# Patient Record
Sex: Male | Born: 1935 | Race: White | Hispanic: No | Smoking: Former smoker
Health system: Southern US, Community
[De-identification: ages and names within clinical notes are randomized; demographics above are authoritative.]

## PROBLEM LIST (undated history)

## (undated) DIAGNOSIS — E785 Hyperlipidemia, unspecified: Secondary | ICD-10-CM

## (undated) DIAGNOSIS — F419 Anxiety disorder, unspecified: Secondary | ICD-10-CM

## (undated) DIAGNOSIS — Z87442 Personal history of urinary calculi: Secondary | ICD-10-CM

## (undated) DIAGNOSIS — F32A Depression, unspecified: Secondary | ICD-10-CM

## (undated) DIAGNOSIS — I10 Essential (primary) hypertension: Secondary | ICD-10-CM

## (undated) DIAGNOSIS — F329 Major depressive disorder, single episode, unspecified: Secondary | ICD-10-CM

## (undated) DIAGNOSIS — I6529 Occlusion and stenosis of unspecified carotid artery: Secondary | ICD-10-CM

## (undated) HISTORY — PX: ROTATOR CUFF REPAIR: SHX139

## (undated) HISTORY — DX: Major depressive disorder, single episode, unspecified: F32.9

## (undated) HISTORY — DX: Hyperlipidemia, unspecified: E78.5

## (undated) HISTORY — DX: Anxiety disorder, unspecified: F41.9

## (undated) HISTORY — DX: Depression, unspecified: F32.A

## (undated) HISTORY — PX: ELBOW ARTHROSCOPY: SUR87

## (undated) HISTORY — DX: Essential (primary) hypertension: I10

---

## 1998-12-15 ENCOUNTER — Encounter: Payer: Self-pay | Admitting: Emergency Medicine

## 1998-12-15 ENCOUNTER — Emergency Department (HOSPITAL_COMMUNITY): Admission: EM | Admit: 1998-12-15 | Discharge: 1998-12-15 | Payer: Self-pay | Admitting: *Deleted

## 1998-12-17 ENCOUNTER — Encounter: Payer: Self-pay | Admitting: Plastic Surgery

## 1998-12-19 ENCOUNTER — Ambulatory Visit (HOSPITAL_COMMUNITY): Admission: RE | Admit: 1998-12-19 | Discharge: 1998-12-20 | Payer: Self-pay | Admitting: *Deleted

## 1999-05-17 ENCOUNTER — Encounter: Payer: Self-pay | Admitting: Orthopedic Surgery

## 1999-05-17 ENCOUNTER — Encounter: Admission: RE | Admit: 1999-05-17 | Discharge: 1999-05-17 | Payer: Self-pay | Admitting: Orthopedic Surgery

## 1999-07-10 ENCOUNTER — Encounter: Admission: RE | Admit: 1999-07-10 | Discharge: 1999-07-10 | Payer: Self-pay | Admitting: Emergency Medicine

## 1999-07-10 ENCOUNTER — Encounter: Payer: Self-pay | Admitting: Emergency Medicine

## 1999-07-12 ENCOUNTER — Ambulatory Visit (HOSPITAL_COMMUNITY): Admission: RE | Admit: 1999-07-12 | Discharge: 1999-07-12 | Payer: Self-pay | Admitting: Emergency Medicine

## 1999-07-17 ENCOUNTER — Encounter: Payer: Self-pay | Admitting: Emergency Medicine

## 1999-07-17 ENCOUNTER — Encounter: Admission: RE | Admit: 1999-07-17 | Discharge: 1999-07-17 | Payer: Self-pay | Admitting: Emergency Medicine

## 2000-03-07 ENCOUNTER — Emergency Department (HOSPITAL_COMMUNITY): Admission: EM | Admit: 2000-03-07 | Discharge: 2000-03-08 | Payer: Self-pay | Admitting: Emergency Medicine

## 2002-01-10 ENCOUNTER — Ambulatory Visit (HOSPITAL_COMMUNITY): Admission: RE | Admit: 2002-01-10 | Discharge: 2002-01-10 | Payer: Self-pay | Admitting: Emergency Medicine

## 2004-01-04 ENCOUNTER — Ambulatory Visit (HOSPITAL_BASED_OUTPATIENT_CLINIC_OR_DEPARTMENT_OTHER): Admission: RE | Admit: 2004-01-04 | Discharge: 2004-01-04 | Payer: Self-pay | Admitting: Orthopaedic Surgery

## 2004-01-04 ENCOUNTER — Ambulatory Visit (HOSPITAL_COMMUNITY): Admission: RE | Admit: 2004-01-04 | Discharge: 2004-01-04 | Payer: Self-pay | Admitting: Orthopaedic Surgery

## 2004-07-09 ENCOUNTER — Encounter: Admission: RE | Admit: 2004-07-09 | Discharge: 2004-07-09 | Payer: Self-pay | Admitting: Emergency Medicine

## 2004-07-22 ENCOUNTER — Encounter: Admission: RE | Admit: 2004-07-22 | Discharge: 2004-07-22 | Payer: Self-pay | Admitting: Emergency Medicine

## 2004-08-05 ENCOUNTER — Encounter: Admission: RE | Admit: 2004-08-05 | Discharge: 2004-08-05 | Payer: Self-pay | Admitting: Emergency Medicine

## 2004-12-30 ENCOUNTER — Encounter: Admission: RE | Admit: 2004-12-30 | Discharge: 2004-12-30 | Payer: Self-pay | Admitting: Emergency Medicine

## 2005-02-14 HISTORY — PX: INGUINAL HERNIA REPAIR: SUR1180

## 2005-03-06 ENCOUNTER — Encounter (INDEPENDENT_AMBULATORY_CARE_PROVIDER_SITE_OTHER): Payer: Self-pay | Admitting: Specialist

## 2005-03-06 ENCOUNTER — Ambulatory Visit (HOSPITAL_COMMUNITY): Admission: RE | Admit: 2005-03-06 | Discharge: 2005-03-06 | Payer: Self-pay | Admitting: Surgery

## 2005-05-01 ENCOUNTER — Encounter: Admission: RE | Admit: 2005-05-01 | Discharge: 2005-05-01 | Payer: Self-pay | Admitting: Emergency Medicine

## 2007-04-17 ENCOUNTER — Encounter: Admission: RE | Admit: 2007-04-17 | Discharge: 2007-04-17 | Payer: Self-pay | Admitting: Emergency Medicine

## 2007-04-21 ENCOUNTER — Encounter: Admission: RE | Admit: 2007-04-21 | Discharge: 2007-04-21 | Payer: Self-pay | Admitting: Emergency Medicine

## 2007-05-12 ENCOUNTER — Encounter: Admission: RE | Admit: 2007-05-12 | Discharge: 2007-05-12 | Payer: Self-pay | Admitting: Emergency Medicine

## 2007-11-03 ENCOUNTER — Encounter: Admission: RE | Admit: 2007-11-03 | Discharge: 2007-11-03 | Payer: Self-pay | Admitting: Emergency Medicine

## 2007-11-17 ENCOUNTER — Encounter: Admission: RE | Admit: 2007-11-17 | Discharge: 2007-11-17 | Payer: Self-pay | Admitting: Emergency Medicine

## 2007-12-01 ENCOUNTER — Encounter: Admission: RE | Admit: 2007-12-01 | Discharge: 2007-12-01 | Payer: Self-pay | Admitting: Emergency Medicine

## 2007-12-16 ENCOUNTER — Emergency Department (HOSPITAL_COMMUNITY): Admission: EM | Admit: 2007-12-16 | Discharge: 2007-12-16 | Payer: Self-pay | Admitting: Emergency Medicine

## 2008-07-14 ENCOUNTER — Encounter: Admission: RE | Admit: 2008-07-14 | Discharge: 2008-07-14 | Payer: Self-pay | Admitting: Surgery

## 2010-02-07 ENCOUNTER — Ambulatory Visit (HOSPITAL_BASED_OUTPATIENT_CLINIC_OR_DEPARTMENT_OTHER): Admission: RE | Admit: 2010-02-07 | Discharge: 2010-02-07 | Payer: Self-pay | Admitting: Urology

## 2010-02-14 HISTORY — PX: LUNG REMOVAL, PARTIAL: SHX233

## 2010-02-27 ENCOUNTER — Ambulatory Visit: Payer: Self-pay | Admitting: Thoracic Surgery

## 2010-03-04 ENCOUNTER — Ambulatory Visit (HOSPITAL_COMMUNITY): Admission: RE | Admit: 2010-03-04 | Discharge: 2010-03-04 | Payer: Self-pay | Admitting: Thoracic Surgery

## 2010-03-05 ENCOUNTER — Ambulatory Visit (HOSPITAL_COMMUNITY): Admission: RE | Admit: 2010-03-05 | Discharge: 2010-03-05 | Payer: Self-pay | Admitting: Thoracic Surgery

## 2010-03-05 ENCOUNTER — Ambulatory Visit: Payer: Self-pay | Admitting: Thoracic Surgery

## 2010-03-05 ENCOUNTER — Encounter: Admission: RE | Admit: 2010-03-05 | Discharge: 2010-03-05 | Payer: Self-pay | Admitting: Thoracic Surgery

## 2010-03-12 ENCOUNTER — Ambulatory Visit: Payer: Self-pay | Admitting: Thoracic Surgery

## 2010-03-12 ENCOUNTER — Inpatient Hospital Stay (HOSPITAL_COMMUNITY): Admission: RE | Admit: 2010-03-12 | Discharge: 2010-03-18 | Payer: Self-pay | Admitting: Thoracic Surgery

## 2010-03-12 ENCOUNTER — Encounter: Payer: Self-pay | Admitting: Thoracic Surgery

## 2010-03-13 ENCOUNTER — Ambulatory Visit: Payer: Self-pay | Admitting: Infectious Disease

## 2010-03-26 ENCOUNTER — Telehealth: Payer: Self-pay | Admitting: Infectious Disease

## 2010-03-27 ENCOUNTER — Ambulatory Visit: Payer: Self-pay | Admitting: Infectious Disease

## 2010-03-27 DIAGNOSIS — N433 Hydrocele, unspecified: Secondary | ICD-10-CM

## 2010-03-27 DIAGNOSIS — B459 Cryptococcosis, unspecified: Secondary | ICD-10-CM

## 2010-03-27 DIAGNOSIS — I10 Essential (primary) hypertension: Secondary | ICD-10-CM | POA: Insufficient documentation

## 2010-03-27 LAB — CONVERTED CEMR LAB
Alkaline Phosphatase: 75 units/L (ref 39–117)
BUN: 25 mg/dL — ABNORMAL HIGH (ref 6–23)
Basophils Relative: 1 % (ref 0–1)
Eosinophils Absolute: 0.6 10*3/uL (ref 0.0–0.7)
Glucose, Bld: 143 mg/dL — ABNORMAL HIGH (ref 70–99)
Hemoglobin: 11.2 g/dL — ABNORMAL LOW (ref 13.0–17.0)
Lymphs Abs: 1.6 10*3/uL (ref 0.7–4.0)
MCHC: 31.8 g/dL (ref 30.0–36.0)
MCV: 86.5 fL (ref 78.0–100.0)
Monocytes Absolute: 0.7 10*3/uL (ref 0.1–1.0)
Monocytes Relative: 7 % (ref 3–12)
Neutro Abs: 6.7 10*3/uL (ref 1.7–7.7)
RBC: 4.07 M/uL — ABNORMAL LOW (ref 4.22–5.81)
Total Bilirubin: 0.4 mg/dL (ref 0.3–1.2)
WBC: 9.7 10*3/uL (ref 4.0–10.5)

## 2010-03-28 ENCOUNTER — Encounter: Payer: Self-pay | Admitting: Infectious Disease

## 2010-04-02 ENCOUNTER — Ambulatory Visit: Payer: Self-pay | Admitting: Thoracic Surgery

## 2010-04-02 ENCOUNTER — Encounter: Admission: RE | Admit: 2010-04-02 | Discharge: 2010-04-02 | Payer: Self-pay | Admitting: Thoracic Surgery

## 2010-05-13 ENCOUNTER — Encounter: Admission: RE | Admit: 2010-05-13 | Discharge: 2010-05-13 | Payer: Self-pay | Admitting: Thoracic Surgery

## 2010-05-14 ENCOUNTER — Ambulatory Visit: Payer: Self-pay | Admitting: Thoracic Surgery

## 2010-05-20 ENCOUNTER — Encounter: Payer: Self-pay | Admitting: Infectious Disease

## 2010-05-27 ENCOUNTER — Encounter: Payer: Self-pay | Admitting: Infectious Disease

## 2010-05-30 ENCOUNTER — Ambulatory Visit: Payer: Self-pay | Admitting: Infectious Disease

## 2010-05-30 ENCOUNTER — Encounter: Payer: Self-pay | Admitting: Infectious Disease

## 2010-05-30 DIAGNOSIS — N401 Enlarged prostate with lower urinary tract symptoms: Secondary | ICD-10-CM

## 2010-05-30 LAB — CONVERTED CEMR LAB
Albumin: 4.4 g/dL (ref 3.5–5.2)
Alkaline Phosphatase: 67 units/L (ref 39–117)
BUN: 23 mg/dL (ref 6–23)
Basophils Absolute: 0 10*3/uL (ref 0.0–0.1)
Basophils Relative: 0 % (ref 0–1)
CO2: 29 meq/L (ref 19–32)
Glucose, Bld: 152 mg/dL — ABNORMAL HIGH (ref 70–99)
Hemoglobin: 11 g/dL — ABNORMAL LOW (ref 13.0–17.0)
Lymphocytes Relative: 28 % (ref 12–46)
MCHC: 31 g/dL (ref 30.0–36.0)
Monocytes Absolute: 0.7 10*3/uL (ref 0.1–1.0)
Neutro Abs: 4.1 10*3/uL (ref 1.7–7.7)
Neutrophils Relative %: 58 % (ref 43–77)
PSA, Free Pct: 28 (ref >25)
PSA, Free: 0.7 ng/mL
PSA: 2.5 ng/mL (ref <=4.00)
Platelets: 276 10*3/uL (ref 150–400)
Potassium: 4.2 meq/L (ref 3.5–5.3)
RDW: 15 % (ref 11.5–15.5)
Total Protein: 7.4 g/dL (ref 6.0–8.3)

## 2010-06-14 ENCOUNTER — Encounter: Payer: Self-pay | Admitting: Infectious Disease

## 2010-07-06 ENCOUNTER — Encounter: Payer: Self-pay | Admitting: Thoracic Surgery

## 2010-07-09 ENCOUNTER — Encounter
Admission: RE | Admit: 2010-07-09 | Discharge: 2010-07-09 | Payer: Self-pay | Source: Home / Self Care | Attending: Thoracic Surgery | Admitting: Thoracic Surgery

## 2010-07-10 ENCOUNTER — Ambulatory Visit
Admission: RE | Admit: 2010-07-10 | Discharge: 2010-07-10 | Payer: Self-pay | Source: Home / Self Care | Attending: Thoracic Surgery | Admitting: Thoracic Surgery

## 2010-07-10 NOTE — Assessment & Plan Note (Addendum)
OFFICE VISIT  SHILOH, SWOPES DOB:  26-Jan-1934                                        July 10, 2010 CHART #:  [id number]  The patient came today and his chest x-ray is stable.  He is now 5 months since we resected the right upper lobe for histoplasmosis nodule. He is still on medication through Dr. Daiva Eves.  He is doing well overall.  We will release him back to his medical doctor and Dr. Daiva Eves and we will see him again if he has any future problems.  His blood pressure was 154/84, pulse 80, respirations 18, saturations were 98%. He is on Diflucan 200 mg a day.  Ines Bloomer, M.D. Electronically Signed  DPB/MEDQ  D:  07/10/2010  T:  07/10/2010  Job:  [id number]

## 2010-07-18 NOTE — Consult Note (Signed)
Summary: [facility]  [facility]   Imported By: Florinda Marker 06/26/2010 16:10:32  _____________________________________________________________________  External Attachment:    Type:   Image     Comment:   External Document

## 2010-07-18 NOTE — Progress Notes (Signed)
Summary: Problems with meds given at hosp d/c.  Phone Note Call from Patient   Summary of Call: Having problems getting medications given at discharge.  They are expensive.   Please call.  .sign Initial call taken by: Tomasita Morrow RN,  March 26, 2010 10:18 AM  Follow-up for Phone Call        Well, he really needs meds. I called and left a message for him and will try to call him back. He needs antifungal therapy for a year. I could try high dose fluconazole but this would be inferior to itraconazole if this is histoplasma and it would not be much cheaper. He may be getting brand instead of generic. Follow-up by: Acey Lav MD,  March 26, 2010 10:34 AM  Additional Follow-up for Phone Call Additional follow up Details #1::        this poor gentleman just forked out greater than 700 dollars to pay for his itraconazole. Medicare does not appear to have covered ANY of the cost. I have no idea what his pharmacist did, but doesnt appear to have  done his job since no one made appeal to medicare etc. Can we get a case manager to help this gentleman out tomorrow. ONly other way I could see this happening is if he fell into the 'doughtnut hole.". Tammy any case manger in particular you would recommend? Additional Follow-up by: Acey Lav MD,  March 26, 2010 10:46 AM  New Problems: HISTOPLASMOSIS (ICD-115.90)   New Problems: HISTOPLASMOSIS (ICD-115.90) New/Updated Medications: ITRACONAZOLE 100 MG CAPS (ITRACONAZOLE) take two tablets twice daily until instructed by Dr. Daiva Eves Prescriptions: ITRACONAZOLE 100 MG CAPS (ITRACONAZOLE) take two tablets twice daily until instructed by Dr. Daiva Eves  #120 x 11   Entered and Authorized by:   Acey Lav MD   Signed by:   Paulette Blanch Dam MD on 03/26/2010   Method used:   Print then Give to Patient   RxID:   [id number]

## 2010-07-18 NOTE — Miscellaneous (Signed)
Summary: HIPAA Restrictions  HIPAA Restrictions   Imported By: Florinda Marker 03/28/2010 09:03:49  _____________________________________________________________________  External Attachment:    Type:   Image     Comment:   External Document

## 2010-07-18 NOTE — Assessment & Plan Note (Signed)
Summary: hsfu/need chart/histoplasmosis   Visit Type:  Follow-up Referring Samie Reasons:  Karle Plumber  CC:  hsfu .  History of Present Illness: 77 year old man with PMHx sigfnican for r hypertension and hydrocele.  He underwent surgery by Dr. Annabell Howells and on preoperatiev screening was found to have a  ight apical lung nodule that was   new, compared to examination in 2009.  This was followed by a PET scan which showed a hypermetabolic lesion in the right upper lobe with progressive cavitization concerning forcarcinoma.  There were  additional other smaller pulmonary nodules that had been seen on a CT   scan as well.  Pt underwent VATS by Dr. Edwyna Shell who encountered what appeared to be lung abscess in the RUL, he resected this area and 2nd nodule. On pathology no malignancy found but instead necrotizing granulomatous inflammtion with yeast seen on pathology thought to be highly consistent with histoplasmosis (they were going to preserve slides for future reference). Pt was started on itraconazole 200mg  three times a day x3 days and then intraconzole 200mg  two times a day. Unfortunately he never enrolled in [employer] plan and paid out of pocket $800 dollars for one month supply of itraconazole. 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 He has had alll chest tubes removed and feels much better. He still has stiches ans asks for these to be removed. I spent greater than 45 minutes on this pt including greater than 50% of time face to face counselling the pt and coordinating care.   Problems Prior to Update: 1)  Histoplasmosis  (ICD-115.90)  Medications Prior to Update: 1)  Itraconazole 100 Mg Caps (Itraconazole) .... Take Two Tablets Twice Daily Until Instructed By Dr. Daiva Eves  Current Medications (verified): 1)  Itraconazole 100 Mg Caps (Itraconazole) .... Take Two Tablets Twice Daily Until Instructed By Dr. Daiva Eves 2)  Prinivil 20 Mg Tabs  (Lisinopril) .... Take Two Tablets Daily 3)  Hydrochlorothiazide 25 Mg Tabs (Hydrochlorothiazide) .... One Half Tablet Once Daily 4)  Fluconazole 200 Mg Tabs (Fluconazole) .... Take Two Tablets Once Daily  Allergies (verified): No Known Drug Allergies    Current Allergies (reviewed today): No known allergies  Past History:  Past Medical History:  Hypertension.   Hydrocele Crytpococcal lung abscess     Past Surgical History: :  Resection of the hydrocele on VATS recently.   Social History:   Married 2nd wife. Five children.  Retired IT sales professional.  No   alcohol.  Quit smoking in 1965.   Review of Systems  The patient denies anorexia, fever, weight loss, weight gain, vision loss, decreased hearing, hoarseness, chest pain, syncope, dyspnea on exertion, peripheral edema, prolonged cough, headaches, hemoptysis, abdominal pain, melena, hematochezia, severe indigestion/heartburn, hematuria, incontinence, genital sores, muscle weakness, suspicious skin lesions, transient blindness, difficulty walking, depression, unusual weight change, abnormal bleeding, and enlarged lymph nodes.    Vital Signs:  Patient profile:   77 year old male Height:      70 inches (177.80 cm) Weight:      178.50 pounds (81.14 kg) BMI:     25.70 Pulse rate:   89 / minute BP sitting:   145 / 78  (left arm)  Vitals Entered By: Starleen Arms CMA (March 27, 2010 3:43 PM) CC: hsfu  Is Patient Diabetic? No Pain Assessment Patient in pain? no      Nutritional Status BMI of 25 - 29 = overweight Nutritional Status Detail nl  Does patient need assistance? Functional Status Self care Ambulation Normal   Physical Exam  General:  alert, well-developed, and well-hydrated.   Head:  normocephalic and atraumatic.   Eyes:  vision grossly intact, pupils equal, pupils round, and pupils reactive to light.   Ears:  no external deformities.   Nose:  no external deformity and no external erythema.   Mouth:   pharynx pink and moist, no erythema, and no exudates.   Neck:  supple and full ROM.   Lungs:  normal respiratory effort.  diminished breath sounds at the right base Heart:  normal rate, regular rhythm, no murmur, and no gallop.   Abdomen:  soft, non-tender, and no distention.   Msk:  normal ROM and no joint deformities.   Extremities:  trace left pedal edema and trace right pedal edema.   Neurologic:  alert & oriented X3 and gait normal.   Skin:  pts srugical sites cdi, sutures removed from two of these Psych:  Oriented X3, memory intact for recent and remote, and normally interactive.     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 Rtc in December, check lfts, cbc and crypto ag serum. Orders: T-CMP with Estimated GFR ([id number]) T-CBC w/Diff ([id number]) T- * Misc. Laboratory test [phone]) Est. Patient Level V (62952)  Problem # 2:  ESSENTIAL HYPERTENSION (ICD-401.9)  resaonble control His updated medication list for this problem includes:    Prinivil 20 Mg Tabs (Lisinopril) .Marland Kitchen... Take two tablets daily    Hydrochlorothiazide 25 Mg Tabs (Hydrochlorothiazide) ..... One half tablet once daily  Orders: Est. Patient Level V (84132)  Problem # 3:  HYDROCELE (ICD-603.9)  sp surgery by Dr. Annabell Howells  Orders: Est. Patient Level V [phone])  Medications Added to Medication List This Visit: 1)  Prinivil 20 Mg Tabs (Lisinopril) .... Take two tablets daily 2)  Hydrochlorothiazide 25 Mg Tabs (Hydrochlorothiazide) .... One half tablet once daily 3)  Fluconazole 200 Mg Tabs (Fluconazole) .... Take two tablets once daily  Patient Instructions: 1)  Please schedule a follow-up appointment in December. Prescriptions: FLUCONAZOLE 200 MG TABS (FLUCONAZOLE) take two tablets once daily  #60 x 11   Entered and  Authorized by:   Acey Lav MD   Signed by:   Paulette Blanch Dam MD on 03/27/2010   Method used:   Print then Give to Patient   RxID:   [id number]   Appended Document: Orders Update    Clinical Lists Changes  Medications: Removed medication of ITRACONAZOLE 100 MG CAPS (ITRACONAZOLE) take two tablets twice daily until instructed by Dr. Daiva Eves

## 2010-07-18 NOTE — Consult Note (Signed)
Summary: [facility]  [facility]   Imported By: Florinda Marker 06/04/2010 09:24:46  _____________________________________________________________________  External Attachment:    Type:   Image     Comment:   External Document

## 2010-07-18 NOTE — Assessment & Plan Note (Signed)
Summary: 68month f/u/vs   Visit Type:  Follow-up Referring Provider:  Karle Plumber  CC:  follow-up visit.  History of Present Illness: 77 year old man with PMHx sigfnican for r hypertension and hydrocele.  He underwent surgery by Dr. Annabell Howells and on preoperatiev screening was found to have a  ight apical lung nodule that was   new, compared to examination in 2009.  This was followed by a PET scan which showed a hypermetabolic lesion in the right upper lobe with progressive cavitization concerning forcarcinoma.  There were  additional other smaller pulmonary nodules that had been seen on a CT   scan as well.  Pt underwent VATS by Dr. Edwyna Shell who encountered what appeared to be lung abscess in the RUL, he resected this area and 2nd nodule. On pathology no malignancy found but instead necrotizing granulomatous inflammtion with yeast seen on pathology thought to be highly consistent with histoplasmosis--in actual fact he grew cryptococcus. I changed him to fluconazole at the last appt. He has done well. He did have more coughing than usual in the past few weeks. He adn his  wife attribute this to a "Himalayan salt candle" She bought for him to improve his breathing.This has abateed recently. He is without fevers chills or systemic symptoms. He ahs noticed diminished appetite sicne starting the fluconazole Dr> Annabell Howells  Problems Prior to Update: 1)  Hydrocele  (ICD-603.9) 2)  Essential Hypertension  (ICD-401.9) 3)  Cryptococcosis  (ICD-117.5)  Medications Prior to Update: 1)  Prinivil 20 Mg Tabs (Lisinopril) .... Take Two Tablets Daily 2)  Hydrochlorothiazide 25 Mg Tabs (Hydrochlorothiazide) .... One Half Tablet Once Daily 3)  Fluconazole 200 Mg Tabs (Fluconazole) .... Take Two Tablets Once Daily  Current Medications (verified): 1)  Prinivil 20 Mg Tabs (Lisinopril) .... Take Two Tablets Daily 2)  Hydrochlorothiazide 25 Mg Tabs (Hydrochlorothiazide) .... One Half Tablet Once Daily 3)  Fluconazole 200  Mg Tabs (Fluconazole) .... Take Two Tablets Once Daily  Allergies (verified): No Known Drug Allergies   Preventive Screening-Counseling & Management  Alcohol-Tobacco     Alcohol drinks/day: 0     Smoking Status: never  Caffeine-Diet-Exercise     Caffeine use/day: coffee, soda 2 per day     Does Patient Exercise: no     Type of exercise: at work  [employer] Use: yes   Current Allergies (reviewed today): No known allergies  Past History:  Past Medical History: Last updated: 03/27/2010  Hypertension.   Hydrocele Crytpococcal lung abscess     Past Surgical History: Last updated: 03/27/2010 :  Resection of the hydrocele on VATS recently.   Social History: Last updated: 03/27/2010   Married 2nd wife. Five children.  Retired IT sales professional.  No   alcohol.  Quit smoking in 1965.   Risk Factors: Alcohol Use: 0 (05/30/2010) Caffeine Use: coffee, soda 2 per day (05/30/2010) Exercise: no (05/30/2010)  Risk Factors: Smoking Status: never (05/30/2010)  Family History: Reviewed history and no changes required.  Social History: Reviewed history from 03/27/2010 and no changes required.   Married 2nd wife. Five children.  Retired IT sales professional.  No   alcohol.  Quit smoking in 1965.   Review of Systems  The patient denies anorexia, fever, weight loss, weight gain, vision loss, decreased hearing, hoarseness, chest pain, syncope, dyspnea on exertion, peripheral edema, prolonged cough, headaches, hemoptysis, abdominal pain, melena, hematochezia, severe indigestion/heartburn, hematuria, incontinence, genital sores, muscle weakness, suspicious skin lesions, transient blindness, difficulty walking, depression, unusual weight change,  and abnormal bleeding.    Vital Signs:  Patient profile:   77 year old male Height:      70 inches (177.80 cm) Weight:      174.8 pounds (79.45 kg) BMI:     25.17 Temp:     98.3 degrees F (36.83 degrees C) oral Pulse rate:    85 / minute BP sitting:   160 / 78  (right arm)  Vitals Entered By: Wendall Mola CMA Duncan Dull) (May 30, 2010 3:15 PM) CC: follow-up visit Is Patient Diabetic? No Pain Assessment Patient in pain? no      Nutritional Status BMI of 25 - 29 = overweight Nutritional Status Detail appetite "not good"  Have you ever been in a relationship where you felt threatened, hurt or afraid?Unable to ask   Does patient need assistance? Functional Status Self care Ambulation Normal Comments no missed doses of meds per pt.   Physical Exam  General:  alert, well-developed, and well-hydrated.   Head:  normocephalic and atraumatic.   Eyes:  vision grossly intact, pupils equal, pupils round, and pupils reactive to light.   Ears:  no external deformities.   Nose:  no external deformity and no external erythema.   Mouth:  pharynx pink and moist, no erythema, and no exudates.   Neck:  supple and full ROM.   Lungs:  normal respiratory effort.  diminished breath sounds at the right base Heart:  normal rate, regular rhythm, no murmur, and no gallop.   Abdomen:  soft, non-tender, and no distention.   Msk:  normal ROM and no joint deformities.   Extremities:  trace left pedal edema and trace right pedal edema.   Neurologic:  alert & oriented X3 and gait normal.   Skin:  pts srugical sites cdi,and stes are clean Psych:  Oriented X3, memory intact for recent and remote, and normally interactive.     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 Patient Level IV (29562)  Problem # 2:  ESSENTIAL HYPERTENSION (ICD-401.9)  BP up likely dy from bit of white coat htn His updated medication list for this problem includes:    Prinivil 20 Mg Tabs (Lisinopril) .Marland Kitchen... Take two tablets daily    Hydrochlorothiazide 25 Mg Tabs  (Hydrochlorothiazide) ..... One half tablet once daily  His updated medication list for this problem includes:    Prinivil 20 Mg Tabs (Lisinopril) .Marland Kitchen... Take two tablets daily    Hydrochlorothiazide 25 Mg Tabs (Hydrochlorothiazide) ..... One half tablet once daily  Orders: Est. Patient Level IV (13086)  Problem # 3:  BENIGN PROSTATIC HYPERTROPHY, WITH OBSTRUCTION (ICD-600.01) I will check psa and free psa for his Urologist and fax to them Orders: T-PSA Total ([id number]) T-PSA Free ([id number]) Est. Patient Level IV (01027)  Patient Instructions: 1)  rtc in 6 months time

## 2010-07-18 NOTE — Consult Note (Signed)
Summary: [facility]  [facility]   Imported By: Florinda Marker 07/02/2010 12:19:30  _____________________________________________________________________  External Attachment:    Type:   Image     Comment:   External Document

## 2010-08-23 ENCOUNTER — Encounter: Payer: Self-pay | Admitting: Infectious Disease

## 2010-08-29 LAB — AFB CULTURE WITH SMEAR (NOT AT ARMC): Acid Fast Smear: NONE SEEN

## 2010-08-29 LAB — TYPE AND SCREEN

## 2010-08-29 LAB — SURGICAL PCR SCREEN
MRSA, PCR: NEGATIVE
Staphylococcus aureus: POSITIVE — AB

## 2010-08-29 LAB — POCT I-STAT 7, (LYTES, BLD GAS, ICA,H+H)
Bicarbonate: 24.5 mEq/L — ABNORMAL HIGH (ref 20.0–24.0)
HCT: 29 % — ABNORMAL LOW (ref 39.0–52.0)
Hemoglobin: 9.9 g/dL — ABNORMAL LOW (ref 13.0–17.0)
Patient temperature: 35
Sodium: 134 mEq/L — ABNORMAL LOW (ref 135–145)
TCO2: 26 mmol/L (ref 0–100)
pCO2 arterial: 43.7 mmHg (ref 35.0–45.0)
pH, Arterial: 7.346 — ABNORMAL LOW (ref 7.350–7.450)
pO2, Arterial: 416 mmHg — ABNORMAL HIGH (ref 80.0–100.0)

## 2010-08-29 LAB — BASIC METABOLIC PANEL
CO2: 22 mEq/L (ref 19–32)
CO2: 23 mEq/L (ref 19–32)
CO2: 26 mEq/L (ref 19–32)
Calcium: 7.7 mg/dL — ABNORMAL LOW (ref 8.4–10.5)
Calcium: 7.9 mg/dL — ABNORMAL LOW (ref 8.4–10.5)
Chloride: 101 mEq/L (ref 96–112)
Creatinine, Ser: 0.94 mg/dL (ref 0.4–1.5)
Creatinine, Ser: 0.94 mg/dL (ref 0.4–1.5)
Creatinine, Ser: 1.02 mg/dL (ref 0.4–1.5)
GFR calc Af Amer: >60 mL/min (ref >60)
GFR calc Af Amer: >60 mL/min (ref >60)
GFR calc Af Amer: >60 mL/min (ref >60)
GFR calc non Af Amer: >60 mL/min (ref >60)
GFR calc non Af Amer: >60 mL/min (ref >60)
Glucose, Bld: 186 mg/dL — ABNORMAL HIGH (ref 70–99)
Sodium: 135 mEq/L (ref 135–145)
Sodium: 135 mEq/L (ref 135–145)
Sodium: 136 mEq/L (ref 135–145)

## 2010-08-29 LAB — POCT I-STAT 3, ART BLOOD GAS (G3+)
Patient temperature: 98.1
pCO2 arterial: 37.9 mmHg (ref 35.0–45.0)
pH, Arterial: 7.383 (ref 7.350–7.450)

## 2010-08-29 LAB — COMPREHENSIVE METABOLIC PANEL
ALT: 24 U/L (ref 0–53)
ALT: 26 U/L (ref 0–53)
AST: 31 U/L (ref 0–37)
Alkaline Phosphatase: 36 U/L — ABNORMAL LOW (ref 39–117)
CO2: 26 mEq/L (ref 19–32)
Calcium: 7.9 mg/dL — ABNORMAL LOW (ref 8.4–10.5)
Calcium: 9.5 mg/dL (ref 8.4–10.5)
Chloride: 101 mEq/L (ref 96–112)
Creatinine, Ser: 1.07 mg/dL (ref 0.4–1.5)
GFR calc Af Amer: >60 mL/min (ref >60)
GFR calc non Af Amer: >60 mL/min (ref >60)
Glucose, Bld: 173 mg/dL — ABNORMAL HIGH (ref 70–99)
Potassium: 3.9 mEq/L (ref 3.5–5.1)
Sodium: 131 mEq/L — ABNORMAL LOW (ref 135–145)
Sodium: 138 mEq/L (ref 135–145)
Total Bilirubin: 1.1 mg/dL (ref 0.3–1.2)
Total Protein: 6.6 g/dL (ref 6.0–8.3)

## 2010-08-29 LAB — BLOOD GAS, ARTERIAL
FIO2: 0.21 %
Patient temperature: 98.6
TCO2: 24.9 mmol/L (ref 0–100)
pH, Arterial: 7.432 (ref 7.350–7.450)

## 2010-08-29 LAB — GLUCOSE, CAPILLARY
Glucose-Capillary: 116 mg/dL — ABNORMAL HIGH (ref 70–99)
Glucose-Capillary: 132 mg/dL — ABNORMAL HIGH (ref 70–99)
Glucose-Capillary: 137 mg/dL — ABNORMAL HIGH (ref 70–99)
Glucose-Capillary: 139 mg/dL — ABNORMAL HIGH (ref 70–99)
Glucose-Capillary: 141 mg/dL — ABNORMAL HIGH (ref 70–99)
Glucose-Capillary: 146 mg/dL — ABNORMAL HIGH (ref 70–99)
Glucose-Capillary: 146 mg/dL — ABNORMAL HIGH (ref 70–99)
Glucose-Capillary: 148 mg/dL — ABNORMAL HIGH (ref 70–99)
Glucose-Capillary: 154 mg/dL — ABNORMAL HIGH (ref 70–99)
Glucose-Capillary: 163 mg/dL — ABNORMAL HIGH (ref 70–99)
Glucose-Capillary: 168 mg/dL — ABNORMAL HIGH (ref 70–99)
Glucose-Capillary: 170 mg/dL — ABNORMAL HIGH (ref 70–99)
Glucose-Capillary: 195 mg/dL — ABNORMAL HIGH (ref 70–99)
Glucose-Capillary: 224 mg/dL — ABNORMAL HIGH (ref 70–99)

## 2010-08-29 LAB — PROTIME-INR
INR: 0.98 (ref 0.00–1.49)
Prothrombin Time: 13.2 seconds (ref 11.6–15.2)

## 2010-08-29 LAB — CBC
HCT: 32.8 % — ABNORMAL LOW (ref 39.0–52.0)
HCT: 37.1 % — ABNORMAL LOW (ref 39.0–52.0)
Hemoglobin: 10.1 g/dL — ABNORMAL LOW (ref 13.0–17.0)
Hemoglobin: 10.2 g/dL — ABNORMAL LOW (ref 13.0–17.0)
Hemoglobin: 11.9 g/dL — ABNORMAL LOW (ref 13.0–17.0)
MCH: 27.1 pg (ref 26.0–34.0)
MCH: 28.5 pg (ref 26.0–34.0)
MCH: 28.8 pg (ref 26.0–34.0)
MCH: 29.2 pg (ref 26.0–34.0)
MCHC: 32.1 g/dL (ref 30.0–36.0)
MCHC: 34.2 g/dL (ref 30.0–36.0)
MCV: 83 fL (ref 78.0–100.0)
MCV: 85.4 fL (ref 78.0–100.0)
Platelets: 157 10*3/uL (ref 150–400)
Platelets: 159 10*3/uL (ref 150–400)
Platelets: 216 10*3/uL (ref 150–400)
Platelets: 243 10*3/uL (ref 150–400)
RBC: 3.49 MIL/uL — ABNORMAL LOW (ref 4.22–5.81)
RBC: 3.53 MIL/uL — ABNORMAL LOW (ref 4.22–5.81)
RBC: 3.85 MIL/uL — ABNORMAL LOW (ref 4.22–5.81)
RBC: 3.95 MIL/uL — ABNORMAL LOW (ref 4.22–5.81)
RBC: 4.17 MIL/uL — ABNORMAL LOW (ref 4.22–5.81)
RDW: 14.9 % (ref 11.5–15.5)
WBC: 12.6 10*3/uL — ABNORMAL HIGH (ref 4.0–10.5)
WBC: 18.5 10*3/uL — ABNORMAL HIGH (ref 4.0–10.5)
WBC: 24.4 10*3/uL — ABNORMAL HIGH (ref 4.0–10.5)
WBC: 9.2 10*3/uL (ref 4.0–10.5)

## 2010-08-29 LAB — TISSUE CULTURE
Culture: NO GROWTH
Gram Stain: NONE SEEN

## 2010-08-29 LAB — FUNGUS CULTURE W SMEAR

## 2010-08-29 LAB — URINALYSIS, ROUTINE W REFLEX MICROSCOPIC
Bilirubin Urine: NEGATIVE
Ketones, ur: NEGATIVE mg/dL
Nitrite: NEGATIVE
Protein, ur: NEGATIVE mg/dL
Specific Gravity, Urine: 1.021 (ref 1.005–1.030)
Urobilinogen, UA: 0.2 mg/dL (ref 0.0–1.0)

## 2010-08-29 LAB — CULTURE, ROUTINE-ABSCESS: Culture: NO GROWTH

## 2010-08-29 LAB — APTT
aPTT: 26 seconds (ref 24–37)
aPTT: 29 seconds (ref 24–37)

## 2010-08-29 LAB — POCT I-STAT 4, (NA,K, GLUC, HGB,HCT)
Glucose, Bld: 126 mg/dL — ABNORMAL HIGH (ref 70–99)
HCT: 41 % (ref 39.0–52.0)

## 2010-08-29 LAB — HISTOPLASMA ANTIGEN, URINE

## 2010-10-03 ENCOUNTER — Other Ambulatory Visit (INDEPENDENT_AMBULATORY_CARE_PROVIDER_SITE_OTHER): Payer: Medicare Other

## 2010-10-03 ENCOUNTER — Encounter (INDEPENDENT_AMBULATORY_CARE_PROVIDER_SITE_OTHER): Payer: Medicare Other | Admitting: Vascular Surgery

## 2010-10-03 DIAGNOSIS — I6529 Occlusion and stenosis of unspecified carotid artery: Secondary | ICD-10-CM

## 2010-10-04 NOTE — Assessment & Plan Note (Signed)
OFFICE VISIT  Colin West, Colin West DOB:  12-Oct-1933                                       10/03/2010 CHART#:[id number]  CHIEF COMPLAINT:  Carotid stenosis.  HISTORY OF PRESENT ILLNESS:  The patient is a 77 year old male referred by Dr. Johnney Killian for evaluation of carotid stenosis.  The patient denies any symptoms of TIA, amaurosis or stroke.  His carotid stenosis was found on a screening ultrasound.  Chronic medical problems and risk factors include diabetes, hypertension and elevated cholesterol.  These are all currently stable followed by Dr. Johnney Killian.  PAST MEDICAL HISTORY:  Is as listed above.  PAST SURGICAL HISTORY:  Had a lung resection for Cryptococcus in the past.  He has had multiple inguinal hernia repairs, rotator cuff repair and an elbow operation.  FAMILY HISTORY:  He has no relatives with vascular disease at age less than 15.  SOCIAL HISTORY:  He is single, has 5 children.  He is a retired IT sales professional.  He is a nonsmoker, nonconsumer of alcohol.  REVIEW OF SYSTEMS:  Full 12 point review of systems was performed on the patient today.  Height is 5 feet 10 inches, weight is 174 pounds. PSYCHIATRIC:  He has mild anxiety and depression. All other systems were negative.  MEDICATIONS:  Lisinopril, lovastatin, aspirin, multivitamin, Fish Oil, vitamin D.  ALLERGIES:  He has no known drug allergies.  PHYSICAL EXAM:  Vital signs:  Blood pressure is 145/74 in the left arm, 150/74 in the right arm, heart rate is 74 and regular.  HEENT: Unremarkable.  Neck:  Has 2+ carotid pulses without bruit.  Chest: Clear to auscultation.  Cardiac:  Regular rate and rhythm without murmur.  Abdomen:  Soft, nontender, nondistended.  No masses. Extremities:  He has 2+ radial and femoral pulses bilaterally. Musculoskeletal:  Shows no major obvious joint deformities.  Neurologic: Shows symmetric upper extremity and lower extremity motor strength which is  5/5.  Skin:  Has no open ulcers or rashes.  He had a carotid duplex exam performed today which showed bilateral 40%- 60% stenosis.  In summary, the patient has bilateral moderate carotid stenosis which is currently asymptomatic.  I discussed with the patient today the pathophysiology of carotid disease and discussed with him that he would potentially need carotid endarterectomy if the stenosis ever progresses to greater than 80% or becomes symptomatic and I discussed the symptoms with him.  Otherwise he will have continued followup with medical management with risk factor modification, aspirin therapy and we will repeat his carotid duplex in 6 months' time.    Janetta Hora. Fields, MD Electronically Signed  CEF/MEDQ  D:  10/03/2010  T:  10/04/2010  Job:  [id number]  cc:   Dr Johnney Killian

## 2010-10-10 NOTE — Procedures (Unsigned)
CAROTID DUPLEX EXAM  INDICATION:  Carotid artery disease.  HISTORY: Diabetes:  No. Cardiac:  No. Hypertension:  Yes. Smoking:  No. Previous Surgery:  No. CV History:  Patient is currently asymptomatic. Amaurosis Fugax No, Paresthesias No, Hemiparesis No.                                      RIGHT             LEFT Brachial systolic pressure:         120               128 Brachial Doppler waveforms:         WNL               WNL Vertebral direction of flow:        Antegrade         Atypical antegrade DUPLEX VELOCITIES (cm/sec) CCA peak systolic                   110               109 ECA peak systolic                   107               107 ICA peak systolic                   160               157 ICA end diastolic                   33                38 PLAQUE MORPHOLOGY:                  Heterogenous      Heterogenous PLAQUE AMOUNT:                      Moderate          Moderate PLAQUE LOCATION:                    CCA, ICA, ECA     CCA, ICA, ECA  IMPRESSION: 1. Bilateral 40% to 59% stenosis within the internal carotid arteries. 2. Bilateral intimal thickening within the common carotid arteries. 3. Atypical left vertebral with decreased endoscopic velocities     suggestive of stenosis.  The right vertebral is prominent.  ___________________________________________ Janetta Hora. Fields, MD  OD/MEDQ  D:  10/03/2010  T:  10/03/2010  Job:  [id number]

## 2010-10-29 NOTE — Letter (Signed)
March 05, 2010   Colin West. Colin Howells, MD  [address]  [place], [place] [address]   Re:  Colin West, Colin West               DOB:  1933-06-22   Dear Colin West,   I saw the patient back today and he got a PET scan today but they do not  have any official reading on it but __________ that it is obviously  positive and since this was not there several years ago, this is  probably a new lesion either a cancer or an inflammatory lesion.  I  would favor this being a cancer.  I have recommended that he proceed  with resection of this.  His pulmonary function tests are satisfactory  with an FVC of 3.68, FEV-1 of 2.93, and diffusion capacity of 74%.  I  discussed this with he and his daughter and they agree to surgery.  We  will plan to do a VATS right upper lobectomy on Tuesday, the 27th at  [facility].   I appreciate the opportunity of seeing the patient.    Sincerely,   Ines Bloomer, M.D.  Electronically Signed   DPB/MEDQ  D:  03/05/2010  T:  03/06/2010  Job:  [id number]

## 2010-10-29 NOTE — Letter (Signed)
February 27, 2010   Excell Seltzer. Annabell Howells, MD  [address]  [place], [place] [address]   Re:  Colin West, Colin West               DOB:  14-Jul-1933   Dear Dr. Annabell Howells:   I appreciate the opportunity of seeing the patient.  This patient has  had a history of nephrolithiasis and a recent prostatitis.  He had a  hydrocele and underwent a left hydrocelectomy.  At that time, a chest x-  ray showed a new right upper lobe nodule.  A CT scan was done and the CT  scan revealed a 3-4 cm nodule in the right upper lobe.  Chest x-ray was  done in 2006 for an inguinal hernia repair that showed no lesion.  He  also has some small nodules in the lower lobe on CT scan.  He is  referred here for evaluation.  He quit smoking.  He has had no  hemoptysis, fever, chills, excessive sputum.  He quit smoking in 1965.   MEDICATIONS:  Aspirin 81 mg daily, hydrochlorothiazide, Levitra,  Prinivil.   PAST MEDICAL HISTORY:  He has hypertension.   PAST SURGICAL HISTORY:  Hernia repair with hydrocelectomy.  He has  hypertension.   FAMILY HISTORY:  Noncontributory.   SOCIAL HISTORY:  He is single and has 5 children.  He is retired as a  IT sales professional.  He does not drink alcohol on a regular basis.   REVIEW OF SYSTEMS:  GENERAL:  He is 182 pounds.  He is 5 feet 10 inches.  His weight has been stable.  CARDIAC:  No angina or atrial fibrillation.  PULMONARY:  No hemoptysis.  GI:  No nausea, vomiting, constipation, diarrhea.  GU:  See past medical history.  VASCULAR:  No claudication, DVT, TIAs.  NEUROLOGIC:  No dizziness, headaches, blackouts, or seizures.  MUSCULOSKELETAL:  Arthritis.  PSYCHIATRIC:  No depression or nervousness.  EYE/ENT:  No changes in eyesight or hearing.  HEMATOLOGIC:  No problems with bleeding, clotting disorders, or anemia.   PHYSICAL EXAMINATION:  Vital Signs:  His blood pressure is 154/84, pulse  74, respirations 18, sats are 97%.  Head, Eyes, Ears, Nose, and Throat:  Unremarkable.  Neck:   Supple without thyromegaly.  There is no  supraclavicular or axillary adenopathy.  Chest:  Clear to auscultation  and percussion.  Heart:  Regular sinus rhythm.  No murmurs.  Abdomen:  Soft.  There is no splenomegaly.  Extremities:  Pulses are 2+.  There is  no clubbing or edema.  Neurologic:  He is oriented x3.  Sensory and  motor intact.  Cranial nerves intact.   ASSESSMENT AND PLAN:  Since this lesion was not there 5 years ago, I  think we need to proceed with a full workup, plan to get a CAT scan and  pulmonary function test with PFTs.  We will hold off on a brain scan for  right now.  I will see him back after the PET scan and discuss further  options.   Sincerely,   Ines Bloomer, M.D.  Electronically Signed   DPB/MEDQ  D:  02/27/2010  T:  02/28/2010  Job:  [id number]

## 2010-10-29 NOTE — Assessment & Plan Note (Signed)
OFFICE VISIT   Colin West, Colin West  DOB:  1933-12-09                                        May 14, 2010  CHART #:  [id number]   HISTORY:  The patient is status post VATS resection of right upper lobe  nodule and wedge resection of right lower lobe nodule done on March 12, 2010, by Dr. Edwyna Shell.  He then underwent right thoracotomy for  drainage of hemothorax for postoperative hemorrhage on March 12, 2010.  The patient's cultures grew Cryptococcus and he is being treated  by Dr. Daiva Eves with fluconazole.  The patient was last seen in the  office on April 02, 2010.  He presents today for further followup  visit.  The patient is without complaints.  He denies any pain,  shortness of breath, coughing, hemoptysis, wheezing, fevers, night  sweats, or chills.  He has an appointment to see Dr. Daiva Eves in the  middle of December.  He states that he plans to stay on the fluconazole  per Dr. Daiva Eves for a total of 1 year.  The patient is back at work.  He  is without complaints.   PHYSICAL EXAMINATION:  Vital Signs:  Blood pressure 154/82, pulse of 84,  respirations of 18, and O2 sats 98% on room air.  Respiratory:  Clear to  auscultation bilaterally.  Cardiac:  Regular rate and rhythm.  Incisions  are clean, dry, and intact and healing well.   STUDIES:  The patient had a PA and lateral chest x-ray done today, which  shows persistent elevation of the right hemidiaphragm.  Chest x-ray is  stable.  No pneumothorax noted.  Postsurgical changes noted.   IMPRESSION AND PLAN:  The patient is progressing well following surgery.  He is to continue management per Dr. Daiva Eves.  We will bring the patient  back in 2 months with a  repeat PA and lateral chest x-ray for further management.  The patient  is on the interim, if he has any surgical issues, he is to contact us.  The patient is in agreement.   Sol Blazing, PA   KMD/MEDQ  D:  05/14/2010  T:   05/15/2010  Job:  [id number]   cc:   Excell Seltzer. Annabell Howells, M.D.  Acey Lav, MD

## 2010-10-29 NOTE — Letter (Signed)
April 02, 2010   Excell Seltzer. Annabell Howells, MD  [address]  [place], [place] [address]   Re:  RAHMAN, FERRALL               DOB:  May 23, 1934   Dear Jonny Ruiz,   I saw the patient in the office today after he has had resection of his  right upper lobe lesion that turned out to be cryptococcus.  His  cultures also grew out Cryptococcus and he is being treated by  Infectious Disease.  His blood pressure is 130/81, pulse of 80,  respirations 18, sats were 96%.  I have released him to full activity.  His chest x-ray showed normal postoperative changes.   Ines Bloomer, M.D.  Electronically Signed   DPB/MEDQ  D:  04/02/2010  T:  04/03/2010  Job:  [id number]

## 2010-11-01 NOTE — Op Note (Signed)
NAME:  ARVIL, UTZ                         ACCOUNT NO.:  [id number]   MEDICAL RECORD NO.:  [id number]                   PATIENT TYPE:  AMB   LOCATION:  DSC                                  FACILITY:  [facility]   PHYSICIAN:  Claude Manges. Cleophas Dunker, M.D.            DATE OF BIRTH:  February 01, 1934   DATE OF PROCEDURE:  01/04/2004  DATE OF DISCHARGE:                                 OPERATIVE REPORT   PREOPERATIVE DIAGNOSIS:  Right olecranon bursa with olecranon spurring.   POSTOPERATIVE DIAGNOSIS:  Right olecranon bursa with olecranon spurring.  Rupture of triceps tendon.   OPERATION PERFORMED:  1. Excision of olecranon bursa.  2. Repair of triceps rupture.   SURGEON:  Claude Manges. Cleophas Dunker, M.D.   ANESTHESIA:  IV Xylocaine.   COMPLICATIONS:  None.   INDICATIONS FOR PROCEDURE:  The patient is a 77 year old gentleman who had a  recent injury to his right elbow with establishment of an obvious olecranon  bursa.  Films revealed some spurring of the olecranon that apparently had  fractured.  He has been uncomfortable and wishes to have this excised.   DESCRIPTION OF PROCEDURE:  With the patient comfortable on the operating  table under intravenous sedation, IV Xylocaine was administered by  anesthesia with a proximal single tourniquet to the right upper extremity.  The right arm was then prepped with DuraPrep from the tips of the fingers to  the tourniquet and sterile draping was performed.   A slightly oblique incision was made between the lateral epicondyle and the  tip of the olecranon approximately two inches in length by sharp dissection.  Incision carried down to subcutaneous tissue.  I did enter the bursa.  There  was very minimal fluid but the bursal sac was excised.  The spurs were  identified and had fractured from the olecranon with proximal migration but  consistent with a significant rupture of the triceps.  There were still some  fibers in place but the majority had ruptured and  retracted approximately an  inch.  The edges were rounded.  Two Mitek anchors were then placed in the  tip of the olecranon and the tendon was reapproximated  to what appeared to  be its anatomic position.  The large bony fragment attached to the triceps  tendon were debrided so that they were not prominent.  I then sutured the  tendon along its medial and lateral edge with interrupted 0 Ethibond.  With  the elbow at 90 degrees of flexion, there was no motion and excellent  position.  The wound was irrigated with saline solution, the subcutaneous  was closed with running 2-0 Vicryl.  Skin closed with running 3-0  subcuticular Prolene with Steri-Strips over benzoin.  Sterile bulky dressing  was applied followed by posterior splint.   PLAN:  Percocet for pain.  Office one week.  Sling.  Claude Manges. Cleophas Dunker, M.D.   PWW/MEDQ  D:  01/04/2004  T:  01/05/2004  Job:  [id number]

## 2010-11-01 NOTE — Op Note (Signed)
NAMEIVANN, TRIMARCO               ACCOUNT NO.:  [id number]   MEDICAL RECORD NO.:  [id number]          PATIENT TYPE:  AMB   LOCATION:  DAY                          FACILITY:  [facility]   PHYSICIAN:  Wilmon Arms. Corliss Skains, M.D. DATE OF BIRTH:  28-Aug-1933   DATE OF PROCEDURE:  03/06/2005  DATE OF DISCHARGE:                                 OPERATIVE REPORT   PREOPERATIVE DIAGNOSES:  Left inguinal hernia.   POSTOPERATIVE DIAGNOSES:  Indirect left inguinal hernia.   SURGEON:  Wilmon Arms. Tsuei, M.D.   ANESTHESIA:  General endotracheal.   INDICATIONS FOR PROCEDURE:  The patient is a 77 year old male who has had a  previous umbilical hernia and a right inguinal hernia who presented with  some left groin pain over the last several months. He developed a large  bulge in this region but this bulge has been reducible. He was referred by  Dr. Lorenz Coaster for further evaluation. Surgical repair was recommended to the  patient.   DESCRIPTION OF PROCEDURE:  The patient was brought to the operating room and  placed in supine position on the operating room table. He had been given  preoperative antibiotics. After an adequate level of general endotracheal  anesthesia was obtained, the patient's left groin was shaved, prepped with  Betadine, draped in a sterile fashion. A time out was then taken to ensure  proper procedure and proper patient. The left groin was then infiltrated  with 0.25% Marcaine. A 4 cm incision was then made just above the inguinal  crease. Dissection was carried down to the subcutaneous tissues using Bovie  cautery. The external oblique fascia was identified and opened along the  direction of its fibers down to the external ring. There was some laxity to  the floor of the inguinal canal but no obvious hernia defect. There was a  large indirect hernia sac protruding down which was densely adherent to the  spermatic cord. The spermatic cord was circumferentially dissected and  isolated with  a Penrose drain. The cremasteric fibers were opened and two  cord lipomas were dissected free from the cord. One was amputated and sent  for pathologic examination. The other was reduced up into the internal ring.  The large indirect sac was dissected away from the spermatic cord. This was  densely adherent and appeared to extend down into the scrotum. At the  narrowest portion of this hernia sac, it was opened and no bowel was noted  in the contents of the hernia sac. This hernia sac was then ligated with 2-0  Vicryl. The entire hernia sac was then reduced up through the internal ring.  A RayTec sponge was then packed into the internal ring temporarily. A large  PHS mesh was brought onto the field. The RayTec sponge was removed and the  PHS mesh was inserted through the internal ring. Digital manipulation  assured that the underlay mesh was widely splayed out down over Cooper's  ligament laterally over the vessels and medially behind the rectus muscle.  The onlay mesh was grasped and retracted medially and laterally to firmly  seat the onlay  mesh. The lateral tails of the onlay mesh were tucked  underneath the edge of the external oblique fascia. A small slit was created  in the side of the onlay mesh for the spermatic cord. The tails were tied  behind the spermatic cord. Several interrupted sutures were used to secure  the mesh in place beginning at the pubic tubercle. The external oblique  fascia was then closed with 2-0 Vicryl in running fashion. 3-0 Vicryl was  used to close the Scarpa's fascia  and subcuticular tissues. 4-0 Monocryl was used to close the skin. All  sponge, instrument and needle counts were correct. Steri-Strips and a clean  dressing were applied. The patient was extubated and brought to the recovery  room in stable condition.      Wilmon Arms. Tsuei, M.D.  Electronically Signed     MKT/MEDQ  D:  03/06/2005  T:  03/07/2005  Job:  [id number]

## 2011-02-20 ENCOUNTER — Encounter: Payer: Self-pay | Admitting: Vascular Surgery

## 2011-03-05 ENCOUNTER — Ambulatory Visit (INDEPENDENT_AMBULATORY_CARE_PROVIDER_SITE_OTHER): Payer: Medicare Other | Admitting: Infectious Disease

## 2011-03-05 ENCOUNTER — Encounter: Payer: Self-pay | Admitting: Infectious Disease

## 2011-03-05 DIAGNOSIS — N433 Hydrocele, unspecified: Secondary | ICD-10-CM

## 2011-03-05 DIAGNOSIS — B459 Cryptococcosis, unspecified: Secondary | ICD-10-CM

## 2011-03-05 NOTE — Assessment & Plan Note (Signed)
S/p repair

## 2011-03-05 NOTE — Progress Notes (Signed)
Subjective:    Patient ID: Colin West, male    DOB: 05/13/1934, 77 y.o.   MRN: [id number]  HPI  77 year old man with PMHx sigfnican for r hypertension and hydrocele. He underwent surgery by Dr. Annabell Howells and on preoperatiev screening was found to have a ight apical lung nodule that was  new, compared to examination in 2009. This was followed by a PET scan which showed a hypermetabolic lesion in the right upper lobe with progressive cavitization concerning forcarcinoma. There were additional other smaller pulmonary nodules that had been seen on a CT scan as well. Pt underwent VATS by Dr. Edwyna Shell who encountered what appeared to be lung abscess in the RUL, he resected this area and 2nd nodule. On pathology no malignancy found but instead necrotizing granulomatous inflammtion with yeast seen on pathology thought to be highly consistent with histoplasmosis (they were going to preserve slides for future reference). Pt was started on itraconazole 200mg  three times a day x3 days and then intraconzole 200mg  two times a day. He was later found to actually be growing cryptococcus from his cultures and he was changed to fluconazole and has been on antifungal now for 11 months--he quit taking his meds in AUgust> he feels well other than an upper URI with nasal congestion, sinus congestion and cough that resolved after an antibacterial antibiotic. He is otherwise feelign well. Review of Systems  Constitutional: Negative for fever, chills, diaphoresis, activity change, appetite change, fatigue and unexpected weight change.  HENT: Negative for congestion, sore throat, rhinorrhea, sneezing, trouble swallowing and sinus pressure.   Eyes: Negative for photophobia and visual disturbance.  Respiratory: Negative for cough, chest tightness, shortness of breath, wheezing and stridor.   Cardiovascular: Negative for chest pain, palpitations and leg swelling.  Gastrointestinal: Negative for nausea, vomiting, abdominal pain,  diarrhea, constipation, blood in stool, abdominal distention and anal bleeding.  Genitourinary: Negative for dysuria, hematuria, flank pain and difficulty urinating.  Musculoskeletal: Negative for myalgias, back pain, joint swelling, arthralgias and gait problem.  Skin: Negative for color change, pallor, rash and wound.  Neurological: Negative for dizziness, tremors, weakness and light-headedness.  Hematological: Negative for adenopathy. Does not bruise/bleed easily.  Psychiatric/Behavioral: Negative for behavioral problems, confusion, sleep disturbance, dysphoric mood, decreased concentration and agitation.       Objective:   Physical Exam  Constitutional: He is oriented to person, place, and time. He appears well-developed and well-nourished. No distress.  HENT:  Head: Normocephalic and atraumatic.  Mouth/Throat: No oropharyngeal exudate.  Eyes: Conjunctivae and EOM are normal. Pupils are equal, round, and reactive to light. No scleral icterus.  Neck: Normal range of motion. Neck supple. No JVD present.  Cardiovascular: Normal rate, regular rhythm and normal heart sounds.  Exam reveals no gallop and no friction rub.   No murmur heard. Pulmonary/Chest: Effort normal and breath sounds normal. No respiratory distress. He has no wheezes. He has no rales. He exhibits no tenderness.  Abdominal: He exhibits no distension and no mass. There is no tenderness. There is no rebound and no guarding.  Musculoskeletal: He exhibits no edema and no tenderness.  Lymphadenopathy:    He has no cervical adenopathy.  Neurological: He is alert and oriented to person, place, and time. He has normal reflexes. He exhibits normal muscle tone. Coordination normal.  Skin: Skin is warm and dry. He is not diaphoretic. No erythema. No pallor.  Psychiatric: He has a normal mood and affect. His behavior is normal. Judgment and thought content normal.  Assessment & Plan:  CRYPTOCOCCOSIS He has received 11  months. I had planned on a year but clinically he is doing very well. Will have him rtc PRN  HYDROCELE Sp repair

## 2011-03-05 NOTE — Assessment & Plan Note (Signed)
He has received 11 months. I had planned on a year but clinically he is doing very well. Will have him rtc PRN

## 2011-03-13 LAB — CBC
HCT: 34.5 — ABNORMAL LOW
Hemoglobin: 11.3 — ABNORMAL LOW
Platelets: 221
RBC: 4.4
WBC: 6.8

## 2011-03-13 LAB — DIFFERENTIAL
Basophils Absolute: 0
Basophils Relative: 0
Monocytes Absolute: 0.6
Neutro Abs: 4.4

## 2011-03-13 LAB — COMPREHENSIVE METABOLIC PANEL
Albumin: 3.6
Alkaline Phosphatase: 48
BUN: 23
CO2: 22
Chloride: 111
Glucose, Bld: 113 — ABNORMAL HIGH
Potassium: 3.8
Total Bilirubin: 1

## 2011-03-13 LAB — POCT CARDIAC MARKERS: Myoglobin, poc: 150

## 2011-04-02 ENCOUNTER — Encounter: Payer: Self-pay | Admitting: Vascular Surgery

## 2011-04-03 ENCOUNTER — Ambulatory Visit (INDEPENDENT_AMBULATORY_CARE_PROVIDER_SITE_OTHER): Payer: Medicare Other | Admitting: Vascular Surgery

## 2011-04-03 ENCOUNTER — Other Ambulatory Visit (INDEPENDENT_AMBULATORY_CARE_PROVIDER_SITE_OTHER): Payer: Medicare Other | Admitting: Vascular Surgery

## 2011-04-03 ENCOUNTER — Encounter: Payer: Self-pay | Admitting: Vascular Surgery

## 2011-04-03 VITALS — BP 147/77 | HR 76 | Ht 69.0 in | Wt 180.0 lb

## 2011-04-03 DIAGNOSIS — I6529 Occlusion and stenosis of unspecified carotid artery: Secondary | ICD-10-CM

## 2011-04-03 NOTE — Progress Notes (Signed)
[facility] HISTORY AND PHYSICAL   History of Present Illness:  Patient is a 77 y.o. year old male who presents for follow-up evaluation for carotid stenosis.  He is on Aspirin for antiplatelet therapy.  His atherosclerotic risk factors remain diabetes, elevated cholesterol, hypertension.  These are all currently stable and followed by his primary care physician.  He denies any new neurologic events including amaurosis, numbness, or weakness.  Past Medical History  Diagnosis Date  . Diabetes mellitus   . Hypertension   . Hyperlipidemia   . Depression   . Anxiety   . Carotid artery occlusion     Past Surgical History  Procedure Date  . Hernia repair   . Rotator cuff repair   . Lung removal, partial     Review of Systems:  Neurologic: as above Cardiac:denies shortness of breath or chest pain Pulmonary: denies cough or wheeze GU: complains of inability to get and erection  Social History History  Substance Use Topics  . Smoking status: Never Smoker   . Smokeless tobacco: Not on file  . Alcohol Use: No    Allergies  No Known Allergies   Current Outpatient Prescriptions  Medication Sig Dispense Refill  . aspirin 81 MG tablet Take 81 mg by mouth daily.        . Cholecalciferol (VITAMIN D PO) Take by mouth.        . hydrochlorothiazide 25 MG tablet Take 25 mg by mouth. Take one half tab once a day       . lisinopril (PRINIVIL,ZESTRIL) 20 MG tablet Take 20 mg by mouth. Take 2 tabs daily        . LOVASTATIN PO Take by mouth daily.        . Multiple Vitamin (MULTIVITAMIN PO) Take by mouth.          Physical Examination  Filed Vitals:   04/03/11 1527 04/03/11 1530  BP: 135/72 147/77  Pulse: 75 76  Height: 5\' 9"  (1.753 m)   Weight: 180 lb (81.647 kg)   SpO2: 99%     Body mass index is 26.58 kg/(m^2).  General:  Alert and oriented, no acute distress HEENT: Normal Neck: No bruit or JVD Pulmonary: Clear to auscultation  bilaterally Cardiac: Regular Rate and Rhythm without murmur Neurologic: Upper and lower extremity motor 5/5 and symmetric  DATA: Carotid duplex today was reviewed and interpreted which shows bilateral 40-60% carotid stenosis with antegrade vertebral flow essentially unchanged from April of 2012   ASSESSMENT: Stable moderate carotid stenosis bilateral, sexual dysfunction   PLAN: #1 he will have a carotid duplex scan repeated in one years time   #2 he will discuss with his urologist Dr. Annabell Howells treatment possibilities for his sexual dysfunction, he has followup scheduled in the near future  Fabienne Bruns, MD [facility] Office: [phone] Pager: [phone]

## 2011-04-10 NOTE — Procedures (Unsigned)
CAROTID DUPLEX EXAM  INDICATION:  Follow up carotid stenosis.  HISTORY: Diabetes:  Yes. Cardiac:  No. Hypertension:  Yes. Smoking:  No. Previous Surgery:  No carotid intervention. CV History:  Asymptomatic. Amaurosis Fugax No, Paresthesias No, Hemiparesis No.                                      RIGHT             LEFT Brachial systolic pressure:         142               132 Brachial Doppler waveforms:         WNL               WNL Vertebral direction of flow:        Antegrade         Antegrade DUPLEX VELOCITIES (cm/sec) CCA peak systolic                   113               113 ECA peak systolic                   124               154 ICA peak systolic                   174               170 ICA end diastolic                   42                47 PLAQUE MORPHOLOGY:                  Calcified         Calcified PLAQUE AMOUNT:                      Moderate          Moderate PLAQUE LOCATION:                    CCA/ICA           CCA/ICA/ECA  IMPRESSION: 1. Bilateral common carotid artery disease present. 2. Bilateral internal carotid artery stenosis in the 40% to 59% range. 3. Left external carotid artery stenosis present. 4. Bilateral vertebral arteries are patent and antegrade. 5. Essentially unchanged since the previous study on 10/03/2010.  ___________________________________________ Janetta Hora. Fields, MD  SH/MEDQ  D:  04/03/2011  T:  04/03/2011  Job:  [id number]

## 2011-04-14 ENCOUNTER — Other Ambulatory Visit: Payer: Self-pay

## 2011-04-14 DIAGNOSIS — I6529 Occlusion and stenosis of unspecified carotid artery: Secondary | ICD-10-CM

## 2012-03-26 ENCOUNTER — Other Ambulatory Visit: Payer: Self-pay | Admitting: *Deleted

## 2012-03-26 DIAGNOSIS — I6529 Occlusion and stenosis of unspecified carotid artery: Secondary | ICD-10-CM

## 2012-03-31 ENCOUNTER — Encounter: Payer: Self-pay | Admitting: Neurosurgery

## 2012-04-01 ENCOUNTER — Encounter: Payer: Self-pay | Admitting: Neurosurgery

## 2012-04-01 ENCOUNTER — Other Ambulatory Visit (INDEPENDENT_AMBULATORY_CARE_PROVIDER_SITE_OTHER): Payer: Medicare Other | Admitting: *Deleted

## 2012-04-01 ENCOUNTER — Ambulatory Visit (INDEPENDENT_AMBULATORY_CARE_PROVIDER_SITE_OTHER): Payer: Medicare Other | Admitting: Neurosurgery

## 2012-04-01 VITALS — BP 132/74 | HR 71 | Resp 16 | Ht 70.0 in | Wt 185.0 lb

## 2012-04-01 DIAGNOSIS — I6529 Occlusion and stenosis of unspecified carotid artery: Secondary | ICD-10-CM

## 2012-04-01 NOTE — Progress Notes (Signed)
[facility] [place] Carotid Office Note  CC: Carotid surveillance Referring Physician: Fields  History of Present Illness: 78 year old male patient of Dr. Darrick Penna with no history of carotid intervention. The patient denies any signs or symptoms of CVA, TIA, amaurosis fugax or neural deficit. The patient denies any new medical diagnoses or recent surgery.  Past Medical History  Diagnosis Date  . Diabetes mellitus   . Hypertension   . Hyperlipidemia   . Depression   . Anxiety   . Carotid artery occlusion     ROS: [x]  Positive   [ ]  Denies    General: [ ]  Weight loss, [ ]  Fever, [ ]  chills Neurologic: [ ]  Dizziness, [ ]  Blackouts, [ ]  Seizure [ ]  Stroke, [ ]  "Mini stroke", [ ]  Slurred speech, [ ]  Temporary blindness; [ ]  weakness in arms or legs, [ ]  Hoarseness Cardiac: [ ]  Chest pain/pressure, [ ]  Shortness of breath at rest [ ]  Shortness of breath with exertion, [ ]  Atrial fibrillation or irregular heartbeat Vascular: [ ]  Pain in legs with walking, [ ]  Pain in legs at rest, [ ]  Pain in legs at night,  [ ]  Non-healing ulcer, [ ]  Blood clot in vein/DVT,   Pulmonary: [ ]  Home oxygen, [ ]  Productive cough, [ ]  Coughing up blood, [ ]  Asthma,  [ ]  Wheezing Musculoskeletal:  [ ]  Arthritis, [ ]  Low back pain, [ ]  Joint pain Hematologic: [ ]  Easy Bruising, [ ]  Anemia; [ ]  Hepatitis Gastrointestinal: [ ]  Blood in stool, [ ]  Gastroesophageal Reflux/heartburn, [ ]  Trouble swallowing Urinary: [ ]  chronic Kidney disease, [ ]  on HD - [ ]  MWF or [ ]  TTHS, [ ]  Burning with urination, [ ]  Difficulty urinating Skin: [ ]  Rashes, [ ]  Wounds Psychological: [ ]  Anxiety, [ ]  Depression   Social History History  Substance Use Topics  . Smoking status: Never Smoker   . Smokeless tobacco: Not on file  . Alcohol Use: No    Family History No family history on file.  No Known Allergies  Current Outpatient Prescriptions  Medication Sig Dispense Refill  . aspirin 81 MG tablet  Take 81 mg by mouth daily.        . Cholecalciferol (VITAMIN D PO) Take by mouth.        . hydrochlorothiazide 25 MG tablet Take 25 mg by mouth. Take one half tab once a day       . lisinopril (PRINIVIL,ZESTRIL) 20 MG tablet Take 20 mg by mouth. Take 2 tabs daily        . LOVASTATIN PO Take by mouth daily.        . Multiple Vitamin (MULTIVITAMIN PO) Take by mouth.          Physical Examination  There were no vitals filed for this visit.  There is no height or weight on file to calculate BMI.  General:  WDWN in NAD Gait: Normal HEENT: WNL Eyes: Pupils equal Pulmonary: normal non-labored breathing , without Rales, rhonchi,  wheezing Cardiac: RRR, without  Murmurs, rubs or gallops; Abdomen: soft, NT, no masses Skin: no rashes, ulcers noted  Vascular Exam Pulses: 3+ radial pulses bilaterally Carotid bruits: Carotid pulses to auscultation no bruits are heard Extremities without ischemic changes, no Gangrene , no cellulitis; no open wounds;  Musculoskeletal: no muscle wasting or atrophy   Neurologic: A&O X 3; Appropriate Affect ; SENSATION: normal; MOTOR FUNCTION:  moving all extremities equally. Speech is fluent/normal  Non-Invasive  Vascular Imaging CAROTID DUPLEX 04/01/2012  Right ICA 40 - 59 % stenosis Left ICA 40 - 59 % stenosis   ASSESSMENT/PLAN: asymptomatic patient with mild/moderate carotid stenosis bilaterally. The patient will followup in one year with repeat carotid duplex. The patient's questions were encouraged and answered, he is in agreement with this plan.  Lauree Chandler ANP   Clinic MD: Darrick Penna

## 2012-04-02 NOTE — Addendum Note (Signed)
Addended by: Sharee Pimple on: 04/02/2012 07:48 AM   Modules accepted: Orders

## 2013-03-31 ENCOUNTER — Encounter: Payer: Self-pay | Admitting: Family

## 2013-04-01 ENCOUNTER — Encounter: Payer: Self-pay | Admitting: Family

## 2013-04-01 ENCOUNTER — Ambulatory Visit (INDEPENDENT_AMBULATORY_CARE_PROVIDER_SITE_OTHER): Payer: Medicare Other | Admitting: Family

## 2013-04-01 ENCOUNTER — Ambulatory Visit (HOSPITAL_COMMUNITY)
Admission: RE | Admit: 2013-04-01 | Discharge: 2013-04-01 | Disposition: A | Discharge status: Home / Self Care | Payer: Medicare Other | Source: Ambulatory Visit | Attending: Family | Admitting: Family

## 2013-04-01 DIAGNOSIS — Z48812 Encounter for surgical aftercare following surgery on the circulatory system: Secondary | ICD-10-CM | POA: Insufficient documentation

## 2013-04-01 DIAGNOSIS — I6529 Occlusion and stenosis of unspecified carotid artery: Secondary | ICD-10-CM | POA: Insufficient documentation

## 2013-04-01 NOTE — Progress Notes (Signed)
Established Carotid Patient  History of Present Illness  Colin West is a 79 y.o. male patient of Dr. Darrick Penna with no history of carotid intervention, presents today for scheduled carotid surveillance.  Patient denies ever having a stroke, denies any cardiac problems.  The patient denies amaurosis fugax or monocular blindness.  The patient  denies facial drooping.  Pt. denies hemiplegia.  The patient denies receptive or expressive aphasia.  Pt. denies extremity weakness.  Patient  denies New Medical or Surgical History.  Pt Diabetic: No Pt smoker: non-smoker  Pt meds include: Statin : Yes ASA: Yes Other anticoagulants/antiplatelets: no   Past Medical History  Diagnosis Date  . Diabetes mellitus   . Hypertension   . Hyperlipidemia   . Depression   . Anxiety   . Carotid artery occlusion     Social History History  Substance Use Topics  . Smoking status: Never Smoker   . Smokeless tobacco: Not on file  . Alcohol Use: No    Family History Family History  Problem Relation Age of Onset  . Heart disease Brother     Heart Disease before age 64  . Heart attack Brother     Surgical History Past Surgical History  Procedure Laterality Date  . Hernia repair    . Rotator cuff repair      Bilateral repair  . Lung removal, partial      No Known Allergies  Current Outpatient Prescriptions  Medication Sig Dispense Refill  . aspirin 81 MG tablet Take 81 mg by mouth daily.        . Cholecalciferol (VITAMIN D PO) Take by mouth.        . hydrochlorothiazide 25 MG tablet Take 25 mg by mouth. Take one half tab once a day       . lisinopril (PRINIVIL,ZESTRIL) 20 MG tablet Take 20 mg by mouth. Take 2 tabs daily        . LOVASTATIN PO Take by mouth daily.        . Multiple Vitamin (MULTIVITAMIN PO) Take by mouth.         No current facility-administered medications for this visit.    Review of Systems : [x]  Positive   [ ]  Denies  General:[ ]  Weight loss,  [ ]  Weight  gain, [ ]  Loss of appetite, [ ]  Fever, [ ]  chills  Neurologic: [ ]  Dizziness, [ ]  Blackouts, [ ]  Headaches, [ ]  Seizure [ ]  Stroke, [ ]  "Mini stroke", [ ]  Slurred speech, [ ]  Temporary blindness;  [ ] weakness,  Ear/Nose/Throat: [ ]  Change in hearing, [ ]  Nose bleeds, [ ]  Hoarseness  Vascular:[ ]  Pain in legs with walking, [ ]  Pain in feet while lying flat , [ ]   Non-healing ulcer, [ ]  Blood clot in vein,    Pulmonary: [ ]  Home oxygen, [ ]   Productive cough, [ ]  Bronchitis, [ ]  Coughing up blood,  [ ]  Asthma, [ ]  Wheezing  Musculoskeletal:  [ ]  Arthritis, [ ]  Joint pain, [ ]  low back pain  Cardiac: [ ]  Chest pain, [ ]  Shortness of breath when lying flat, [ ]  Shortness of breath with exertion, [ ]  Palpitations, [ ]  Heart murmur, [ ]   Atrial fibrillation  Hematologic:[ ]  Easy Bruising, [ ]  Anemia; [ ]  Hepatitis  Psychiatric: [ ]   Depression, [ ]  Anxiety   Gastrointestinal: [ ]  Black stool, [ ]  Blood in stool, [ ]  Peptic ulcer disease,  [ ]  Gastroesophageal Reflux, [ ]   Trouble swallowing, [ ]  Diarrhea, [ ]  Constipation  Urinary: [ ]  chronic Kidney disease, [ ]  on HD, [ ]  Burning with urination, [ ]  Frequent urination, [ ]  Difficulty urinating;   Skin: [ ]  Rashes, [ ]  Wounds    Physical Examination  Filed Vitals:   04/01/13 1441  BP: 132/77  Pulse:   Resp:    Filed Weights   04/01/13 1436  Weight: 174 lb (78.926 kg)   Body mass index is 25.68 kg/(m^2).  General: WDWN male in NAD GAIT: normal Eyes: PERRLA Pulmonary:  CTAB, Negative  Rales, Negative rhonchi, & Negative wheezing.  Cardiac: regular Rhythm ,  Negative Murmurs.  VASCULAR EXAM Carotid Bruits Left Right   Negative Negative    Aorta is not palpable. Radial pulses are 2+ palpable and equal.                                                                                                                            LE Pulses LEFT RIGHT       FEMORAL   palpable   palpable        POPLITEAL  not  palpable   not   palpable       POSTERIOR TIBIAL  not  palpable   not  palpable        DORSALIS PEDIS      ANTERIOR TIBIAL  palpable   palpable     Gastrointestinal: soft, nontender, BS WNL, no r/g,  negative masses.  Musculoskeletal: Negative muscle atrophy/wasting. M/S 5/5 throughout, Extremities without ischemic changes.  Neurologic: A&O X 3; Appropriate Affect ; SENSATION ;normal;  Speech is normal CN 2-12 intact, Pain and light touch intact in extremities, Motor exam as listed above.   Non-Invasive Vascular Imaging CAROTID DUPLEX 04/01/2013   Right ICA: <40% stenosis. Left ICA: <40% stenosis.  Previous carotid studies demonstrated: RICA 40 - 59 % stenosis, LICA 40 - 59 % stenosis.  These findings are Improved from previous exam.  Assessment: Colin West is a 79 y.o. male who presents with asymptomatic bilateral ICA stenosis less than 40%. The  ICA stenosis is  Improved from previous exam a year ago. He has lost weight and improved his diet. Brachial pressures are equal.  Plan: Follow-up in 2 years with Carotid Duplex scan.   I discussed in depth with the patient the nature of atherosclerosis, and emphasized the importance of maximal medical management including strict control of blood pressure, blood glucose, and lipid levels, obtaining regular exercise, and continued cessation of smoking.  The patient is aware that without maximal medical management the underlying atherosclerotic disease process will progress, limiting the benefit of any interventions. The patient was given information about stroke prevention and what symptoms should prompt the patient to seek immediate medical care. Thank you for allowing [place] to participate in this patient's care.  Charisse March, RN, MSN, FNP-C [facility] Office: [phone]  Clinic Physician: Imogene Burn  04/01/2013 2:23 PM

## 2013-04-01 NOTE — Patient Instructions (Signed)
Stroke Prevention Some medical conditions and behaviors are associated with an increased chance of having a stroke. You may prevent a stroke by making healthy choices and managing medical conditions. Reduce your risk of having a stroke by:  Staying physically active. Get at least 30 minutes of activity on most or all days.  Not smoking. It may also be helpful to avoid exposure to secondhand smoke.  Limiting alcohol use. Moderate alcohol use is considered to be:  No more than 2 drinks per day for men.  No more than 1 drink per day for nonpregnant women.  Eating healthy foods.  Include 5 or more servings of fruits and vegetables a day.  Certain diets may be prescribed to address high blood pressure, high cholesterol, diabetes, or obesity.  Managing your cholesterol levels.  A low-saturated fat, low-trans fat, low-cholesterol, and high-fiber diet may control cholesterol levels.  Take any prescribed medicines to control cholesterol as directed by your caregiver.  Managing your diabetes.  A controlled-carbohydrate, controlled-sugar diet is recommended to manage diabetes.  Take any prescribed medicines to control diabetes as directed by your caregiver.  Controlling your high blood pressure (hypertension).  A low-salt (sodium), low-saturated fat, low-trans fat, and low-cholesterol diet is recommended to manage high blood pressure.  Take any prescribed medicines to control hypertension as directed by your caregiver.  Maintaining a healthy weight.  A reduced-calorie, low-sodium, low-saturated fat, low-trans fat, low-cholesterol diet is recommended to manage weight.  Stopping drug abuse.  Avoiding birth control pills.  Talk to your caregiver about the risks of taking birth control pills if you are over 35 years old, smoke, get migraines, or have ever had a blood clot.  Getting evaluated for sleep disorders (sleep apnea).  Talk to your caregiver about getting a sleep evaluation  if you snore a lot or have excessive sleepiness.  Taking medicines as directed by your caregiver.  For some people, aspirin or blood thinners (anticoagulants) are helpful in reducing the risk of forming abnormal blood clots that can lead to stroke. If you have the irregular heart rhythm of atrial fibrillation, you should be on a blood thinner unless there is a good reason you cannot take them.  Understand all your medicine instructions. SEEK IMMEDIATE MEDICAL CARE IF:   You have sudden weakness or numbness of the face, arm, or leg, especially on one side of the body.  You have sudden confusion.  You have trouble speaking (aphasia) or understanding.  You have sudden trouble seeing in one or both eyes.  You have sudden trouble walking.  You have dizziness.  You have a loss of balance or coordination.  You have a sudden, severe headache with no known cause.  You have new chest pain or an irregular heartbeat. Any of these symptoms may represent a serious problem that is an emergency. Do not wait to see if the symptoms will go away. Get medical help right away. Call your local emergency services (911 in [place] Do not drive yourself to the hospital. Document Released: 07/10/2004 Document Revised: 08/25/2011 Document Reviewed: 01/20/2011 [employer] Patient Information 2014 [employer].  

## 2013-05-30 ENCOUNTER — Ambulatory Visit (INDEPENDENT_AMBULATORY_CARE_PROVIDER_SITE_OTHER): Payer: Medicare Other | Admitting: Surgery

## 2013-05-30 ENCOUNTER — Encounter (INDEPENDENT_AMBULATORY_CARE_PROVIDER_SITE_OTHER): Payer: Self-pay | Admitting: Surgery

## 2013-05-30 ENCOUNTER — Encounter (HOSPITAL_COMMUNITY): Payer: Self-pay | Admitting: Pharmacy Technician

## 2013-05-30 VITALS — BP 142/78 | HR 74 | Temp 97.6°F | Resp 16 | Ht 69.0 in | Wt 169.8 lb

## 2013-05-30 DIAGNOSIS — K4020 Bilateral inguinal hernia, without obstruction or gangrene, not specified as recurrent: Secondary | ICD-10-CM | POA: Insufficient documentation

## 2013-05-30 DIAGNOSIS — K4090 Unilateral inguinal hernia, without obstruction or gangrene, not specified as recurrent: Secondary | ICD-10-CM | POA: Insufficient documentation

## 2013-05-30 DIAGNOSIS — K4091 Unilateral inguinal hernia, without obstruction or gangrene, recurrent: Secondary | ICD-10-CM

## 2013-05-30 NOTE — Progress Notes (Signed)
Subjective:     Patient ID: Colin West, male   DOB: 05/07/1934, 79 y.o.   MRN: [id number]  HPI  Colin West  06/05/1934 [id number]  Patient Care Team: Alexei Radiontchenko as PCP - General (Family Medicine) John Wrenn, MD as Consulting Physician (Urology)  This patient is a 79 y.o.male who presents today for surgical evaluation at the request of Dr. Wrenn.   Reason for visit: Left and probable right inguinal hernias  Pleasant elderly but active male.  History of left inguinal hernia repaired in an open fashion with mesh 2006.  Has noted this past year some left groin bulging.  Has gotten larger.  Especially on the left side.  He wonders if he has something on the right side as well.  He saw his urologist in the summer.  Concern for at least a left inguinal hernia.  Surgical consultation recommended.   No exertional chest/neck/shoulder/arm pain.  Patient can walk 180 minutes for about 5 miles without difficulty.  He has had history of prostatitis with prior therapies.  He voids quite well.  Followed by Dr. Wrenn with urology.  No history of urinary retention.  Has a bowel movement every day.  No history of skin infections.   Patient Active Problem List   Diagnosis Date Noted  . Inguinal hernia, right 05/30/2013  . Hernia, inguinal, left - recurrent 05/30/2013  . Occlusion and stenosis of carotid artery without mention of cerebral infarction 04/01/2012  . Hypertrophy of prostate with urinary obstruction and other lower urinary tract symptoms (LUTS) 05/30/2010  . CRYPTOCOCCOSIS 03/27/2010  . ESSENTIAL HYPERTENSION 03/27/2010  . HYDROCELE 03/27/2010    Past Medical History  Diagnosis Date  . Diabetes mellitus   . Hypertension   . Hyperlipidemia   . Depression   . Anxiety   . Carotid artery occlusion     Past Surgical History  Procedure Laterality Date  . Inguinal hernia repair Left Sept 2006    Dr Tsuei  . Rotator cuff repair      Bilateral repair  . Lung removal,  partial  Sept. 2011     Dr. Burney  . Elbow arthroscopy      History   Social History  . Marital Status: Divorced    Spouse Name: N/A    Number of Children: N/A  . Years of Education: N/A   Occupational History  . Not on file.   Social History Main Topics  . Smoking status: Former Smoker  . Smokeless tobacco: Never Used  . Alcohol Use: No  . Drug Use: No  . Sexual Activity: Not on file   Other Topics Concern  . Not on file   Social History Narrative  . No narrative on file    Family History  Problem Relation Age of Onset  . Heart disease Brother     Heart Disease before age 60  . Heart attack Brother     Current Outpatient Prescriptions  Medication Sig Dispense Refill  . aspirin 81 MG tablet Take 81 mg by mouth daily.        . Cholecalciferol (VITAMIN D PO) Take by mouth.        . lisinopril (PRINIVIL,ZESTRIL) 20 MG tablet Take 20 mg by mouth.       . LOVASTATIN PO Take by mouth daily.        . METFORMIN HCL PO Take by mouth.      . Multiple Vitamin (MULTIVITAMIN PO) Take by mouth. Centrum Silver         No current facility-administered medications for this visit.     No Known Allergies  BP 142/78  Pulse 74  Temp(Src) 97.6 F (36.4 C) (Temporal)  Resp 16  Ht 5' 9" (1.753 m)  Wt 169 lb 12.8 oz (77.021 kg)  BMI 25.06 kg/m2  No results found.   Review of Systems  Constitutional: Negative for fever, chills and diaphoresis.  HENT: Negative for ear discharge, facial swelling, mouth sores, nosebleeds, sore throat and trouble swallowing.   Eyes: Negative for photophobia, discharge and visual disturbance.  Respiratory: Negative for choking, chest tightness, shortness of breath and stridor.   Cardiovascular: Negative for chest pain and palpitations.  Gastrointestinal: Negative for nausea, vomiting, abdominal pain, diarrhea, constipation, blood in stool, abdominal distention, anal bleeding and rectal pain.  Endocrine: Negative for cold intolerance and heat  intolerance.  Genitourinary: Positive for flank pain. Negative for dysuria, urgency, frequency, hematuria, discharge, penile swelling, scrotal swelling, enuresis, difficulty urinating, penile pain and testicular pain.  Musculoskeletal: Negative for arthralgias, gait problem, joint swelling, myalgias and neck pain.  Skin: Negative for color change, pallor, rash and wound.  Allergic/Immunologic: Negative for environmental allergies and food allergies.  Neurological: Negative for dizziness, speech difficulty, weakness, numbness and headaches.  Hematological: Negative for adenopathy. Does not bruise/bleed easily.  Psychiatric/Behavioral: Negative for hallucinations, confusion and agitation.       Objective:   Physical Exam  Constitutional: He is oriented to person, place, and time. He appears well-developed and well-nourished. No distress.  HENT:  Head: Normocephalic.  Mouth/Throat: Oropharynx is clear and moist. No oropharyngeal exudate.  Eyes: Conjunctivae and EOM are normal. Pupils are equal, round, and reactive to light. No scleral icterus.  Neck: Normal range of motion. Neck supple. No tracheal deviation present.  Cardiovascular: Normal rate, regular rhythm and intact distal pulses.   Pulmonary/Chest: Effort normal and breath sounds normal. No respiratory distress.  Abdominal: Soft. He exhibits no distension. There is no tenderness. A hernia is present. Hernia confirmed positive in the right inguinal area and confirmed positive in the left inguinal area. Hernia confirmed negative in the ventral area.    Musculoskeletal: Normal range of motion. He exhibits no tenderness.  Lymphadenopathy:    He has no cervical adenopathy.       Right: No inguinal adenopathy present.       Left: No inguinal adenopathy present.  Neurological: He is alert and oriented to person, place, and time. No cranial nerve deficit. He exhibits normal muscle tone. Coordination normal.  Skin: Skin is warm and dry. No  rash noted. He is not diaphoretic. No erythema. No pallor.  Psychiatric: He has a normal mood and affect. His behavior is normal. Judgment and thought content normal.       Assessment:     Recurrent left induced right inguinal hernias.     Plan:     I think he would benefit from surgery.  Given the recurrence and bilateral nature, recommended a laparoscopic approach.  He is very interested in this:  The anatomy & physiology of the abdominal wall and pelvic floor was discussed.  The pathophysiology of hernias in the inguinal and pelvic region was discussed.  Natural history risks such as progressive enlargement, pain, incarceration & strangulation was discussed.   Contributors to complications such as smoking, obesity, diabetes, prior surgery, etc were discussed.    I feel the risks of no intervention will lead to serious problems that outweigh the operative risks; therefore, I recommended surgery to reduce and repair the   hernia.  I explained laparoscopic techniques with possible need for an open approach.  I noted usual use of mesh to patch and/or buttress hernia repair  Risks such as bleeding, infection, abscess, need for further treatment, heart attack, death, and other risks were discussed.  I noted a good likelihood this will help address the problem.   Goals of post-operative recovery were discussed as well.  Possibility that this will not correct all symptoms was explained.  I stressed the importance of low-impact activity, aggressive pain control, avoiding constipation, & not pushing through pain to minimize risk of post-operative chronic pain or injury. Possibility of reherniation was discussed.  We will work to minimize complications.     An educational handout further explaining the pathology & treatment options was given as well.  Questions were answered.  The patient expresses understanding & wishes to proceed with surgery.         

## 2013-05-30 NOTE — Patient Instructions (Signed)
See the Handout(s) we gave you.  Consider surgery.  Please call our office at [phone] if you wish to schedule surgery or if you have further questions / concerns.   Hernia A hernia occurs when an internal organ pushes out through a weak spot in the abdominal wall. Hernias most commonly occur in the groin and around the navel. Hernias often can be pushed back into place (reduced). Most hernias tend to get worse over time. Some abdominal hernias can get stuck in the opening (irreducible or incarcerated hernia) and cannot be reduced. An irreducible abdominal hernia which is tightly squeezed into the opening is at risk for impaired blood supply (strangulated hernia). A strangulated hernia is a medical emergency. Because of the risk for an irreducible or strangulated hernia, surgery may be recommended to repair a hernia. CAUSES   Heavy lifting.  Prolonged coughing.  Straining to have a bowel movement.  A cut (incision) made during an abdominal surgery. HOME CARE INSTRUCTIONS   Bed rest is not required. You may continue your normal activities.  Avoid lifting more than 10 pounds (4.5 kg) or straining.  Cough gently. If you are a smoker it is best to stop. Even the best hernia repair can break down with the continual strain of coughing. Even if you do not have your hernia repaired, a cough will continue to aggravate the problem.  Do not wear anything tight over your hernia. Do not try to keep it in with an outside bandage or truss. These can damage abdominal contents if they are trapped within the hernia sac.  Eat a normal diet.  Avoid constipation. Straining over long periods of time will increase hernia size and encourage breakdown of repairs. If you cannot do this with diet alone, stool softeners may be used. SEEK IMMEDIATE MEDICAL CARE IF:   You have a fever.  You develop increasing abdominal pain.  You feel nauseous or vomit.  Your hernia is stuck outside the abdomen, looks  discolored, feels hard, or is tender.  You have any changes in your bowel habits or in the hernia that are unusual for you.  You have increased pain or swelling around the hernia.  You cannot push the hernia back in place by applying gentle pressure while lying down. MAKE SURE YOU:   Understand these instructions.  Will watch your condition.  Will get help right away if you are not doing well or get worse. Document Released: 06/02/2005 Document Revised: 08/25/2011 Document Reviewed: 01/20/2008 [facility] Patient Information 2014 [place].  HERNIA REPAIR: POST OP INSTRUCTIONS  1. DIET: Follow a light bland diet the first 24 hours after arrival home, such as soup, liquids, crackers, etc.  Be sure to include lots of fluids daily.  Avoid fast food or heavy meals as your are more likely to get nauseated.  Eat a low fat the next few days after surgery. 2. Take your usually prescribed home medications unless otherwise directed. 3. PAIN CONTROL: a. Pain is best controlled by a usual combination of three different methods TOGETHER: i. Ice/Heat ii. Over the counter pain medication iii. Prescription pain medication b. Most patients will experience some swelling and bruising around the hernia(s) such as the bellybutton, groins, or old incisions.  Ice packs or heating pads (30-60 minutes up to 6 times a day) will help. Use ice for the first few days to help decrease swelling and bruising, then switch to heat to help relax tight/sore spots and speed recovery.  Some people prefer to  use ice alone, heat alone, alternating between ice & heat.  Experiment to what works for you.  Swelling and bruising can take several weeks to resolve.   c. It is helpful to take an over-the-counter pain medication regularly for the first few weeks.  Choose one of the following that works best for you: i. Naproxen (Aleve, etc)  Two 220mg tabs twice a day ii. Ibuprofen (Advil, etc) Three 200mg tabs four times a day  (every meal & bedtime) iii. Acetaminophen (Tylenol, etc) 325-650mg four times a day (every meal & bedtime) d. A  prescription for pain medication should be given to you upon discharge.  Take your pain medication as prescribed.  i. If you are having problems/concerns with the prescription medicine (does not control pain, nausea, vomiting, rash, itching, etc), please call us [phone] to see if we need to switch you to a different pain medicine that will work better for you and/or control your side effect better. ii. If you need a refill on your pain medication, please contact your pharmacy.  They will contact our office to request authorization. Prescriptions will not be filled after 5 pm or on week-ends. 4. Avoid getting constipated.  Between the surgery and the pain medications, it is common to experience some constipation.  Increasing fluid intake and taking a fiber supplement (such as Metamucil, Citrucel, FiberCon, MiraLax, etc) 1-2 times a day regularly will usually help prevent this problem from occurring.  A mild laxative (prune juice, Milk of Magnesia, MiraLax, etc) should be taken according to package directions if there are no bowel movements after 48 hours.   5. Wash / shower every day.  You may shower over the dressings as they are waterproof.   6. Remove your waterproof bandages 5 days after surgery.  You may leave the incision open to air.  You may replace a dressing/Band-Aid to cover the incision for comfort if you wish.  Continue to shower over incision(s) after the dressing is off.    7. ACTIVITIES as tolerated:   a. You may resume regular (light) daily activities beginning the next day-such as daily self-care, walking, climbing stairs-gradually increasing activities as tolerated.  If you can walk 30 minutes without difficulty, it is safe to try more intense activity such as jogging, treadmill, bicycling, low-impact aerobics, swimming, etc. b. Save the most intensive and strenuous  activity for last such as sit-ups, heavy lifting, contact sports, etc  Refrain from any heavy lifting or straining until you are off narcotics for pain control.   c. DO NOT PUSH THROUGH PAIN.  Let pain be your guide: If it hurts to do something, don't do it.  Pain is your body warning you to avoid that activity for another week until the pain goes down. d. You may drive when you are no longer taking prescription pain medication, you can comfortably wear a seatbelt, and you can safely maneuver your car and apply brakes. e. You may have sexual intercourse when it is comfortable.  8. FOLLOW UP in our office a. Please call [facility] at [phone] to set up an appointment to see your surgeon in the office for a follow-up appointment approximately 2-3 weeks after your surgery. b. Make sure that you call for this appointment the day you arrive home to insure a convenient appointment time. 9.  IF YOU HAVE DISABILITY OR FAMILY LEAVE FORMS, BRING THEM TO THE OFFICE FOR PROCESSING.  DO NOT GIVE THEM TO YOUR DOCTOR.  WHEN TO CALL US (  [phone]: 1. Poor pain control 2. Reactions / problems with new medications (rash/itching, nausea, etc)  3. Fever over 101.5 F (38.5 C) 4. Inability to urinate 5. Nausea and/or vomiting 6. Worsening swelling or bruising 7. Continued bleeding from incision. 8. Increased pain, redness, or drainage from the incision   The clinic staff is available to answer your questions during regular business hours (8:30am-5pm).  Please don't hesitate to call and ask to speak to one of our nurses for clinical concerns.   If you have a medical emergency, go to the nearest emergency room or call 911.  A surgeon from [facility] is always on call at the hospitals in [facility], [place] [address], [place], [place]  [address] ?  [address] [address], [place], [place]   [address] MAIN: [phone] ? TOLL FREE: [phone] ? FAX: [phone]  [phone] [email or web address]  Acute Urinary Retention, Male Acute urinary retention is the temporary inability to urinate. This is a common problem in older men. As men age their prostates become larger and block the flow of urine from the bladder. This is usually a problem that has come on gradually.  HOME CARE INSTRUCTIONS If you are sent home with a Foley catheter and a drainage system, you will need to discuss the best course of action with your health care provider. While the catheter is in, maintain a good intake of fluids. Keep the drainage bag emptied and lower than your catheter. This is so that contaminated urine will not flow back into your bladder, which could lead to a urinary tract infection. There are two main types of drainage bags. One is a large bag that usually is used at night. It has a good capacity that will allow you to sleep through the night without having to empty it. The second type is called a leg bag. It has a smaller capacity, so it needs to be emptied more frequently. However, the main advantage is that it can be attached by a leg strap and can go underneath your clothing, allowing you the freedom to move about or leave your home. Only take over-the-counter or prescription medicines for pain, discomfort, or fever as directed by your health care provider.  SEEK MEDICAL CARE IF:  You develop a low-grade fever.  You experience spasms or leakage of urine with the spasms. SEEK IMMEDIATE MEDICAL CARE IF:   You develop chills or fever.  Your catheter stops draining urine.  Your catheter falls out.  You start to develop increased bleeding that does not respond to rest and increased fluid intake. MAKE SURE YOU:  Understand these instructions.  Will watch your condition.  Will get help right away if you are not doing well or get worse. Document Released: 09/08/2000 Document Revised: 02/02/2013 Document Reviewed: 11/11/2012 [facility] Patient Information  2014 [place], [place].

## 2013-05-31 ENCOUNTER — Ambulatory Visit (HOSPITAL_COMMUNITY)
Admission: RE | Admit: 2013-05-31 | Discharge: 2013-05-31 | Disposition: A | Discharge status: Home / Self Care | Payer: Medicare Other | Source: Ambulatory Visit | Attending: Anesthesiology | Admitting: Anesthesiology

## 2013-05-31 ENCOUNTER — Encounter (HOSPITAL_COMMUNITY)
Admission: RE | Admit: 2013-05-31 | Discharge: 2013-05-31 | Disposition: A | Discharge status: Home / Self Care | Payer: Medicare Other | Source: Ambulatory Visit | Attending: Surgery | Admitting: Surgery

## 2013-05-31 ENCOUNTER — Encounter (HOSPITAL_COMMUNITY): Payer: Self-pay

## 2013-05-31 HISTORY — DX: Personal history of urinary calculi: Z87.442

## 2013-05-31 LAB — BASIC METABOLIC PANEL
BUN: 34 mg/dL — ABNORMAL HIGH (ref 6–23)
CO2: 28 mEq/L (ref 19–32)
Chloride: 100 mEq/L (ref 96–112)
Glucose, Bld: 113 mg/dL — ABNORMAL HIGH (ref 70–99)
Potassium: 3.9 mEq/L (ref 3.5–5.1)
Sodium: 138 mEq/L (ref 135–145)

## 2013-05-31 LAB — CBC
HCT: 45.5 % (ref 39.0–52.0)
Hemoglobin: 15.4 g/dL (ref 13.0–17.0)
MCH: 31.5 pg (ref 26.0–34.0)
MCHC: 33.8 g/dL (ref 30.0–36.0)
MCV: 93 fL (ref 78.0–100.0)
RBC: 4.89 MIL/uL (ref 4.22–5.81)

## 2013-05-31 MED ORDER — CEFAZOLIN SODIUM-DEXTROSE 2-3 GM-% IV SOLR
2.0000 g | INTRAVENOUS | Status: AC
  Administered 2013-06-01: 2 g via INTRAVENOUS
  Filled 2013-05-31: qty 50

## 2013-05-31 MED ORDER — CHLORHEXIDINE GLUCONATE 4 % EX LIQD: 1.0000 "application " | Freq: Once | CUTANEOUS | Status: DC

## 2013-05-31 NOTE — Pre-Procedure Instructions (Addendum)
Colin West  05/31/2013   Your procedure is scheduled on:  06/01/13  Report to [facility] short stay admitting at 1015 AM.  Call this number if you have problems the morning of surgery: [phone]   Remember:   Do not eat food or drink liquids after midnight.   Take these medicines the morning of surgery with A SIP OF WATER: none            STOP all herbel meds, nsaids (aleve,naproxen,advil,ibuprofen) NO DIABETIC MED AM OF SURGERY   Do not wear jewelry, make-up or nail polish.  Do not wear lotions, powders, or perfumes. You may wear deodorant.  Do not shave 48 hours prior to surgery. Men may shave face and neck.  Do not bring valuables to the hospital.  [facility] is not responsible                  for any belongings or valuables.               Contacts, dentures or bridgework may not be worn into surgery.  Leave suitcase in the car. After surgery it may be brought to your room.  For patients admitted to the hospital, discharge time is determined by your                treatment team.               Patients discharged the day of surgery will not be allowed to drive  home.  Name and phone number of your driver:   Special Instructions: Shower using CHG 2 nights before surgery and the night before surgery.  If you shower the day of surgery use CHG.  Use special wash - you have one bottle of CHG for all showers.  You should use approximately 1/3 of the bottle for each shower.   Please read over the following fact sheets that you were given: Pain Booklet, Coughing and Deep Breathing and Surgical Site Infection Prevention

## 2013-06-01 ENCOUNTER — Encounter (HOSPITAL_COMMUNITY): Payer: Self-pay | Admitting: Surgery

## 2013-06-01 ENCOUNTER — Ambulatory Visit (HOSPITAL_COMMUNITY)
Admission: RE | Admit: 2013-06-01 | Discharge: 2013-06-01 | Disposition: A | Discharge status: Home / Self Care | Payer: Medicare Other | Source: Ambulatory Visit | Attending: Surgery | Admitting: Surgery

## 2013-06-01 ENCOUNTER — Ambulatory Visit (HOSPITAL_COMMUNITY): Payer: Medicare Other | Admitting: Anesthesiology

## 2013-06-01 ENCOUNTER — Encounter (HOSPITAL_COMMUNITY): Payer: Medicare Other | Admitting: Anesthesiology

## 2013-06-01 ENCOUNTER — Encounter (HOSPITAL_COMMUNITY)
Admission: RE | Disposition: A | Discharge status: Home / Self Care | Payer: Self-pay | Source: Ambulatory Visit | Attending: Surgery

## 2013-06-01 DIAGNOSIS — F329 Major depressive disorder, single episode, unspecified: Secondary | ICD-10-CM | POA: Insufficient documentation

## 2013-06-01 DIAGNOSIS — Z87891 Personal history of nicotine dependence: Secondary | ICD-10-CM | POA: Insufficient documentation

## 2013-06-01 DIAGNOSIS — I739 Peripheral vascular disease, unspecified: Secondary | ICD-10-CM | POA: Insufficient documentation

## 2013-06-01 DIAGNOSIS — Z01812 Encounter for preprocedural laboratory examination: Secondary | ICD-10-CM | POA: Insufficient documentation

## 2013-06-01 DIAGNOSIS — K4020 Bilateral inguinal hernia, without obstruction or gangrene, not specified as recurrent: Secondary | ICD-10-CM

## 2013-06-01 DIAGNOSIS — E119 Type 2 diabetes mellitus without complications: Secondary | ICD-10-CM | POA: Insufficient documentation

## 2013-06-01 DIAGNOSIS — F411 Generalized anxiety disorder: Secondary | ICD-10-CM | POA: Insufficient documentation

## 2013-06-01 DIAGNOSIS — Z7982 Long term (current) use of aspirin: Secondary | ICD-10-CM | POA: Insufficient documentation

## 2013-06-01 DIAGNOSIS — N138 Other obstructive and reflux uropathy: Secondary | ICD-10-CM | POA: Insufficient documentation

## 2013-06-01 DIAGNOSIS — I6529 Occlusion and stenosis of unspecified carotid artery: Secondary | ICD-10-CM | POA: Insufficient documentation

## 2013-06-01 DIAGNOSIS — K4091 Unilateral inguinal hernia, without obstruction or gangrene, recurrent: Secondary | ICD-10-CM

## 2013-06-01 DIAGNOSIS — F3289 Other specified depressive episodes: Secondary | ICD-10-CM | POA: Insufficient documentation

## 2013-06-01 DIAGNOSIS — N401 Enlarged prostate with lower urinary tract symptoms: Secondary | ICD-10-CM | POA: Insufficient documentation

## 2013-06-01 DIAGNOSIS — Z01818 Encounter for other preprocedural examination: Secondary | ICD-10-CM | POA: Insufficient documentation

## 2013-06-01 DIAGNOSIS — K4090 Unilateral inguinal hernia, without obstruction or gangrene, not specified as recurrent: Secondary | ICD-10-CM

## 2013-06-01 DIAGNOSIS — I10 Essential (primary) hypertension: Secondary | ICD-10-CM | POA: Insufficient documentation

## 2013-06-01 DIAGNOSIS — Z01810 Encounter for preprocedural cardiovascular examination: Secondary | ICD-10-CM | POA: Insufficient documentation

## 2013-06-01 DIAGNOSIS — Z79899 Other long term (current) drug therapy: Secondary | ICD-10-CM | POA: Insufficient documentation

## 2013-06-01 DIAGNOSIS — N139 Obstructive and reflux uropathy, unspecified: Secondary | ICD-10-CM | POA: Insufficient documentation

## 2013-06-01 DIAGNOSIS — K4021 Bilateral inguinal hernia, without obstruction or gangrene, recurrent: Secondary | ICD-10-CM | POA: Insufficient documentation

## 2013-06-01 DIAGNOSIS — K4190 Unilateral femoral hernia, without obstruction or gangrene, not specified as recurrent: Secondary | ICD-10-CM | POA: Diagnosis present

## 2013-06-01 DIAGNOSIS — E785 Hyperlipidemia, unspecified: Secondary | ICD-10-CM | POA: Insufficient documentation

## 2013-06-01 HISTORY — PX: INSERTION OF MESH: SHX5868

## 2013-06-01 HISTORY — PX: LAPAROSCOPIC LYSIS OF ADHESIONS: SHX5905

## 2013-06-01 HISTORY — PX: INGUINAL HERNIA REPAIR: SHX194

## 2013-06-01 LAB — GLUCOSE, CAPILLARY
Glucose-Capillary: 117 mg/dL — ABNORMAL HIGH (ref 70–99)
Glucose-Capillary: 105 mg/dL — ABNORMAL HIGH (ref 70–99)
Glucose-Capillary: 131 mg/dL — ABNORMAL HIGH (ref 70–99)

## 2013-06-01 SURGERY — REPAIR, HERNIA, INGUINAL, BILATERAL, LAPAROSCOPIC
Anesthesia: General | Site: Abdomen | Laterality: Bilateral

## 2013-06-01 MED ORDER — LIDOCAINE HCL (CARDIAC) 20 MG/ML IV SOLN
INTRAVENOUS | Status: DC | PRN
  Administered 2013-06-01: 40 mg via INTRAVENOUS
  Administered 2013-06-01: 100 mg via INTRAVENOUS

## 2013-06-01 MED ORDER — NEOSTIGMINE METHYLSULFATE 1 MG/ML IJ SOLN
INTRAMUSCULAR | Status: DC | PRN
  Administered 2013-06-01: 4 mg via INTRAVENOUS

## 2013-06-01 MED ORDER — BUPIVACAINE-EPINEPHRINE 0.25% -1:200000 IJ SOLN
INTRAMUSCULAR | Status: DC | PRN
  Administered 2013-06-01: 60 mL

## 2013-06-01 MED ORDER — KETOROLAC TROMETHAMINE 30 MG/ML IJ SOLN
INTRAMUSCULAR | Status: DC | PRN
  Administered 2013-06-01: 30 mg via INTRAVENOUS

## 2013-06-01 MED ORDER — GLYCOPYRROLATE 0.2 MG/ML IJ SOLN
INTRAMUSCULAR | Status: DC | PRN
  Administered 2013-06-01: 0.1 mg via INTRAVENOUS
  Administered 2013-06-01: .6 mg via INTRAVENOUS
  Administered 2013-06-01: 0.1 mg via INTRAVENOUS

## 2013-06-01 MED ORDER — LACTATED RINGERS IV SOLN
INTRAVENOUS | Status: DC
  Administered 2013-06-01: 11:00:00 via INTRAVENOUS

## 2013-06-01 MED ORDER — ONDANSETRON HCL 4 MG/2ML IJ SOLN: 4.0000 mg | Freq: Once | INTRAMUSCULAR | Status: DC | PRN

## 2013-06-01 MED ORDER — BUPIVACAINE-EPINEPHRINE (PF) 0.25% -1:200000 IJ SOLN
INTRAMUSCULAR | Status: AC
  Filled 2013-06-01: qty 30

## 2013-06-01 MED ORDER — LACTATED RINGERS IV SOLN
INTRAVENOUS | Status: DC | PRN
  Administered 2013-06-01 (×2): via INTRAVENOUS

## 2013-06-01 MED ORDER — FENTANYL CITRATE 0.05 MG/ML IJ SOLN
INTRAMUSCULAR | Status: DC | PRN
  Administered 2013-06-01 (×6): 50 ug via INTRAVENOUS

## 2013-06-01 MED ORDER — 0.9 % SODIUM CHLORIDE (POUR BTL) OPTIME
TOPICAL | Status: DC | PRN
  Administered 2013-06-01: 2000 mL

## 2013-06-01 MED ORDER — OXYCODONE HCL 5 MG PO TABS: 5.0000 mg | ORAL_TABLET | Freq: Four times a day (QID) | ORAL | Status: DC | PRN

## 2013-06-01 MED ORDER — PROPOFOL 10 MG/ML IV BOLUS
INTRAVENOUS | Status: DC | PRN
  Administered 2013-06-01: 150 mg via INTRAVENOUS

## 2013-06-01 MED ORDER — SODIUM CHLORIDE 0.9 % IV SOLN
10.0000 mg | INTRAVENOUS | Status: DC | PRN
  Administered 2013-06-01: 10 ug/min via INTRAVENOUS

## 2013-06-01 MED ORDER — ROCURONIUM BROMIDE 100 MG/10ML IV SOLN
INTRAVENOUS | Status: DC | PRN
  Administered 2013-06-01: 5 mg via INTRAVENOUS
  Administered 2013-06-01: 40 mg via INTRAVENOUS

## 2013-06-01 MED ORDER — SODIUM CHLORIDE 0.9 % IR SOLN
Status: DC | PRN
  Administered 2013-06-01: 1000 mL

## 2013-06-01 MED ORDER — HYDROMORPHONE HCL PF 1 MG/ML IJ SOLN: 0.2500 mg | INTRAMUSCULAR | Status: DC | PRN

## 2013-06-01 MED ORDER — ONDANSETRON HCL 4 MG/2ML IJ SOLN
INTRAMUSCULAR | Status: DC | PRN
  Administered 2013-06-01: 4 mg via INTRAVENOUS

## 2013-06-01 SURGICAL SUPPLY — 44 items
APPLIER CLIP 5 13 M/L LIGAMAX5 (MISCELLANEOUS)
APR CLP MED LRG 5 ANG JAW (MISCELLANEOUS)
CANISTER SUCTION 2500CC (MISCELLANEOUS) IMPLANT
CHLORAPREP W/TINT 26ML (MISCELLANEOUS) ×2 IMPLANT
CLIP APPLIE 5 13 M/L LIGAMAX5 (MISCELLANEOUS) IMPLANT
COVER SURGICAL LIGHT HANDLE (MISCELLANEOUS) ×2 IMPLANT
DEVICE SECURE STRAP 25 ABSORB (INSTRUMENTS) ×1 IMPLANT
DRAPE UTILITY 15X26 W/TAPE STR (DRAPE) ×2 IMPLANT
DRAPE WARM FLUID 44X44 (DRAPE) ×2 IMPLANT
DRSG TEGADERM 2-3/8X2-3/4 SM (GAUZE/BANDAGES/DRESSINGS) ×2 IMPLANT
DRSG TEGADERM 4X4.75 (GAUZE/BANDAGES/DRESSINGS) ×2 IMPLANT
ELECT REM PT RETURN 9FT ADLT (ELECTROSURGICAL) ×2
ELECTRODE REM PT RTRN 9FT ADLT (ELECTROSURGICAL) ×1 IMPLANT
GAUZE SPONGE 2X2 8PLY NS (GAUZE/BANDAGES/DRESSINGS) ×1 IMPLANT
GAUZE SPONGE 2X2 8PLY STRL LF (GAUZE/BANDAGES/DRESSINGS) ×1 IMPLANT
GLOVE BIO SURGEON STRL SZ7.5 (GLOVE) ×1 IMPLANT
GLOVE BIOGEL PI IND STRL 6.5 (GLOVE) IMPLANT
GLOVE BIOGEL PI IND STRL 7.5 (GLOVE) IMPLANT
GLOVE BIOGEL PI IND STRL 8 (GLOVE) ×1 IMPLANT
GLOVE BIOGEL PI INDICATOR 6.5 (GLOVE) ×2
GLOVE BIOGEL PI INDICATOR 7.5 (GLOVE) ×1
GLOVE BIOGEL PI INDICATOR 8 (GLOVE) ×1
GLOVE ECLIPSE 8.0 STRL XLNG CF (GLOVE) ×2 IMPLANT
GLOVE SURG SS PI 6.5 STRL IVOR (GLOVE) ×1 IMPLANT
GOWN STRL NON-REIN LRG LVL3 (GOWN DISPOSABLE) ×4 IMPLANT
GOWN STRL REIN XL XLG (GOWN DISPOSABLE) ×2 IMPLANT
KIT BASIN OR (CUSTOM PROCEDURE TRAY) ×2 IMPLANT
KIT ROOM TURNOVER OR (KITS) ×2 IMPLANT
MESH ULTRAPRO 6X6 15CM15CM (Mesh General) ×3 IMPLANT
NEEDLE 22X1 1/2 (OR ONLY) (NEEDLE) ×2 IMPLANT
NS IRRIG 1000ML POUR BTL (IV SOLUTION) ×3 IMPLANT
PAD ARMBOARD 7.5X6 YLW CONV (MISCELLANEOUS) ×4 IMPLANT
SCISSORS LAP 5X35 DISP (ENDOMECHANICALS) IMPLANT
SET IRRIG TUBING LAPAROSCOPIC (IRRIGATION / IRRIGATOR) ×1 IMPLANT
SPONGE GAUZE 2X2 STER 10/PKG (GAUZE/BANDAGES/DRESSINGS) ×1
SUT MNCRL AB 4-0 PS2 18 (SUTURE) ×2 IMPLANT
SUT VIC AB 3-0 SH 27 (SUTURE) ×2
SUT VIC AB 3-0 SH 27XBRD (SUTURE) IMPLANT
TOWEL OR 17X24 6PK STRL BLUE (TOWEL DISPOSABLE) ×2 IMPLANT
TOWEL OR 17X26 10 PK STRL BLUE (TOWEL DISPOSABLE) ×2 IMPLANT
TRAY FOLEY CATH 16FRSI W/METER (SET/KITS/TRAYS/PACK) IMPLANT
TRAY LAPAROSCOPIC (CUSTOM PROCEDURE TRAY) ×2 IMPLANT
TROCAR XCEL BLUNT TIP 100MML (ENDOMECHANICALS) ×2 IMPLANT
TROCAR XCEL NON-BLD 5MMX100MML (ENDOMECHANICALS) ×4 IMPLANT

## 2013-06-01 NOTE — Preoperative (Signed)
Beta Blockers   Reason not to administer Beta Blockers:Not Applicable 

## 2013-06-01 NOTE — Anesthesia Postprocedure Evaluation (Signed)
  Anesthesia Post-op Note  Patient: Colin West  Procedure(s) Performed: Procedure(s) with comments: LAPAROSCOPIC EXPLORATION BILATERAL INGUINAL HERNIA REPAIR (Bilateral) - Left Femoral, Left Recurrent inguinal, Right Inguinal LAPAROSCOPIC LYSIS OF ADHESIONS (Bilateral) INSERTION OF MESH (Bilateral) - Left Femoral, Left Recurrent inguinal, Right Inguinal  Patient Location: PACU  Anesthesia Type:General  Level of Consciousness: awake, alert , oriented and patient cooperative  Airway and Oxygen Therapy: Patient Spontanous Breathing and Patient connected to nasal cannula oxygen  Post-op Pain: mild  Post-op Assessment: Post-op Vital signs reviewed, Patient's Cardiovascular Status Stable, Respiratory Function Stable, Patent Airway, No signs of Nausea or vomiting and Pain level controlled  Post-op Vital Signs: Reviewed and stable  Complications: No apparent anesthesia complications

## 2013-06-01 NOTE — Anesthesia Preprocedure Evaluation (Addendum)
Anesthesia Evaluation  Patient identified by MRN, date of birth, ID band Patient awake    Reviewed: Allergy & Precautions, H&P , NPO status , Patient's Chart, lab work & pertinent test results  Airway Mallampati: III TM Distance: >3 FB Neck ROM: Full    Dental  (+) Dental Advisory Given   Pulmonary former smoker,  S/p lung surgery for histoplasmosis         Cardiovascular hypertension, + Peripheral Vascular Disease     Neuro/Psych    GI/Hepatic   Endo/Other  diabetes (glu 113), Type 2, Oral Hypoglycemic Agents  Renal/GU      Musculoskeletal   Abdominal   Peds  Hematology   Anesthesia Other Findings   Reproductive/Obstetrics                        Anesthesia Physical Anesthesia Plan  ASA: II  Anesthesia Plan: General   Post-op Pain Management:    Induction: Intravenous  Airway Management Planned: Oral ETT  Additional Equipment:   Intra-op Plan:   Post-operative Plan: Extubation in OR  Informed Consent: I have reviewed the patients History and Physical, chart, labs and discussed the procedure including the risks, benefits and alternatives for the proposed anesthesia with the patient or authorized representative who has indicated his/her understanding and acceptance.     Plan Discussed with:   Anesthesia Plan Comments:         Anesthesia Quick Evaluation

## 2013-06-01 NOTE — Anesthesia Procedure Notes (Signed)
Procedure Name: Intubation Date/Time: 06/01/2013 2:25 PM Performed by: Armandina Gemma Pre-anesthesia Checklist: Patient identified, Timeout performed, Emergency Drugs available, Suction available and Patient being monitored Patient Re-evaluated:Patient Re-evaluated prior to inductionOxygen Delivery Method: Circle system utilized Preoxygenation: Pre-oxygenation with 100% oxygen Intubation Type: IV induction Ventilation: Mask ventilation without difficulty Laryngoscope Size: Miller and 2 Grade View: Grade I Tube type: Oral Tube size: 7.5 mm Number of attempts: 1 Airway Equipment and Method: Stylet Placement Confirmation: ETT inserted through vocal cords under direct vision,  breath sounds checked- equal and bilateral and positive ETCO2 Secured at: 22 cm Tube secured with: Tape Dental Injury: Teeth and Oropharynx as per pre-operative assessment  Comments: IV induction Colin West- intubation AM CRNA atraumatic teeth as preop

## 2013-06-01 NOTE — Interval H&P Note (Signed)
History and Physical Interval Note:  06/01/2013 2:11 PM  Colin West  has presented today for surgery, with the diagnosis of hernia  The various methods of treatment have been discussed with the patient and family. After consideration of risks, benefits and other options for treatment, the patient has consented to  Procedure(s): LAPAROSCOPIC EXPLORATION BILATERAL INGUINAL HERNIA REPAIR (Bilateral) as a surgical intervention .  The patient's history has been reviewed, patient examined, no change in status, stable for surgery.  I have reviewed the patient's chart and labs.  Questions were answered to the patient's satisfaction.     Maranda Marte C.

## 2013-06-01 NOTE — Transfer of Care (Signed)
Immediate Anesthesia Transfer of Care Note  Patient: Colin West  Procedure(s) Performed: Procedure(s) with comments: LAPAROSCOPIC EXPLORATION BILATERAL INGUINAL HERNIA REPAIR (Bilateral) - Left Femoral, Left Recurrent inguinal, Right Inguinal LAPAROSCOPIC LYSIS OF ADHESIONS (Bilateral) INSERTION OF MESH (Bilateral) - Left Femoral, Left Recurrent inguinal, Right Inguinal  Patient Location: PACU  Anesthesia Type:General  Level of Consciousness: awake and alert   Airway & Oxygen Therapy: Patient Spontanous Breathing and Patient connected to nasal cannula oxygen  Post-op Assessment: Report given to PACU RN and Post -op Vital signs reviewed and stable  Post vital signs: Reviewed and stable  Complications: No apparent anesthesia complications

## 2013-06-01 NOTE — Op Note (Signed)
06/01/2013  4:09 PM  PATIENT:  Colin West  79 y.o. male  Patient Care Team: [name] as PCP - General (Family Medicine) Bjorn Pippin, MD as Consulting Physician (Urology)  PRE-OPERATIVE DIAGNOSIS:  Left recurrent inguinal & new right inguinal hernia  POST-OPERATIVE DIAGNOSIS:    Bilateral direct inguinal hernias.  Left femoral hernia.  PROCEDURE:  Procedure(s): LAPAROSCOPIC EXPLORATION BILATERAL INGUINAL HERNIA REPAIR LAPAROSCOPIC LYSIS OF ADHESIONS x 30 min (1/2 case) INSERTION OF MESH  SURGEON:  Surgeon(s): Ardeth Sportsman, MD  ASSISTANT: Magnus Ivan, R.N.FA   ANESTHESIA:   local and general  EBL:     Delay start of Pharmacological VTE agent (>24hrs) due to surgical blood loss or risk of bleeding:  no  DRAINS: none   SPECIMEN:  No Specimen  DISPOSITION OF SPECIMEN:  N/A  COUNTS:  YES  PLAN OF CARE: Discharge to home after PACU  PATIENT DISPOSITION:  PACU - hemodynamically stable.  INDICATION: Pleasant active male with history of left inguinal hernia repaired in open fashion 2006.  Develop new bulging.  Also concern for hernia on the right side.  I recommended laparoscopic exploration and repair  The anatomy & physiology of the abdominal wall and pelvic floor was discussed.  The pathophysiology of hernias in the inguinal and pelvic region was discussed.  Natural history risks such as progressive enlargement, pain, incarceration & strangulation was discussed.   Contributors to complications such as smoking, obesity, diabetes, prior surgery, etc were discussed.    I feel the risks of no intervention will lead to serious problems that outweigh the operative risks; therefore, I recommended surgery to reduce and repair the hernia.  I explained laparoscopic techniques with possible need for an open approach.  I noted usual use of mesh to patch and/or buttress hernia repair  Risks such as bleeding, infection, abscess, need for further treatment, heart  attack, death, and other risks were discussed.  I noted a good likelihood this will help address the problem.   Goals of post-operative recovery were discussed as well.  Possibility that this will not correct all symptoms was explained.  I stressed the importance of low-impact activity, aggressive pain control, avoiding constipation, & not pushing through pain to minimize risk of post-operative chronic pain or injury. Possibility of reherniation was discussed.  We will work to minimize complications.     An educational handout further explaining the pathology & treatment options was given as well.  Questions were answered.  The patient expresses understanding & wishes to proceed with surgery.  OR FINDINGS: Intact mesh at the internal ring.  New direct space hernia on the left side.  Left femoral hernia as well.  Obvious right direct inguinal hernia.  A small indirect hernia on the right as well.  DESCRIPTION:   The patient was identified & brought into the operating room. The patient was positioned supine with arms tucked. SCDs were active during the entire case. The patient underwent general anesthesia without any difficulty.  The abdomen was prepped and draped in a sterile fashion. The patient's bladder was emptied.  A Surgical Timeout confirmed our plan.  I made a transverse incision through the inferior umbilical fold.  I made a small transverse nick through the anterior rectus fascia contralateral to the inguinal hernia side and placed a 0-vicryl stitch through the fascia.  I placed a Hasson trocar into the preperitoneal plane.  Entry was clean.  We induced carbon dioxide insufflation. Camera inspection revealed no injury.  I used a  10mm angled scope to bluntly free the peritoneum off the infraumbilical anterior abdominal wall.  I created enough of a preperitoneal pocket to place 5mm ports into the right & left mid-abdomen into this preperitoneal cavity.  I focused attention on the left side since  that was the dominant hernia side.   I used blunt & focused sharp dissection to free the peritoneum off the flank and down to the pubic rim.  I freed the anteriolateral bladder wall off the anteriolateral pelvic wall, sparing midline attachments.   I located a swath of peritoneum going into a hernia fascial defect at the direct space consistent with a direct hernia.  He had mesh in the preperitoneal space at the internal ring consistent with a prior indirect inguinal hernia repair. It was somewhat contracted into a mass but there is no strong evidence of recurrence at the prior repair.  I gradually freed the peritoneal hernia sac off safely and reduced it into the preperitoneal space.  I freed the peritoneum off the  spermatic vessels & vas deferens.  I freed peritoneum off the retroperitoneum along the psoas muscle.    I checked & assured hemostasis.  He had a small but definite femoral hernia as well.  I turned attention on the opposite side.  I did dissection in a similar, mirror-image fashion. The patient had a Pantaloon-type inguinal hernia.  Mainly direct over indirect hernia.   There were no breaches in peritoneum that required closure.  I chose 15x15 cm sheets of ultra-lightweight polypropylene mesh (Ultrapro), one for each side.  I cut a single sigmoid-shaped slit ~6cm from a corner of each mesh.  I placed the meshes into the preperitoneal space & laid them as overlapping diamonds such that at the inferior points, a 6x6 cm corner flap rested in the true anterolateral pelvis, covering the obturator & femoral foramina.   I allowed the bladder to fall back and help tuck the corners of the mesh in.  The medial corners overlapped each other across midline cephalad to the pubic rim.   This provided >2 inch coverage around the hernia.  Because the left direct hernia was rather large and had bilateral direct hernias, I placed a third mesh centrally in a diamond Down to the dome of the bladder to provide extra  overlap.  I did use a Secure Strap absorbable tacker along the superolateral midline and suprapubic regions to help secure the meshes together in place.  I held the hernia sacs cephalad & evacuated carbon dioxide.  I closed the fascia  With absorbable suture.  I closed the skin using 4-0 monocryl stitch.  Sterile dressings were applied. The patient was extubated & arrived in the PACU in stable condition..  I had discussed postoperative care with the patient in the holding area.   I did discuss operative findings and postoperative goals / instructions to family as well.  Instructions are written in the chart.  He is wanting to go home.  Anesthesia was worrying about him doing that.  We will see how he does.

## 2013-06-01 NOTE — H&P (View-Only) (Signed)
Subjective:     Patient ID: Colin West, male   DOB: 13-Oct-1933, 79 y.o.   MRN: [id number]  HPI  Colin West  07-Feb-1934 [id number]  Patient Care Team: [name] as PCP - General (Family Medicine) Bjorn Pippin, MD as Consulting Physician (Urology)  This patient is a 79 y.o.male who presents today for surgical evaluation at the request of Dr. Annabell Howells.   Reason for visit: Left and probable right inguinal hernias  Pleasant elderly but active male.  History of left inguinal hernia repaired in an open fashion with mesh 2006.  Has noted this past year some left groin bulging.  Has gotten larger.  Especially on the left side.  He wonders if he has something on the right side as well.  He saw his urologist in the summer.  Concern for at least a left inguinal hernia.  Surgical consultation recommended.   No exertional chest/neck/shoulder/arm pain.  Patient can walk 180 minutes for about 5 miles without difficulty.  He has had history of prostatitis with prior therapies.  He voids quite well.  Followed by Dr. Annabell Howells with urology.  No history of urinary retention.  Has a bowel movement every day.  No history of skin infections.   Patient Active Problem List   Diagnosis Date Noted  . Inguinal hernia, right 05/30/2013  . Hernia, inguinal, left - recurrent 05/30/2013  . Occlusion and stenosis of carotid artery without mention of cerebral infarction 04/01/2012  . Hypertrophy of prostate with urinary obstruction and other lower urinary tract symptoms (LUTS) 05/30/2010  . CRYPTOCOCCOSIS 03/27/2010  . ESSENTIAL HYPERTENSION 03/27/2010  . HYDROCELE 03/27/2010    Past Medical History  Diagnosis Date  . Diabetes mellitus   . Hypertension   . Hyperlipidemia   . Depression   . Anxiety   . Carotid artery occlusion     Past Surgical History  Procedure Laterality Date  . Inguinal hernia repair Left Sept 2006    Dr Corliss Skains  . Rotator cuff repair      Bilateral repair  . Lung removal,  partial  Sept. 2011     Dr. Edwyna Shell  . Elbow arthroscopy      History   Social History  . Marital Status: Divorced    Spouse Name: N/A    Number of Children: N/A  . Years of Education: N/A   Occupational History  . Not on file.   Social History Main Topics  . Smoking status: Former Games developer  . Smokeless tobacco: Never Used  . Alcohol Use: No  . Drug Use: No  . Sexual Activity: Not on file   Other Topics Concern  . Not on file   Social History Narrative  . No narrative on file    Family History  Problem Relation Age of Onset  . Heart disease Brother     Heart Disease before age 1  . Heart attack Brother     Current Outpatient Prescriptions  Medication Sig Dispense Refill  . aspirin 81 MG tablet Take 81 mg by mouth daily.        . Cholecalciferol (VITAMIN D PO) Take by mouth.        Marland Kitchen lisinopril (PRINIVIL,ZESTRIL) 20 MG tablet Take 20 mg by mouth.       Marland Kitchen LOVASTATIN PO Take by mouth daily.        Marland Kitchen METFORMIN HCL PO Take by mouth.      . Multiple Vitamin (MULTIVITAMIN PO) Take by mouth. Centrum Silver  No current facility-administered medications for this visit.     No Known Allergies  BP 142/78  Pulse 74  Temp(Src) 97.6 F (36.4 C) (Temporal)  Resp 16  Ht 5\' 9"  (1.753 m)  Wt 169 lb 12.8 oz (77.021 kg)  BMI 25.06 kg/m2  No results found.   Review of Systems  Constitutional: Negative for fever, chills and diaphoresis.  HENT: Negative for ear discharge, facial swelling, mouth sores, nosebleeds, sore throat and trouble swallowing.   Eyes: Negative for photophobia, discharge and visual disturbance.  Respiratory: Negative for choking, chest tightness, shortness of breath and stridor.   Cardiovascular: Negative for chest pain and palpitations.  Gastrointestinal: Negative for nausea, vomiting, abdominal pain, diarrhea, constipation, blood in stool, abdominal distention, anal bleeding and rectal pain.  Endocrine: Negative for cold intolerance and heat  intolerance.  Genitourinary: Positive for flank pain. Negative for dysuria, urgency, frequency, hematuria, discharge, penile swelling, scrotal swelling, enuresis, difficulty urinating, penile pain and testicular pain.  Musculoskeletal: Negative for arthralgias, gait problem, joint swelling, myalgias and neck pain.  Skin: Negative for color change, pallor, rash and wound.  Allergic/Immunologic: Negative for environmental allergies and food allergies.  Neurological: Negative for dizziness, speech difficulty, weakness, numbness and headaches.  Hematological: Negative for adenopathy. Does not bruise/bleed easily.  Psychiatric/Behavioral: Negative for hallucinations, confusion and agitation.       Objective:   Physical Exam  Constitutional: He is oriented to person, place, and time. He appears well-developed and well-nourished. No distress.  HENT:  Head: Normocephalic.  Mouth/Throat: Oropharynx is clear and moist. No oropharyngeal exudate.  Eyes: Conjunctivae and EOM are normal. Pupils are equal, round, and reactive to light. No scleral icterus.  Neck: Normal range of motion. Neck supple. No tracheal deviation present.  Cardiovascular: Normal rate, regular rhythm and intact distal pulses.   Pulmonary/Chest: Effort normal and breath sounds normal. No respiratory distress.  Abdominal: Soft. He exhibits no distension. There is no tenderness. A hernia is present. Hernia confirmed positive in the right inguinal area and confirmed positive in the left inguinal area. Hernia confirmed negative in the ventral area.    Musculoskeletal: Normal range of motion. He exhibits no tenderness.  Lymphadenopathy:    He has no cervical adenopathy.       Right: No inguinal adenopathy present.       Left: No inguinal adenopathy present.  Neurological: He is alert and oriented to person, place, and time. No cranial nerve deficit. He exhibits normal muscle tone. Coordination normal.  Skin: Skin is warm and dry. No  rash noted. He is not diaphoretic. No erythema. No pallor.  Psychiatric: He has a normal mood and affect. His behavior is normal. Judgment and thought content normal.       Assessment:     Recurrent left induced right inguinal hernias.     Plan:     I think he would benefit from surgery.  Given the recurrence and bilateral nature, recommended a laparoscopic approach.  He is very interested in this:  The anatomy & physiology of the abdominal wall and pelvic floor was discussed.  The pathophysiology of hernias in the inguinal and pelvic region was discussed.  Natural history risks such as progressive enlargement, pain, incarceration & strangulation was discussed.   Contributors to complications such as smoking, obesity, diabetes, prior surgery, etc were discussed.    I feel the risks of no intervention will lead to serious problems that outweigh the operative risks; therefore, I recommended surgery to reduce and repair the  hernia.  I explained laparoscopic techniques with possible need for an open approach.  I noted usual use of mesh to patch and/or buttress hernia repair  Risks such as bleeding, infection, abscess, need for further treatment, heart attack, death, and other risks were discussed.  I noted a good likelihood this will help address the problem.   Goals of post-operative recovery were discussed as well.  Possibility that this will not correct all symptoms was explained.  I stressed the importance of low-impact activity, aggressive pain control, avoiding constipation, & not pushing through pain to minimize risk of post-operative chronic pain or injury. Possibility of reherniation was discussed.  We will work to minimize complications.     An educational handout further explaining the pathology & treatment options was given as well.  Questions were answered.  The patient expresses understanding & wishes to proceed with surgery.

## 2013-06-02 ENCOUNTER — Encounter (HOSPITAL_COMMUNITY): Payer: Self-pay | Admitting: Surgery

## 2013-06-21 ENCOUNTER — Ambulatory Visit (INDEPENDENT_AMBULATORY_CARE_PROVIDER_SITE_OTHER): Payer: Medicare Other | Admitting: Surgery

## 2013-06-21 ENCOUNTER — Encounter (INDEPENDENT_AMBULATORY_CARE_PROVIDER_SITE_OTHER): Payer: Self-pay | Admitting: Surgery

## 2013-06-21 VITALS — BP 110/62 | HR 84 | Temp 99.0°F | Resp 14 | Ht 69.0 in | Wt 166.2 lb

## 2013-06-21 DIAGNOSIS — K4190 Unilateral femoral hernia, without obstruction or gangrene, not specified as recurrent: Secondary | ICD-10-CM

## 2013-06-21 DIAGNOSIS — K4020 Bilateral inguinal hernia, without obstruction or gangrene, not specified as recurrent: Secondary | ICD-10-CM

## 2013-06-21 NOTE — Patient Instructions (Addendum)
HERNIA REPAIR: POST OP INSTRUCTIONS  1. DIET: Follow a light bland diet the first 24 hours after arrival home, such as soup, liquids, crackers, etc.  Be sure to include lots of fluids daily.  Avoid fast food or heavy meals as your are more likely to get nauseated.  Eat a low fat the next few days after surgery. 2. Take your usually prescribed home medications unless otherwise directed. 3. PAIN CONTROL: a. Pain is best controlled by a usual combination of three different methods TOGETHER: i. Ice/Heat ii. Over the counter pain medication iii. Prescription pain medication b. Most patients will experience some swelling and bruising around the hernia(s) such as the bellybutton, groins, or old incisions.  Ice packs or heating pads (30-60 minutes up to 6 times a day) will help. Use ice for the first few days to help decrease swelling and bruising, then switch to heat to help relax tight/sore spots and speed recovery.  Some people prefer to use ice alone, heat alone, alternating between ice & heat.  Experiment to what works for you.  Swelling and bruising can take several weeks to resolve.   c. It is helpful to take an over-the-counter pain medication regularly for the first few weeks.  Choose one of the following that works best for you: i. Naproxen (Aleve, etc)  Two 253m tabs twice a day ii. Ibuprofen (Advil, etc) Three 2034mtabs four times a day (every meal & bedtime) iii. Acetaminophen (Tylenol, etc) 325-65032mour times a day (every meal & bedtime) d. A  prescription for pain medication should be given to you upon discharge.  Take your pain medication as prescribed.  i. If you are having problems/concerns with the prescription medicine (does not control pain, nausea, vomiting, rash, itching, etc), please call us [phone] see if we need to switch you to a different pain medicine that will work better for you and/or control your side effect better. ii. If you need a refill on your pain  medication, please contact your pharmacy.  They will contact our office to request authorization. Prescriptions will not be filled after 5 pm or on week-ends. 4. Avoid getting constipated.  Between the surgery and the pain medications, it is common to experience some constipation.  Increasing fluid intake and taking a fiber supplement (such as Metamucil, Citrucel, FiberCon, MiraLax, etc) 1-2 times a day regularly will usually help prevent this problem from occurring.  A mild laxative (prune juice, Milk of Magnesia, MiraLax, etc) should be taken according to package directions if there are no bowel movements after 48 hours.   5. Wash / shower every day.  You may shower over the dressings as they are waterproof.   6. Remove your waterproof bandages 5 days after surgery.  You may leave the incision open to air.  You may replace a dressing/Band-Aid to cover the incision for comfort if you wish.  Continue to shower over incision(s) after the dressing is off.    7. ACTIVITIES as tolerated:   a. You may resume regular (light) daily activities beginning the next day-such as daily self-care, walking, climbing stairs-gradually increasing activities as tolerated.  If you can walk 30 minutes without difficulty, it is safe to try more intense activity such as jogging, treadmill, bicycling, low-impact aerobics, swimming, etc. b. Save the most intensive and strenuous activity for last such as sit-ups, heavy lifting, contact sports, etc  Refrain from any heavy lifting or straining until you are off narcotics for pain control.  c. DO NOT PUSH THROUGH PAIN.  Let pain be your guide: If it hurts to do something, don't do it.  Pain is your body warning you to avoid that activity for another week until the pain goes down. d. You may drive when you are no longer taking prescription pain medication, you can comfortably wear a seatbelt, and you can safely maneuver your car and apply brakes. e. Dennis Bast may have sexual intercourse  when it is comfortable.  8. FOLLOW UP in our office a. Please call [facility] at [phone] to set up an appointment to see your surgeon in the office for a follow-up appointment approximately 2-3 weeks after your surgery. b. Make sure that you call for this appointment the day you arrive home to insure a convenient appointment time. 9.  IF YOU HAVE DISABILITY OR FAMILY LEAVE FORMS, BRING THEM TO THE OFFICE FOR PROCESSING.  DO NOT GIVE THEM TO YOUR DOCTOR.  WHEN TO CALL us [phone]: 1. Poor pain control 2. Reactions / problems with new medications (rash/itching, nausea, etc)  3. Fever over 101.5 F (38.5 C) 4. Inability to urinate 5. Nausea and/or vomiting 6. Worsening swelling or bruising 7. Continued bleeding from incision. 8. Increased pain, redness, or drainage from the incision   The clinic staff is available to answer your questions during regular business hours (8:30am-5pm).  Please don't hesitate to call and ask to speak to one of our nurses for clinical concerns.   If you have a medical emergency, go to the nearest emergency room or call 911.  A surgeon from [facility] is always on call at the hospitals in [facility], [place], [place], [place], [place]  [address] ?  [address] [address], [place], [place]   [address] MAIN: [phone] ? TOLL FREE: [phone] ? FAX: [phone] [email or web address]  GETTING TO GOOD BOWEL HEALTH. Irregular bowel habits such as constipation and diarrhea can lead to many problems over time.  Having one soft bowel movement a day is the most important way to prevent further problems.  The anorectal canal is designed to handle stretching and feces to safely manage our ability to get rid of solid waste (feces, poop, stool) out of our body.  BUT, hard constipated stools can act like ripping concrete bricks and diarrhea can be a burning fire to this very sensitive area of our body, causing  inflamed hemorrhoids, anal fissures, increasing risk is perirectal abscesses, abdominal pain/bloating, an making irritable bowel worse.     The goal: ONE SOFT BOWEL MOVEMENT A DAY!  To have soft, regular bowel movements:    Drink at least 8 tall glasses of water a day.     Take plenty of fiber.  Fiber is the undigested part of plant food that passes into the colon, acting s "natures broom" to encourage bowel motility and movement.  Fiber can absorb and hold large amounts of water. This results in a larger, bulkier stool, which is soft and easier to pass. Work gradually over several weeks up to 6 servings a day of fiber (25g a day even more if needed) in the form of: o Vegetables -- Root (potatoes, carrots, turnips), leafy green (lettuce, salad greens, celery, spinach), or cooked high residue (cabbage, broccoli, etc) o Fruit -- Fresh (unpeeled skin & pulp), Dried (prunes, apricots, cherries, etc ),  or stewed ( applesauce)  o Whole grain breads, pasta, etc (whole wheat)  o Bran cereals  Bulking Agents -- This type of water-retaining fiber generally is easily obtained each day by one of the following:  o Psyllium bran -- The psyllium plant is remarkable because its ground seeds can retain so much water. This product is available as Metamucil, Konsyl, Effersyllium, Per Diem Fiber, or the less expensive generic preparation in drug and health food stores. Although labeled a laxative, it really is not a laxative.  o Methylcellulose -- This is another fiber derived from wood which also retains water. It is available as Citrucel. o Polyethylene Glycol - and "artificial" fiber commonly called Miralax or Glycolax.  It is helpful for people with gassy or bloated feelings with regular fiber o Flax Seed - a less gassy fiber than psyllium   No reading or other relaxing activity while on the toilet. If bowel movements take longer than 5 minutes, you are too constipated   AVOID CONSTIPATION.  High fiber and water  intake usually takes care of this.  Sometimes a laxative is needed to stimulate more frequent bowel movements, but    Laxatives are not a good long-term solution as it can wear the colon out. o Osmotics (Milk of Magnesia, Fleets phosphosoda, Magnesium citrate, MiraLax, GoLytely) are safer than  o Stimulants (Senokot, Castor Oil, Dulcolax, Ex Lax)    o Do not take laxatives for more than 7days in a row.    IF SEVERELY CONSTIPATED, try a Bowel Retraining Program: o Do not use laxatives.  o Eat a diet high in roughage, such as bran cereals and leafy vegetables.  o Drink six (6) ounces of prune or apricot juice each morning.  o Eat two (2) large servings of stewed fruit each day.  o Take one (1) heaping tablespoon of a psyllium-based bulking agent twice a day. Use sugar-free sweetener when possible to avoid excessive calories.  o Eat a normal breakfast.  o Set aside 15 minutes after breakfast to sit on the toilet, but do not strain to have a bowel movement.  o If you do not have a bowel movement by the third day, use an enema and repeat the above steps.    Controlling diarrhea o Switch to liquids and simpler foods for a few days to avoid stressing your intestines further. o Avoid dairy products (especially milk & ice cream) for a short time.  The intestines often can lose the ability to digest lactose when stressed. o Avoid foods that cause gassiness or bloating.  Typical foods include beans and other legumes, cabbage, broccoli, and dairy foods.  Every person has some sensitivity to other foods, so listen to our body and avoid those foods that trigger problems for you. o Adding fiber (Citrucel, Metamucil, psyllium, Miralax) gradually can help thicken stools by absorbing excess fluid and retrain the intestines to act more normally.  Slowly increase the dose over a few weeks.  Too much fiber too soon can backfire and cause cramping & bloating. o Probiotics (such as active yogurt, Align, etc) may help  repopulate the intestines and colon with normal bacteria and calm down a sensitive digestive tract.  Most studies show it to be of mild help, though, and such products can be costly. o Medicines:   Bismuth subsalicylate (ex. Kayopectate, Pepto Bismol) every 30 minutes for up to 6 doses can help control diarrhea.  Avoid if pregnant.   Loperamide (Immodium) can slow down diarrhea.  Start with two tablets ($RemoveBefo'4mg'[id number]$  total) first and then try one tablet every 6 hours.  Avoid if you  are having fevers or severe pain.  If you are not better or start feeling worse, stop all medicines and call your doctor for advice o Call your doctor if you are getting worse or not better.  Sometimes further testing (cultures, endoscopy, X-ray studies, bloodwork, etc) may be needed to help diagnose and treat the cause of the diarrhea.  Exercise to Stay Healthy Exercise helps you become and stay healthy. EXERCISE IDEAS AND TIPS Choose exercises that:  You enjoy.  Fit into your day. You do not need to exercise really hard to be healthy. You can do exercises at a slow or medium level and stay healthy. You can:  Stretch before and after working out.  Try yoga, Pilates, or tai chi.  Lift weights.  Walk fast, swim, jog, run, climb stairs, bicycle, dance, or rollerskate.  Take aerobic classes. Exercises that burn about 150 calories:  Running 1  miles in 15 minutes.  Playing volleyball for 45 to 60 minutes.  Washing and waxing a car for 45 to 60 minutes.  Playing touch football for 45 minutes.  Walking 1  miles in 35 minutes.  Pushing a stroller 1  miles in 30 minutes.  Playing basketball for 30 minutes.  Raking leaves for 30 minutes.  Bicycling 5 miles in 30 minutes.  Walking 2 miles in 30 minutes.  Dancing for 30 minutes.  Shoveling snow for 15 minutes.  Swimming laps for 20 minutes.  Walking up stairs for 15 minutes.  Bicycling 4 miles in 15 minutes.  Gardening for 30 to 45  minutes.  Jumping rope for 15 minutes.  Washing windows or floors for 45 to 60 minutes. Document Released: 07/05/2010 Document Revised: 08/25/2011 Document Reviewed: 07/05/2010 [facility] Patient Information 2014 [place], [place].

## 2013-06-21 NOTE — Progress Notes (Signed)
Subjective:     Patient ID: Colin West, male   DOB: Aug 07, 1933, 80 y.o.   MRN: [id number]  HPI  Note: This dictation was prepared with Dragon/digital dictation along with [facility] technology. Any transcriptional errors that result from this process are unintentional.       AZAVION BOUILLON  [birth year] [id number]  Patient Care Team: [name] as PCP - General (Family Medicine) Bjorn Pippin, MD as Consulting Physician (Urology)  Procedure (Date: 06/01/2013):  POST-OPERATIVE DIAGNOSIS:  Bilateral direct inguinal hernias.  Left femoral hernia.   PROCEDURE: Procedure(s):  LAPAROSCOPIC EXPLORATION BILATERAL INGUINAL HERNIA REPAIR  LAPAROSCOPIC LYSIS OF ADHESIONS x 30 min (1/2 case)  INSERTION OF MESH  SURGEON: Surgeon(s):  Ardeth Sportsman, MD  ASSISTANT: Magnus Ivan, R.N.FA   OR FINDINGS: Intact mesh at the internal ring. New direct space hernia on the left side. Left femoral hernia as well.  Obvious right direct inguinal hernia. A small indirect hernia on the right as well.  This patient returns for surgical re-evaluation.  He is happily surprised how well things went.  Had been off day on postop day 3 but otherwise doing great.  Recovery was much faster than the initial open surgery.  No fevers or chills.  Walking well.  Urinating fine.  Normal bowel movements.  Once the new years party without difficulty.  Patient Active Problem List   Diagnosis Date Noted  . Femoral hernia - left - s/p lap repair 06/01/2013 06/01/2013  . Bilateral inguinal hernia (BIH) s/p lap repair 06/01/2013 05/30/2013  . Occlusion and stenosis of carotid artery without mention of cerebral infarction 04/01/2012  . Hypertrophy of prostate with urinary obstruction and other lower urinary tract symptoms (LUTS) 05/30/2010  . CRYPTOCOCCOSIS 03/27/2010  . ESSENTIAL HYPERTENSION 03/27/2010  . HYDROCELE 03/27/2010    Past Medical History  Diagnosis Date  . Hypertension   . Hyperlipidemia    . Anxiety   . Carotid artery occlusion   . Diabetes mellitus     denies  . Depression     denies  . History of kidney stones     Past Surgical History  Procedure Laterality Date  . Inguinal hernia repair Left Sept 2006    Dr Corliss Skains  . Rotator cuff repair      Bilateral repair  . Lung removal, partial  Sept. 2011     Dr. Edwyna Shell  . Elbow arthroscopy Right   . Inguinal hernia repair Bilateral 06/01/2013    Procedure: LAPAROSCOPIC EXPLORATION BILATERAL INGUINAL HERNIA REPAIR;  Surgeon: Ardeth Sportsman, MD;  [facility]: [facility] OR;  Service: General;  Laterality: Bilateral;  Left Femoral, Left Recurrent inguinal, Right Inguinal  . Laparoscopic lysis of adhesions Bilateral 06/01/2013    Procedure: LAPAROSCOPIC LYSIS OF ADHESIONS;  Surgeon: Ardeth Sportsman, MD;  Location: [facility] OR;  Service: General;  Laterality: Bilateral;  . Insertion of mesh Bilateral 06/01/2013    Procedure: INSERTION OF MESH;  Surgeon: Ardeth Sportsman, MD;  Location: [facility] OR;  Service: General;  Laterality: Bilateral;  Left Femoral, Left Recurrent inguinal, Right Inguinal    History   Social History  . Marital Status: Divorced    Spouse Name: N/A    Number of Children: N/A  . Years of Education: N/A   Occupational History  . Not on file.   Social History Main Topics  . Smoking status: Former Smoker    Quit date: 05/31/1973  . Smokeless tobacco: Never Used  . Alcohol Use: No  . Drug Use: No  .  Sexual Activity: Not on file   Other Topics Concern  . Not on file   Social History Narrative  . No narrative on file    Family History  Problem Relation Age of Onset  . Heart disease Brother     Heart Disease before age 78  . Heart attack Brother     Current Outpatient Prescriptions  Medication Sig Dispense Refill  . aspirin 81 MG tablet Take 81 mg by mouth daily.        . Cholecalciferol (VITAMIN D PO) Take by mouth.        Marland Kitchen. lisinopril (PRINIVIL,ZESTRIL) 20 MG tablet Take 20 mg by mouth.       . lovastatin  (MEVACOR) 20 MG tablet Take 20 mg by mouth daily.      . metFORMIN (GLUCOPHAGE-XR) 500 MG 24 hr tablet Take 500 mg by mouth daily with breakfast.      . Multiple Vitamin (MULTIVITAMIN PO) Take by mouth. Centrum Silver       No current facility-administered medications for this visit.     No Known Allergies  BP 110/62  Pulse 84  Temp(Src) 99 F (37.2 C) (Temporal)  Resp 14  Ht 5\' 9"  (1.753 m)  Wt 166 lb 3.2 oz (75.388 kg)  BMI 24.53 kg/m2  Dg Chest 2 View  05/31/2013   CLINICAL DATA:  Preop evaluation.  EXAM: CHEST  2 VIEW  COMPARISON:  07/09/2010  FINDINGS: Two views of the chest demonstrate chronic elevation of the right hemidiaphragm with volume loss in the right hemithorax. Surgical clips in the right lung. Left lung is clear. Stable appearance of the heart and mediastinum. Mild degenerative changes in the bony thorax. Postsurgical changes in the right shoulder and proximal right humerus.  IMPRESSION: Stable postoperative changes in the right hemithorax. No acute chest findings.   Electronically Signed   By: Richarda OverlieAdam  Henn M.D.   On: 05/31/2013 16:32     Review of Systems  Constitutional: Negative for fever, chills and diaphoresis.  HENT: Negative for sore throat and trouble swallowing.   Eyes: Negative for photophobia and visual disturbance.  Respiratory: Negative for choking and shortness of breath.   Cardiovascular: Negative for chest pain and palpitations.  Gastrointestinal: Negative for nausea, vomiting, abdominal distention, anal bleeding and rectal pain.  Genitourinary: Negative for dysuria, urgency, difficulty urinating and testicular pain.  Musculoskeletal: Negative for arthralgias, gait problem, myalgias and neck pain.  Skin: Negative for color change and rash.  Neurological: Negative for dizziness, speech difficulty, weakness and numbness.  Hematological: Negative for adenopathy.  Psychiatric/Behavioral: Negative for hallucinations, confusion and agitation.        Objective:   Physical Exam  Constitutional: He is oriented to person, place, and time. He appears well-developed and well-nourished. No distress.  HENT:  Head: Normocephalic.  Mouth/Throat: Oropharynx is clear and moist. No oropharyngeal exudate.  Eyes: Conjunctivae and EOM are normal. Pupils are equal, round, and reactive to light. No scleral icterus.  Neck: Normal range of motion. No tracheal deviation present.  Cardiovascular: Normal rate, normal heart sounds and intact distal pulses.   Pulmonary/Chest: Effort normal. No respiratory distress.  Abdominal: Soft. He exhibits no distension. There is no tenderness. Hernia confirmed negative in the right inguinal area and confirmed negative in the left inguinal area.  Incisions clean with normal healing ridges.  No hernias  Musculoskeletal: Normal range of motion. He exhibits no tenderness.  Neurological: He is alert and oriented to person, place, and time. No cranial nerve  deficit. He exhibits normal muscle tone. Coordination normal.  Skin: Skin is warm and dry. No rash noted. He is not diaphoretic.  Psychiatric: He has a normal mood and affect. His behavior is normal.       Assessment:     Doing well status post redo inguinal new contralateral inguinal hernias with incidental femoral hernia.  All fixed with mesh.     Plan:     I am glad he is recovered so well.  I hope he continues to improve.  Increase activity as tolerated to regular activity.  Low impact exercise such as walking an hour a day at least ideal.  Do not push through pain.  Diet as tolerated.  Low fat high fiber diet ideal.  Bowel regimen with 30 g fiber a day and fiber supplement as needed to avoid problems.  Return to clinic as needed.   Instructions discussed.  Followup with primary care physician for other health issues as would normally be done.  Questions answered.  The patient expressed understanding and appreciation

## 2015-04-13 ENCOUNTER — Other Ambulatory Visit: Payer: Self-pay | Admitting: *Deleted

## 2015-04-13 DIAGNOSIS — I6523 Occlusion and stenosis of bilateral carotid arteries: Secondary | ICD-10-CM

## 2015-04-17 ENCOUNTER — Encounter: Payer: Self-pay | Admitting: Family

## 2015-04-19 ENCOUNTER — Encounter: Payer: Self-pay | Admitting: Family

## 2015-04-19 ENCOUNTER — Ambulatory Visit (INDEPENDENT_AMBULATORY_CARE_PROVIDER_SITE_OTHER): Payer: Medicare Other | Admitting: Family

## 2015-04-19 ENCOUNTER — Telehealth: Payer: Self-pay | Admitting: Vascular Surgery

## 2015-04-19 ENCOUNTER — Ambulatory Visit (HOSPITAL_COMMUNITY)
Admission: RE | Admit: 2015-04-19 | Discharge: 2015-04-19 | Disposition: A | Discharge status: Home / Self Care | Payer: Medicare Other | Source: Ambulatory Visit | Attending: Family | Admitting: Family

## 2015-04-19 VITALS — BP 136/69 | HR 68 | Temp 97.4°F | Resp 18 | Ht 69.0 in | Wt 168.0 lb

## 2015-04-19 DIAGNOSIS — E785 Hyperlipidemia, unspecified: Secondary | ICD-10-CM | POA: Insufficient documentation

## 2015-04-19 DIAGNOSIS — I6523 Occlusion and stenosis of bilateral carotid arteries: Secondary | ICD-10-CM

## 2015-04-19 DIAGNOSIS — I10 Essential (primary) hypertension: Secondary | ICD-10-CM | POA: Diagnosis not present

## 2015-04-19 DIAGNOSIS — E119 Type 2 diabetes mellitus without complications: Secondary | ICD-10-CM | POA: Insufficient documentation

## 2015-04-19 NOTE — Progress Notes (Signed)
Established Carotid Patient   History of Present Illness  Colin West is a 81 y.o. male patient of Dr. Darrick PennaFields with no history of carotid intervention, presents today for scheduled carotid surveillance.  The patient denies any history of TIA or stroke symptoms, specifically the patient denies a history of amaurosis fugax or monocular blindness, denies a history unilateral  of facial drooping, denies a history of hemiplegia, and denies a history of receptive or expressive aphasia. Pt states his PCP referred him for carotid artery evaluation, but cannot recall why he was referred.  Pt states he had a brother that died at age 79 of an MI who was a heavy smoker, did not have DM.   He denies any history of cardiac problems.  Pt states he sees a podiatrist, denies any feet problems.  He denies any New Medical or Surgical History.  Pt Diabetic: No Pt smoker: non-smoker  Pt meds include: Statin : Yes ASA: Yes Other anticoagulants/antiplatelets: no   Past Medical History  Diagnosis Date  . Hypertension   . Hyperlipidemia   . Anxiety   . Carotid artery occlusion   . Diabetes mellitus     denies  . Depression     denies  . History of kidney stones     Social History Social History  Substance Use Topics  . Smoking status: Former Smoker    Quit date: 05/31/1973  . Smokeless tobacco: Never Used  . Alcohol Use: No    Family History Family History  Problem Relation Age of Onset  . Heart disease Brother     Heart Disease before age 860  . Heart attack Brother     Surgical History Past Surgical History  Procedure Laterality Date  . Inguinal hernia repair Left Sept 2006    Dr Corliss Skainssuei  . Rotator cuff repair      Bilateral repair  . Lung removal, partial  Sept. 2011     Dr. Edwyna ShellBurney  . Elbow arthroscopy Right   . Inguinal hernia repair Bilateral 06/01/2013    Procedure: LAPAROSCOPIC EXPLORATION BILATERAL INGUINAL HERNIA REPAIR;  Surgeon: Ardeth SportsmanSteven C. Gross, MD;  [facility]  OR;  Service: General;  Laterality: Bilateral;  Left Femoral, Left Recurrent inguinal, Right Inguinal  . Laparoscopic lysis of adhesions Bilateral 06/01/2013    Procedure: LAPAROSCOPIC LYSIS OF ADHESIONS;  Surgeon: Ardeth SportsmanSteven C. Gross, MD;  [facility]: [facility] OR;  Service: General;  Laterality: Bilateral;  . Insertion of mesh Bilateral 06/01/2013    Procedure: INSERTION OF MESH;  Surgeon: Ardeth SportsmanSteven C. Gross, MD;  Location: [facility] OR;  Service: General;  Laterality: Bilateral;  Left Femoral, Left Recurrent inguinal, Right Inguinal    No Known Allergies  Current Outpatient Prescriptions  Medication Sig Dispense Refill  . aspirin 81 MG tablet Take 81 mg by mouth daily.      . Cholecalciferol (VITAMIN D PO) Take by mouth.      Marland Kitchen. lisinopril (PRINIVIL,ZESTRIL) 20 MG tablet Take 20 mg by mouth.     . lovastatin (MEVACOR) 20 MG tablet Take 20 mg by mouth daily.    . metFORMIN (GLUCOPHAGE-XR) 500 MG 24 hr tablet Take 500 mg by mouth daily with breakfast.    . Multiple Vitamin (MULTIVITAMIN PO) Take by mouth. Centrum Silver     No current facility-administered medications for this visit.    Review of Systems : See HPI for pertinent positives and negatives.  Physical Examination  Filed Vitals:   04/19/15 1552  BP: 136/69  Pulse: 68  Temp: 97.4 F (36.3 C)  Resp: 18  Height:  (1.753 m)  Weight: 168 lb (76.204 kg)  SpO2: 96%   Body mass index is 24.8 kg/(m^2).   General: WDWN male in NAD GAIT: normal Eyes: PERRLA Pulmonary: CTAB, no rales, rhonchi, or wheezing.  Cardiac: regular rhythm, no murmur.  VASCULAR EXAM Carotid Bruits Left Right   Negative Negative   Aorta is not palpable. Radial pulses are 2+ palpable and equal.      LE Pulses LEFT RIGHT   FEMORAL  palpable  palpable    POPLITEAL not palpable  not palpable    POSTERIOR TIBIAL not palpable  not palpable    DORSALIS PEDIS  ANTERIOR TIBIAL 2+palpable  faintly palpable     Gastrointestinal: soft, nontender, BS WNL, no r/g,no palpable masses.  Musculoskeletal: No muscle atrophy/wasting. M/S 5/5 throughout, extremities without ischemic changes.  Neurologic: A&O X 3; Appropriate Affect, normal sensation, Speech is normal CN 2-12 intact, Pain and light touch intact in extremities, Motor exam as listed above.         Non-Invasive Vascular Imaging CAROTID DUPLEX 04/19/2015   <40% bilateral ICA stenoses No significant change from 04/01/2013    Assessment: Colin West is a 81 y.o. male who has no history of stroke or TIA. His brother died at age 73 of an MI but was a heavy smoker. Pt has never used tobacco and does not have DM. He is trim and fit appearing for his age. He works almost full time. Today's carotid duplex suggests minimal bilateral ICA stenoses, no significant change in two years.    Plan: Follow-up in 2 years with Carotid Duplex scan.   I discussed in depth with the patient the nature of atherosclerosis, and emphasized the importance of maximal medical management including strict control of blood pressure, blood glucose, and lipid levels, obtaining regular exercise, and continued cessation of smoking.  The patient is aware that without maximal medical management the underlying atherosclerotic disease process will progress, limiting the benefit of any interventions. The patient was given information about stroke prevention and what symptoms should prompt the patient to seek immediate medical care. Thank you for allowing [place] to participate in this patient's care.  Charisse March, RN, MSN, FNP-C [facility] Office: [phone]  Clinic Physician: Darrick Penna  04/19/2015 3:30 PM

## 2015-04-19 NOTE — Telephone Encounter (Signed)
Patient refused his 2 year follow up. York SpanielSaid he's "81 years old and don't want to do this anymore". He loved our office staff, his doctor and nurse practitioner, but does not want to continue treatment.

## 2015-04-19 NOTE — Patient Instructions (Signed)
Stroke Prevention Some medical conditions and behaviors are associated with an increased chance of having a stroke. You may prevent a stroke by making healthy choices and managing medical conditions. HOW CAN I REDUCE MY RISK OF HAVING A STROKE?   Stay physically active. Get at least 30 minutes of activity on most or all days.  Do not smoke. It may also be helpful to avoid exposure to secondhand smoke.  Limit alcohol use. Moderate alcohol use is considered to be:  No more than 2 drinks per day for men.  No more than 1 drink per day for nonpregnant women.  Eat healthy foods. This involves:  Eating 5 or more servings of fruits and vegetables a day.  Making dietary changes that address high blood pressure (hypertension), high cholesterol, diabetes, or obesity.  Manage your cholesterol levels.  Making food choices that are high in fiber and low in saturated fat, trans fat, and cholesterol may control cholesterol levels.  Take any prescribed medicines to control cholesterol as directed by your health care provider.  Manage your diabetes.  Controlling your carbohydrate and sugar intake is recommended to manage diabetes.  Take any prescribed medicines to control diabetes as directed by your health care provider.  Control your hypertension.  Making food choices that are low in salt (sodium), saturated fat, trans fat, and cholesterol is recommended to manage hypertension.  Ask your health care provider if you need treatment to lower your blood pressure. Take any prescribed medicines to control hypertension as directed by your health care provider.  If you are 18-39 years of age, have your blood pressure checked every 3-5 years. If you are 40 years of age or older, have your blood pressure checked every year.  Maintain a healthy weight.  Reducing calorie intake and making food choices that are low in sodium, saturated fat, trans fat, and cholesterol are recommended to manage  weight.  Stop drug abuse.  Avoid taking birth control pills.  Talk to your health care provider about the risks of taking birth control pills if you are over 35 years old, smoke, get migraines, or have ever had a blood clot.  Get evaluated for sleep disorders (sleep apnea).  Talk to your health care provider about getting a sleep evaluation if you snore a lot or have excessive sleepiness.  Take medicines only as directed by your health care provider.  For some people, aspirin or blood thinners (anticoagulants) are helpful in reducing the risk of forming abnormal blood clots that can lead to stroke. If you have the irregular heart rhythm of atrial fibrillation, you should be on a blood thinner unless there is a good reason you cannot take them.  Understand all your medicine instructions.  Make sure that other conditions (such as anemia or atherosclerosis) are addressed. SEEK IMMEDIATE MEDICAL CARE IF:   You have sudden weakness or numbness of the face, arm, or leg, especially on one side of the body.  Your face or eyelid droops to one side.  You have sudden confusion.  You have trouble speaking (aphasia) or understanding.  You have sudden trouble seeing in one or both eyes.  You have sudden trouble walking.  You have dizziness.  You have a loss of balance or coordination.  You have a sudden, severe headache with no known cause.  You have new chest pain or an irregular heartbeat. Any of these symptoms may represent a serious problem that is an emergency. Do not wait to see if the symptoms will   go away. Get medical help at once. Call your local emergency services (911 in [place] Do not drive yourself to the hospital.   This information is not intended to replace advice given to you by your health care provider. Make sure you discuss any questions you have with your health care provider.   Document Released: 07/10/2004 Document Revised: 06/23/2014 Document Reviewed:  12/03/2012 Elsevier Interactive Patient Education 2016 Else[employer] Inc.  

## 2016-03-23 ENCOUNTER — Inpatient Hospital Stay (HOSPITAL_COMMUNITY)
Admission: EM | Admit: 2016-03-23 | Discharge: 2016-04-07 | DRG: 439 | Disposition: A | Discharge status: Home Health | Payer: Medicare Other | Attending: Internal Medicine | Admitting: Internal Medicine

## 2016-03-23 ENCOUNTER — Emergency Department (HOSPITAL_COMMUNITY): Payer: Medicare Other

## 2016-03-23 ENCOUNTER — Encounter (HOSPITAL_COMMUNITY): Payer: Self-pay

## 2016-03-23 DIAGNOSIS — I2510 Atherosclerotic heart disease of native coronary artery without angina pectoris: Secondary | ICD-10-CM | POA: Diagnosis present

## 2016-03-23 DIAGNOSIS — K8070 Calculus of gallbladder and bile duct without cholecystitis without obstruction: Secondary | ICD-10-CM | POA: Diagnosis present

## 2016-03-23 DIAGNOSIS — K851 Biliary acute pancreatitis without necrosis or infection: Secondary | ICD-10-CM | POA: Diagnosis not present

## 2016-03-23 DIAGNOSIS — I10 Essential (primary) hypertension: Secondary | ICD-10-CM | POA: Diagnosis present

## 2016-03-23 DIAGNOSIS — Z902 Acquired absence of lung [part of]: Secondary | ICD-10-CM

## 2016-03-23 DIAGNOSIS — E440 Moderate protein-calorie malnutrition: Secondary | ICD-10-CM | POA: Insufficient documentation

## 2016-03-23 DIAGNOSIS — N400 Enlarged prostate without lower urinary tract symptoms: Secondary | ICD-10-CM | POA: Diagnosis present

## 2016-03-23 DIAGNOSIS — E860 Dehydration: Secondary | ICD-10-CM | POA: Diagnosis present

## 2016-03-23 DIAGNOSIS — Z4659 Encounter for fitting and adjustment of other gastrointestinal appliance and device: Secondary | ICD-10-CM

## 2016-03-23 DIAGNOSIS — K859 Acute pancreatitis without necrosis or infection, unspecified: Secondary | ICD-10-CM

## 2016-03-23 DIAGNOSIS — K8020 Calculus of gallbladder without cholecystitis without obstruction: Secondary | ICD-10-CM

## 2016-03-23 DIAGNOSIS — R748 Abnormal levels of other serum enzymes: Secondary | ICD-10-CM

## 2016-03-23 DIAGNOSIS — E1165 Type 2 diabetes mellitus with hyperglycemia: Secondary | ICD-10-CM | POA: Diagnosis present

## 2016-03-23 DIAGNOSIS — R1010 Upper abdominal pain, unspecified: Secondary | ICD-10-CM

## 2016-03-23 DIAGNOSIS — N202 Calculus of kidney with calculus of ureter: Secondary | ICD-10-CM | POA: Diagnosis present

## 2016-03-23 DIAGNOSIS — R1013 Epigastric pain: Secondary | ICD-10-CM | POA: Diagnosis not present

## 2016-03-23 DIAGNOSIS — Z79899 Other long term (current) drug therapy: Secondary | ICD-10-CM

## 2016-03-23 DIAGNOSIS — K8689 Other specified diseases of pancreas: Secondary | ICD-10-CM | POA: Diagnosis present

## 2016-03-23 DIAGNOSIS — E876 Hypokalemia: Secondary | ICD-10-CM | POA: Diagnosis not present

## 2016-03-23 DIAGNOSIS — Z6824 Body mass index (BMI) 24.0-24.9, adult: Secondary | ICD-10-CM

## 2016-03-23 DIAGNOSIS — Z7984 Long term (current) use of oral hypoglycemic drugs: Secondary | ICD-10-CM

## 2016-03-23 DIAGNOSIS — Z8249 Family history of ischemic heart disease and other diseases of the circulatory system: Secondary | ICD-10-CM

## 2016-03-23 DIAGNOSIS — R062 Wheezing: Secondary | ICD-10-CM

## 2016-03-23 DIAGNOSIS — R109 Unspecified abdominal pain: Secondary | ICD-10-CM | POA: Diagnosis present

## 2016-03-23 DIAGNOSIS — Z7189 Other specified counseling: Secondary | ICD-10-CM

## 2016-03-23 DIAGNOSIS — Z87442 Personal history of urinary calculi: Secondary | ICD-10-CM

## 2016-03-23 DIAGNOSIS — Z87891 Personal history of nicotine dependence: Secondary | ICD-10-CM

## 2016-03-23 DIAGNOSIS — E639 Nutritional deficiency, unspecified: Secondary | ICD-10-CM

## 2016-03-23 DIAGNOSIS — E785 Hyperlipidemia, unspecified: Secondary | ICD-10-CM | POA: Diagnosis present

## 2016-03-23 DIAGNOSIS — R000 Tachycardia, unspecified: Secondary | ICD-10-CM | POA: Diagnosis present

## 2016-03-23 DIAGNOSIS — Z66 Do not resuscitate: Secondary | ICD-10-CM | POA: Diagnosis present

## 2016-03-23 DIAGNOSIS — Z7982 Long term (current) use of aspirin: Secondary | ICD-10-CM

## 2016-03-23 LAB — DIFFERENTIAL
Basophils Absolute: 0 10*3/uL (ref 0.0–0.1)
Basophils Relative: 0 %
Eosinophils Absolute: 0 10*3/uL (ref 0.0–0.7)
Eosinophils Relative: 0 %
LYMPHS ABS: 1.2 10*3/uL (ref 0.7–4.0)
LYMPHS PCT: 5 %
Monocytes Absolute: 1.4 10*3/uL — ABNORMAL HIGH (ref 0.1–1.0)
Monocytes Relative: 6 %
NEUTROS ABS: 22.1 10*3/uL — AB (ref 1.7–7.7)
Neutrophils Relative %: 89 %

## 2016-03-23 LAB — URINALYSIS, ROUTINE W REFLEX MICROSCOPIC
Bilirubin Urine: NEGATIVE
Glucose, UA: 100 mg/dL — AB
Ketones, ur: NEGATIVE mg/dL
Leukocytes, UA: NEGATIVE
Nitrite: NEGATIVE
Protein, ur: 30 mg/dL — AB
pH: 6 (ref 5.0–8.0)

## 2016-03-23 LAB — URINE MICROSCOPIC-ADD ON: WBC, UA: NONE SEEN WBC/hpf (ref 0–5)

## 2016-03-23 LAB — I-STAT CG4 LACTIC ACID, ED: Lactic Acid, Venous: 1.52 mmol/L (ref 0.5–1.9)

## 2016-03-23 LAB — COMPREHENSIVE METABOLIC PANEL
ALK PHOS: 45 U/L (ref 38–126)
ALT: 44 U/L (ref 17–63)
ANION GAP: 11 (ref 5–15)
AST: 36 U/L (ref 15–41)
Albumin: 4.1 g/dL (ref 3.5–5.0)
BUN: 28 mg/dL — ABNORMAL HIGH (ref 6–20)
CO2: 23 mmol/L (ref 22–32)
Calcium: 8.9 mg/dL (ref 8.9–10.3)
Chloride: 103 mmol/L (ref 101–111)
Creatinine, Ser: 1.02 mg/dL (ref 0.61–1.24)
GFR calc non Af Amer: >60 mL/min (ref >60)
Glucose, Bld: 202 mg/dL — ABNORMAL HIGH (ref 65–99)
Potassium: 3.5 mmol/L (ref 3.5–5.1)
SODIUM: 137 mmol/L (ref 135–145)
Total Bilirubin: 1.6 mg/dL — ABNORMAL HIGH (ref 0.3–1.2)
Total Protein: 7.2 g/dL (ref 6.5–8.1)

## 2016-03-23 LAB — CBC
HCT: 47.6 % (ref 39.0–52.0)
HEMOGLOBIN: 17 g/dL (ref 13.0–17.0)
MCH: 33.9 pg (ref 26.0–34.0)
MCHC: 35.7 g/dL (ref 30.0–36.0)
MCV: 94.8 fL (ref 78.0–100.0)
Platelets: 220 10*3/uL (ref 150–400)
RBC: 5.02 MIL/uL (ref 4.22–5.81)
RDW: 12.9 % (ref 11.5–15.5)
WBC: 24 10*3/uL — ABNORMAL HIGH (ref 4.0–10.5)

## 2016-03-23 LAB — LIPASE, BLOOD: Lipase: 648 U/L — ABNORMAL HIGH (ref 11–51)

## 2016-03-23 MED ORDER — ONDANSETRON HCL 4 MG/2ML IJ SOLN
4.0000 mg | Freq: Four times a day (QID) | INTRAMUSCULAR | Status: DC | PRN
  Administered 2016-04-01 – 2016-04-02 (×2): 4 mg via INTRAVENOUS
  Filled 2016-03-23 (×2): qty 2

## 2016-03-23 MED ORDER — SODIUM CHLORIDE 0.9 % IV SOLN
INTRAVENOUS | Status: DC
  Administered 2016-03-23: 15:00:00 via INTRAVENOUS

## 2016-03-23 MED ORDER — ENOXAPARIN SODIUM 40 MG/0.4ML ~~LOC~~ SOLN
40.0000 mg | Freq: Every day | SUBCUTANEOUS | Status: DC
  Administered 2016-03-23 – 2016-03-24 (×2): 40 mg via SUBCUTANEOUS
  Filled 2016-03-23 (×2): qty 0.4

## 2016-03-23 MED ORDER — ONDANSETRON HCL 4 MG/2ML IJ SOLN
4.0000 mg | Freq: Once | INTRAMUSCULAR | Status: AC
  Administered 2016-03-23: 4 mg via INTRAVENOUS
  Filled 2016-03-23: qty 2

## 2016-03-23 MED ORDER — IOPAMIDOL (ISOVUE-300) INJECTION 61%
15.0000 mL | Freq: Once | INTRAVENOUS | Status: AC | PRN
  Administered 2016-03-23: 15 mL via ORAL

## 2016-03-23 MED ORDER — HYDRALAZINE HCL 20 MG/ML IJ SOLN
10.0000 mg | INTRAMUSCULAR | Status: DC | PRN
  Administered 2016-03-25: 10 mg via INTRAVENOUS
  Filled 2016-03-23: qty 1

## 2016-03-23 MED ORDER — HYDROMORPHONE HCL 1 MG/ML IJ SOLN
0.5000 mg | Freq: Once | INTRAMUSCULAR | Status: AC
  Administered 2016-03-23: 0.5 mg via INTRAVENOUS
  Filled 2016-03-23: qty 1

## 2016-03-23 MED ORDER — HYDROMORPHONE HCL 1 MG/ML IJ SOLN
1.0000 mg | INTRAMUSCULAR | Status: DC | PRN
  Administered 2016-03-23 – 2016-03-28 (×9): 1 mg via INTRAVENOUS
  Filled 2016-03-23 (×9): qty 1

## 2016-03-23 MED ORDER — CHLORHEXIDINE GLUCONATE 0.12 % MT SOLN
15.0000 mL | Freq: Two times a day (BID) | OROMUCOSAL | Status: DC
  Administered 2016-03-23 – 2016-04-05 (×23): 15 mL via OROMUCOSAL
  Filled 2016-03-23 (×23): qty 15

## 2016-03-23 MED ORDER — IOPAMIDOL (ISOVUE-300) INJECTION 61%
100.0000 mL | Freq: Once | INTRAVENOUS | Status: AC | PRN
  Administered 2016-03-23: 100 mL via INTRAVENOUS

## 2016-03-23 MED ORDER — SODIUM CHLORIDE 0.9 % IV BOLUS (SEPSIS)
1000.0000 mL | Freq: Once | INTRAVENOUS | Status: AC
  Administered 2016-03-23: 1000 mL via INTRAVENOUS

## 2016-03-23 MED ORDER — PIPERACILLIN-TAZOBACTAM 3.375 G IVPB
3.3750 g | Freq: Once | INTRAVENOUS | Status: AC
  Administered 2016-03-23: 3.375 g via INTRAVENOUS
  Filled 2016-03-23: qty 50

## 2016-03-23 MED ORDER — ORAL CARE MOUTH RINSE
15.0000 mL | Freq: Two times a day (BID) | OROMUCOSAL | Status: DC
  Administered 2016-03-23 – 2016-04-06 (×15): 15 mL via OROMUCOSAL

## 2016-03-23 MED ORDER — KCL IN DEXTROSE-NACL 20-5-0.45 MEQ/L-%-% IV SOLN
INTRAVENOUS | Status: DC
  Administered 2016-03-23 – 2016-03-24 (×2): via INTRAVENOUS
  Filled 2016-03-23 (×3): qty 1000

## 2016-03-23 NOTE — Consult Note (Signed)
Re:   Colin HasGraham N West DOB:   [birth year] MRN:   [id number]   Consultation Note  ASSESSMENT AND PLAN: 1.  Gall stone pancreatitis  Lipase - 03/23/2016 - 648  Agree with NPO, IVF, repeat labs and exam in AM  2.  Gall stones  Would plan interval cholecystectomy, when acute pancreatitis has resolved.  I gave patient information on gall bladder and surgery.  Dr. Milford Cage. Connor will be our surgeon of the week tomorrow.  I will discuss with hre.  3.  HTN 4.  History of kidney stones 5.  History of pulmonary lobectomy by Dr. Edwyna ShellBurney - 2011  For fungus infection 6.  Enlarged prostate seen on CT. 7.  Atherosclerosis seen on CT. 8.  DNR  The patient is not aware of this and this will probably need to be changed  Chief Complaint  Patient presents with  . Abdominal Pain  . Emesis   REFERRING PHYSICIAN:   [facility] Estell [facility]ammit[facility]  HISTORY OF PRESENT ILLNESS: Colin West is a 82 y.o. (DOB: [birth year])  white male whose primary care physician is RADIONTCHENKO, Roxy MannsALEXEI, MD and [facility] for gall stone pancreatitis. Family in room.  He has no chronic GI issues, though he said that he had not felt quite right for several days.  He developed sudden nausea, vomiting, and pain yesterday.  He did not sleep much last PM and [facility].  He has had no fever, jaundice, or change in bowels. He has no history of stomach disease, liver disease, pancreas  disease or colon disease. His sister died of pancreatitis in the early 1960's. His brother's sister had cholecystectomy by Dr. Donell BeersByerly about one year ago, but then died of neurologic issues.  CT scan of abdomen - 03/23/2016 - 1.  atty liver. Small gallstones in the gallbladder. Acute pancreatitis with retroperitoneal edema but no pseudocyst or abscess  2.  Advanced aortic and coronary artery calcification.  3.  Diverticulosis without evidence of diverticulitis.  4.  Enlarged prostate. WBC - 03/23/2016 - 24,000    Past  Medical History:  Diagnosis Date  . Anxiety   . Carotid artery occlusion   . Depression    denies  . Diabetes mellitus    denies  . History of kidney stones   . Hyperlipidemia   . Hypertension       Past Surgical History:  Procedure Laterality Date  . ELBOW ARTHROSCOPY Right   . INGUINAL HERNIA REPAIR Left Sept 2006   Dr Corliss Skainssuei  . INGUINAL HERNIA REPAIR Bilateral 06/01/2013   Procedure: LAPAROSCOPIC EXPLORATION BILATERAL INGUINAL HERNIA REPAIR;  Surgeon: Ardeth SportsmanSteven C. Gross, MD;  Location: [facility] OR;  Service: General;  Laterality: Bilateral;  Left Femoral, Left Recurrent inguinal, Right Inguinal  . INSERTION OF MESH Bilateral 06/01/2013   Procedure: INSERTION OF MESH;  Surgeon: Ardeth SportsmanSteven C. Gross, MD;  Location: [facility] OR;  Service: General;  Laterality: Bilateral;  Left Femoral, Left Recurrent inguinal, Right Inguinal  . LAPAROSCOPIC LYSIS OF ADHESIONS Bilateral 06/01/2013   Procedure: LAPAROSCOPIC LYSIS OF ADHESIONS;  Surgeon: Ardeth SportsmanSteven C. Gross, MD;  Location: [facility] OR;  Service: General;  Laterality: Bilateral;  . LUNG REMOVAL, PARTIAL  Sept. 2011    Dr. Edwyna ShellBurney  . ROTATOR CUFF REPAIR     Bilateral repair      Current Facility-Administered Medications  Medication Dose Route Frequency Provider Last Rate Last Dose  . 0.9 %  sodium chloride infusion   Intravenous  Continuous Meredeth Ide, MD 100 mL/hr at 03/23/16 1502    . chlorhexidine (PERIDEX) 0.12 % solution 15 mL  15 mL Mouth Rinse BID Meredeth Ide, MD   15 mL at 03/23/16 1700  . enoxaparin (LOVENOX) injection 40 mg  40 mg Subcutaneous Daily Meredeth Ide, MD   40 mg at 03/23/16 1700  . hydrALAZINE (APRESOLINE) injection 10 mg  10 mg Intravenous Q4H PRN Meredeth Ide, MD      . HYDROmorphone (DILAUDID) injection 1 mg  1 mg Intravenous Q4H PRN Meredeth Ide, MD   1 mg at 03/23/16 1700  . MEDLINE mouth rinse  15 mL Mouth Rinse q12n4p Meredeth Ide, MD      . ondansetron (ZOFRAN) injection 4 mg  4 mg Intravenous Q6H PRN Meredeth Ide, MD         No  Known Allergies  REVIEW OF SYSTEMS: Skin:  No history of rash.  No history of abnormal moles. Infection:  No history of hepatitis or HIV.  No history of MRSA. Neurologic:  No history of stroke.  No history of seizure.  No history of headaches. Cardiac:  HTN Pulmonary:  Prior right lobectomy for fungal disease.  Dr. Edwyna Shell 2011  Endocrine:  No diabetes. No thyroid disease. Gastrointestinal:  See HPI. Urologic:  History of kidney stones. Musculoskeletal:  No history of joint or back disease. Hematologic:  No bleeding disorder.  No history of anemia.  Not anticoagulated. Psycho-social:  The patient is oriented.   The patient has no obvious psychologic or social impairment to understanding our conversation and plan.  SOCIAL and FAMILY HISTORY: Divorced. Brother, Thereasa Distance, in room with patient. Also daughter, Archie Patten, and nephew, Jorja Loa. He has 4 children and one step child. He is a retired IT sales professional.  PHYSICAL EXAM: BP (!) 161/80 (BP Location: Left Arm)   Pulse 87   Temp 98.5 F (36.9 C) (Oral)   Resp 16   Ht 5\' 10"  (1.778 m)   Wt 77.1 kg (170 lb)   SpO2 94%   BMI 24.39 kg/m   General: WN older WM who is alert and generally healthy appearing.  HEENT: Normal. Pupils equal. Neck: Supple. No mass.  No thyroid mass. Lymph Nodes:  No supraclavicular or cervical nodes. Lungs: Clear to auscultation and symmetric breath sounds. Heart:  RRR. No murmur or rub. Abdomen: Soft. No mass. No hernia. Sore in the epigastrium, but no guarding.  Rare BS. Rectal: Not done. Extremities:  Good strength and ROM  in upper and lower extremities. Neurologic:  Grossly intact to motor and sensory function. Psychiatric: Has normal mood and affect. Behavior is normal.   DATA REVIEWED: Epic notes  Ovidio Kin, MD,  [facility] [address] [place]  [address]   [place], [place]    [address] Phone:  [phone] FAX:  [phone]

## 2016-03-23 NOTE — H&P (Addendum)
TRH H&P    Patient Demographics:    Colin West, is a 82 y.o. male  MRN: [id number]  DOB - January 05, 1934  Admit Date - 03/23/2016  Referring MD/NP/PA: Dr. Estell HarpinZammit  Outpatient Primary MD for the patient is Verl BangsADIONTCHENKO, ALEXEI, MD  Patient coming from: Home  Chief Complaint  Patient presents with  . Abdominal Pain  . Emesis      HPI:    Colin PattenGraham Lumb  is a 82 y.o. male, With history of depression, kidney stones, hypertension came to the ED with worsening abdominal pain and vomiting. Patient says that he vomited 4 times last night and has a constant epigastric pain. He denies diarrhea. No fever or chills. No dysuria. No chest pain or shortness of breath.  In the ED CT scan of the abdomen and pelvis revealed acute pancreatitis, ultrasound of the abdomen showed gallstones, liver enzymes are within normal range, total bili 1.6.  General surgery consulted by the ED physician    Review of systems:    In addition to the HPI above,  No Fever-chills, No Headache, No changes with Vision or hearing, No problems swallowing food or Liquids, No Chest pain, Cough or Shortness of Breath, No Blood in stool or Urine, No dysuria, No new skin rashes or bruises, No new joints pains-aches,  No new weakness, tingling, numbness in any extremity, No recent weight gain or loss, No polyuria, polydypsia or polyphagia, No significant Mental Stressors.  A full 10 point Review of Systems was done, except as stated above, all other Review of Systems were negative.   With Past History of the following :    Past Medical History:  Diagnosis Date  . Anxiety   . Carotid artery occlusion   . Depression    denies  . Diabetes mellitus    denies  . History of kidney stones   . Hyperlipidemia   . Hypertension       Past Surgical History:  Procedure Laterality Date  . ELBOW ARTHROSCOPY Right   . INGUINAL HERNIA REPAIR  Left Sept 2006   Dr Corliss Skainssuei  . INGUINAL HERNIA REPAIR Bilateral 06/01/2013   Procedure: LAPAROSCOPIC EXPLORATION BILATERAL INGUINAL HERNIA REPAIR;  Surgeon: Ardeth SportsmanSteven C. Gross, MD;  Location: [facility] OR;  Service: General;  Laterality: Bilateral;  Left Femoral, Left Recurrent inguinal, Right Inguinal  . INSERTION OF MESH Bilateral 06/01/2013   Procedure: INSERTION OF MESH;  Surgeon: Ardeth SportsmanSteven C. Gross, MD;  Location: [facility] OR;  Service: General;  Laterality: Bilateral;  Left Femoral, Left Recurrent inguinal, Right Inguinal  . LAPAROSCOPIC LYSIS OF ADHESIONS Bilateral 06/01/2013   Procedure: LAPAROSCOPIC LYSIS OF ADHESIONS;  Surgeon: Ardeth SportsmanSteven C. Gross, MD;  Location: [facility] OR;  Service: General;  Laterality: Bilateral;  . LUNG REMOVAL, PARTIAL  Sept. 2011    Dr. Edwyna ShellBurney  . ROTATOR CUFF REPAIR     Bilateral repair      Social History:      Social History  Substance Use Topics  . Smoking status: Former Smoker    Quit date: 05/31/1973  . Smokeless tobacco:  Never Used  . Alcohol use No       Family History :     Family History  Problem Relation Age of Onset  . Heart disease Brother     Heart Disease before age 45  . Heart attack Brother       Home Medications:   Prior to Admission medications   Medication Sig Start Date End Date Taking? Authorizing Provider  aspirin 81 MG tablet Take 81 mg by mouth daily.     Yes Historical Provider, MD  lisinopril (PRINIVIL,ZESTRIL) 20 MG tablet Take 40 mg by mouth every morning.    Yes Historical Provider, MD  lovastatin (MEVACOR) 20 MG tablet Take 20 mg by mouth daily.   Yes Historical Provider, MD  metFORMIN (GLUCOPHAGE-XR) 500 MG 24 hr tablet Take 500 mg by mouth daily with breakfast.   Yes Historical Provider, MD  Multiple Vitamin (MULTIVITAMIN PO) Take 1 tablet by mouth every morning. Centrum Silver   Yes Historical Provider, MD  Cholecalciferol (VITAMIN D PO) Take by mouth.      Historical Provider, MD     Allergies:    No Known Allergies    Physical Exam:   Vitals  Blood pressure 155/90, pulse 73, temperature 98.2 F (36.8 C), temperature source Oral, resp. rate 16, height 5\' 10"  (1.778 m), weight 77.1 kg (170 lb), SpO2 95 %.  1.  General: Appears in no acute distress  2. Psychiatric:  Intact judgement and  insight, awake alert, oriented x 3.  3. Neurologic: No focal neurological deficits, all cranial nerves intact.Strength 5/5 all 4 extremities, sensation intact all 4 extremities, plantars down going.  4. Eyes :  anicteric sclerae, moist conjunctivae with no lid lag. PERRLA.  5. ENMT:  Oropharynx clear with moist mucous membranes and good dentition  6. Neck:  supple, no cervical lymphadenopathy appriciated, No thyromegaly  7. Respiratory : Normal respiratory effort, good air movement bilaterally,clear to  auscultation bilaterally  8. Cardiovascular : RRR, no gallops, rubs or murmurs, no leg edema  9. Gastrointestinal:  Positive bowel sounds, abdomen soft, mild epigastric tenderness to palpation, no hepatosplenomegaly, no rigidity or guarding       10. Skin:  No cyanosis, normal texture and turgor, no rash, lesions or ulcers  11.Musculoskeletal:  Good muscle tone,  joints appear normal , no effusions,  normal range of motion    Data Review:    CBC  Recent Labs Lab 03/23/16 0819  WBC 24.0*  HGB 17.0  HCT 47.6  PLT 220  MCV 94.8  MCH 33.9  MCHC 35.7  RDW 12.9  LYMPHSABS 1.2  MONOABS 1.4*  EOSABS 0.0  BASOSABS 0.0   ------------------------------------------------------------------------------------------------------------------  Chemistries   Recent Labs Lab 03/23/16 0819  NA 137  K 3.5  CL 103  CO2 23  GLUCOSE 202*  BUN 28*  CREATININE 1.02  CALCIUM 8.9  AST 36  ALT 44  ALKPHOS 45  BILITOT 1.6*    ------------------------------------------------------------------------------------------------------------------  ------------------------------------------------------------------------------------------------------------------ GFR: Estimated Creatinine Clearance: 59.6 mL/min (by C-G formula based on SCr of 1.02 mg/dL). Liver Function Tests:  Recent Labs Lab 03/23/16 0819  AST 36  ALT 44  ALKPHOS 45  BILITOT 1.6*  PROT 7.2  ALBUMIN 4.1    Recent Labs Lab 03/23/16 0819  LIPASE 648*    --------------------------------------------------------------------------------------------------------------- Urine analysis:    Component Value Date/Time   COLORURINE YELLOW 03/23/2016 0804   APPEARANCEUR CLEAR 03/23/2016 0804   LABSPEC >1.046 (H) 03/23/2016 0804   PHURINE 6.0 03/23/2016  0804   GLUCOSEU 100 (A) 03/23/2016 0804   HGBUR TRACE (A) 03/23/2016 0804   BILIRUBINUR NEGATIVE 03/23/2016 0804   KETONESUR NEGATIVE 03/23/2016 0804   PROTEINUR 30 (A) 03/23/2016 0804   UROBILINOGEN 0.2 03/08/2010 1026   NITRITE NEGATIVE 03/23/2016 0804   LEUKOCYTESUR NEGATIVE 03/23/2016 0804      Imaging Results:    Ct Abdomen Pelvis W Contrast  Result Date: 03/23/2016 CLINICAL DATA:  Mid abdominal pain and vomiting since yesterday. History of pulmonary fungal infection. EXAM: CT ABDOMEN AND PELVIS WITH CONTRAST TECHNIQUE: Multidetector CT imaging of the abdomen and pelvis was performed using the standard protocol following bolus administration of intravenous contrast. CONTRAST:  15mL ISOVUE-300 IOPAMIDOL (ISOVUE-300) INJECTION 61%, ISOVUE-300 IOPAMIDOL (ISOVUE-300) INJECTION 61% COMPARISON:  Head CT 03/05/2010. FINDINGS: Lower chest: Bilateral pleural and parenchymal scarring right more than left. Aortic atherosclerosis and coronary artery atherosclerosis. Elevated right hemidiaphragm. Hepatobiliary: Diffuse fatty change of the liver. Small stones dependent in the gallbladder. No sign of  gallbladder inflammation or biliary ductal dilatation. Pancreas: Diffuse fatty change. Surrounding retroperitoneal edema probably indicating early pancreatitis. Spleen: Normal Adrenals/Urinary Tract: Adrenal glands normal. Kidneys normal except for a few tiny cysts. Stomach/Bowel: No acute finding. Diverticulosis without imaging evidence of diverticulitis. Vascular/Lymphatic: Aortic atherosclerosis.  No aneurysm. Reproductive: Enlarged prostate. Other: Small amount of free fluid in the pelvic peritoneal space. Musculoskeletal: Chronic degenerative changes affect the spine. IMPRESSION: Fatty liver. Small gallstones in the gallbladder. Acute pancreatitis with retroperitoneal edema but no pseudocyst or abscess. Constellation of findings suggests gallstone pancreatitis. The differential diagnosis does include duodenitis/peptic ulcer disease, but that is felt less likely. Advanced aortic and coronary artery calcification. Diverticulosis without evidence of diverticulitis. Enlarged prostate. Electronically Signed   By: Paulina Fusi M.D.   On: 03/23/2016 09:53   US Abdomen Limited Ruq  Result Date: 03/23/2016 CLINICAL DATA:  Suspected gallstone pancreatitis. EXAM: US ABDOMEN LIMITED - RIGHT UPPER QUADRANT COMPARISON:  CT abdomen pelvis earlier today. FINDINGS: Gallbladder: Multiple gallstones, largest of which is 7 mm. Mild increased gallbladder wall thickness up to 2.6 mm. No pericholecystic fluid. Negative sonographic Murphy's sign. Common bile duct: Diameter: Upper limits normal, 6.5 mm. Liver: No focal lesion identified.  Increased echogenicity. IMPRESSION: Cholelithiasis. Mild gallbladder wall thickening but no tenderness to sonographic interrogation or pericholecystic fluid. Common bile duct upper limits normal, 6.5 mm. Hepatic steatosis. Electronically Signed   By: Elsie Stain M.D.   On: 03/23/2016 12:22      Assessment & Plan:    Active Problems:   Essential hypertension   Abdominal pain   Acute  pancreatitis   1. Acute pancreatitis- placed under observation, lipase 648 we will repeat lipase in a.m. patient has likely gallstone pancreatitis, CBD measures upper limits of normal 6.5 mm, Gen. surgery has been consulted. We'll keep patient nothing by mouth, continue IV normal saline. Dilaudid when necessary for pain, Zofran. For nausea and vomiting 2. Hypertension- hold lisinopril at this time, start hydralazine 10 mg IV every 4 hours when necessary for BP more than 160/100. 3. Leukocytosis- likely from dehydration and pancreatitis, lactic acid 1.52. Will repeat CBC in a.m. 4. Hyperglycemia- blood glucose is elevated to 202, will check hemoglobin A1c   DVT Prophylaxis-   Lovenox   AM Labs Ordered, also please review Full Orders  Family Communication: Admission, patients condition and plan of care including tests being ordered have been discussed with the patient and *his son and daughter-in-law at bedside* who indicate understanding and agree with the plan and  Code Status.  Code Status: DO NOT RESUSCITATE  Admission status: Observation    Time spent in minutes : 60 minutes   Skyllar Notarianni S M.D on 03/23/2016 at 2:23 PM  Between 7am to 7pm - Pager - [phone]. After 7pm go to [email or web address] - password [email or web address]  [facility] - Office  [phone]

## 2016-03-23 NOTE — ED Notes (Signed)
He has drank all of his CT contrast without difficulty. He is drowsy and comfortable and his brother remains at bedside.

## 2016-03-23 NOTE — ED Triage Notes (Signed)
Patient reports he has had mid abdominal pain and vomiting since yesterday. Patient denies any diarrhea.

## 2016-03-23 NOTE — ED Provider Notes (Signed)
[facility]-EMERGENCY DEPT Provider Note   CSN: [id number] Arrival date & time: 03/23/16  0744     History   Chief Complaint Chief Complaint  Patient presents with  . Abdominal Pain  . Emesis    HPI Colin West is a 82 y.o. male.  Patient complains of abdominal pain with vomiting and some loose stools   The history is provided by the patient. No language interpreter was used.  Abdominal Pain   This is a new problem. The current episode started yesterday. The problem occurs constantly. The problem has not changed since onset.Associated with: Vomiting. The pain is located in the RLQ. The quality of the pain is aching. The pain is at a severity of 5/10. The pain is moderate. Associated symptoms include vomiting. Pertinent negatives include diarrhea, frequency, hematuria and headaches. Nothing aggravates the symptoms. Nothing relieves the symptoms.  Emesis   Associated symptoms include abdominal pain. Pertinent negatives include no cough, no diarrhea and no headaches.    Past Medical History:  Diagnosis Date  . Anxiety   . Carotid artery occlusion   . Depression    denies  . Diabetes mellitus    denies  . History of kidney stones   . Hyperlipidemia   . Hypertension     Patient Active Problem List   Diagnosis Date Noted  . Abdominal pain 03/23/2016  . Femoral hernia - left - s/p lap repair 06/01/2013 06/01/2013  . Bilateral inguinal hernia (BIH) s/p lap repair 06/01/2013 05/30/2013  . Occlusion and stenosis of carotid artery without mention of cerebral infarction 04/01/2012  . Hypertrophy of prostate with urinary obstruction and other lower urinary tract symptoms (LUTS) 05/30/2010  . CRYPTOCOCCOSIS 03/27/2010  . ESSENTIAL HYPERTENSION 03/27/2010  . HYDROCELE 03/27/2010    Past Surgical History:  Procedure Laterality Date  . ELBOW ARTHROSCOPY Right   . INGUINAL HERNIA REPAIR Left Sept 2006   Dr Corliss Skainssuei  . INGUINAL HERNIA REPAIR Bilateral 06/01/2013   Procedure:  LAPAROSCOPIC EXPLORATION BILATERAL INGUINAL HERNIA REPAIR;  Surgeon: Ardeth SportsmanSteven C. Gross, MD;  Location: [facility] OR;  Service: General;  Laterality: Bilateral;  Left Femoral, Left Recurrent inguinal, Right Inguinal  . INSERTION OF MESH Bilateral 06/01/2013   Procedure: INSERTION OF MESH;  Surgeon: Ardeth SportsmanSteven C. Gross, MD;  Location: [facility] OR;  Service: General;  Laterality: Bilateral;  Left Femoral, Left Recurrent inguinal, Right Inguinal  . LAPAROSCOPIC LYSIS OF ADHESIONS Bilateral 06/01/2013   Procedure: LAPAROSCOPIC LYSIS OF ADHESIONS;  Surgeon: Ardeth SportsmanSteven C. Gross, MD;  Location: [facility] OR;  Service: General;  Laterality: Bilateral;  . LUNG REMOVAL, PARTIAL  Sept. 2011    Dr. Edwyna ShellBurney  . ROTATOR CUFF REPAIR     Bilateral repair       Home Medications    Prior to Admission medications   Medication Sig Start Date End Date Taking? Authorizing Provider  aspirin 81 MG tablet Take 81 mg by mouth daily.     Yes Historical Provider, MD  lisinopril (PRINIVIL,ZESTRIL) 20 MG tablet Take 40 mg by mouth every morning.    Yes Historical Provider, MD  lovastatin (MEVACOR) 20 MG tablet Take 20 mg by mouth daily.   Yes Historical Provider, MD  metFORMIN (GLUCOPHAGE-XR) 500 MG 24 hr tablet Take 500 mg by mouth daily with breakfast.   Yes Historical Provider, MD  Multiple Vitamin (MULTIVITAMIN PO) Take 1 tablet by mouth every morning. Centrum Silver   Yes Historical Provider, MD  Cholecalciferol (VITAMIN D PO) Take by mouth.      Historical  Provider, MD    Family History Family History  Problem Relation Age of Onset  . Heart disease Brother     Heart Disease before age 60  . Heart attack Brother     Social History Social History  Substance Use Topics  . Smoking status: Former Smoker    Quit date: 05/31/1973  . Smokeless tobacco: Never Used  . Alcohol use No     Allergies   Review of patient's allergies indicates no known allergies.   Review of Systems Review of Systems  Constitutional: Negative for appetite  change and fatigue.  HENT: Negative for congestion, ear discharge and sinus pressure.   Eyes: Negative for discharge.  Respiratory: Negative for cough.   Cardiovascular: Negative for chest pain.  Gastrointestinal: Positive for abdominal pain and vomiting. Negative for diarrhea.  Genitourinary: Negative for frequency and hematuria.  Musculoskeletal: Negative for back pain.  Skin: Negative for rash.  Neurological: Negative for seizures and headaches.  Psychiatric/Behavioral: Negative for hallucinations.     Physical Exam Updated Vital Signs BP 155/90   Pulse 73   Temp 98.2 F (36.8 C) (Oral)   Resp 16   Ht 5\' 10"  (1.778 m)   Wt 170 lb (77.1 kg)   SpO2 95%   BMI 24.39 kg/m   Physical Exam  Constitutional: He is oriented to person, place, and time. He appears well-developed.  HENT:  Head: Normocephalic.  Eyes: Conjunctivae and EOM are normal. No scleral icterus.  Neck: Neck supple. No thyromegaly present.  Cardiovascular: Normal rate and regular rhythm.  Exam reveals no gallop and no friction rub.   No murmur heard. Pulmonary/Chest: No stridor. He has no wheezes. He has no rales. He exhibits no tenderness.  Abdominal: He exhibits no distension. There is tenderness. There is no rebound.  Tenderness right upper quadrant and right lower quadrant  Musculoskeletal: Normal range of motion. He exhibits no edema.  Lymphadenopathy:    He has no cervical adenopathy.  Neurological: He is oriented to person, place, and time. He exhibits normal muscle tone. Coordination normal.  Skin: No rash noted. No erythema.  Psychiatric: He has a normal mood and affect. His behavior is normal.     ED Treatments / Results  Labs (all labs ordered are listed, but only abnormal results are displayed) Labs Reviewed  LIPASE, BLOOD - Abnormal; Notable for the following:       Result Value   Lipase 648 (*)    All other components within normal limits  COMPREHENSIVE METABOLIC PANEL - Abnormal;  Notable for the following:    Glucose, Bld 202 (*)    BUN 28 (*)    Total Bilirubin 1.6 (*)    All other components within normal limits  CBC - Abnormal; Notable for the following:    WBC 24.0 (*)    All other components within normal limits  URINALYSIS, ROUTINE W REFLEX MICROSCOPIC (NOT AT [facility]) - Abnormal; Notable for the following:    Specific Gravity, Urine >1.046 (*)    Glucose, UA 100 (*)    Hgb urine dipstick TRACE (*)    Protein, ur 30 (*)    All other components within normal limits  DIFFERENTIAL - Abnormal; Notable for the following:    Neutro Abs 22.1 (*)    Monocytes Absolute 1.4 (*)    All other components within normal limits  URINE MICROSCOPIC-ADD ON - Abnormal; Notable for the following:    Squamous Epithelial / LPF 0-5 (*)    Bacteria, UA  RARE (*)    All other components within normal limits  CBC WITH DIFFERENTIAL/PLATELET  I-STAT CG4 LACTIC ACID, ED    EKG  EKG Interpretation None       Radiology Ct Abdomen Pelvis W Contrast  Result Date: 03/23/2016 CLINICAL DATA:  Mid abdominal pain and vomiting since yesterday. History of pulmonary fungal infection. EXAM: CT ABDOMEN AND PELVIS WITH CONTRAST TECHNIQUE: Multidetector CT imaging of the abdomen and pelvis was performed using the standard protocol following bolus administration of intravenous contrast. CONTRAST:  15mL ISOVUE-300 IOPAMIDOL (ISOVUE-300) INJECTION 61%, ISOVUE-300 IOPAMIDOL (ISOVUE-300) INJECTION 61% COMPARISON:  Head CT 03/05/2010. FINDINGS: Lower chest: Bilateral pleural and parenchymal scarring right more than left. Aortic atherosclerosis and coronary artery atherosclerosis. Elevated right hemidiaphragm. Hepatobiliary: Diffuse fatty change of the liver. Small stones dependent in the gallbladder. No sign of gallbladder inflammation or biliary ductal dilatation. Pancreas: Diffuse fatty change. Surrounding retroperitoneal edema probably indicating early pancreatitis. Spleen: Normal  Adrenals/Urinary Tract: Adrenal glands normal. Kidneys normal except for a few tiny cysts. Stomach/Bowel: No acute finding. Diverticulosis without imaging evidence of diverticulitis. Vascular/Lymphatic: Aortic atherosclerosis.  No aneurysm. Reproductive: Enlarged prostate. Other: Small amount of free fluid in the pelvic peritoneal space. Musculoskeletal: Chronic degenerative changes affect the spine. IMPRESSION: Fatty liver. Small gallstones in the gallbladder. Acute pancreatitis with retroperitoneal edema but no pseudocyst or abscess. Constellation of findings suggests gallstone pancreatitis. The differential diagnosis does include duodenitis/peptic ulcer disease, but that is felt less likely. Advanced aortic and coronary artery calcification. Diverticulosis without evidence of diverticulitis. Enlarged prostate. Electronically Signed   By: Paulina Fusi M.D.   On: 03/23/2016 09:53   US Abdomen Limited Ruq  Result Date: 03/23/2016 CLINICAL DATA:  Suspected gallstone pancreatitis. EXAM: US ABDOMEN LIMITED - RIGHT UPPER QUADRANT COMPARISON:  CT abdomen pelvis earlier today. FINDINGS: Gallbladder: Multiple gallstones, largest of which is 7 mm. Mild increased gallbladder wall thickness up to 2.6 mm. No pericholecystic fluid. Negative sonographic Murphy's sign. Common bile duct: Diameter: Upper limits normal, 6.5 mm. Liver: No focal lesion identified.  Increased echogenicity. IMPRESSION: Cholelithiasis. Mild gallbladder wall thickening but no tenderness to sonographic interrogation or pericholecystic fluid. Common bile duct upper limits normal, 6.5 mm. Hepatic steatosis. Electronically Signed   By: Elsie Stain M.D.   On: 03/23/2016 12:22    Procedures Procedures (including critical care time)  Medications Ordered in ED Medications  HYDROmorphone (DILAUDID) injection 0.5 mg (0.5 mg Intravenous Given 03/23/16 0847)  ondansetron (ZOFRAN) injection 4 mg (4 mg Intravenous Given 03/23/16 0847)  iopamidol  (ISOVUE-300) 61 % injection 15 mL (15 mLs Oral Contrast Given 03/23/16 0859)  iopamidol (ISOVUE-300) 61 % injection 100 mL (100 mLs Intravenous Contrast Given 03/23/16 0935)  sodium chloride 0.9 % bolus 1,000 mL (1,000 mLs Intravenous New Bag/Given 03/23/16 1123)  piperacillin-tazobactam (ZOSYN) IVPB 3.375 g (0 g Intravenous Stopped 03/23/16 1255)  HYDROmorphone (DILAUDID) injection 0.5 mg (0.5 mg Intravenous Given 03/23/16 1209)     Initial Impression / Assessment and Plan / ED Course  I have reviewed the triage vital signs and the nursing notes.  Pertinent labs & imaging results that were available during my care of the patient were reviewed by me and considered in my medical decision making (see chart for details).  Clinical Course    CT scan shows pancreatitis. Ultrasound shows gallstones. I spoke with general surgery and they will consult on the patient but what medicine to admit  Final Clinical Impressions(s) / ED Diagnoses   Final diagnoses:  Pain of  upper abdomen    New Prescriptions New Prescriptions   No medications on file     Bethann Berkshire, MD 03/23/16 1352

## 2016-03-24 ENCOUNTER — Observation Stay (HOSPITAL_COMMUNITY): Payer: Medicare Other

## 2016-03-24 DIAGNOSIS — R000 Tachycardia, unspecified: Secondary | ICD-10-CM | POA: Diagnosis present

## 2016-03-24 DIAGNOSIS — E876 Hypokalemia: Secondary | ICD-10-CM | POA: Diagnosis not present

## 2016-03-24 DIAGNOSIS — D72829 Elevated white blood cell count, unspecified: Secondary | ICD-10-CM

## 2016-03-24 DIAGNOSIS — Z6824 Body mass index (BMI) 24.0-24.9, adult: Secondary | ICD-10-CM | POA: Diagnosis not present

## 2016-03-24 DIAGNOSIS — E440 Moderate protein-calorie malnutrition: Secondary | ICD-10-CM | POA: Diagnosis not present

## 2016-03-24 DIAGNOSIS — K8070 Calculus of gallbladder and bile duct without cholecystitis without obstruction: Secondary | ICD-10-CM | POA: Diagnosis not present

## 2016-03-24 DIAGNOSIS — K8020 Calculus of gallbladder without cholecystitis without obstruction: Secondary | ICD-10-CM | POA: Diagnosis not present

## 2016-03-24 DIAGNOSIS — I10 Essential (primary) hypertension: Secondary | ICD-10-CM | POA: Diagnosis not present

## 2016-03-24 DIAGNOSIS — Z7984 Long term (current) use of oral hypoglycemic drugs: Secondary | ICD-10-CM | POA: Diagnosis not present

## 2016-03-24 DIAGNOSIS — K851 Biliary acute pancreatitis without necrosis or infection: Secondary | ICD-10-CM | POA: Diagnosis not present

## 2016-03-24 DIAGNOSIS — Z87442 Personal history of urinary calculi: Secondary | ICD-10-CM | POA: Diagnosis not present

## 2016-03-24 DIAGNOSIS — Z87891 Personal history of nicotine dependence: Secondary | ICD-10-CM | POA: Diagnosis not present

## 2016-03-24 DIAGNOSIS — I2510 Atherosclerotic heart disease of native coronary artery without angina pectoris: Secondary | ICD-10-CM | POA: Diagnosis not present

## 2016-03-24 DIAGNOSIS — Z902 Acquired absence of lung [part of]: Secondary | ICD-10-CM | POA: Diagnosis not present

## 2016-03-24 DIAGNOSIS — Z7982 Long term (current) use of aspirin: Secondary | ICD-10-CM | POA: Diagnosis not present

## 2016-03-24 DIAGNOSIS — Z79899 Other long term (current) drug therapy: Secondary | ICD-10-CM | POA: Diagnosis not present

## 2016-03-24 DIAGNOSIS — E1165 Type 2 diabetes mellitus with hyperglycemia: Secondary | ICD-10-CM | POA: Diagnosis not present

## 2016-03-24 DIAGNOSIS — E860 Dehydration: Secondary | ICD-10-CM | POA: Diagnosis not present

## 2016-03-24 DIAGNOSIS — E785 Hyperlipidemia, unspecified: Secondary | ICD-10-CM | POA: Diagnosis present

## 2016-03-24 DIAGNOSIS — N202 Calculus of kidney with calculus of ureter: Secondary | ICD-10-CM | POA: Diagnosis not present

## 2016-03-24 DIAGNOSIS — N400 Enlarged prostate without lower urinary tract symptoms: Secondary | ICD-10-CM | POA: Diagnosis not present

## 2016-03-24 DIAGNOSIS — K8689 Other specified diseases of pancreas: Secondary | ICD-10-CM | POA: Diagnosis not present

## 2016-03-24 DIAGNOSIS — Z66 Do not resuscitate: Secondary | ICD-10-CM | POA: Diagnosis not present

## 2016-03-24 DIAGNOSIS — R1013 Epigastric pain: Secondary | ICD-10-CM | POA: Diagnosis present

## 2016-03-24 DIAGNOSIS — Z8249 Family history of ischemic heart disease and other diseases of the circulatory system: Secondary | ICD-10-CM | POA: Diagnosis not present

## 2016-03-24 LAB — COMPREHENSIVE METABOLIC PANEL
ALBUMIN: 3.7 g/dL (ref 3.5–5.0)
ALK PHOS: 42 U/L (ref 38–126)
ALT: 36 U/L (ref 17–63)
ANION GAP: 7 (ref 5–15)
AST: 41 U/L (ref 15–41)
BILIRUBIN TOTAL: 2.1 mg/dL — AB (ref 0.3–1.2)
BUN: 24 mg/dL — AB (ref 6–20)
CALCIUM: 8.1 mg/dL — AB (ref 8.9–10.3)
CO2: 25 mmol/L (ref 22–32)
Chloride: 104 mmol/L (ref 101–111)
Creatinine, Ser: 0.99 mg/dL (ref 0.61–1.24)
GFR calc Af Amer: >60 mL/min (ref >60)
GLUCOSE: 227 mg/dL — AB (ref 65–99)
POTASSIUM: 4.7 mmol/L (ref 3.5–5.1)
Sodium: 136 mmol/L (ref 135–145)
TOTAL PROTEIN: 6.7 g/dL (ref 6.5–8.1)

## 2016-03-24 LAB — CBC
HCT: 48.5 % (ref 39.0–52.0)
HEMOGLOBIN: 16.5 g/dL (ref 13.0–17.0)
MCH: 33.2 pg (ref 26.0–34.0)
MCHC: 34 g/dL (ref 30.0–36.0)
MCV: 97.6 fL (ref 78.0–100.0)
Platelets: 204 10*3/uL (ref 150–400)
RBC: 4.97 MIL/uL (ref 4.22–5.81)
RDW: 13.4 % (ref 11.5–15.5)
WBC: 37.2 10*3/uL — AB (ref 4.0–10.5)

## 2016-03-24 LAB — LIPASE, BLOOD: LIPASE: 165 U/L — AB (ref 11–51)

## 2016-03-24 LAB — HEMOGLOBIN A1C
HEMOGLOBIN A1C: 5.9 % — AB (ref 4.8–5.6)
Mean Plasma Glucose: 123 mg/dL

## 2016-03-24 LAB — PROCALCITONIN: PROCALCITONIN: 0.17 ng/mL

## 2016-03-24 MED ORDER — METOPROLOL TARTRATE 5 MG/5ML IV SOLN
2.5000 mg | Freq: Three times a day (TID) | INTRAVENOUS | Status: DC
  Administered 2016-03-24: 2.5 mg via INTRAVENOUS
  Filled 2016-03-24: qty 5

## 2016-03-24 MED ORDER — METRONIDAZOLE IN NACL 5-0.79 MG/ML-% IV SOLN
500.0000 mg | Freq: Three times a day (TID) | INTRAVENOUS | Status: DC
  Administered 2016-03-24 – 2016-03-27 (×9): 500 mg via INTRAVENOUS
  Filled 2016-03-24 (×9): qty 100

## 2016-03-24 MED ORDER — METOPROLOL TARTRATE 5 MG/5ML IV SOLN
5.0000 mg | Freq: Four times a day (QID) | INTRAVENOUS | Status: DC
  Administered 2016-03-25 – 2016-04-02 (×34): 5 mg via INTRAVENOUS
  Filled 2016-03-24 (×34): qty 5

## 2016-03-24 MED ORDER — MORPHINE SULFATE (PF) 2 MG/ML IV SOLN
2.0000 mg | INTRAVENOUS | Status: DC | PRN
  Administered 2016-03-24 – 2016-03-25 (×10): 4 mg via INTRAVENOUS
  Filled 2016-03-24 (×10): qty 2

## 2016-03-24 MED ORDER — CIPROFLOXACIN IN D5W 400 MG/200ML IV SOLN
400.0000 mg | Freq: Two times a day (BID) | INTRAVENOUS | Status: DC
  Administered 2016-03-24 – 2016-03-27 (×6): 400 mg via INTRAVENOUS
  Filled 2016-03-24 (×6): qty 200

## 2016-03-24 MED ORDER — MORPHINE SULFATE (PF) 2 MG/ML IV SOLN
2.0000 mg | INTRAVENOUS | Status: DC | PRN
  Administered 2016-03-24: 4 mg via INTRAVENOUS
  Filled 2016-03-24: qty 2

## 2016-03-24 MED ORDER — ENOXAPARIN SODIUM 40 MG/0.4ML ~~LOC~~ SOLN
40.0000 mg | Freq: Every day | SUBCUTANEOUS | Status: DC
Start: 2016-03-25 — End: 2016-04-07
  Administered 2016-03-25 – 2016-04-07 (×14): 40 mg via SUBCUTANEOUS
  Filled 2016-03-24 (×14): qty 0.4

## 2016-03-24 MED ORDER — FAMOTIDINE IN NACL 20-0.9 MG/50ML-% IV SOLN
20.0000 mg | Freq: Two times a day (BID) | INTRAVENOUS | Status: DC
  Administered 2016-03-24 – 2016-04-01 (×18): 20 mg via INTRAVENOUS
  Filled 2016-03-24 (×18): qty 50

## 2016-03-24 MED ORDER — MORPHINE SULFATE (PF) 2 MG/ML IV SOLN
2.0000 mg | INTRAVENOUS | Status: DC | PRN
  Administered 2016-03-24: 2 mg via INTRAVENOUS
  Filled 2016-03-24: qty 1

## 2016-03-24 MED ORDER — DIPHENHYDRAMINE HCL 50 MG/ML IJ SOLN
12.5000 mg | Freq: Once | INTRAMUSCULAR | Status: AC
  Administered 2016-03-24: 12.5 mg via INTRAVENOUS
  Filled 2016-03-24: qty 1

## 2016-03-24 MED ORDER — GADOBENATE DIMEGLUMINE 529 MG/ML IV SOLN
17.0000 mL | Freq: Once | INTRAVENOUS | Status: AC | PRN
  Administered 2016-03-24: 17 mL via INTRAVENOUS

## 2016-03-24 MED ORDER — SODIUM CHLORIDE 0.9 % IV SOLN
INTRAVENOUS | Status: DC
  Administered 2016-03-24: 150 mL via INTRAVENOUS
  Administered 2016-03-24: 23:00:00 via INTRAVENOUS
  Administered 2016-03-25 (×2): 150 mL/h via INTRAVENOUS
  Administered 2016-03-26: 02:00:00 via INTRAVENOUS

## 2016-03-24 NOTE — Progress Notes (Signed)
PROGRESS NOTE  Colin West GLO:[id number] DOB: [birth year] DOA: 03/23/2016 PCP: Verl BangsADIONTCHENKO, ALEXEI, MD  HPI/Recap of past 24 hours:  In pain request increase pain meds frequency, no fever, no vomiting, daughter and brothers in room  Assessment/Plan: Active Problems:   Essential hypertension   Abdominal pain   Acute pancreatitis  1. Acute pancreatitis-, lipase 648 on admission, repeat on 10/9 is 165, likely gallstone pancreatitis, CBD measures upper limits of normal 6.5 mm, Gen. surgery has been consulted. keep nothing by mouth, continue IVF. Patient state pain is not well controlled, adjust prn pain meds,MRCP pending 2. Leukocytosis- likely from dehydration and stress likely,Wbc continue to increase, no fever, bps stable, will start empirically abx with iv cipro/flagyl for now,  lactic acid 1.52. procalcitonin is  0.17, doubt infection. Will continue cycle lactic acid and procacitonin. mrcp pending. 3. HypertensioN- hold lisinopril at this time, start lopressor iv ,continue  hydralazine 10 mg IV prn . 4.  Hyperglycemia- blood glucose is elevated to 202, hemoglobin A1c 5.9   DVT Prophylaxis-   Lovenox , hold lovenox if surgery planned   Family Communication: patient and *his son and daughter-in-law at bedside* updated by admitting MD, I talked to daughter and his two brothers in room on 10/9.  Code Status:  per surgery note DNR is not correct, admitting MD talked to patient on admission on 10/8 , patient states he is DNR, I talked to the patient on 10/9, patient states he has a living will states he is DNR. DNR ordered.  Disposition Plan: remain in the hospital   Consultants:  General surgery  Procedures:  none  Antibiotics:  none   Objective: BP (!) 157/91 (BP Location: Left Arm)   Pulse 99   Temp 98 F (36.7 C) (Oral)   Resp 18   Ht 5\' 10"  (1.778 m)   Wt 77.1 kg (170 lb)   SpO2 93%   BMI 24.39 kg/m   Intake/Output Summary (Last 24 hours) at  03/24/16 0755 Last data filed at 03/24/16 0734  Gross per 24 hour  Intake          1909.17 ml  Output              600 ml  Net          1309.17 ml   Filed Weights   03/23/16 0759  Weight: 77.1 kg (170 lb)    Exam:   General:  in pain  Cardiovascular: RRR  Respiratory: CTABL  Abdomen: +tender right upper quadrant,  positive BS  Musculoskeletal: No Edema  Neuro: aaox3  Data Reviewed: Basic Metabolic Panel:  Recent Labs Lab 03/23/16 0819 03/24/16 0416  NA 137 136  K 3.5 4.7  CL 103 104  CO2 23 25  GLUCOSE 202* 227*  BUN 28* 24*  CREATININE 1.02 0.99  CALCIUM 8.9 8.1*   Liver Function Tests:  Recent Labs Lab 03/23/16 0819 03/24/16 0416  AST 36 41  ALT 44 36  ALKPHOS 45 42  BILITOT 1.6* 2.1*  PROT 7.2 6.7  ALBUMIN 4.1 3.7    Recent Labs Lab 03/23/16 0819 03/24/16 0416  LIPASE 648* 165*   No results for input(s): AMMONIA in the last 168 hours. CBC:  Recent Labs Lab 03/23/16 0819 03/24/16 0416  WBC 24.0* 37.2*  NEUTROABS 22.1*  --   HGB 17.0 16.5  HCT 47.6 48.5  MCV 94.8 97.6  PLT 220 204   Cardiac Enzymes:   No results for input(s): CKTOTAL, CKMB, CKMBINDEX,  TROPONINI in the last 168 hours. BNP (last 3 results) No results for input(s): BNP in the last 8760 hours.  ProBNP (last 3 results) No results for input(s): PROBNP in the last 8760 hours.  CBG: No results for input(s): GLUCAP in the last 168 hours.  No results found for this or any previous visit (from the past 240 hour(s)).   Studies: Ct Abdomen Pelvis W Contrast  Result Date: 03/23/2016 CLINICAL DATA:  Mid abdominal pain and vomiting since yesterday. History of pulmonary fungal infection. EXAM: CT ABDOMEN AND PELVIS WITH CONTRAST TECHNIQUE: Multidetector CT imaging of the abdomen and pelvis was performed using the standard protocol following bolus administration of intravenous contrast. CONTRAST:  15mL ISOVUE-300 IOPAMIDOL (ISOVUE-300) INJECTION 61%, ISOVUE-300  IOPAMIDOL (ISOVUE-300) INJECTION 61% COMPARISON:  Head CT 03/05/2010. FINDINGS: Lower chest: Bilateral pleural and parenchymal scarring right more than left. Aortic atherosclerosis and coronary artery atherosclerosis. Elevated right hemidiaphragm. Hepatobiliary: Diffuse fatty change of the liver. Small stones dependent in the gallbladder. No sign of gallbladder inflammation or biliary ductal dilatation. Pancreas: Diffuse fatty change. Surrounding retroperitoneal edema probably indicating early pancreatitis. Spleen: Normal Adrenals/Urinary Tract: Adrenal glands normal. Kidneys normal except for a few tiny cysts. Stomach/Bowel: No acute finding. Diverticulosis without imaging evidence of diverticulitis. Vascular/Lymphatic: Aortic atherosclerosis.  No aneurysm. Reproductive: Enlarged prostate. Other: Small amount of free fluid in the pelvic peritoneal space. Musculoskeletal: Chronic degenerative changes affect the spine. IMPRESSION: Fatty liver. Small gallstones in the gallbladder. Acute pancreatitis with retroperitoneal edema but no pseudocyst or abscess. Constellation of findings suggests gallstone pancreatitis. The differential diagnosis does include duodenitis/peptic ulcer disease, but that is felt less likely. Advanced aortic and coronary artery calcification. Diverticulosis without evidence of diverticulitis. Enlarged prostate. Electronically Signed   By: Paulina Fusi M.D.   On: 03/23/2016 09:53   US Abdomen Limited Ruq  Result Date: 03/23/2016 CLINICAL DATA:  Suspected gallstone pancreatitis. EXAM: US ABDOMEN LIMITED - RIGHT UPPER QUADRANT COMPARISON:  CT abdomen pelvis earlier today. FINDINGS: Gallbladder: Multiple gallstones, largest of which is 7 mm. Mild increased gallbladder wall thickness up to 2.6 mm. No pericholecystic fluid. Negative sonographic Murphy's sign. Common bile duct: Diameter: Upper limits normal, 6.5 mm. Liver: No focal lesion identified.  Increased echogenicity. IMPRESSION:  Cholelithiasis. Mild gallbladder wall thickening but no tenderness to sonographic interrogation or pericholecystic fluid. Common bile duct upper limits normal, 6.5 mm. Hepatic steatosis. Electronically Signed   By: Elsie Stain M.D.   On: 03/23/2016 12:22    Scheduled Meds: . chlorhexidine  15 mL Mouth Rinse BID  . enoxaparin (LOVENOX) injection  40 mg Subcutaneous Daily  . mouth rinse  15 mL Mouth Rinse q12n4p    Continuous Infusions: . dextrose 5 % and 0.45 % NaCl with KCl 20 mEq/L 150 mL/hr at 03/24/16 0102     Time spent:  Arryn Terrones MD, PhD  [facility] Pager [phone]. If 7PM-7AM, please contact night-coverage at [email or web address], password [facility] 03/24/2016, 7:55 AM  LOS: 1 day

## 2016-03-24 NOTE — Progress Notes (Signed)
Day nurse reported that even though pt is listed as DNR, that is incorrect.  Please clarify.

## 2016-03-24 NOTE — Progress Notes (Signed)
Patient ID: Colin West, male   DOB: Oct 29, 1933, 82 y.o.   MRN: [id number]  [facility]     Subjective: Patient reports worsening abdominal pain. Pain is suprapubic and RUQ. Denies any current nausea, vomiting, or dysuria. Denies CP or SOB.  Objective: Vital signs in last 24 hours: Temp:  [97.3 F (36.3 C)-98.5 F (36.9 C)] 98 F (36.7 C) (10/08 2100) Pulse Rate:  [71-99] 99 (10/08 2100) Resp:  [16-18] 18 (10/08 2100) BP: (155-179)/(73-91) 157/91 (10/08 2100) SpO2:  [93 %-96 %] 93 % (10/08 2100) Last BM Date: 03/22/16  Intake/Output from previous day: 10/08 0701 - 10/09 0700 In: 1909.2 [I.V.:1909.2] Out: 300 [Urine:300] Intake/Output this shift: Total I/O In: -  Out: 300 [Urine:300]  PE: Gen:  Alert, pleasant, uncomfortable Card:  RRR Pulm:  CTAB Abd: Soft, TTP RUQ/suprapubic/epigastric region  Lab Results:   Recent Labs  03/23/16 0819 03/24/16 0416  WBC 24.0* 37.2*  HGB 17.0 16.5  HCT 47.6 48.5  PLT 220 204   BMET  Recent Labs  03/23/16 0819 03/24/16 0416  NA 137 136  K 3.5 4.7  CL 103 104  CO2 23 25  GLUCOSE 202* 227*  BUN 28* 24*  CREATININE 1.02 0.99  CALCIUM 8.9 8.1*   PT/INR No results for input(s): LABPROT, INR in the last 72 hours. CMP     Component Value Date/Time   NA 136 03/24/2016 0416   K 4.7 03/24/2016 0416   CL 104 03/24/2016 0416   CO2 25 03/24/2016 0416   GLUCOSE 227 (H) 03/24/2016 0416   BUN 24 (H) 03/24/2016 0416   CREATININE 0.99 03/24/2016 0416   CALCIUM 8.1 (L) 03/24/2016 0416   PROT 6.7 03/24/2016 0416   ALBUMIN 3.7 03/24/2016 0416   AST 41 03/24/2016 0416   ALT 36 03/24/2016 0416   ALKPHOS 42 03/24/2016 0416   BILITOT 2.1 (H) 03/24/2016 0416   GFRNONAA >60 03/24/2016 0416   GFRAA >60 03/24/2016 0416   Lipase     Component Value Date/Time   LIPASE 165 (H) 03/24/2016 0416       Studies/Results: Ct Abdomen Pelvis W Contrast  Result Date: 03/23/2016 CLINICAL DATA:  Mid  abdominal pain and vomiting since yesterday. History of pulmonary fungal infection. EXAM: CT ABDOMEN AND PELVIS WITH CONTRAST TECHNIQUE: Multidetector CT imaging of the abdomen and pelvis was performed using the standard protocol following bolus administration of intravenous contrast. CONTRAST:  15mL ISOVUE-300 IOPAMIDOL (ISOVUE-300) INJECTION 61%, 100mL ISOVUE-300 IOPAMIDOL (ISOVUE-300) INJECTION 61% COMPARISON:  Head CT 03/05/2010. FINDINGS: Lower chest: Bilateral pleural and parenchymal scarring right more than left. Aortic atherosclerosis and coronary artery atherosclerosis. Elevated right hemidiaphragm. Hepatobiliary: Diffuse fatty change of the liver. Small stones dependent in the gallbladder. No sign of gallbladder inflammation or biliary ductal dilatation. Pancreas: Diffuse fatty change. Surrounding retroperitoneal edema probably indicating early pancreatitis. Spleen: Normal Adrenals/Urinary Tract: Adrenal glands normal. Kidneys normal except for a few tiny cysts. Stomach/Bowel: No acute finding. Diverticulosis without imaging evidence of diverticulitis. Vascular/Lymphatic: Aortic atherosclerosis.  No aneurysm. Reproductive: Enlarged prostate. Other: Small amount of free fluid in the pelvic peritoneal space. Musculoskeletal: Chronic degenerative changes affect the spine. IMPRESSION: Fatty liver. Small gallstones in the gallbladder. Acute pancreatitis with retroperitoneal edema but no pseudocyst or abscess. Constellation of findings suggests gallstone pancreatitis. The differential diagnosis does include duodenitis/peptic ulcer disease, but that is felt less likely. Advanced aortic and coronary artery calcification. Diverticulosis without evidence of diverticulitis. Enlarged prostate. Electronically Signed   By: Loraine LericheMark  Shogry M.D.   On: 03/23/2016 09:53   US Abdomen Limited Ruq  Result Date: 03/23/2016 CLINICAL DATA:  Suspected gallstone pancreatitis. EXAM: US ABDOMEN LIMITED - RIGHT UPPER QUADRANT  COMPARISON:  CT abdomen pelvis earlier today. FINDINGS: Gallbladder: Multiple gallstones, largest of which is 7 mm. Mild increased gallbladder wall thickness up to 2.6 mm. No pericholecystic fluid. Negative sonographic Murphy's sign. Common bile duct: Diameter: Upper limits normal, 6.5 mm. Liver: No focal lesion identified.  Increased echogenicity. IMPRESSION: Cholelithiasis. Mild gallbladder wall thickening but no tenderness to sonographic interrogation or pericholecystic fluid. Common bile duct upper limits normal, 6.5 mm. Hepatic steatosis. Electronically Signed   By: Elsie Stain M.D.   On: 03/23/2016 12:22    Anti-infectives: Anti-infectives    Start     Dose/Rate Route Frequency Ordered Stop   03/23/16 1115  piperacillin-tazobactam (ZOSYN) IVPB 3.375 g     3.375 g 12.5 mL/hr over 240 Minutes Intravenous  Once 03/23/16 1112 03/23/16 1255       Assessment/Plan Gallstone pancreatitis - CT - small gallstones in the gallbladder, acute pancreatitis with retroperitoneal edema but no pseudocyst or abscess - U/s - Cholelithiasis. Mild gallbladder wall thickening but no tenderness to sonographic interrogation or pericholecystic fluid. Common bile duct upper limits normal, 6.5 mm. - lipase down today 165 from 648 - bilirubin increased today 2.1 from 1.6 - pain worse today Leukocytosis - WBC increased today 37.2 from 24 - lactic acid 1.52, afebrile, BPs stable, HR 99 - procalcitonin pending  Hyperglycemia HTN H/o pulmonary lobectomy 2011  ID - zosyn day 2 VTE - lovenox FEN - NPO  Plan - increase morphine. MRCP, depending on results may consult GI. Likely will not have surgery today.   LOS: 1 day    Edson Snowball , [facility] 03/24/2016, 8:23 AM Pager: [phone] Consults: [phone] Mon-Fri 7:00 am-4:30 pm Sat-Sun 7:00 am-11:30 am

## 2016-03-24 NOTE — Progress Notes (Signed)
Pt O2 sats 91% on RA. Applied 2 liters O2, sats increased to 96%. Will continue to monitor..Marland Kitchen

## 2016-03-25 LAB — COMPREHENSIVE METABOLIC PANEL
ALT: 36 U/L (ref 17–63)
AST: 59 U/L — AB (ref 15–41)
Albumin: 3 g/dL — ABNORMAL LOW (ref 3.5–5.0)
Alkaline Phosphatase: 40 U/L (ref 38–126)
Anion gap: 7 (ref 5–15)
BUN: 21 mg/dL — ABNORMAL HIGH (ref 6–20)
CHLORIDE: 106 mmol/L (ref 101–111)
CO2: 23 mmol/L (ref 22–32)
Calcium: 8 mg/dL — ABNORMAL LOW (ref 8.9–10.3)
Creatinine, Ser: 0.66 mg/dL (ref 0.61–1.24)
Glucose, Bld: 139 mg/dL — ABNORMAL HIGH (ref 65–99)
POTASSIUM: 4 mmol/L (ref 3.5–5.1)
SODIUM: 136 mmol/L (ref 135–145)
Total Bilirubin: 3.6 mg/dL — ABNORMAL HIGH (ref 0.3–1.2)
Total Protein: 5.4 g/dL — ABNORMAL LOW (ref 6.5–8.1)

## 2016-03-25 LAB — CBC
HCT: 44.8 % (ref 39.0–52.0)
Hemoglobin: 15.2 g/dL (ref 13.0–17.0)
MCH: 32.9 pg (ref 26.0–34.0)
MCHC: 33.9 g/dL (ref 30.0–36.0)
MCV: 97 fL (ref 78.0–100.0)
PLATELETS: 166 10*3/uL (ref 150–400)
RBC: 4.62 MIL/uL (ref 4.22–5.81)
RDW: 13.4 % (ref 11.5–15.5)
WBC: 25.6 10*3/uL — AB (ref 4.0–10.5)

## 2016-03-25 LAB — LIPASE, BLOOD: LIPASE: 45 U/L (ref 11–51)

## 2016-03-25 LAB — LACTIC ACID, PLASMA: LACTIC ACID, VENOUS: 2.1 mmol/L — AB (ref 0.5–1.9)

## 2016-03-25 LAB — PSA: PSA: 4.66 ng/mL — ABNORMAL HIGH (ref 0.00–4.00)

## 2016-03-25 MED ORDER — SODIUM CHLORIDE 0.9 % IV BOLUS (SEPSIS)
500.0000 mL | Freq: Once | INTRAVENOUS | Status: AC
  Administered 2016-03-25: 500 mL via INTRAVENOUS

## 2016-03-25 MED ORDER — LIP MEDEX EX OINT
TOPICAL_OINTMENT | CUTANEOUS | Status: AC
  Administered 2016-03-25: 12:00:00
  Filled 2016-03-25: qty 7

## 2016-03-25 MED ORDER — MORPHINE SULFATE (PF) 2 MG/ML IV SOLN
2.0000 mg | INTRAVENOUS | Status: DC | PRN
  Administered 2016-03-25 (×2): 2 mg via INTRAVENOUS
  Administered 2016-03-25: 4 mg via INTRAVENOUS
  Administered 2016-03-26 – 2016-03-27 (×7): 2 mg via INTRAVENOUS
  Administered 2016-03-27: 4 mg via INTRAVENOUS
  Administered 2016-03-28 (×3): 2 mg via INTRAVENOUS
  Administered 2016-03-28: 4 mg via INTRAVENOUS
  Administered 2016-03-28: 2 mg via INTRAVENOUS
  Administered 2016-03-29 (×3): 4 mg via INTRAVENOUS
  Administered 2016-03-29: 2 mg via INTRAVENOUS
  Administered 2016-03-29 – 2016-03-30 (×9): 4 mg via INTRAVENOUS
  Administered 2016-03-31: 2 mg via INTRAVENOUS
  Administered 2016-03-31 (×3): 4 mg via INTRAVENOUS
  Administered 2016-03-31: 2 mg via INTRAVENOUS
  Administered 2016-03-31 – 2016-04-01 (×2): 4 mg via INTRAVENOUS
  Administered 2016-04-01: 2 mg via INTRAVENOUS
  Administered 2016-04-01: 4 mg via INTRAVENOUS
  Administered 2016-04-01: 2 mg via INTRAVENOUS
  Administered 2016-04-01 – 2016-04-02 (×4): 4 mg via INTRAVENOUS
  Administered 2016-04-02: 2 mg via INTRAVENOUS
  Administered 2016-04-02: 4 mg via INTRAVENOUS
  Administered 2016-04-03 (×4): 2 mg via INTRAVENOUS
  Administered 2016-04-04 – 2016-04-05 (×5): 4 mg via INTRAVENOUS
  Administered 2016-04-05: 2 mg via INTRAVENOUS
  Administered 2016-04-05: 4 mg via INTRAVENOUS
  Administered 2016-04-06 – 2016-04-07 (×3): 2 mg via INTRAVENOUS
  Filled 2016-03-25 (×4): qty 2
  Filled 2016-03-25 (×5): qty 1
  Filled 2016-03-25 (×3): qty 2
  Filled 2016-03-25: qty 1
  Filled 2016-03-25: qty 2
  Filled 2016-03-25 (×3): qty 1
  Filled 2016-03-25: qty 2
  Filled 2016-03-25: qty 1
  Filled 2016-03-25 (×4): qty 2
  Filled 2016-03-25 (×2): qty 1
  Filled 2016-03-25 (×3): qty 2
  Filled 2016-03-25 (×3): qty 1
  Filled 2016-03-25: qty 2
  Filled 2016-03-25: qty 1
  Filled 2016-03-25 (×2): qty 2
  Filled 2016-03-25 (×2): qty 1
  Filled 2016-03-25 (×4): qty 2
  Filled 2016-03-25: qty 1
  Filled 2016-03-25: qty 2
  Filled 2016-03-25: qty 1
  Filled 2016-03-25 (×6): qty 2
  Filled 2016-03-25 (×2): qty 1
  Filled 2016-03-25 (×2): qty 2
  Filled 2016-03-25 (×4): qty 1
  Filled 2016-03-25: qty 2
  Filled 2016-03-25: qty 1

## 2016-03-25 MED ORDER — ACETAMINOPHEN 325 MG PO TABS
650.0000 mg | ORAL_TABLET | ORAL | Status: DC | PRN
  Administered 2016-03-25 – 2016-04-02 (×6): 650 mg via ORAL
  Filled 2016-03-25 (×7): qty 2

## 2016-03-25 NOTE — Progress Notes (Signed)
PROGRESS NOTE  Colin West GNF:[id number] DOB: January 17, 1934 DOA: 03/23/2016 PCP: [name], MD  HPI/Recap of past 24 hours:  In pain, no fever, no vomiting, family in room  Assessment/Plan: Active Problems:   Essential hypertension   Abdominal pain   Acute pancreatitis  1. Acute pancreatitis-, lipase 648 on admission, repeat on 10/9 is 165, likely gallstone pancreatitis, CBD measures upper limits of normal 6.5 mm . keep nothing by mouth, continue IVF. Patient state pain is not well controlled, adjust prn pain meds,MRCP no cbd stone. Gen. surgery is following 2. Leukocytosis- likely from dehydration and stress/pancreatitis, no fever, bp stable, empirically abx with iv cipro/flagyl,  lactic acid 1.52. procalcitonin is  0.17, doubt infection. Will continue cycle lactic acid and procacitonin. mrcp with progression of pancreatitis, no cbd stone 3. Hypertension/sinus tachcyardia- hold lisinopril at this time, start lopressor iv, continue  hydralazine 10 mg IV prn . 4.  Hyperglycemia- blood glucose is elevated to 202, hemoglobin A1c 5.9   DVT Prophylaxis-   Lovenox , hold lovenox if surgery planned   Family Communication: patient and his family daily  Code Status:  per surgery note DNR is not correct, admitting MD talked to patient on admission on 10/8 , patient states he is DNR.  I talked to the patient on 10/9, patient states he has a living will states he is DNR. DNR ordered.  Disposition Plan: remain in the hospital   Consultants:  General surgery  Procedures:  none  Antibiotics:  none   Objective: BP (!) 166/83 (BP Location: Left Arm)   Pulse 95   Temp 97.9 F (36.6 C) (Axillary)   Resp 16   Ht 5\' 10"  (1.778 m)   Wt 77.1 kg (170 lb)   SpO2 95%   BMI 24.39 kg/m   Intake/Output Summary (Last 24 hours) at 03/25/16 1017 Last data filed at 03/25/16 0930  Gross per 24 hour  Intake           3112.5 ml  Output              950 ml  Net            2162.5 ml   Filed Weights   03/23/16 0759  Weight: 77.1 kg (170 lb)    Exam:   General:  in pain  Cardiovascular: RRR  Respiratory: CTABL  Abdomen: +tender right upper quadrant,  positive BS  Musculoskeletal: No Edema  Neuro: aaox3  Data Reviewed: Basic Metabolic Panel:  Recent Labs Lab 03/23/16 0819 03/24/16 0416 03/25/16 0455  NA 137 136 136  K 3.5 4.7 4.0  CL 103 104 106  CO2 23 25 23   GLUCOSE 202* 227* 139*  BUN 28* 24* 21*  CREATININE 1.02 0.99 0.66  CALCIUM 8.9 8.1* 8.0*   Liver Function Tests:  Recent Labs Lab 03/23/16 0819 03/24/16 0416 03/25/16 0455  AST 36 41 59*  ALT 44 36 36  ALKPHOS 45 42 40  BILITOT 1.6* 2.1* 3.6*  PROT 7.2 6.7 5.4*  ALBUMIN 4.1 3.7 3.0*    Recent Labs Lab 03/23/16 0819 03/24/16 0416 03/25/16 0455  LIPASE 648* 165* 45   No results for input(s): AMMONIA in the last 168 hours. CBC:  Recent Labs Lab 03/23/16 0819 03/24/16 0416 03/25/16 0455  WBC 24.0* 37.2* 25.6*  NEUTROABS 22.1*  --   --   HGB 17.0 16.5 15.2  HCT 47.6 48.5 44.8  MCV 94.8 97.6 97.0  PLT 220 204 166   Cardiac Enzymes:  No results for input(s): CKTOTAL, CKMB, CKMBINDEX, TROPONINI in the last 168 hours. BNP (last 3 results) No results for input(s): BNP in the last 8760 hours.  ProBNP (last 3 results) No results for input(s): PROBNP in the last 8760 hours.  CBG: No results for input(s): GLUCAP in the last 168 hours.  No results found for this or any previous visit (from the past 240 hour(s)).   Studies: Colin 3d Recon At Scanner  Result Date: 03/24/2016 CLINICAL DATA:  Abdominal pain and vomiting. Acute pancreatitis on CT. Gallstones. EXAM: MRI ABDOMEN WITHOUT AND WITH CONTRAST (INCLUDING MRCP) TECHNIQUE: Multiplanar multisequence Colin imaging of the abdomen was performed both before and after the administration of intravenous contrast. Heavily T2-weighted images of the biliary and pancreatic ducts were obtained, and three-dimensional MRCP  images were rendered by post processing. CONTRAST:  17mL MULTIHANCE GADOBENATE DIMEGLUMINE 529 MG/ML IV SOLN COMPARISON:  Ultrasound and CT of 1 day prior. FINDINGS: Portions of exam are mild to moderately motion degraded. Lower chest: Development of small bilateral pleural effusions. Normal heart size. No pericardial effusion. Hepatobiliary: Marked hepatic steatosis. No focal liver lesion. Multiple gallstones. No specific evidence of acute cholecystitis. No intrahepatic duct dilatation. The common duct is normal, including at 4 mm on image 2/series 9. Compressed in the region of the pancreatic head, likely due to edema. No choledocholithiasis. Pancreas: Progression of moderate pancreatic and peripancreatic edema. The pancreas enhances normally. Small volume peripancreatic fluid, including within the lesser sac. No well-defined fluid collection. No pancreatic duct dilatation. Spleen:  Normal in size, without focal abnormality. Adrenals/Urinary Tract: Left adrenal thickening. Maintenance of adreniform shape. Normal right adrenal gland. Bilateral small renal cysts. No hydronephrosis. Stomach/Bowel: Normal stomach. The descending and transverse duodenum appear thick walled, specially on image 77/ series 40102. Likely secondary to pancreatitis. Large and small bowel loops are otherwise unremarkable. Vascular/Lymphatic: Aortic and branch vessel atherosclerosis. Patent portal and splenic veins. No retroperitoneal or retrocrural adenopathy. Other:  Development of small volume abdominal ascites. Musculoskeletal: Convex right lumbar spine curvature. IMPRESSION: 1. Progression of moderate pancreatitis.  No acute complication. 2. Cholelithiasis. 3. No biliary duct dilatation or evidence of choledocholithiasis. 4. Development of small volume abdominal ascites and small bilateral pleural effusions. Electronically Signed   By: Jeronimo Greaves M.D.   On: 03/24/2016 21:44   Colin West W/wo Cm/mrcp  Result Date: 03/24/2016 CLINICAL  DATA:  Abdominal pain and vomiting. Acute pancreatitis on CT. Gallstones. EXAM: MRI ABDOMEN WITHOUT AND WITH CONTRAST (INCLUDING MRCP) TECHNIQUE: Multiplanar multisequence Colin imaging of the abdomen was performed both before and after the administration of intravenous contrast. Heavily T2-weighted images of the biliary and pancreatic ducts were obtained, and three-dimensional MRCP images were rendered by post processing. CONTRAST:  17mL MULTIHANCE GADOBENATE DIMEGLUMINE 529 MG/ML IV SOLN COMPARISON:  Ultrasound and CT of 1 day prior. FINDINGS: Portions of exam are mild to moderately motion degraded. Lower chest: Development of small bilateral pleural effusions. Normal heart size. No pericardial effusion. Hepatobiliary: Marked hepatic steatosis. No focal liver lesion. Multiple gallstones. No specific evidence of acute cholecystitis. No intrahepatic duct dilatation. The common duct is normal, including at 4 mm on image 2/series 9. Compressed in the region of the pancreatic head, likely due to edema. No choledocholithiasis. Pancreas: Progression of moderate pancreatic and peripancreatic edema. The pancreas enhances normally. Small volume peripancreatic fluid, including within the lesser sac. No well-defined fluid collection. No pancreatic duct dilatation. Spleen:  Normal in size, without focal abnormality. Adrenals/Urinary Tract: Left adrenal thickening. Maintenance of adreniform  shape. Normal right adrenal gland. Bilateral small renal cysts. No hydronephrosis. Stomach/Bowel: Normal stomach. The descending and transverse duodenum appear thick walled, specially on image 77/ series [id number]. Likely secondary to pancreatitis. Large and small bowel loops are otherwise unremarkable. Vascular/Lymphatic: Aortic and branch vessel atherosclerosis. Patent portal and splenic veins. No retroperitoneal or retrocrural adenopathy. Other:  Development of small volume abdominal ascites. Musculoskeletal: Convex right lumbar spine curvature.  IMPRESSION: 1. Progression of moderate pancreatitis.  No acute complication. 2. Cholelithiasis. 3. No biliary duct dilatation or evidence of choledocholithiasis. 4. Development of small volume abdominal ascites and small bilateral pleural effusions. Electronically Signed   By: Jeronimo GreavesKyle  Talbot M.D.   On: 03/24/2016 21:44    Scheduled Meds: . chlorhexidine  15 mL Mouth Rinse BID  . ciprofloxacin  400 mg Intravenous Q12H  . enoxaparin (LOVENOX) injection  40 mg Subcutaneous Daily  . famotidine (PEPCID) IV  20 mg Intravenous Q12H  . mouth rinse  15 mL Mouth Rinse q12n4p  . metoprolol  5 mg Intravenous Q6H  . metronidazole  500 mg Intravenous Q8H    Continuous Infusions: . sodium chloride 150 mL/hr (03/25/16 0800)     Time spent: 25mins  Jasmaine Rochel MD, PhD  [facility] Pager [phone]. If 7PM-7AM, please contact night-coverage at [email or web address], password [email or web address] 03/25/2016, 10:17 AM  LOS: 2 days

## 2016-03-25 NOTE — Progress Notes (Signed)
CRITICAL VALUE ALERT  Critical value received: Lactic Acid: 2.1  Date of notification:  03/25/2016  Time of notification:  0548  Critical value read back:Yes.    Nurse who received alert:  Steele BergJ. Isham Smitherman, RN  MD notified (1st page): Donnamarie PoagK. Kirby, NP  Time of first page:  [phone]  MD notified (2nd page): Donnamarie PoagK. Kirby, NP  Time of second page: 0615  Responding MD:  Donnamarie PoagK. Kirby, NP  Time MD responded:  [phone]

## 2016-03-25 NOTE — Progress Notes (Signed)
Patient ID: Colin West, male   DOB: 12-Apr-1934, 82 y.o.   MRN: [id number]  [facility] Surgery Progress Note     Subjective: Patient is tired and still having intermittent abdominal pain; had a hard time staying awake during our visit. Brother in room.  Objective: Vital signs in last 24 hours: Temp:  [97.9 F (36.6 C)-98.5 F (36.9 C)] 97.9 F (36.6 C) (10/10 0517) Pulse Rate:  [95-155] 95 (10/10 0517) Resp:  [16-18] 16 (10/10 0517) BP: (130-170)/(77-104) 166/83 (10/10 0517) SpO2:  [94 %-98 %] 95 % (10/10 0517) Last BM Date: 03/22/16  Intake/Output from previous day: 10/09 0701 - 10/10 0700 In: 3112.5 [I.V.:2412.5; IV Piggyback:700] Out: 1500 [Urine:1500] Intake/Output this shift: No intake/output data recorded.  PE: Gen:  drowsy, pleasant, uncomfortable Abd: Soft, mild TTP RUQ/epigastric region  Lab Results:   Recent Labs  03/24/16 0416 03/25/16 0455  WBC 37.2* 25.6*  HGB 16.5 15.2  HCT 48.5 44.8  PLT 204 166   BMET  Recent Labs  03/24/16 0416 03/25/16 0455  NA 136 136  K 4.7 4.0  CL 104 106  CO2 25 23  GLUCOSE 227* 139*  BUN 24* 21*  CREATININE 0.99 0.66  CALCIUM 8.1* 8.0*   PT/INR No results for input(s): LABPROT, INR in the last 72 hours. CMP     Component Value Date/Time   NA 136 03/25/2016 0455   K 4.0 03/25/2016 0455   CL 106 03/25/2016 0455   CO2 23 03/25/2016 0455   GLUCOSE 139 (H) 03/25/2016 0455   BUN 21 (H) 03/25/2016 0455   CREATININE 0.66 03/25/2016 0455   CALCIUM 8.0 (L) 03/25/2016 0455   PROT 5.4 (L) 03/25/2016 0455   ALBUMIN 3.0 (L) 03/25/2016 0455   AST 59 (H) 03/25/2016 0455   ALT 36 03/25/2016 0455   ALKPHOS 40 03/25/2016 0455   BILITOT 3.6 (H) 03/25/2016 0455   GFRNONAA >60 03/25/2016 0455   GFRAA >60 03/25/2016 0455   Lipase     Component Value Date/Time   LIPASE 45 03/25/2016 0455       Studies/Results: Mr 3d Recon At Scanner  Result Date: 03/24/2016 CLINICAL DATA:  Abdominal pain and vomiting.  Acute pancreatitis on CT. Gallstones. EXAM: MRI ABDOMEN WITHOUT AND WITH CONTRAST (INCLUDING MRCP) TECHNIQUE: Multiplanar multisequence MR imaging of the abdomen was performed both before and after the administration of intravenous contrast. Heavily T2-weighted images of the biliary and pancreatic ducts were obtained, and three-dimensional MRCP images were rendered by post processing. CONTRAST:  17mL MULTIHANCE GADOBENATE DIMEGLUMINE 529 MG/ML IV SOLN COMPARISON:  Ultrasound and CT of 1 day prior. FINDINGS: Portions of exam are mild to moderately motion degraded. Lower chest: Development of small bilateral pleural effusions. Normal heart size. No pericardial effusion. Hepatobiliary: Marked hepatic steatosis. No focal liver lesion. Multiple gallstones. No specific evidence of acute cholecystitis. No intrahepatic duct dilatation. The common duct is normal, including at 4 mm on image 2/series 9. Compressed in the region of the pancreatic head, likely due to edema. No choledocholithiasis. Pancreas: Progression of moderate pancreatic and peripancreatic edema. The pancreas enhances normally. Small volume peripancreatic fluid, including within the lesser sac. No well-defined fluid collection. No pancreatic duct dilatation. Spleen:  Normal in size, without focal abnormality. Adrenals/Urinary Tract: Left adrenal thickening. Maintenance of adreniform shape. Normal right adrenal gland. Bilateral small renal cysts. No hydronephrosis. Stomach/Bowel: Normal stomach. The descending and transverse duodenum appear thick walled, specially on image 77/ series 78295. Likely secondary to pancreatitis. Large and small bowel  loops are otherwise unremarkable. Vascular/Lymphatic: Aortic and branch vessel atherosclerosis. Patent portal and splenic veins. No retroperitoneal or retrocrural adenopathy. Other:  Development of small volume abdominal ascites. Musculoskeletal: Convex right lumbar spine curvature. IMPRESSION: 1. Progression of  moderate pancreatitis.  No acute complication. 2. Cholelithiasis. 3. No biliary duct dilatation or evidence of choledocholithiasis. 4. Development of small volume abdominal ascites and small bilateral pleural effusions. Electronically Signed   By: Jeronimo Greaves M.D.   On: 03/24/2016 21:44   [name] W/wo Cm/mrcp  Result Date: 03/24/2016 CLINICAL DATA:  Abdominal pain and vomiting. Acute pancreatitis on CT. Gallstones. EXAM: MRI ABDOMEN WITHOUT AND WITH CONTRAST (INCLUDING MRCP) TECHNIQUE: Multiplanar multisequence MR imaging of the abdomen was performed both before and after the administration of intravenous contrast. Heavily T2-weighted images of the biliary and pancreatic ducts were obtained, and three-dimensional MRCP images were rendered by post processing. CONTRAST:  17mL MULTIHANCE GADOBENATE DIMEGLUMINE 529 MG/ML IV SOLN COMPARISON:  Ultrasound and CT of 1 day prior. FINDINGS: Portions of exam are mild to moderately motion degraded. Lower chest: Development of small bilateral pleural effusions. Normal heart size. No pericardial effusion. Hepatobiliary: Marked hepatic steatosis. No focal liver lesion. Multiple gallstones. No specific evidence of acute cholecystitis. No intrahepatic duct dilatation. The common duct is normal, including at 4 mm on image 2/series 9. Compressed in the region of the pancreatic head, likely due to edema. No choledocholithiasis. Pancreas: Progression of moderate pancreatic and peripancreatic edema. The pancreas enhances normally. Small volume peripancreatic fluid, including within the lesser sac. No well-defined fluid collection. No pancreatic duct dilatation. Spleen:  Normal in size, without focal abnormality. Adrenals/Urinary Tract: Left adrenal thickening. Maintenance of adreniform shape. Normal right adrenal gland. Bilateral small renal cysts. No hydronephrosis. Stomach/Bowel: Normal stomach. The descending and transverse duodenum appear thick walled, specially on image 77/  series 16109. Likely secondary to pancreatitis. Large and small bowel loops are otherwise unremarkable. Vascular/Lymphatic: Aortic and branch vessel atherosclerosis. Patent portal and splenic veins. No retroperitoneal or retrocrural adenopathy. Other:  Development of small volume abdominal ascites. Musculoskeletal: Convex right lumbar spine curvature. IMPRESSION: 1. Progression of moderate pancreatitis.  No acute complication. 2. Cholelithiasis. 3. No biliary duct dilatation or evidence of choledocholithiasis. 4. Development of small volume abdominal ascites and small bilateral pleural effusions. Electronically Signed   By: Jeronimo Greaves M.D.   On: 03/24/2016 21:44   US Abdomen Limited Ruq  Result Date: 03/23/2016 CLINICAL DATA:  Suspected gallstone pancreatitis. EXAM: US ABDOMEN LIMITED - RIGHT UPPER QUADRANT COMPARISON:  CT abdomen pelvis earlier today. FINDINGS: Gallbladder: Multiple gallstones, largest of which is 7 mm. Mild increased gallbladder wall thickness up to 2.6 mm. No pericholecystic fluid. Negative sonographic Murphy's sign. Common bile duct: Diameter: Upper limits normal, 6.5 mm. Liver: No focal lesion identified.  Increased echogenicity. IMPRESSION: Cholelithiasis. Mild gallbladder wall thickening but no tenderness to sonographic interrogation or pericholecystic fluid. Common bile duct upper limits normal, 6.5 mm. Hepatic steatosis. Electronically Signed   By: Elsie Stain M.D.   On: 03/23/2016 12:22    Anti-infectives: Anti-infectives    Start     Dose/Rate Route Frequency Ordered Stop   03/24/16 1400  metroNIDAZOLE (FLAGYL) IVPB 500 mg     500 mg 100 mL/hr over 60 Minutes Intravenous Every 8 hours 03/24/16 1213     03/24/16 1300  ciprofloxacin (CIPRO) IVPB 400 mg     400 mg 200 mL/hr over 60 Minutes Intravenous Every 12 hours 03/24/16 1213     03/23/16 1115  piperacillin-tazobactam (  ZOSYN) IVPB 3.375 g     3.375 g 12.5 mL/hr over 240 Minutes Intravenous  Once 03/23/16 1112  03/23/16 1255       Assessment/Plan Gallstone pancreatitis - CT - small gallstones in the gallbladder, acute pancreatitis with retroperitoneal edema but no pseudocyst or abscess - U/s - Cholelithiasis. Mild gallbladder wall thickening but no tenderness to sonographic interrogation or pericholecystic fluid. Common bile duct upper limits normal, 6.5 mm. - bilirubin increased today 3.6 from 2.1 - MRCP showed Cholelithiasis but no choledocholithiasis.  - lipase down today 45 from 165 - continued abdominal pain although less tender on exam today Leukocytosis - WBC trending down 25.6 today from 37.2 - lactic acid 2.1, afebrile, intermittently hypertensive and tachycardic  Hyperglycemia HTN H/o pulmonary lobectomy 2011  ID - zosyn day 3 VTE - lovenox FEN - NPO  Plan - MRCP reported cholelithiasis but no choledocholithiasis. Continues to have significant abdominal pain but seems to be improving on exam. WBC trending down. OR maybe tomorrow.   LOS: 2 days    Edson SnowballBROOKE A MILLER , [facility] 03/25/2016, 9:45 AM Pager: [phone] Consults: [phone] Mon-Fri 7:00 am-4:30 pm Sat-Sun 7:00 am-11:30 am

## 2016-03-26 ENCOUNTER — Inpatient Hospital Stay (HOSPITAL_COMMUNITY): Payer: Medicare Other

## 2016-03-26 DIAGNOSIS — R1013 Epigastric pain: Secondary | ICD-10-CM

## 2016-03-26 LAB — COMPREHENSIVE METABOLIC PANEL
ALBUMIN: 3.1 g/dL — AB (ref 3.5–5.0)
ALK PHOS: 37 U/L — AB (ref 38–126)
ALT: 49 U/L (ref 17–63)
AST: 63 U/L — AB (ref 15–41)
Anion gap: 6 (ref 5–15)
BUN: 21 mg/dL — AB (ref 6–20)
CALCIUM: 7.9 mg/dL — AB (ref 8.9–10.3)
CO2: 23 mmol/L (ref 22–32)
CREATININE: 0.48 mg/dL — AB (ref 0.61–1.24)
Chloride: 108 mmol/L (ref 101–111)
GFR calc non Af Amer: >60 mL/min (ref >60)
GLUCOSE: 101 mg/dL — AB (ref 65–99)
Potassium: 3.5 mmol/L (ref 3.5–5.1)
SODIUM: 137 mmol/L (ref 135–145)
Total Bilirubin: 7.3 mg/dL — ABNORMAL HIGH (ref 0.3–1.2)
Total Protein: 5.8 g/dL — ABNORMAL LOW (ref 6.5–8.1)

## 2016-03-26 LAB — CBC
HCT: 42 % (ref 39.0–52.0)
HEMOGLOBIN: 14.3 g/dL (ref 13.0–17.0)
MCH: 32.4 pg (ref 26.0–34.0)
MCHC: 34 g/dL (ref 30.0–36.0)
MCV: 95.2 fL (ref 78.0–100.0)
Platelets: 174 10*3/uL (ref 150–400)
RBC: 4.41 MIL/uL (ref 4.22–5.81)
RDW: 13.4 % (ref 11.5–15.5)
WBC: 18.4 10*3/uL — ABNORMAL HIGH (ref 4.0–10.5)

## 2016-03-26 LAB — PROCALCITONIN: Procalcitonin: 0.55 ng/mL

## 2016-03-26 MED ORDER — IOPAMIDOL (ISOVUE-300) INJECTION 61%
100.0000 mL | Freq: Once | INTRAVENOUS | Status: AC | PRN
  Administered 2016-03-26: 100 mL via INTRAVENOUS

## 2016-03-26 MED ORDER — OXYCODONE HCL 5 MG/5ML PO SOLN
5.0000 mg | ORAL | Status: DC | PRN
  Administered 2016-03-26 – 2016-03-27 (×4): 5 mg via ORAL
  Filled 2016-03-26 (×4): qty 5

## 2016-03-26 MED ORDER — IOPAMIDOL (ISOVUE-300) INJECTION 61%: 30.0000 mL | Freq: Once | INTRAVENOUS | Status: AC | PRN

## 2016-03-26 NOTE — Consult Note (Signed)
[facility] Gastroenterology Consultation Note  Referring Provider: Dr. Pleas Koch [facility]) Primary Care Physician:  [name], MD  Reason for Consultation:  pancreatitis  HPI: Colin West is a 82 y.o. male whom we've been asked to see for pancreatitis.  Patient in static state of GI health until about one week ago, at which time he had sudden onset of periumbilical and lower abdominal pain with nausea and with radiation to back.  Imaging consistent with gallstone pancreatitis.  No significant improvement over the past few days, and his LFTs have worsened.  Eating ice chips.  Has ongoing abdominal pain and poor appetite.   Past Medical History:  Diagnosis Date  . Anxiety   . Carotid artery occlusion   . Depression    denies  . Diabetes mellitus    denies  . History of kidney stones   . Hyperlipidemia   . Hypertension     Past Surgical History:  Procedure Laterality Date  . ELBOW ARTHROSCOPY Right   . INGUINAL HERNIA REPAIR Left Sept 2006   Dr Corliss Skains  . INGUINAL HERNIA REPAIR Bilateral 06/01/2013   Procedure: LAPAROSCOPIC EXPLORATION BILATERAL INGUINAL HERNIA REPAIR;  Surgeon: Ardeth Sportsman, MD;  Location: [facility] OR;  Service: General;  Laterality: Bilateral;  Left Femoral, Left Recurrent inguinal, Right Inguinal  . INSERTION OF MESH Bilateral 06/01/2013   Procedure: INSERTION OF MESH;  Surgeon: Ardeth Sportsman, MD;  Location: [facility] OR;  Service: General;  Laterality: Bilateral;  Left Femoral, Left Recurrent inguinal, Right Inguinal  . LAPAROSCOPIC LYSIS OF ADHESIONS Bilateral 06/01/2013   Procedure: LAPAROSCOPIC LYSIS OF ADHESIONS;  Surgeon: Ardeth Sportsman, MD;  Location: [facility] OR;  Service: General;  Laterality: Bilateral;  . LUNG REMOVAL, PARTIAL  Sept. 2011    Dr. Edwyna Shell  . ROTATOR CUFF REPAIR     Bilateral repair    Prior to Admission medications   Medication Sig Start Date End Date Taking? Authorizing Provider  aspirin 81 MG tablet Take 81 mg by mouth daily.     Yes  Historical Provider, MD  lisinopril (PRINIVIL,ZESTRIL) 20 MG tablet Take 40 mg by mouth every morning.    Yes Historical Provider, MD  lovastatin (MEVACOR) 20 MG tablet Take 20 mg by mouth daily.   Yes Historical Provider, MD  metFORMIN (GLUCOPHAGE-XR) 500 MG 24 hr tablet Take 500 mg by mouth daily with breakfast.   Yes Historical Provider, MD  Multiple Vitamin (MULTIVITAMIN PO) Take 1 tablet by mouth every morning. Centrum Silver   Yes Historical Provider, MD  Cholecalciferol (VITAMIN D PO) Take by mouth.      Historical Provider, MD    Current Facility-Administered Medications  Medication Dose Route Frequency Provider Last Rate Last Dose  . 0.9 %  sodium chloride infusion   Intravenous Continuous Edson Snowball, PA-C 100 mL/hr at 03/26/16 0829    . acetaminophen (TYLENOL) tablet 650 mg  650 mg Oral Q4H PRN Edson Snowball, PA-C   650 mg at 03/26/16 0148  . chlorhexidine (PERIDEX) 0.12 % solution 15 mL  15 mL Mouth Rinse BID Meredeth Ide, MD   15 mL at 03/25/16 2127  . ciprofloxacin (CIPRO) IVPB 400 mg  400 mg Intravenous Q12H Albertine Grates, MD   400 mg at 03/26/16 0011  . enoxaparin (LOVENOX) injection 40 mg  40 mg Subcutaneous Daily Albertine Grates, MD   40 mg at 03/26/16 1100  . famotidine (PEPCID) IVPB 20 mg premix  20 mg Intravenous Q12H Albertine Grates, MD  20 mg at 03/26/16 1100  . hydrALAZINE (APRESOLINE) injection 10 mg  10 mg Intravenous Q4H PRN Meredeth IdeGagan S Lama, MD   10 mg at 03/25/16 2212  . HYDROmorphone (DILAUDID) injection 1 mg  1 mg Intravenous Q4H PRN Meredeth IdeGagan S Lama, MD   1 mg at 03/24/16 0502  . MEDLINE mouth rinse  15 mL Mouth Rinse q12n4p Meredeth IdeGagan S Lama, MD   15 mL at 03/25/16 1600  . metoprolol (LOPRESSOR) injection 5 mg  5 mg Intravenous Q6H Albertine GratesFang Xu, MD   5 mg at 03/26/16 1141  . metroNIDAZOLE (FLAGYL) IVPB 500 mg  500 mg Intravenous Q8H Albertine GratesFang Xu, MD   500 mg at 03/26/16 0518  . morphine 2 MG/ML injection 2-4 mg  2-4 mg Intravenous Q3H PRN Edson SnowballBrooke A Miller, PA-C   2 mg at 03/26/16 0813  . ondansetron  (ZOFRAN) injection 4 mg  4 mg Intravenous Q6H PRN Meredeth IdeGagan S Lama, MD      . oxyCODONE (ROXICODONE) 5 MG/5ML solution 5 mg  5 mg Oral Q3H PRN Berna Buehelsea A Connor, MD   5 mg at 03/26/16 1151    Allergies as of 03/23/2016  . (No Known Allergies)    Family History  Problem Relation Age of Onset  . Heart disease Brother     Heart Disease before age 80  . Heart attack Brother     Social History   Social History  . Marital status: Divorced    Spouse name: N/A  . Number of children: N/A  . Years of education: N/A   Occupational History  . Not on file.   Social History Main Topics  . Smoking status: Former Smoker    Quit date: 05/31/1973  . Smokeless tobacco: Never Used  . Alcohol use No  . Drug use: No  . Sexual activity: Not on file   Other Topics Concern  . Not on file   Social History Narrative  . No narrative on file    Review of Systems: ROS 03/23/16 Dr. Mauro KaufmannGagan Lama reviewed and I agree  Physical Exam: Vital signs in last 24 hours: Temp:  [97.5 F (36.4 C)-97.9 F (36.6 C)] 97.5 F (36.4 C) (10/11 1300) Pulse Rate:  [47-103] 95 (10/11 1300) Resp:  [14-18] 18 (10/11 1300) BP: (152-177)/(79-95) 172/95 (10/11 1300) SpO2:  [96 %-97 %] 97 % (10/11 1300) Last BM Date: 03/22/16 General:   Alert, elderly, somewhat deconditioned-appearing Head:  Normocephalic and atraumatic. Eyes:  Sclera clear, no icterus.   Conjunctiva pink. Ears:  Normal auditory acuity. Nose:  No deformity, discharge,  or lesions. Mouth:  No deformity or lesions.  Oropharynx dry Neck:  Supple; no masses or thyromegaly. Lungs:  Clear throughout to auscultation.   No wheezes, crackles, or rhonchi. No acute distress. Heart:  Regular rate and rhythm; no murmurs, clicks, rubs,  or gallops. Abdomen:  Protuberant, mild-to-moderate periumbilical tenderness, minimal bowel sounds, no peritonitis. No masses, hepatosplenomegaly or hernias noted. Normal bowel sounds, without guarding, and without rebound.      Msk:  Symmetrical without gross deformities. Normal posture. Pulses:  Normal pulses noted. Extremities:  Without clubbing or edema. Neurologic:  Alert and  oriented x4; diffusely weak, otherwise grossly normal neurologically. Skin:  Scattered ecchymoses, otherwise intact without significant lesions or rashes. Psych:  Alert and cooperative. Normal mood and affect.   Lab Results:  Recent Labs  03/24/16 0416 03/25/16 0455 03/26/16 0511  WBC 37.2* 25.6* 18.4*  HGB 16.5 15.2 14.3  HCT 48.5 44.8 42.0  PLT 204 166 174   BMET  Recent Labs  03/24/16 0416 03/25/16 0455 03/26/16 0511  NA 136 136 137  K 4.7 4.0 3.5  CL 104 106 108  CO2 25 23 23   GLUCOSE 227* 139* 101*  BUN 24* 21* 21*  CREATININE 0.99 0.66 0.48*  CALCIUM 8.1* 8.0* 7.9*   LFT  Recent Labs  03/26/16 0511  PROT 5.8*  ALBUMIN 3.1*  AST 63*  ALT 49  ALKPHOS 37*  BILITOT 7.3*   PT/INR No results for input(s): LABPROT, INR in the last 72 hours.  Studies/Results: Mr 3d Recon At Scanner  Result Date: 03/24/2016 CLINICAL DATA:  Abdominal pain and vomiting. Acute pancreatitis on CT. Gallstones. EXAM: MRI ABDOMEN WITHOUT AND WITH CONTRAST (INCLUDING MRCP) TECHNIQUE: Multiplanar multisequence MR imaging of the abdomen was performed both before and after the administration of intravenous contrast. Heavily T2-weighted images of the biliary and pancreatic ducts were obtained, and three-dimensional MRCP images were rendered by post processing. CONTRAST:  17mL MULTIHANCE GADOBENATE DIMEGLUMINE 529 MG/ML IV SOLN COMPARISON:  Ultrasound and CT of 1 day prior. FINDINGS: Portions of exam are mild to moderately motion degraded. Lower chest: Development of small bilateral pleural effusions. Normal heart size. No pericardial effusion. Hepatobiliary: Marked hepatic steatosis. No focal liver lesion. Multiple gallstones. No specific evidence of acute cholecystitis. No intrahepatic duct dilatation. The common duct is normal, including  at 4 mm on image 2/series 9. Compressed in the region of the pancreatic head, likely due to edema. No choledocholithiasis. Pancreas: Progression of moderate pancreatic and peripancreatic edema. The pancreas enhances normally. Small volume peripancreatic fluid, including within the lesser sac. No well-defined fluid collection. No pancreatic duct dilatation. Spleen:  Normal in size, without focal abnormality. Adrenals/Urinary Tract: Left adrenal thickening. Maintenance of adreniform shape. Normal right adrenal gland. Bilateral small renal cysts. No hydronephrosis. Stomach/Bowel: Normal stomach. The descending and transverse duodenum appear thick walled, specially on image 77/ series 16109. Likely secondary to pancreatitis. Large and small bowel loops are otherwise unremarkable. Vascular/Lymphatic: Aortic and branch vessel atherosclerosis. Patent portal and splenic veins. No retroperitoneal or retrocrural adenopathy. Other:  Development of small volume abdominal ascites. Musculoskeletal: Convex right lumbar spine curvature. IMPRESSION: 1. Progression of moderate pancreatitis.  No acute complication. 2. Cholelithiasis. 3. No biliary duct dilatation or evidence of choledocholithiasis. 4. Development of small volume abdominal ascites and small bilateral pleural effusions. Electronically Signed   By: Jeronimo Greaves M.D.   On: 03/24/2016 21:44   [name] W/wo Cm/mrcp  Result Date: 03/24/2016 CLINICAL DATA:  Abdominal pain and vomiting. Acute pancreatitis on CT. Gallstones. EXAM: MRI ABDOMEN WITHOUT AND WITH CONTRAST (INCLUDING MRCP) TECHNIQUE: Multiplanar multisequence MR imaging of the abdomen was performed both before and after the administration of intravenous contrast. Heavily T2-weighted images of the biliary and pancreatic ducts were obtained, and three-dimensional MRCP images were rendered by post processing. CONTRAST:  17mL MULTIHANCE GADOBENATE DIMEGLUMINE 529 MG/ML IV SOLN COMPARISON:  Ultrasound and CT of 1 day  prior. FINDINGS: Portions of exam are mild to moderately motion degraded. Lower chest: Development of small bilateral pleural effusions. Normal heart size. No pericardial effusion. Hepatobiliary: Marked hepatic steatosis. No focal liver lesion. Multiple gallstones. No specific evidence of acute cholecystitis. No intrahepatic duct dilatation. The common duct is normal, including at 4 mm on image 2/series 9. Compressed in the region of the pancreatic head, likely due to edema. No choledocholithiasis. Pancreas: Progression of moderate pancreatic and peripancreatic edema. The pancreas enhances normally. Small volume peripancreatic fluid, including  within the lesser sac. No well-defined fluid collection. No pancreatic duct dilatation. Spleen:  Normal in size, without focal abnormality. Adrenals/Urinary Tract: Left adrenal thickening. Maintenance of adreniform shape. Normal right adrenal gland. Bilateral small renal cysts. No hydronephrosis. Stomach/Bowel: Normal stomach. The descending and transverse duodenum appear thick walled, specially on image 77/ series 04540. Likely secondary to pancreatitis. Large and small bowel loops are otherwise unremarkable. Vascular/Lymphatic: Aortic and branch vessel atherosclerosis. Patent portal and splenic veins. No retroperitoneal or retrocrural adenopathy. Other:  Development of small volume abdominal ascites. Musculoskeletal: Convex right lumbar spine curvature. IMPRESSION: 1. Progression of moderate pancreatitis.  No acute complication. 2. Cholelithiasis. 3. No biliary duct dilatation or evidence of choledocholithiasis. 4. Development of small volume abdominal ascites and small bilateral pleural effusions. Electronically Signed   By: Jeronimo Greaves M.D.   On: 03/24/2016 21:44   Impression:  1.  Gallstone pancreatitis, at least moderate in severity. 2.  Abnormal imaging studies, pancreatic edema and peripancreatic fluid. 3.  Elevated liver tests.  Possible biliary compression  versus sequela of pancreatic inflammation versus CBD stone versus medication effect versus other.  Plan:  1.  Sips ice chips only for now. 2.  Repeat CT scan abdomen/pelvis, given slow clinical improvement,  to (A) exclude interval development of pancreatic fluid collections versus necrosis and (B) assess for biliary tract obstruction. 3.  I am extremely reluctant to pursue ERCP in setting of acute pancreatitis unless he has overwhelming biliary tract obstruction which is not responsive to medical therapy. 4.  Pending CT scan findings, one might need to reconsider timing of cholecystectomy.  At the present time, patient has ongoing abdominal tenderness and minimal bowel sounds and has at least moderate ascites on MRI study done just a couple days ago, all features of which might make one consider delaying cholecystectomy a bit. 5.  Pending CT scan results, we might need to consider nasojejunal feeding versus parenteral nutrition (TPN), since patient has not been able to eat for about one week. 6.  [facility] will follow.   LOS: 3 days   Khylon Davies M  03/26/2016, 2:28 PM  Pager [phone] If no answer or after 5 PM call [phone]

## 2016-03-26 NOTE — Progress Notes (Signed)
Patient ID: Colin West, male   DOB: 1933-07-25, 82 y.o.   MRN: [id number]  [facility] Surgery Progress Note     Subjective: Continues to complain of pain. Frustrated because he wants to have surgery sooner rather than later.  Objective: Vital signs in last 24 hours: Temp:  [97.7 F (36.5 C)-98.1 F (36.7 C)] 97.9 F (36.6 C) (10/11 0523) Pulse Rate:  [47-107] 101 (10/11 0523) Resp:  [14-16] 16 (10/11 0523) BP: (133-177)/(78-88) 177/87 (10/11 0523) SpO2:  [96 %-97 %] 97 % (10/11 0523) Last BM Date: 03/22/16  Intake/Output from previous day: 10/10 0701 - 10/11 0700 In: 4400 [I.V.:3600; IV Piggyback:800] Out: 1876 [Urine:1876] Intake/Output this shift: Total I/O In: -  Out: 200 [Urine:200]  PE: Gen: drowsy, pleasant, uncomfortable Abd: Soft, mild TTP RUQ/epigastric region, no guarding, +BS  Lab Results:   Recent Labs  03/25/16 0455 03/26/16 0511  WBC 25.6* 18.4*  HGB 15.2 14.3  HCT 44.8 42.0  PLT 166 174   BMET  Recent Labs  03/25/16 0455 03/26/16 0511  NA 136 137  K 4.0 3.5  CL 106 108  CO2 23 23  GLUCOSE 139* 101*  BUN 21* 21*  CREATININE 0.66 0.48*  CALCIUM 8.0* 7.9*   PT/INR No results for input(s): LABPROT, INR in the last 72 hours. CMP     Component Value Date/Time   NA 137 03/26/2016 0511   K 3.5 03/26/2016 0511   CL 108 03/26/2016 0511   CO2 23 03/26/2016 0511   GLUCOSE 101 (H) 03/26/2016 0511   BUN 21 (H) 03/26/2016 0511   CREATININE 0.48 (L) 03/26/2016 0511   CALCIUM 7.9 (L) 03/26/2016 0511   PROT 5.8 (L) 03/26/2016 0511   ALBUMIN 3.1 (L) 03/26/2016 0511   AST 63 (H) 03/26/2016 0511   ALT 49 03/26/2016 0511   ALKPHOS 37 (L) 03/26/2016 0511   BILITOT 7.3 (H) 03/26/2016 0511   GFRNONAA >60 03/26/2016 0511   GFRAA >60 03/26/2016 0511   Lipase     Component Value Date/Time   LIPASE 45 03/25/2016 0455       Studies/Results: Mr 3d Recon At Scanner  Result Date: 03/24/2016 CLINICAL DATA:  Abdominal pain and  vomiting. Acute pancreatitis on CT. Gallstones. EXAM: MRI ABDOMEN WITHOUT AND WITH CONTRAST (INCLUDING MRCP) TECHNIQUE: Multiplanar multisequence MR imaging of the abdomen was performed both before and after the administration of intravenous contrast. Heavily T2-weighted images of the biliary and pancreatic ducts were obtained, and three-dimensional MRCP images were rendered by post processing. CONTRAST:  17mL MULTIHANCE GADOBENATE DIMEGLUMINE 529 MG/ML IV SOLN COMPARISON:  Ultrasound and CT of 1 day prior. FINDINGS: Portions of exam are mild to moderately motion degraded. Lower chest: Development of small bilateral pleural effusions. Normal heart size. No pericardial effusion. Hepatobiliary: Marked hepatic steatosis. No focal liver lesion. Multiple gallstones. No specific evidence of acute cholecystitis. No intrahepatic duct dilatation. The common duct is normal, including at 4 mm on image 2/series 9. Compressed in the region of the pancreatic head, likely due to edema. No choledocholithiasis. Pancreas: Progression of moderate pancreatic and peripancreatic edema. The pancreas enhances normally. Small volume peripancreatic fluid, including within the lesser sac. No well-defined fluid collection. No pancreatic duct dilatation. Spleen:  Normal in size, without focal abnormality. Adrenals/Urinary Tract: Left adrenal thickening. Maintenance of adreniform shape. Normal right adrenal gland. Bilateral small renal cysts. No hydronephrosis. Stomach/Bowel: Normal stomach. The descending and transverse duodenum appear thick walled, specially on image 77/ series 19147. Likely secondary to pancreatitis. Large  and small bowel loops are otherwise unremarkable. Vascular/Lymphatic: Aortic and branch vessel atherosclerosis. Patent portal and splenic veins. No retroperitoneal or retrocrural adenopathy. Other:  Development of small volume abdominal ascites. Musculoskeletal: Convex right lumbar spine curvature. IMPRESSION: 1.  Progression of moderate pancreatitis.  No acute complication. 2. Cholelithiasis. 3. No biliary duct dilatation or evidence of choledocholithiasis. 4. Development of small volume abdominal ascites and small bilateral pleural effusions. Electronically Signed   By: Jeronimo Greaves M.D.   On: 03/24/2016 21:44   [name] W/wo Cm/mrcp  Result Date: 03/24/2016 CLINICAL DATA:  Abdominal pain and vomiting. Acute pancreatitis on CT. Gallstones. EXAM: MRI ABDOMEN WITHOUT AND WITH CONTRAST (INCLUDING MRCP) TECHNIQUE: Multiplanar multisequence MR imaging of the abdomen was performed both before and after the administration of intravenous contrast. Heavily T2-weighted images of the biliary and pancreatic ducts were obtained, and three-dimensional MRCP images were rendered by post processing. CONTRAST:  17mL MULTIHANCE GADOBENATE DIMEGLUMINE 529 MG/ML IV SOLN COMPARISON:  Ultrasound and CT of 1 day prior. FINDINGS: Portions of exam are mild to moderately motion degraded. Lower chest: Development of small bilateral pleural effusions. Normal heart size. No pericardial effusion. Hepatobiliary: Marked hepatic steatosis. No focal liver lesion. Multiple gallstones. No specific evidence of acute cholecystitis. No intrahepatic duct dilatation. The common duct is normal, including at 4 mm on image 2/series 9. Compressed in the region of the pancreatic head, likely due to edema. No choledocholithiasis. Pancreas: Progression of moderate pancreatic and peripancreatic edema. The pancreas enhances normally. Small volume peripancreatic fluid, including within the lesser sac. No well-defined fluid collection. No pancreatic duct dilatation. Spleen:  Normal in size, without focal abnormality. Adrenals/Urinary Tract: Left adrenal thickening. Maintenance of adreniform shape. Normal right adrenal gland. Bilateral small renal cysts. No hydronephrosis. Stomach/Bowel: Normal stomach. The descending and transverse duodenum appear thick walled, specially on  image 77/ series 16109. Likely secondary to pancreatitis. Large and small bowel loops are otherwise unremarkable. Vascular/Lymphatic: Aortic and branch vessel atherosclerosis. Patent portal and splenic veins. No retroperitoneal or retrocrural adenopathy. Other:  Development of small volume abdominal ascites. Musculoskeletal: Convex right lumbar spine curvature. IMPRESSION: 1. Progression of moderate pancreatitis.  No acute complication. 2. Cholelithiasis. 3. No biliary duct dilatation or evidence of choledocholithiasis. 4. Development of small volume abdominal ascites and small bilateral pleural effusions. Electronically Signed   By: Jeronimo Greaves M.D.   On: 03/24/2016 21:44    Anti-infectives: Anti-infectives    Start     Dose/Rate Route Frequency Ordered Stop   03/24/16 1400  metroNIDAZOLE (FLAGYL) IVPB 500 mg     500 mg 100 mL/hr over 60 Minutes Intravenous Every 8 hours 03/24/16 1213     03/24/16 1300  ciprofloxacin (CIPRO) IVPB 400 mg     400 mg 200 mL/hr over 60 Minutes Intravenous Every 12 hours 03/24/16 1213     03/23/16 1115  piperacillin-tazobactam (ZOSYN) IVPB 3.375 g     3.375 g 12.5 mL/hr over 240 Minutes Intravenous  Once 03/23/16 1112 03/23/16 1255       Assessment/Plan Gallstone pancreatitis - CT - small gallstones in the gallbladder, acute pancreatitis with retroperitoneal edema but no pseudocyst or abscess - U/s - Cholelithiasis. Mild gallbladder wall thickening but no tenderness to sonographic interrogation or pericholecystic fluid. Common bile duct upper limits normal, 6.5 mm. - bilirubin continues to rise, today 7.3 from 3.6 - MRCP showed Cholelithiasis but no choledocholithiasis.  - lipase normal - continued abdominal pain, about the same as yesterday Leukocytosis - WBC trending down 25.6 today  from 37.2 - afebrile, intermittently hypertensive and tachycardic  Hyperglycemia HTN H/o pulmonary lobectomy 2011  ID - zosyn day 4 VTE - lovenox FEN - NPO  Plan  - GI consulted due to increasing bilirubin, although MRCP reported cholelithiasis but no choledocholithiasis. Decrease IVF to avoid fluid overload. WBC trending down. OR maybe tomorrow. Continue NPO.   LOS: 3 days    Edson SnowballBROOKE A MILLER , [facility] 03/26/2016, 8:00 AM Pager: [phone] Consults: [phone] Mon-Fri 7:00 am-4:30 pm Sat-Sun 7:00 am-11:30 am

## 2016-03-26 NOTE — Plan of Care (Signed)
RN, Dara, paged NP to look at CT scan results. NP reviewed results of CT and chart. Pt here with pancreatitis secondary to gallstones. Surgery and GI on board. Surgery is waiting for pancreatitis to resolve before performing choleycystectomy. GI saw pt today for continued rise in bilirubin. A r/p CT was ordered to evaluate pt for obstruction, necrosis or other issues causing his bili to rise and because pt continues to be symptomatic. Results of CT showed fluid surrounding pancrease which is likely blood products/hemorrhagic pancreatitis. No pseudocysts noted. NP called Dr. Madilyn FiremanHayes, on call for GI, and we discussed pt, case, labs, test results, symptoms, surgery notes, etc. Pt has had no escalation in pain tonight or increased fevers. WBCC is trending down and pt remains on antibiotics. Given this, GI recommends no urgent action tonight. Per GI note from today, pt will likely need alternative method of nutrition tomorrow. Will follow. Plan discussed with Lynwood Dawleyara, RN.  KJKG, NP [facility]

## 2016-03-26 NOTE — Progress Notes (Signed)
PROGRESS NOTE  Farris HasGraham N Fluegge WGN:[id number] DOB: 1934/05/19 DOA: 03/23/2016 PCP: Verl BangsADIONTCHENKO, ALEXEI, MD  HPI/Recap of past 24 hours:  Mild pain-more controlled after use of oxycodone No vomit no cp No feve rnor chills  no diarr Still npo  Assessment/Plan: Active Problems:   Essential hypertension   Abdominal pain   Acute pancreatitis  1. Acute pancreatitis-, lipase 648 on admission, repeat on 10/9 is 165>>45, likely gallstone pancreatitis, CBD measures upper limits of normal 6.5 mm . nothing by mouth, continue IVF. ,MRCP no cbd stone. Gen. surgery is following-likely surgery in next 24-48 hr 2. ? Choledocholithiasis-proabably CBG stone-Gen surgery has consulted [facility]-await input.  I had a detailed discussion regarding this with family and drew pictures for them 3. Leukocytosis- likely from dehydration and stress/pancreatitis, no fever, bp stable, empirically abx with iv cipro/flagyl,  lactic acid 1.52. procalcitonin is  0.17, doubt infection. Will continue cycle lactic acid and procacitonin. mrcp 03/24/16 with progression of pancreatitis, no cbd stone 4. Hypertension/sinus tachcyardia- hold lisinopril at this time, start lopressor iv, continue  hydralazine 10 mg IV prn . 5.  Hyperglycemia- blood glucose is elevated to 202, hemoglobin A1c 5.9   DVT Prophylaxis-   Lovenox , hold lovenox if surgery planned   Family Communication: patient and his family daily  Code Status:   DNR  Disposition Plan: remain in the hospital   Consultants:  General surgery  Procedures:  none  Antibiotics:  none   Objective: BP (!) 177/87 (BP Location: Left Arm)   Pulse (!) 101   Temp 97.9 F (36.6 C) (Oral)   Resp 16   Ht 5\' 10"  (1.778 m)   Wt 77.1 kg (170 lb)   SpO2 97%   BMI 24.39 kg/m   Intake/Output Summary (Last 24 hours) at 03/26/16 1333 Last data filed at 03/26/16 0904  Gross per 24 hour  Intake           2477.5 ml  Output             1875 ml  Net             602.5 ml   Filed Weights   03/23/16 0759  Weight: 77.1 kg (170 lb)    Exam:   General:  in pain  Cardiovascular: RRR  Respiratory: CTABL  Abdomen: +tender right upper quadrant,  positive BS  Musculoskeletal: No Edema  Neuro: aaox3  Data Reviewed: Basic Metabolic Panel:  Recent Labs Lab 03/23/16 0819 03/24/16 0416 03/25/16 0455 03/26/16 0511  NA 137 136 136 137  K 3.5 4.7 4.0 3.5  CL 103 104 106 108  CO2 23 25 23 23   GLUCOSE 202* 227* 139* 101*  BUN 28* 24* 21* 21*  CREATININE 1.02 0.99 0.66 0.48*  CALCIUM 8.9 8.1* 8.0* 7.9*   Liver Function Tests:  Recent Labs Lab 03/23/16 0819 03/24/16 0416 03/25/16 0455 03/26/16 0511  AST 36 41 59* 63*  ALT 44 36 36 49  ALKPHOS 45 42 40 37*  BILITOT 1.6* 2.1* 3.6* 7.3*  PROT 7.2 6.7 5.4* 5.8*  ALBUMIN 4.1 3.7 3.0* 3.1*    Recent Labs Lab 03/23/16 0819 03/24/16 0416 03/25/16 0455  LIPASE 648* 165* 45   No results for input(s): AMMONIA in the last 168 hours. CBC:  Recent Labs Lab 03/23/16 0819 03/24/16 0416 03/25/16 0455 03/26/16 0511  WBC 24.0* 37.2* 25.6* 18.4*  NEUTROABS 22.1*  --   --   --   HGB 17.0 16.5 15.2 14.3  HCT  47.6 48.5 44.8 42.0  MCV 94.8 97.6 97.0 95.2  PLT 220 204 166 174   Cardiac Enzymes:   No results for input(s): CKTOTAL, CKMB, CKMBINDEX, TROPONINI in the last 168 hours. BNP (last 3 results) No results for input(s): BNP in the last 8760 hours.  ProBNP (last 3 results) No results for input(s): PROBNP in the last 8760 hours.  CBG: No results for input(s): GLUCAP in the last 168 hours.  No results found for this or any previous visit (from the past 240 hour(s)).   Studies: No results found.  Scheduled Meds: . chlorhexidine  15 mL Mouth Rinse BID  . ciprofloxacin  400 mg Intravenous Q12H  . enoxaparin (LOVENOX) injection  40 mg Subcutaneous Daily  . famotidine (PEPCID) IV  20 mg Intravenous Q12H  . mouth rinse  15 mL Mouth Rinse q12n4p  . metoprolol  5 mg  Intravenous Q6H  . metronidazole  500 mg Intravenous Q8H    Continuous Infusions: . sodium chloride 100 mL/hr at 03/26/16 0829     Time spent:  Pleas Koch, MD [facility] (P) [phone]

## 2016-03-27 ENCOUNTER — Inpatient Hospital Stay (HOSPITAL_COMMUNITY): Payer: Medicare Other

## 2016-03-27 LAB — CBC WITH DIFFERENTIAL/PLATELET
BASOS PCT: 0 %
Basophils Absolute: 0 10*3/uL (ref 0.0–0.1)
EOS ABS: 0.1 10*3/uL (ref 0.0–0.7)
EOS PCT: 1 %
HCT: 38.1 % — ABNORMAL LOW (ref 39.0–52.0)
HEMOGLOBIN: 13.4 g/dL (ref 13.0–17.0)
LYMPHS ABS: 1 10*3/uL (ref 0.7–4.0)
Lymphocytes Relative: 6 %
MCH: 32.1 pg (ref 26.0–34.0)
MCHC: 35.2 g/dL (ref 30.0–36.0)
MCV: 91.1 fL (ref 78.0–100.0)
Monocytes Absolute: 1.8 10*3/uL — ABNORMAL HIGH (ref 0.1–1.0)
Monocytes Relative: 10 %
NEUTROS PCT: 83 %
Neutro Abs: 14.7 10*3/uL — ABNORMAL HIGH (ref 1.7–7.7)
PLATELETS: 165 10*3/uL (ref 150–400)
RBC: 4.18 MIL/uL — AB (ref 4.22–5.81)
RDW: 13.3 % (ref 11.5–15.5)
WBC: 17.6 10*3/uL — AB (ref 4.0–10.5)

## 2016-03-27 LAB — COMPREHENSIVE METABOLIC PANEL
ALK PHOS: 44 U/L (ref 38–126)
ALT: 50 U/L (ref 17–63)
AST: 57 U/L — AB (ref 15–41)
Albumin: 2.7 g/dL — ABNORMAL LOW (ref 3.5–5.0)
Anion gap: 8 (ref 5–15)
BILIRUBIN TOTAL: 5.9 mg/dL — AB (ref 0.3–1.2)
BUN: 19 mg/dL (ref 6–20)
CALCIUM: 7.6 mg/dL — AB (ref 8.9–10.3)
CHLORIDE: 105 mmol/L (ref 101–111)
CO2: 23 mmol/L (ref 22–32)
CREATININE: 0.61 mg/dL (ref 0.61–1.24)
Glucose, Bld: 99 mg/dL (ref 65–99)
Potassium: 3.1 mmol/L — ABNORMAL LOW (ref 3.5–5.1)
Sodium: 136 mmol/L (ref 135–145)
Total Protein: 5.4 g/dL — ABNORMAL LOW (ref 6.5–8.1)

## 2016-03-27 LAB — GLUCOSE, CAPILLARY
Glucose-Capillary: 111 mg/dL — ABNORMAL HIGH (ref 65–99)
Glucose-Capillary: 107 mg/dL — ABNORMAL HIGH (ref 65–99)

## 2016-03-27 LAB — PROTIME-INR
INR: 1.23
PROTHROMBIN TIME: 15.5 s — AB (ref 11.4–15.2)

## 2016-03-27 MED ORDER — JEVITY 1.2 CAL PO LIQD
1000.0000 mL | ORAL | Status: DC
  Filled 2016-03-27: qty 1000

## 2016-03-27 MED ORDER — INSULIN ASPART 100 UNIT/ML ~~LOC~~ SOLN
0.0000 [IU] | Freq: Four times a day (QID) | SUBCUTANEOUS | Status: DC
  Administered 2016-03-28: 2 [IU] via SUBCUTANEOUS
  Administered 2016-03-28: 1 [IU] via SUBCUTANEOUS

## 2016-03-27 MED ORDER — FAT EMULSION 20 % IV EMUL
120.0000 mL | INTRAVENOUS | Status: AC
  Administered 2016-03-27: 120 mL via INTRAVENOUS
  Filled 2016-03-27: qty 250

## 2016-03-27 MED ORDER — POTASSIUM CHLORIDE 10 MEQ/100ML IV SOLN
10.0000 meq | INTRAVENOUS | Status: AC
  Administered 2016-03-27 (×3): 10 meq via INTRAVENOUS
  Filled 2016-03-27 (×3): qty 100

## 2016-03-27 MED ORDER — SODIUM CHLORIDE 0.9 % IV SOLN
500.0000 mg | Freq: Four times a day (QID) | INTRAVENOUS | Status: DC
  Administered 2016-03-27 – 2016-04-04 (×32): 500 mg via INTRAVENOUS
  Filled 2016-03-27 (×33): qty 500

## 2016-03-27 MED ORDER — SODIUM CHLORIDE 0.9 % IV SOLN
INTRAVENOUS | Status: DC
  Administered 2016-03-27 – 2016-03-31 (×4): via INTRAVENOUS

## 2016-03-27 MED ORDER — SODIUM CHLORIDE 0.9% FLUSH
10.0000 mL | INTRAVENOUS | Status: DC | PRN
  Administered 2016-03-31 – 2016-04-07 (×4): 10 mL
  Filled 2016-03-27 (×4): qty 40

## 2016-03-27 MED ORDER — TRACE MINERALS CR-CU-MN-SE-ZN 10-1000-500-60 MCG/ML IV SOLN
INTRAVENOUS | Status: AC
  Administered 2016-03-27: 21:00:00 via INTRAVENOUS
  Filled 2016-03-27: qty 960

## 2016-03-27 NOTE — Progress Notes (Signed)
PHARMACY - ADULT TOTAL PARENTERAL NUTRITION CONSULT NOTE   Pharmacy Consult for TPN Indication: pancreatitis, unable to tolerate TFs  Patient Measurements: Height: 5\' 10"  (177.8 cm) Weight: 170 lb (77.1 kg) IBW/kg (Calculated) : 73 TPN AdjBW (KG): 77.1 Body mass index is 24.39 kg/m.   Current Nutrition: now NPO after failed TFs  Insulin Requirements: n/a  IVF: NS at 100 ml/hr  Central access: PICC line to be placed 10/12 TPN start date: 10/12  ASSESSMENT                                                                                                          HPI: 82 y.o. male admitted on 03/23/2016 with severe acute pancreatitis secondary to gallstones with plan initially to start tube feeds but now to start TPN per pharmacy. Plan per surgery if medical management for now but may need cholecystectomy in future.  Significant events:   Today:   Glucose - will start CBG checks  Electrolytes - K 3.1 this AM - no replacement ordered yet so will do so this evening  Renal - SCr stable  LFTs - AST slightly elevated at 57, ALT WNL  TGs - to check tom AM  Prealbumin - to check tom AM  NUTRITIONAL GOALS                                                                                             RD recs on 10/12: 112 g protein/day, 2180 kcal/day  Clinimix E 5/20 at a goal rate of 80 ml/hr + 20% fat emulsion at 10 ml/hr to provide: 96 g/day protein, 2169 Kcal/day. Note that cannot get to goal protein per RD rec's because of shortage of Clinimix bags currently. At goal rate, will provide approx 83% of protein needs and 99% of kcal needs  PLAN                                                                                                                         At 2000 today:  Start Clinimix E 5/20 at 8440ml/hr.  20% fat emulsion at 75ml/hr.  Plan to advance as tolerated to the goal rate.  TPN  to contain standard multivitamins and trace elements.  Reduce IVF to 21ml/hr.  Give  4 runs of IV KCL  Add CBGs/SSI q6.   TPN lab panels on Mondays & Thursdays.  F/u daily.   Hessie Knows, PharmD, BCPS Pager [phone] 03/27/2016 4:40 PM

## 2016-03-27 NOTE — Progress Notes (Signed)
Peripherally Inserted Central Catheter/Midline Placement  The IV Nurse has discussed with the patient and/or persons authorized to consent for the patient, the purpose of this procedure and the potential benefits and risks involved with this procedure.  The benefits include less needle sticks, lab draws from the catheter, and the patient may be discharged home with the catheter. Risks include, but not limited to, infection, bleeding, blood clot (thrombus formation), and puncture of an artery; nerve damage and irregular heartbeat and possibility to perform a PICC exchange if needed/ordered by physician.  Alternatives to this procedure were also discussed.  Bard Power PICC patient education guide, fact sheet on infection prevention and patient information card has been provided to patient /or left at bedside.    PICC/Midline Placement Documentation  PICC Double Lumen 03/27/16 PICC Right Brachial 42 cm 1 cm (Active)  Indication for Insertion or Continuance of Line Administration of hyperosmolar/irritating solutions (i.e. TPN, Vancomycin, etc.) 03/27/2016  8:25 PM  Exposed Catheter (cm) 1 cm 03/27/2016  8:25 PM  Site Assessment Clean;Dry;Intact 03/27/2016  8:25 PM  Lumen #1 Status Blood return noted;Flushed;Saline locked 03/27/2016  8:25 PM  Lumen #2 Status Blood return noted;Flushed;Saline locked 03/27/2016  8:25 PM  Dressing Type Transparent 03/27/2016  8:25 PM  Dressing Status Clean;Dry;Intact;Antimicrobial disc in place 03/27/2016  8:25 PM  Dressing Intervention New dressing 03/27/2016  8:25 PM  Dressing Change Due 04/03/16 03/27/2016  8:25 PM       Netta Corriganhomas, Oma Alpert L 03/27/2016, 8:47 PM

## 2016-03-27 NOTE — Progress Notes (Signed)
Pharmacy Antibiotic Note  Colin West is a 82 y.o. male admitted on 03/23/2016 with severe acute pancreatitis secondary to gallstones currently on ciprofloxacin and flagyl therapy. Pharmacy has been consulted to transition antibiotics to Primaxin dosing. NKDA.    CrCl ~77 ml/min, no hepatic adjustment necessary  Plan: Primaxin 500mg  IV q6h F/u renal function, cultures, clinical course  Height: 5\' 10"  (177.8 cm) Weight: 170 lb (77.1 kg) IBW/kg (Calculated) : 73  Temp (24hrs), Avg:97.9 F (36.6 C), Min:97.5 F (36.4 C), Max:98.4 F (36.9 C)   Recent Labs Lab 03/23/16 0819 03/23/16 1146 03/24/16 0416 03/25/16 0455 03/26/16 0511 03/27/16 0500  WBC 24.0*  --  37.2* 25.6* 18.4* 17.6*  CREATININE 1.02  --  0.99 0.66 0.48* 0.61  LATICACIDVEN  --  1.52  --  2.1*  --   --     Estimated Creatinine Clearance: 76 mL/min (by C-G formula based on SCr of 0.61 mg/dL).    No Known Allergies  Antimicrobials this admission: 10/8 Zosyn x 1  10/9 Ciprofloxacin >> 10/12 10/9 Flagyl >> 10/12 10/12 Primaxin >>   Dose adjustments this admission:  Microbiology results: None  Thank you for allowing pharmacy to be a part of this patient's care.  Haynes Hoehnolleen Twana Wileman, PharmD, BCPS 03/27/2016, 10:02 AM  Pager: [phone]

## 2016-03-27 NOTE — Progress Notes (Signed)
Initial Nutrition Assessment  DOCUMENTATION CODES:   Non-severe (moderate) malnutrition in context of acute illness/injury  INTERVENTION:   Tube Feeding Recommendations: Please consult for TF management if RD to manage orders. Initiate Osmolite 1.5 @ 20 ml/hr, advance by 10 ml every 6 hours to goal rate of 55 ml/hr.  30 ml Prostat BID This regimen provides 2180 kcal (100% of needs), 112g protein and 1005 ml H2O.  RD to continue to monitor  NUTRITION DIAGNOSIS:   Increased nutrient needs related to other (see comment) (acute pancreatitis) as evidenced by estimated needs.  GOAL:   Patient will meet greater than or equal to 90% of their needs  MONITOR:   Labs, Weight trends, TF tolerance, I & O's  REASON FOR ASSESSMENT:   Consult New TPN/TNA ( After discussion with surgery, plan for longer nasojejunal tube today rather than TPN)  ASSESSMENT:   82 y.o. male, With history of depression, kidney stones, hypertension came to the ED with worsening abdominal pain and vomiting. Patient says that he vomited 4 times last night and has a constant epigastric pain. He denies diarrhea. No fever or chills. No dysuria. No chest pain or shortness of breath.  Patient in room with daughter at bedside. Pt has had NJ tube placed. Pt drowsy but pt's daughter was able to answer most questions. Pt reports eating 3 meals a day consisting of "anything I wanted". However, then states his appetite has been poor but was unable to state how long his appetite has been poor. Pt has not eaten since admission per daughter so 4 days now. Pt states he has lost weight, UBW is 185 lb. Pt's daughter confirms that this weight loss has been steady over a period of years.  Nutrition-Focused physical exam completed. Findings are mild fat depletion, moderate-severe muscle depletion, and no edema.   Per consults placed, pt was to have Cortrak tube placed, however [facility] Per surgery, they did not want pt  to have to transfer. TPN consult was placed but then changed to enteral feeds via NJ tube. If RD to manage TF, need consult for tube feeding management. Recommendations provided above when tube is ready to be used.  Medications reviewed. Labs reviewed: Low K  Diet Order:  Diet NPO time specified  Skin:  Reviewed, no issues  Last BM:  10/7  Height:   Ht Readings from Last 1 Encounters:  03/23/16 5\' 10"  (1.778 m)    Weight:   Wt Readings from Last 1 Encounters:  03/23/16 170 lb (77.1 kg)    Ideal Body Weight:  75.5 kg  BMI:  Body mass index is 24.39 kg/m.  Estimated Nutritional Needs:   Kcal:  [id number]  Protein:  110-120g  Fluid:  2-2.2L/day  EDUCATION NEEDS:   Education needs addressed  Tilda FrancoLindsey Cainen Burnham, MS, RD, LDN Pager: [phone] After Hours Pager: [phone]

## 2016-03-27 NOTE — Progress Notes (Signed)
Subjective: Some persistent periumbilical abdominal pain.  Objective: Vital signs in last 24 hours: Temp:  [97.5 F (36.4 C)-98.4 F (36.9 C)] 97.7 F (36.5 C) (10/12 0524) Pulse Rate:  [66-99] 66 (10/12 0524) Resp:  [17-18] 18 (10/12 0524) BP: (152-172)/(79-95) 156/90 (10/12 0524) SpO2:  [92 %-97 %] 92 % (10/12 0524) Weight change:  Last BM Date: 03/22/16  PE: GEN:  Somnolent but arousable ABD:  Protuberant, bit distended, scant bowel sounds  Lab Results: CBC    Component Value Date/Time   WBC 17.6 (H) 03/27/2016 0500   RBC 4.18 (L) 03/27/2016 0500   HGB 13.4 03/27/2016 0500   HCT 38.1 (L) 03/27/2016 0500   PLT 165 03/27/2016 0500   MCV 91.1 03/27/2016 0500   MCH 32.1 03/27/2016 0500   MCHC 35.2 03/27/2016 0500   RDW 13.3 03/27/2016 0500   LYMPHSABS 1.0 03/27/2016 0500   MONOABS 1.8 (H) 03/27/2016 0500   EOSABS 0.1 03/27/2016 0500   BASOSABS 0.0 03/27/2016 0500   CMP     Component Value Date/Time   NA 136 03/27/2016 0500   K 3.1 (L) 03/27/2016 0500   CL 105 03/27/2016 0500   CO2 23 03/27/2016 0500   GLUCOSE 99 03/27/2016 0500   BUN 19 03/27/2016 0500   CREATININE 0.61 03/27/2016 0500   CALCIUM 7.6 (L) 03/27/2016 0500   PROT 5.4 (L) 03/27/2016 0500   ALBUMIN 2.7 (L) 03/27/2016 0500   AST 57 (H) 03/27/2016 0500   ALT 50 03/27/2016 0500   ALKPHOS 44 03/27/2016 0500   BILITOT 5.9 (H) 03/27/2016 0500   GFRNONAA >60 03/27/2016 0500   GFRAA >60 03/27/2016 0500    Studies/Results: Ct Abdomen Pelvis W Contrast  Result Date: 03/26/2016 CLINICAL DATA:  82 year old with pancreatitis. History of periumbilical and lower abdominal pain. Abnormal LFTs. EXAM: CT ABDOMEN AND PELVIS WITH CONTRAST TECHNIQUE: Multidetector CT imaging of the abdomen and pelvis was performed using the standard protocol following bolus administration of intravenous contrast. CONTRAST:  ISOVUE-300 IOPAMIDOL (ISOVUE-300) INJECTION 61% COMPARISON:  CT 03/23/2016.  MRI 03/24/2016 FINDINGS:  Lower chest: Pleural-based calcified granuloma at the left lung base. Small bilateral pleural effusions with bibasilar atelectasis. Coronary artery calcifications. Hepatobiliary: Liver is diffusely low density and suggestive for steatosis. Portal venous system is patent. High-density material in the gallbladder could be related to sludge or vicarious contrast excretion from previous CT imaging. Evidence for gallstones. No significant gallbladder distension. Pancreas: There is diffuse edema and fluid surrounding the entire pancreas. The amount of edema and fluid has markedly increased since 03/23/2016. No large areas of pancreas necrosis. Spleen: Normal appearance of spleen without enlargement. Splenic vein is patent. Adrenals/Urinary Tract: Stable thickening of the left adrenal gland. Normal appearance of the right adrenal gland. There may be 1 or 2 small nonobstructive stones in the right kidney along with small cysts. Probable cyst in the left kidney interpolar region without hydronephrosis. Again noted is a 3 mm stone at the left ureterovesical junction. There is no significant left ureter dilatation. Minimal wall thickening along the right side of bladder is nonspecific. Stomach/Bowel: Large amount of edema and fluid surrounding the duodenum. There is mild dilatation of small bowel loops without obstruction. Increased fluid within the central abdominal mesentery. Colonic diverticulosis without acute colonic inflammation. Normal appearance of the appendix. Vascular/Lymphatic: The abdominal aorta is heavily calcified without aneurysm. There is flow in the major visceral arteries. Iliac arteries are heavily calcified but patent. Splenic vein and portal vein are patent. The portal confluence is  patent. However, there is narrowing of the proximal SMV seen on both phases of imaging. This is demonstrated on sequence 3, image 37 and felt to be related to compression rather than thrombus at this time. Renal veins and IVC  are patent. No enlarged lymph nodes in the abdomen or pelvis. Reproductive: Prostate is prominent measuring up to 6.2 cm with calcifications. Other: There is a large amount of fluid in the pelvis. There is slightly more dense fluid along the right side of the pelvis suggestive for blood products. In addition, some of the abdominal fluid is dense measuring up to 34 Hounsfield units. Findings are compatible with blood products. No free air. Musculoskeletal: Disc space loss at L2-L3. Multilevel disc disease in lumbar spine with bilateral facet arthropathy. IMPRESSION: Progression of the pancreatitis with a large amount of fluid and edema surrounding the pancreas. In addition, some of this fluid is hyperdense and suggestive for blood products. Findings are compatible with hemorrhagic pancreatitis. No large areas of pancreatic necrosis or defined pseudocyst formations. Splenic vein and portal venous system are patent but there is compression and narrowing of the proximal superior mesenteric vein. This vein is at risk for thrombosis. Increased fluid in the pelvis with some blood products. Cholelithiasis. Nonobstructive 3 mm stone at the left ureterovesical junction. Mild bladder wall thickening along the right side is nonspecific. Prostate hypertrophy. Probable right kidney stones. Consider a non emergent Urology consultation. Small bilateral pleural effusions with atelectasis at the lung bases. Hepatic steatosis. These results will be called to the ordering clinician or representative by the Radiologist Assistant, and communication documented in the PACS or zVision Dashboard. Electronically Signed   By: Richarda OverlieAdam  Henn M.D.   On: 03/26/2016 19:34   Assessment:  1.  Gallstone pancreatitis, moderate-to-severe, now complicated by hemorrhagic pancreatitis. 2.  Abdominal pain, likely from #1 above. 3.  Elevated LFTs, principally of bilirubin, suspect infection/inflammation-mediated cholestasis.  No evidence of biliary  obstruction on updated CT scan yesterday. 4.  Feeding difficulties. 5.  Failure-to-thrive.  Plan:  1.  Based on recent clinical course and progression of pancreatitis on CT scan, I do not feel that patient should have a cholecystectomy in the near future. 2.  I suggest focusing on medical management of his pancreatitis and optimizing his nutritional status, with thoughts of cholecystectomy down-the-road, once his pancreatitis has improved, as outpatient. 3.  Will order nasojejunal tube placement; once appropriate positioning is confirmed, would obtain Nutritionist consultation for assistance with tube feeds (daily caloric needs and dosing and administration). 4.  There is no evidence of biliary obstruction and patient has progression of his pancreatitis; will stop Cipro and Flagyl (both of which have minimal pancreatic penetration), and will start imipenem instead (which does tend to have better pancreatic penetration). 5.  Judicious analgesics as needed.  Continued IV fluids. 6.  OOBTC as tolerated. 7.  Had extensive discussion with family, over 30 minutes, involving the nature of patient's treatment and the likelihood that recovery can very well take several weeks, if not months.  Will likely need repeat CT scan in 3-4 weeks to reassess things. 8.  [facility] will follow.   Freddy JakschOUTLAW,Airel Magadan M 03/27/2016, 10:24 AM   Pager [phone] If no answer or after 5 PM call [phone]

## 2016-03-27 NOTE — Progress Notes (Signed)
03/27/16  0924 [facility] [address]. Per Jimmy Picketasey [facility]. Will notify MD.

## 2016-03-27 NOTE — Progress Notes (Signed)
PROGRESS NOTE  Colin West RUE:[id number] DOB: [birth year] DOA: 03/23/2016 PCP: [name], MD  HPI/Recap of past 24 hours:  Sleepy, wishes to shower No vomit no cp  no diarr, flatus +, feels bloated to some degree Still npo   Assessment/Plan: Principal Problem:   Acute pancreatitis Active Problems:   Essential hypertension   Abdominal pain   Hyperbilirubinemia  1. Acute hemorrhagic pancreatitis-, lipase 648 on admission, repeat on 10/9 is 165>>45, likely gallstone pancreatitis, CBD measures upper limits of normal 6.5 mm . nothing by mouth, continue IVF. ,MRCP no cbd stone. Gen. Surgery/GI input appreciated.  ct abd 10/11 hemorrhagic pancreatitis without pseudocyst or necrosis. Will need parenteral feeding and Long GI tube to be placed for this purpose. 2. ? Choledocholithiasis-proabably CBG stone-Gen surgery has consulted [facility]-recommending parenteral feeds.  RPT 3. Leukocytosis- likely from dehydration and stress/pancreatitis, no fever, bp stable, empirically abx with iv cipro/flagyl-->Primaxin 03/27/16,  lactic acid 1.52. procalcitonin is  0.17, doubt infection. Will continue cycle lactic acid and procacitonin. mrcp 03/24/16 with progression of pancreatitis, no cbd stone 4. Hypertension/sinus tachcyardia- hold lisinopril at this time, start lopressor iv, continue  hydralazine 10 mg IV prn . 5.  Hyperglycemia- blood glucose partially elevated at times.  Monitor am labs, hemoglobin A1c 5.9   DVT Prophylaxis-   Lovenox , hold lovenox if surgery planned   Family Communication: patient and his family daily  Code Status:   DNR  Disposition Plan: remain in the hospital   Consultants:  General surgery  Procedures:  none  Antibiotics:  flagyll 10/9-10/12  Cipro-10/12  10/9  Primaxin 10/12---   Objective: BP (!) 156/90 (BP Location: Left Arm)   Pulse 66   Temp 97.7 F (36.5 C) (Oral)   Resp 18   Ht 5\' 10"  (1.778 m)   Wt 77.1 kg (170 lb)    SpO2 92%   BMI 24.39 kg/m   Intake/Output Summary (Last 24 hours) at 03/27/16 1016 Last data filed at 03/27/16 0718  Gross per 24 hour  Intake             2790 ml  Output             1250 ml  Net             1540 ml   Filed Weights   03/23/16 0759  Weight: 77.1 kg (170 lb)    Exam:   General:  in pain but bearable.  Seems a little sleepier  Cardiovascular: RRR, no m/r/g  Respiratory: CTABL  Abdomen: +tender right upper quadrant,  positive BS  Musculoskeletal: No Edema  Neuro: aaox3  Data Reviewed: Basic Metabolic Panel:  Recent Labs Lab 03/23/16 0819 03/24/16 0416 03/25/16 0455 03/26/16 0511 03/27/16 0500  NA 137 136 136 137 136  K 3.5 4.7 4.0 3.5 3.1*  CL 103 104 106 108 105  CO2 23 25 23 23 23   GLUCOSE 202* 227* 139* 101* 99  BUN 28* 24* 21* 21* 19  CREATININE 1.02 0.99 0.66 0.48* 0.61  CALCIUM 8.9 8.1* 8.0* 7.9* 7.6*   Liver Function Tests:  Recent Labs Lab 03/23/16 0819 03/24/16 0416 03/25/16 0455 03/26/16 0511 03/27/16 0500  AST 36 41 59* 63* 57*  ALT 44 36 36 49 50  ALKPHOS 45 42 40 37* 44  BILITOT 1.6* 2.1* 3.6* 7.3* 5.9*  PROT 7.2 6.7 5.4* 5.8* 5.4*  ALBUMIN 4.1 3.7 3.0* 3.1* 2.7*    Recent Labs Lab 03/23/16 1478 03/24/16 0416 03/25/16 0455  LIPASE 648* 165* 45   No results for input(s): AMMONIA in the last 168 hours. CBC:  Recent Labs Lab 03/23/16 0819 03/24/16 0416 03/25/16 0455 03/26/16 0511 03/27/16 0500  WBC 24.0* 37.2* 25.6* 18.4* 17.6*  NEUTROABS 22.1*  --   --   --  14.7*  HGB 17.0 16.5 15.2 14.3 13.4  HCT 47.6 48.5 44.8 42.0 38.1*  MCV 94.8 97.6 97.0 95.2 91.1  PLT 220 204 166 174 165   Cardiac Enzymes:   No results for input(s): CKTOTAL, CKMB, CKMBINDEX, TROPONINI in the last 168 hours. BNP (last 3 results) No results for input(s): BNP in the last 8760 hours.  ProBNP (last 3 results) No results for input(s): PROBNP in the last 8760 hours.  CBG: No results for input(s): GLUCAP in the last 168  hours.  No results found for this or any previous visit (from the past 240 hour(s)).   Studies: Ct Abdomen Pelvis W Contrast  Result Date: 03/26/2016 CLINICAL DATA:  82 year old with pancreatitis. History of periumbilical and lower abdominal pain. Abnormal LFTs. EXAM: CT ABDOMEN AND PELVIS WITH CONTRAST TECHNIQUE: Multidetector CT imaging of the abdomen and pelvis was performed using the standard protocol following bolus administration of intravenous contrast. CONTRAST:  ISOVUE-300 IOPAMIDOL (ISOVUE-300) INJECTION 61% COMPARISON:  CT 03/23/2016.  MRI 03/24/2016 FINDINGS: Lower chest: Pleural-based calcified granuloma at the left lung base. Small bilateral pleural effusions with bibasilar atelectasis. Coronary artery calcifications. Hepatobiliary: Liver is diffusely low density and suggestive for steatosis. Portal venous system is patent. High-density material in the gallbladder could be related to sludge or vicarious contrast excretion from previous CT imaging. Evidence for gallstones. No significant gallbladder distension. Pancreas: There is diffuse edema and fluid surrounding the entire pancreas. The amount of edema and fluid has markedly increased since 03/23/2016. No large areas of pancreas necrosis. Spleen: Normal appearance of spleen without enlargement. Splenic vein is patent. Adrenals/Urinary Tract: Stable thickening of the left adrenal gland. Normal appearance of the right adrenal gland. There may be 1 or 2 small nonobstructive stones in the right kidney along with small cysts. Probable cyst in the left kidney interpolar region without hydronephrosis. Again noted is a 3 mm stone at the left ureterovesical junction. There is no significant left ureter dilatation. Minimal wall thickening along the right side of bladder is nonspecific. Stomach/Bowel: Large amount of edema and fluid surrounding the duodenum. There is mild dilatation of small bowel loops without obstruction. Increased fluid within  the central abdominal mesentery. Colonic diverticulosis without acute colonic inflammation. Normal appearance of the appendix. Vascular/Lymphatic: The abdominal aorta is heavily calcified without aneurysm. There is flow in the major visceral arteries. Iliac arteries are heavily calcified but patent. Splenic vein and portal vein are patent. The portal confluence is patent. However, there is narrowing of the proximal SMV seen on both phases of imaging. This is demonstrated on sequence 3, image 37 and felt to be related to compression rather than thrombus at this time. Renal veins and IVC are patent. No enlarged lymph nodes in the abdomen or pelvis. Reproductive: Prostate is prominent measuring up to 6.2 cm with calcifications. Other: There is a large amount of fluid in the pelvis. There is slightly more dense fluid along the right side of the pelvis suggestive for blood products. In addition, some of the abdominal fluid is dense measuring up to 34 Hounsfield units. Findings are compatible with blood products. No free air. Musculoskeletal: Disc space loss at L2-L3. Multilevel disc disease in lumbar spine with bilateral  facet arthropathy. IMPRESSION: Progression of the pancreatitis with a large amount of fluid and edema surrounding the pancreas. In addition, some of this fluid is hyperdense and suggestive for blood products. Findings are compatible with hemorrhagic pancreatitis. No large areas of pancreatic necrosis or defined pseudocyst formations. Splenic vein and portal venous system are patent but there is compression and narrowing of the proximal superior mesenteric vein. This vein is at risk for thrombosis. Increased fluid in the pelvis with some blood products. Cholelithiasis. Nonobstructive 3 mm stone at the left ureterovesical junction. Mild bladder wall thickening along the right side is nonspecific. Prostate hypertrophy. Probable right kidney stones. Consider a non emergent Urology consultation. Small  bilateral pleural effusions with atelectasis at the lung bases. Hepatic steatosis. These results will be called to the ordering clinician or representative by the Radiologist Assistant, and communication documented in the PACS or zVision Dashboard. Electronically Signed   By: Richarda OverlieAdam  Henn M.D.   On: 03/26/2016 19:34    Scheduled Meds: . chlorhexidine  15 mL Mouth Rinse BID  . enoxaparin (LOVENOX) injection  40 mg Subcutaneous Daily  . famotidine (PEPCID) IV  20 mg Intravenous Q12H  . imipenem-cilastatin  500 mg Intravenous Q6H  . mouth rinse  15 mL Mouth Rinse q12n4p  . metoprolol  5 mg Intravenous Q6H    Continuous Infusions: . sodium chloride 100 mL/hr at 03/26/16 [id number]     Time spent: 15 mins  Pleas KochJai Devontaye Ground, MD [facility] [phone]) [phone]

## 2016-03-27 NOTE — Progress Notes (Signed)
Patient ID: Colin West, male   DOB: 05/12/1934, 82 y.o.   MRN: [id number]  [facility] Surgery Progress Note     Subjective: Continues to have abdominal pain. No new complaints.  Objective: Vital signs in last 24 hours: Temp:  [97.5 F (36.4 C)-98.4 F (36.9 C)] 97.7 F (36.5 C) (10/12 0524) Pulse Rate:  [66-99] 66 (10/12 0524) Resp:  [17-18] 18 (10/12 0524) BP: (152-172)/(79-95) 156/90 (10/12 0524) SpO2:  [92 %-97 %] 92 % (10/12 0524) Last BM Date: 03/22/16  Intake/Output from previous day: 10/11 0701 - 10/12 0700 In: 3914.2 [I.V.:3114.2; IV Piggyback:800] Out: 1350 [Urine:1350] Intake/Output this shift: Total I/O In: -  Out: 100 [Urine:100]  PE: Gen: alert, pleasant, uncomfortable Abd: Soft, ND, no TTP, no guarding, present but hypoactive BS  Lab Results:   Recent Labs  03/26/16 0511 03/27/16 0500  WBC 18.4* 17.6*  HGB 14.3 13.4  HCT 42.0 38.1*  PLT 174 165   BMET  Recent Labs  03/26/16 0511 03/27/16 0500  NA 137 136  K 3.5 3.1*  CL 108 105  CO2 23 23  GLUCOSE 101* 99  BUN 21* 19  CREATININE 0.48* 0.61  CALCIUM 7.9* 7.6*   PT/INR  Recent Labs  03/27/16 0500  LABPROT 15.5*  INR 1.23   CMP     Component Value Date/Time   NA 136 03/27/2016 0500   K 3.1 (L) 03/27/2016 0500   CL 105 03/27/2016 0500   CO2 23 03/27/2016 0500   GLUCOSE 99 03/27/2016 0500   BUN 19 03/27/2016 0500   CREATININE 0.61 03/27/2016 0500   CALCIUM 7.6 (L) 03/27/2016 0500   PROT 5.4 (L) 03/27/2016 0500   ALBUMIN 2.7 (L) 03/27/2016 0500   AST 57 (H) 03/27/2016 0500   ALT 50 03/27/2016 0500   ALKPHOS 44 03/27/2016 0500   BILITOT 5.9 (H) 03/27/2016 0500   GFRNONAA >60 03/27/2016 0500   GFRAA >60 03/27/2016 0500   Lipase     Component Value Date/Time   LIPASE 45 03/25/2016 0455       Studies/Results: Ct Abdomen Pelvis W Contrast  Result Date: 03/26/2016 CLINICAL DATA:  82 year old with pancreatitis. History of periumbilical and lower abdominal  pain. Abnormal LFTs. EXAM: CT ABDOMEN AND PELVIS WITH CONTRAST TECHNIQUE: Multidetector CT imaging of the abdomen and pelvis was performed using the standard protocol following bolus administration of intravenous contrast. CONTRAST:  ISOVUE-300 IOPAMIDOL (ISOVUE-300) INJECTION 61% COMPARISON:  CT 03/23/2016.  MRI 03/24/2016 FINDINGS: Lower chest: Pleural-based calcified granuloma at the left lung base. Small bilateral pleural effusions with bibasilar atelectasis. Coronary artery calcifications. Hepatobiliary: Liver is diffusely low density and suggestive for steatosis. Portal venous system is patent. High-density material in the gallbladder could be related to sludge or vicarious contrast excretion from previous CT imaging. Evidence for gallstones. No significant gallbladder distension. Pancreas: There is diffuse edema and fluid surrounding the entire pancreas. The amount of edema and fluid has markedly increased since 03/23/2016. No large areas of pancreas necrosis. Spleen: Normal appearance of spleen without enlargement. Splenic vein is patent. Adrenals/Urinary Tract: Stable thickening of the left adrenal gland. Normal appearance of the right adrenal gland. There may be 1 or 2 small nonobstructive stones in the right kidney along with small cysts. Probable cyst in the left kidney interpolar region without hydronephrosis. Again noted is a 3 mm stone at the left ureterovesical junction. There is no significant left ureter dilatation. Minimal wall thickening along the right side of bladder is nonspecific. Stomach/Bowel: Large amount  of edema and fluid surrounding the duodenum. There is mild dilatation of small bowel loops without obstruction. Increased fluid within the central abdominal mesentery. Colonic diverticulosis without acute colonic inflammation. Normal appearance of the appendix. Vascular/Lymphatic: The abdominal aorta is heavily calcified without aneurysm. There is flow in the major visceral  arteries. Iliac arteries are heavily calcified but patent. Splenic vein and portal vein are patent. The portal confluence is patent. However, there is narrowing of the proximal SMV seen on both phases of imaging. This is demonstrated on sequence 3, image 37 and felt to be related to compression rather than thrombus at this time. Renal veins and IVC are patent. No enlarged lymph nodes in the abdomen or pelvis. Reproductive: Prostate is prominent measuring up to 6.2 cm with calcifications. Other: There is a large amount of fluid in the pelvis. There is slightly more dense fluid along the right side of the pelvis suggestive for blood products. In addition, some of the abdominal fluid is dense measuring up to 34 Hounsfield units. Findings are compatible with blood products. No free air. Musculoskeletal: Disc space loss at L2-L3. Multilevel disc disease in lumbar spine with bilateral facet arthropathy. IMPRESSION: Progression of the pancreatitis with a large amount of fluid and edema surrounding the pancreas. In addition, some of this fluid is hyperdense and suggestive for blood products. Findings are compatible with hemorrhagic pancreatitis. No large areas of pancreatic necrosis or defined pseudocyst formations. Splenic vein and portal venous system are patent but there is compression and narrowing of the proximal superior mesenteric vein. This vein is at risk for thrombosis. Increased fluid in the pelvis with some blood products. Cholelithiasis. Nonobstructive 3 mm stone at the left ureterovesical junction. Mild bladder wall thickening along the right side is nonspecific. Prostate hypertrophy. Probable right kidney stones. Consider a non emergent Urology consultation. Small bilateral pleural effusions with atelectasis at the lung bases. Hepatic steatosis. These results will be called to the ordering clinician or representative by the Radiologist Assistant, and communication documented in the PACS or zVision Dashboard.  Electronically Signed   By: Richarda OverlieAdam  Henn M.D.   On: 03/26/2016 19:34    Anti-infectives: Anti-infectives    Start     Dose/Rate Route Frequency Ordered Stop   03/24/16 1400  metroNIDAZOLE (FLAGYL) IVPB 500 mg     500 mg 100 mL/hr over 60 Minutes Intravenous Every 8 hours 03/24/16 1213     03/24/16 1300  ciprofloxacin (CIPRO) IVPB 400 mg     400 mg 200 mL/hr over 60 Minutes Intravenous Every 12 hours 03/24/16 1213     03/23/16 1115  piperacillin-tazobactam (ZOSYN) IVPB 3.375 g     3.375 g 12.5 mL/hr over 240 Minutes Intravenous  Once 03/23/16 1112 03/23/16 1255       Assessment/Plan Gallstone pancreatitis - CT 10/8 - small gallstones in the gallbladder, acute pancreatitis with retroperitoneal edema but no pseudocyst or abscess - U/s 10/8 - Cholelithiasis. Mild gallbladder wall thickening but no tenderness to sonographic interrogation or pericholecystic fluid. Common bile duct upper limits normal, 6.5 mm. - bilirubin slightly down 5.9 from 7.3 - MRCP 10/9 showed Cholelithiasis but no choledocholithiasis.  - lipase now normal - repeat CT yesterday showed progression of the pancreatitis with a large amount of fluid and edema surrounding the pancreas - benign abdominal exam today with no tenderness Leukocytosis - WBC trending down 17.6 from 18.4, afebrile  Hyperglycemia HTN H/o pulmonary lobectomy 2011  ID - zosyn day 5 VTE - lovenox FEN - NPO except  sips/ice chips, IVF  Plan - CT shows worsening pancreatitis, recommend continued medical treatment of this prior to any surgical intervention. Appreciate GI and medicine recommendations. WBC trending down. Continue NPO except sips/ice chips. Will initiate postpyloric feeding tube via long nasojejunal feeding tube. Encourage IS.   LOS: 4 days    Edson Snowball , [facility] Surgery 03/27/2016, 8:24 AM Pager: [phone] Consults: [phone] Mon-Fri 7:00 am-4:30 pm Sat-Sun 7:00 am-11:30 am

## 2016-03-27 NOTE — Care Management Important Message (Signed)
Important Message  Patient Details  Name: Colin West MRN: [id number] Date of Birth: [birth year]   Medicare Important Message Given:  Yes    Haskell FlirtJamison, Arrietty Dercole 03/27/2016, 9:25 AMImportant Message  Patient Details  Name: Colin West MRN: [id number] Date of Birth: [birth year]   Medicare Important Message Given:  Yes    Haskell FlirtJamison, Chayden Garrelts 03/27/2016, 9:25 AM

## 2016-03-28 ENCOUNTER — Inpatient Hospital Stay (HOSPITAL_COMMUNITY): Payer: Medicare Other

## 2016-03-28 DIAGNOSIS — E440 Moderate protein-calorie malnutrition: Secondary | ICD-10-CM | POA: Insufficient documentation

## 2016-03-28 LAB — COMPREHENSIVE METABOLIC PANEL
ALBUMIN: 2.5 g/dL — AB (ref 3.5–5.0)
ALK PHOS: 50 U/L (ref 38–126)
ALT: 47 U/L (ref 17–63)
ANION GAP: 5 (ref 5–15)
AST: 51 U/L — ABNORMAL HIGH (ref 15–41)
BUN: 27 mg/dL — ABNORMAL HIGH (ref 6–20)
CHLORIDE: 109 mmol/L (ref 101–111)
CO2: 23 mmol/L (ref 22–32)
Calcium: 7.8 mg/dL — ABNORMAL LOW (ref 8.9–10.3)
Creatinine, Ser: 0.55 mg/dL — ABNORMAL LOW (ref 0.61–1.24)
GFR calc non Af Amer: >60 mL/min (ref >60)
GLUCOSE: 209 mg/dL — AB (ref 65–99)
Potassium: 3.8 mmol/L (ref 3.5–5.1)
SODIUM: 137 mmol/L (ref 135–145)
Total Bilirubin: 5 mg/dL — ABNORMAL HIGH (ref 0.3–1.2)
Total Protein: 5.4 g/dL — ABNORMAL LOW (ref 6.5–8.1)

## 2016-03-28 LAB — PHOSPHORUS: PHOSPHORUS: 1.6 mg/dL — AB (ref 2.5–4.6)

## 2016-03-28 LAB — GLUCOSE, CAPILLARY
GLUCOSE-CAPILLARY: 149 mg/dL — AB (ref 65–99)
GLUCOSE-CAPILLARY: 176 mg/dL — AB (ref 65–99)
GLUCOSE-CAPILLARY: 205 mg/dL — AB (ref 65–99)
GLUCOSE-CAPILLARY: 217 mg/dL — AB (ref 65–99)
Glucose-Capillary: 188 mg/dL — ABNORMAL HIGH (ref 65–99)
Glucose-Capillary: 174 mg/dL — ABNORMAL HIGH (ref 65–99)
Glucose-Capillary: 130 mg/dL — ABNORMAL HIGH (ref 65–99)

## 2016-03-28 LAB — CBC
HCT: 37.6 % — ABNORMAL LOW (ref 39.0–52.0)
HEMOGLOBIN: 12.7 g/dL — AB (ref 13.0–17.0)
MCH: 32.5 pg (ref 26.0–34.0)
MCHC: 33.8 g/dL (ref 30.0–36.0)
MCV: 96.2 fL (ref 78.0–100.0)
PLATELETS: 170 10*3/uL (ref 150–400)
RBC: 3.91 MIL/uL — AB (ref 4.22–5.81)
RDW: 14.2 % (ref 11.5–15.5)
WBC: 18 10*3/uL — AB (ref 4.0–10.5)

## 2016-03-28 LAB — DIFFERENTIAL
BASOS ABS: 0 10*3/uL (ref 0.0–0.1)
Basophils Relative: 0 %
EOS PCT: 1 %
Eosinophils Absolute: 0.2 10*3/uL (ref 0.0–0.7)
LYMPHS ABS: 1.2 10*3/uL (ref 0.7–4.0)
LYMPHS PCT: 7 %
Monocytes Absolute: 2.3 10*3/uL — ABNORMAL HIGH (ref 0.1–1.0)
Monocytes Relative: 13 %
NEUTROS ABS: 14.3 10*3/uL — AB (ref 1.7–7.7)
NEUTROS PCT: 79 %

## 2016-03-28 LAB — TRIGLYCERIDES: TRIGLYCERIDES: 179 mg/dL — AB (ref <150)

## 2016-03-28 LAB — PREALBUMIN: Prealbumin: 6.3 mg/dL — ABNORMAL LOW (ref 18–38)

## 2016-03-28 LAB — MAGNESIUM: MAGNESIUM: 2.2 mg/dL (ref 1.7–2.4)

## 2016-03-28 LAB — PROCALCITONIN: PROCALCITONIN: 0.76 ng/mL

## 2016-03-28 MED ORDER — TRAMADOL HCL 50 MG PO TABS
50.0000 mg | ORAL_TABLET | Freq: Four times a day (QID) | ORAL | Status: DC | PRN
  Administered 2016-03-30 – 2016-04-06 (×7): 50 mg via ORAL
  Filled 2016-03-28 (×7): qty 1

## 2016-03-28 MED ORDER — JEVITY 1.2 CAL PO LIQD
1000.0000 mL | ORAL | Status: DC
  Filled 2016-03-28: qty 1000

## 2016-03-28 MED ORDER — METOCLOPRAMIDE HCL 5 MG/5ML PO SOLN
10.0000 mg | Freq: Four times a day (QID) | ORAL | Status: AC
Start: 2016-03-28 — End: 2016-03-30
  Administered 2016-03-28 – 2016-03-30 (×8): 10 mg
  Filled 2016-03-28 (×9): qty 10

## 2016-03-28 MED ORDER — POTASSIUM CHLORIDE 10 MEQ/100ML IV SOLN
INTRAVENOUS | Status: AC
  Filled 2016-03-28: qty 100

## 2016-03-28 MED ORDER — POTASSIUM PHOSPHATES 15 MMOLE/5ML IV SOLN
15.0000 mmol | Freq: Once | INTRAVENOUS | Status: AC
  Administered 2016-03-28: 15 mmol via INTRAVENOUS
  Filled 2016-03-28: qty 5

## 2016-03-28 MED ORDER — PRO-STAT SUGAR FREE PO LIQD
30.0000 mL | Freq: Two times a day (BID) | ORAL | Status: DC
  Administered 2016-03-28 – 2016-04-07 (×17): 30 mL
  Filled 2016-03-28 (×18): qty 30

## 2016-03-28 MED ORDER — INSULIN ASPART 100 UNIT/ML ~~LOC~~ SOLN
0.0000 [IU] | SUBCUTANEOUS | Status: DC
  Administered 2016-03-28: 1 [IU] via SUBCUTANEOUS
  Administered 2016-03-28: 2 [IU] via SUBCUTANEOUS
  Administered 2016-03-28: 3 [IU] via SUBCUTANEOUS
  Administered 2016-03-28: 2 [IU] via SUBCUTANEOUS
  Administered 2016-03-29: 1 [IU] via SUBCUTANEOUS
  Administered 2016-03-29 (×4): 2 [IU] via SUBCUTANEOUS
  Administered 2016-03-30 (×2): 3 [IU] via SUBCUTANEOUS
  Administered 2016-03-30 – 2016-03-31 (×6): 2 [IU] via SUBCUTANEOUS
  Administered 2016-03-31 (×2): 3 [IU] via SUBCUTANEOUS
  Administered 2016-03-31 – 2016-04-01 (×3): 2 [IU] via SUBCUTANEOUS
  Administered 2016-04-01: 5 [IU] via SUBCUTANEOUS
  Administered 2016-04-01: 2 [IU] via SUBCUTANEOUS
  Administered 2016-04-01: 3 [IU] via SUBCUTANEOUS
  Administered 2016-04-01: 2 [IU] via SUBCUTANEOUS
  Administered 2016-04-01: 3 [IU] via SUBCUTANEOUS
  Administered 2016-04-02 (×3): 2 [IU] via SUBCUTANEOUS
  Administered 2016-04-02: 3 [IU] via SUBCUTANEOUS
  Administered 2016-04-02: 2 [IU] via SUBCUTANEOUS
  Administered 2016-04-02: 3 [IU] via SUBCUTANEOUS
  Administered 2016-04-03: 1 [IU] via SUBCUTANEOUS
  Administered 2016-04-03 (×2): 3 [IU] via SUBCUTANEOUS
  Administered 2016-04-03: 1 [IU] via SUBCUTANEOUS
  Administered 2016-04-04: 3 [IU] via SUBCUTANEOUS
  Administered 2016-04-04: 2 [IU] via SUBCUTANEOUS
  Administered 2016-04-04 (×2): 3 [IU] via SUBCUTANEOUS
  Administered 2016-04-04: 2 [IU] via SUBCUTANEOUS
  Administered 2016-04-04: 5 [IU] via SUBCUTANEOUS
  Administered 2016-04-05: 1 [IU] via SUBCUTANEOUS
  Administered 2016-04-05: 3 [IU] via SUBCUTANEOUS
  Administered 2016-04-05: 2 [IU] via SUBCUTANEOUS
  Administered 2016-04-05: 3 [IU] via SUBCUTANEOUS
  Administered 2016-04-05: 2 [IU] via SUBCUTANEOUS
  Administered 2016-04-05: 3 [IU] via SUBCUTANEOUS
  Administered 2016-04-06: 1 [IU] via SUBCUTANEOUS
  Administered 2016-04-06: 3 [IU] via SUBCUTANEOUS
  Administered 2016-04-06 (×2): 2 [IU] via SUBCUTANEOUS
  Administered 2016-04-06 – 2016-04-07 (×5): 1 [IU] via SUBCUTANEOUS
  Administered 2016-04-07: 2 [IU] via SUBCUTANEOUS

## 2016-03-28 MED ORDER — FAT EMULSION 20 % IV EMUL
120.0000 mL | INTRAVENOUS | Status: DC
  Filled 2016-03-28: qty 250

## 2016-03-28 MED ORDER — OSMOLITE 1.5 CAL PO LIQD
1000.0000 mL | ORAL | Status: DC
  Administered 2016-03-28 – 2016-04-05 (×5): 1000 mL
  Filled 2016-03-28 (×11): qty 1000

## 2016-03-28 MED ORDER — TRACE MINERALS CR-CU-MN-SE-ZN 10-1000-500-60 MCG/ML IV SOLN
INTRAVENOUS | Status: DC
  Filled 2016-03-28: qty 960

## 2016-03-28 MED ORDER — FREE WATER
50.0000 mL | Status: DC
  Administered 2016-03-28 – 2016-04-05 (×46): 50 mL

## 2016-03-28 MED ORDER — ALBUTEROL SULFATE (2.5 MG/3ML) 0.083% IN NEBU
2.5000 mg | INHALATION_SOLUTION | RESPIRATORY_TRACT | Status: DC | PRN
  Administered 2016-03-31: 2.5 mg via RESPIRATORY_TRACT
  Filled 2016-03-28: qty 3

## 2016-03-28 NOTE — Progress Notes (Signed)
Patient ID: Colin West, male   DOB: 1934/03/18, 82 y.o.   MRN: [id number]  [facility] Surgery Progress Note     Subjective: Very drowsy this morning. Complaining of persistent upper abdominal pain. Started on post-pyloric TF yesterday.   Objective: Vital signs in last 24 hours: Temp:  [97.6 F (36.4 C)-98.6 F (37 C)] 97.6 F (36.4 C) (10/13 0528) Pulse Rate:  [64-159] 93 (10/13 0528) Resp:  [14-17] 14 (10/13 0528) BP: (131-160)/(78-95) 153/79 (10/13 0528) SpO2:  [94 %-96 %] 95 % (10/13 0528) Last BM Date: 03/23/16  Intake/Output from previous day: 10/12 0701 - 10/13 0700 In: 2148.9 [I.V.:1648.9; IV Piggyback:500] Out: 750 [Urine:750] Intake/Output this shift: No intake/output data recorded.  PE: Gen: drowsy, pleasant, NAD Pulm: auditory wheezing but lungs CTAB Abd: Soft, mildly distended, mild epigastric TTP, no guarding, present but hypoactive BS  Lab Results:   Recent Labs  03/27/16 0500 03/28/16 0445  WBC 17.6* 18.0*  HGB 13.4 12.7*  HCT 38.1* 37.6*  PLT 165 170   BMET  Recent Labs  03/27/16 0500 03/28/16 0445  NA 136 137  K 3.1* 3.8  CL 105 109  CO2 23 23  GLUCOSE 99 209*  BUN 19 27*  CREATININE 0.61 0.55*  CALCIUM 7.6* 7.8*   PT/INR  Recent Labs  03/27/16 0500  LABPROT 15.5*  INR 1.23   CMP     Component Value Date/Time   NA 137 03/28/2016 0445   K 3.8 03/28/2016 0445   CL 109 03/28/2016 0445   CO2 23 03/28/2016 0445   GLUCOSE 209 (H) 03/28/2016 0445   BUN 27 (H) 03/28/2016 0445   CREATININE 0.55 (L) 03/28/2016 0445   CALCIUM 7.8 (L) 03/28/2016 0445   PROT 5.4 (L) 03/28/2016 0445   ALBUMIN 2.5 (L) 03/28/2016 0445   AST 51 (H) 03/28/2016 0445   ALT 47 03/28/2016 0445   ALKPHOS 50 03/28/2016 0445   BILITOT 5.0 (H) 03/28/2016 0445   GFRNONAA >60 03/28/2016 0445   GFRAA >60 03/28/2016 0445   Lipase     Component Value Date/Time   LIPASE 45 03/25/2016 0455       Studies/Results: Dg Abd 1 View  Result Date:  03/27/2016 CLINICAL DATA:  Encounter for feeding tube placement. EXAM: ABDOMEN - 1 VIEW COMPARISON:  CT 03/26/2016 FINDINGS: Feeding tube is coiled in the stomach. The tip is at the gastric antrum. Oral contrast in the colon. Nonobstructive bowel gas pattern. Elevation of the right hemidiaphragm. IMPRESSION: Feeding tube tip at the gastric antrum. Electronically Signed   By: Richarda Overlie M.D.   On: 03/27/2016 11:22   Ct Abdomen Pelvis W Contrast  Result Date: 03/26/2016 CLINICAL DATA:  82 year old with pancreatitis. History of periumbilical and lower abdominal pain. Abnormal LFTs. EXAM: CT ABDOMEN AND PELVIS WITH CONTRAST TECHNIQUE: Multidetector CT imaging of the abdomen and pelvis was performed using the standard protocol following bolus administration of intravenous contrast. CONTRAST:  ISOVUE-300 IOPAMIDOL (ISOVUE-300) INJECTION 61% COMPARISON:  CT 03/23/2016.  MRI 03/24/2016 FINDINGS: Lower chest: Pleural-based calcified granuloma at the left lung base. Small bilateral pleural effusions with bibasilar atelectasis. Coronary artery calcifications. Hepatobiliary: Liver is diffusely low density and suggestive for steatosis. Portal venous system is patent. High-density material in the gallbladder could be related to sludge or vicarious contrast excretion from previous CT imaging. Evidence for gallstones. No significant gallbladder distension. Pancreas: There is diffuse edema and fluid surrounding the entire pancreas. The amount of edema and fluid has markedly increased since 03/23/2016. No  large areas of pancreas necrosis. Spleen: Normal appearance of spleen without enlargement. Splenic vein is patent. Adrenals/Urinary Tract: Stable thickening of the left adrenal gland. Normal appearance of the right adrenal gland. There may be 1 or 2 small nonobstructive stones in the right kidney along with small cysts. Probable cyst in the left kidney interpolar region without hydronephrosis. Again noted is a 3 mm stone  at the left ureterovesical junction. There is no significant left ureter dilatation. Minimal wall thickening along the right side of bladder is nonspecific. Stomach/Bowel: Large amount of edema and fluid surrounding the duodenum. There is mild dilatation of small bowel loops without obstruction. Increased fluid within the central abdominal mesentery. Colonic diverticulosis without acute colonic inflammation. Normal appearance of the appendix. Vascular/Lymphatic: The abdominal aorta is heavily calcified without aneurysm. There is flow in the major visceral arteries. Iliac arteries are heavily calcified but patent. Splenic vein and portal vein are patent. The portal confluence is patent. However, there is narrowing of the proximal SMV seen on both phases of imaging. This is demonstrated on sequence 3, image 37 and felt to be related to compression rather than thrombus at this time. Renal veins and IVC are patent. No enlarged lymph nodes in the abdomen or pelvis. Reproductive: Prostate is prominent measuring up to 6.2 cm with calcifications. Other: There is a large amount of fluid in the pelvis. There is slightly more dense fluid along the right side of the pelvis suggestive for blood products. In addition, some of the abdominal fluid is dense measuring up to 34 Hounsfield units. Findings are compatible with blood products. No free air. Musculoskeletal: Disc space loss at L2-L3. Multilevel disc disease in lumbar spine with bilateral facet arthropathy. IMPRESSION: Progression of the pancreatitis with a large amount of fluid and edema surrounding the pancreas. In addition, some of this fluid is hyperdense and suggestive for blood products. Findings are compatible with hemorrhagic pancreatitis. No large areas of pancreatic necrosis or defined pseudocyst formations. Splenic vein and portal venous system are patent but there is compression and narrowing of the proximal superior mesenteric vein. This vein is at risk for  thrombosis. Increased fluid in the pelvis with some blood products. Cholelithiasis. Nonobstructive 3 mm stone at the left ureterovesical junction. Mild bladder wall thickening along the right side is nonspecific. Prostate hypertrophy. Probable right kidney stones. Consider a non emergent Urology consultation. Small bilateral pleural effusions with atelectasis at the lung bases. Hepatic steatosis. These results will be called to the ordering clinician or representative by the Radiologist Assistant, and communication documented in the PACS or zVision Dashboard. Electronically Signed   By: Richarda OverlieAdam  Henn M.D.   On: 03/26/2016 19:34   Dg Chest Port 1 View  Result Date: 03/28/2016 CLINICAL DATA:  Acute onset of wheezing.  Initial encounter. EXAM: PORTABLE CHEST 1 VIEW COMPARISON:  Chest radiograph performed 05/31/2013 FINDINGS: The lungs are hypoexpanded, with elevation of the right hemidiaphragm. No significant pleural effusion or pneumothorax is seen. The cardiomediastinal silhouette remains normal in size. A right PICC is noted ending about the mid to distal SVC. No acute osseous abnormalities are seen. IMPRESSION: Lungs hypoexpanded, with elevation of the right hemidiaphragm. Electronically Signed   By: Roanna RaiderJeffery  Chang M.D.   On: 03/28/2016 01:48   Dg Naso/oro Frutoso ChaseGtube Thru Duo-reposition  Result Date: 03/27/2016 INDICATION: Malnutition EXAM: IR NASO/ORO GTUBE THRU DUO - REPOSITION COMPARISON:  Abdominal radiograph earlier same day CONTRAST:  15 cc Isovue FLUOROSCOPY TIME:  13 minutes. 34 seconds. COMPLICATIONS: None immediate PROCEDURE:  Under intermittent fluoroscopic guidance, the Dobhoff tube was manipulated in the stomach. The tip would not pass distal to the gastric antrum into the proximal duodenum. The patient tolerated the procedure well without immediate postprocedural complication. FINDINGS: The feeding tube tip is present at the gastric antrum. IMPRESSION: Multiple attempts were made to pass the tube  into the duodenum however were unsuccessful. The tube was left with the tip at the gastric antrum. Recommend follow-up KUB to assess for interval passage into the duodenum. These results will be called to the ordering clinician or representative by the Radiologist Assistant, and communication documented in the PACS or zVision Dashboard. Electronically Signed   By: Annia Belt M.D.   On: 03/27/2016 15:28    Anti-infectives: Anti-infectives    Start     Dose/Rate Route Frequency Ordered Stop   03/27/16 1015  imipenem-cilastatin (PRIMAXIN) 500 mg in sodium chloride 0.9 % 100 mL IVPB     500 mg 200 mL/hr over 30 Minutes Intravenous Every 6 hours 03/27/16 1014     03/24/16 1400  metroNIDAZOLE (FLAGYL) IVPB 500 mg  Status:  Discontinued     500 mg 100 mL/hr over 60 Minutes Intravenous Every 8 hours 03/24/16 1213 03/27/16 0954   03/24/16 1300  ciprofloxacin (CIPRO) IVPB 400 mg  Status:  Discontinued     400 mg 200 mL/hr over 60 Minutes Intravenous Every 12 hours 03/24/16 1213 03/27/16 0954   03/23/16 1115  piperacillin-tazobactam (ZOSYN) IVPB 3.375 g     3.375 g 12.5 mL/hr over 240 Minutes Intravenous  Once 03/23/16 1112 03/23/16 1255       Assessment/Plan Gallstone pancreatitis - CT 10/8 - small gallstones in the gallbladder, acute pancreatitis with retroperitoneal edema but no pseudocyst or abscess - U/s 10/8 - Cholelithiasis. Mild gallbladder wall thickening but no tenderness to sonographic interrogation or pericholecystic fluid. Common bile duct upper limits normal, 6.5 mm. - bilirubin slightly down 5 from 5.9 - MRCP 10/9 showed Cholelithiasis but no choledocholithiasis.  - repeat CT 10/11 showed progression of the pancreatitis with a large amount of fluid and edema surrounding the pancreas (possible hemorrhagic component) - minimal epigastric tenderness on exam today Leukocytosis - WBC 18, afebrile  Hyperglycemia HTN H/o pulmonary lobectomy 2011  ID - zosyn 10/8>>10/12, imipenem  10/12>> VTE - lovenox FEN - NPO except sips/ice chips, IVF, TF  Plan - continue medical management of pancreatitis, will have cholecystectomy down the road once recovered from pancreatitis. Continue post-pyloric tube feeds. Encourage IS.   LOS: 5 days    Edson Snowball , [facility] 03/28/2016, 7:35 AM Pager: [phone] Consults: [phone] Mon-Fri 7:00 am-4:30 pm Sat-Sun 7:00 am-11:30 am

## 2016-03-28 NOTE — Progress Notes (Addendum)
Patient had  asymptomatic wheezing,assessment revealed fine crackles in lower lobes. MD paged. Gave new order for albuterol q4prn sob, wheezing. & chest xray stat. IS encouraged. Will continue to monitor & assess

## 2016-03-28 NOTE — Progress Notes (Signed)
PROGRESS NOTE  Farris HasGraham N Krass ZOX:[id number] DOB: Oct 28, 1933 DOA: 03/23/2016 PCP: Verl BangsADIONTCHENKO, ALEXEI, MD  HPI/Recap of past 24 hours:  Events of yesterday noted appears to have been getting dilaudid/morphine and Oxy Appears tube was attempted at bedside He DOES NOT currently have a Feeding tube and was started on TPN. Sleepy.  Was ? Wheezy overnight and xray was bneg or any specific pathology   Assessment/Plan: Principal Problem:   Acute pancreatitis Active Problems:   Essential hypertension   Abdominal pain   Hyperbilirubinemia   Malnutrition of moderate degree  1. Acute hemorrhagic pancreatitis-, lipase 648 on admission, repeat on 10/9 is 165>>45, likely gallstone pancreatitis, CBD measures upper limits of normal 6.5 mm. nothing by mouth, continue IVF. ,MRCP no cbd stone. Gen. Surgery/GI input appreciated.  ct abd 10/11 hemorrhagic pancreatitis without pseudocyst or necrosis. Re-ordered GI tube to be placed by IR later today.  Will ask RD to help with nutrition subsequently 2. ? Choledocholithiasis-proabably CBG stone-Gen surgery has consulted [facility]-recommending parenteral feeds.  RPT CMET am.  Bilirubin 7-->5.  Monitor labs in am 3. Leukocytosis- likely from dehydration and stress/pancreatitis, no fever, bp stable, empirically abx with iv cipro/flagyl-->Primaxin by GI 03/27/16,  lactic acid 1.52. procalcitonin is  0.17, doubt infection. Will continue cycle lactic acid and procacitonin. mrcp 03/24/16 with progression of pancreatitis, no cbd stone 4. Hypertension/sinus tachcyardia- hold lisinopril at this time, start lopressor iv, continue  hydralazine 10 mg IV prn . 5.  Hyperglycemia- blood glucose partially elevated at times.  Monitor am labs, hemoglobin A1c 5.9   DVT Prophylaxis-   Lovenox , hold lovenox if surgery planned   Family Communication: patient and his family daily  Code Status:   DNR  Disposition Plan: remain in the  hospital   Consultants:  General surgery  Procedures:  none  Antibiotics:  flagyll 10/9-10/12  Cipro-10/12  10/9  Primaxin 10/12---   Objective: BP (!) 153/79 (BP Location: Left Arm)   Pulse 93   Temp 97.6 F (36.4 C) (Oral)   Resp 14   Ht 5\' 10"  (1.778 m)   Wt 77.1 kg (170 lb)   SpO2 95%   BMI 24.39 kg/m   Intake/Output Summary (Last 24 hours) at 03/28/16 0923 Last data filed at 03/28/16 0600  Gross per 24 hour  Intake          2073.92 ml  Output              650 ml  Net          1423.92 ml   Filed Weights   03/23/16 0759  Weight: 77.1 kg (170 lb)    Exam:   General:  Sleepier, but rousale  Cardiovascular: RRR, no m/r/g  Respiratory: CTAB.    Abdomen: +tender right upper quadrant,  positive BS  Musculoskeletal: No Edema  Neuro: aaox3  Data Reviewed: Basic Metabolic Panel:  Recent Labs Lab 03/24/16 0416 03/25/16 0455 03/26/16 0511 03/27/16 0500 03/28/16 0445  NA 136 136 137 136 137  K 4.7 4.0 3.5 3.1* 3.8  CL 104 106 108 105 109  CO2 25 23 23 23 23   GLUCOSE 227* 139* 101* 99 209*  BUN 24* 21* 21* 19 27*  CREATININE 0.99 0.66 0.48* 0.61 0.55*  CALCIUM 8.1* 8.0* 7.9* 7.6* 7.8*  MG  --   --   --   --  2.2  PHOS  --   --   --   --  1.6*   Liver Function Tests:  Recent Labs Lab 03/24/16 0416 03/25/16 0455 03/26/16 0511 03/27/16 0500 03/28/16 0445  AST 41 59* 63* 57* 51*  ALT 36 36 49 50 47  ALKPHOS 42 40 37* 44 50  BILITOT 2.1* 3.6* 7.3* 5.9* 5.0*  PROT 6.7 5.4* 5.8* 5.4* 5.4*  ALBUMIN 3.7 3.0* 3.1* 2.7* 2.5*    Recent Labs Lab 03/23/16 0819 03/24/16 0416 03/25/16 0455  LIPASE 648* 165* 45   No results for input(s): AMMONIA in the last 168 hours. CBC:  Recent Labs Lab 03/23/16 0819 03/24/16 0416 03/25/16 0455 03/26/16 0511 03/27/16 0500 03/28/16 0445  WBC 24.0* 37.2* 25.6* 18.4* 17.6* 18.0*  NEUTROABS 22.1*  --   --   --  14.7* 14.3*  HGB 17.0 16.5 15.2 14.3 13.4 12.7*  HCT 47.6 48.5 44.8 42.0 38.1*  37.6*  MCV 94.8 97.6 97.0 95.2 91.1 96.2  PLT 220 204 166 174 165 170   Cardiac Enzymes:   No results for input(s): CKTOTAL, CKMB, CKMBINDEX, TROPONINI in the last 168 hours. BNP (last 3 results) No results for input(s): BNP in the last 8760 hours.  ProBNP (last 3 results) No results for input(s): PROBNP in the last 8760 hours.  CBG:  Recent Labs Lab 03/27/16 1829 03/27/16 2040 03/28/16 0012 03/28/16 0406 03/28/16 0810  GLUCAP 111* 107* 149* 188* 205*    No results found for this or any previous visit (from the past 240 hour(s)).   Studies: Dg Abd 1 View  Result Date: 03/27/2016 CLINICAL DATA:  Encounter for feeding tube placement. EXAM: ABDOMEN - 1 VIEW COMPARISON:  CT 03/26/2016 FINDINGS: Feeding tube is coiled in the stomach. The tip is at the gastric antrum. Oral contrast in the colon. Nonobstructive bowel gas pattern. Elevation of the right hemidiaphragm. IMPRESSION: Feeding tube tip at the gastric antrum. Electronically Signed   By: Richarda Overlie M.D.   On: 03/27/2016 11:22   Dg Chest Port 1 View  Result Date: 03/28/2016 CLINICAL DATA:  Acute onset of wheezing.  Initial encounter. EXAM: PORTABLE CHEST 1 VIEW COMPARISON:  Chest radiograph performed 05/31/2013 FINDINGS: The lungs are hypoexpanded, with elevation of the right hemidiaphragm. No significant pleural effusion or pneumothorax is seen. The cardiomediastinal silhouette remains normal in size. A right PICC is noted ending about the mid to distal SVC. No acute osseous abnormalities are seen. IMPRESSION: Lungs hypoexpanded, with elevation of the right hemidiaphragm. Electronically Signed   By: Roanna Raider M.D.   On: 03/28/2016 01:48   Dg Naso/oro Frutoso Chase Thru Duo-reposition  Result Date: 03/27/2016 INDICATION: Malnutition EXAM: IR NASO/ORO GTUBE THRU DUO - REPOSITION COMPARISON:  Abdominal radiograph earlier same day CONTRAST:  15 cc Isovue FLUOROSCOPY TIME:  13 minutes. 34 seconds. COMPLICATIONS: None immediate  PROCEDURE: Under intermittent fluoroscopic guidance, the Dobhoff tube was manipulated in the stomach. The tip would not pass distal to the gastric antrum into the proximal duodenum. The patient tolerated the procedure well without immediate postprocedural complication. FINDINGS: The feeding tube tip is present at the gastric antrum. IMPRESSION: Multiple attempts were made to pass the tube into the duodenum however were unsuccessful. The tube was left with the tip at the gastric antrum. Recommend follow-up KUB to assess for interval passage into the duodenum. These results will be called to the ordering clinician or representative by the Radiologist Assistant, and communication documented in the PACS or zVision Dashboard. Electronically Signed   By: Annia Belt M.D.   On: 03/27/2016 15:28    Scheduled Meds: . chlorhexidine  15 mL Mouth  Rinse BID  . enoxaparin (LOVENOX) injection  40 mg Subcutaneous Daily  . famotidine (PEPCID) IV  20 mg Intravenous Q12H  . imipenem-cilastatin  500 mg Intravenous Q6H  . insulin aspart  0-9 Units Subcutaneous Q6H  . mouth rinse  15 mL Mouth Rinse q12n4p  . metoprolol  5 mg Intravenous Q6H  . potassium chloride      . potassium phosphate IVPB (mmol)  15 mmol Intravenous Once    Continuous Infusions: . sodium chloride 50 mL/hr at 03/27/16 2219  . Marland KitchenTPN (CLINIMIX-E) Adult 40 mL/hr at 03/27/16 2107   And  . fat emulsion 120 mL (03/27/16 2107)     Time spent: 25 mins  10 min discussion with famiyl about course of care  Pleas Koch, MD [facility] [phone]

## 2016-03-28 NOTE — Progress Notes (Signed)
Subjective: Having some ongoing mild abdominal pain. Unsuccessful nasoenteric feeding tube placement yesterday.  Objective: Vital signs in last 24 hours: Temp:  [97.6 F (36.4 C)-98.6 F (37 C)] 97.6 F (36.4 C) (10/13 0528) Pulse Rate:  [64-159] 93 (10/13 0528) Resp:  [14-17] 14 (10/13 0528) BP: (131-160)/(78-95) 153/79 (10/13 0528) SpO2:  [94 %-96 %] 95 % (10/13 0528) Weight change:  Last BM Date: 03/23/16  PE: GEN:  Elderly but NAD; somnolent but arousable ABD:  Protuberant, non-tender, scant/minimal bowel sounds  Lab Results: CBC    Component Value Date/Time   WBC 18.0 (H) 03/28/2016 0445   RBC 3.91 (L) 03/28/2016 0445   HGB 12.7 (L) 03/28/2016 0445   HCT 37.6 (L) 03/28/2016 0445   PLT 170 03/28/2016 0445   MCV 96.2 03/28/2016 0445   MCH 32.5 03/28/2016 0445   MCHC 33.8 03/28/2016 0445   RDW 14.2 03/28/2016 0445   LYMPHSABS 1.2 03/28/2016 0445   MONOABS 2.3 (H) 03/28/2016 0445   EOSABS 0.2 03/28/2016 0445   BASOSABS 0.0 03/28/2016 0445   CMP     Component Value Date/Time   NA 137 03/28/2016 0445   K 3.8 03/28/2016 0445   CL 109 03/28/2016 0445   CO2 23 03/28/2016 0445   GLUCOSE 209 (H) 03/28/2016 0445   BUN 27 (H) 03/28/2016 0445   CREATININE 0.55 (L) 03/28/2016 0445   CALCIUM 7.8 (L) 03/28/2016 0445   PROT 5.4 (L) 03/28/2016 0445   ALBUMIN 2.5 (L) 03/28/2016 0445   AST 51 (H) 03/28/2016 0445   ALT 47 03/28/2016 0445   ALKPHOS 50 03/28/2016 0445   BILITOT 5.0 (H) 03/28/2016 0445   GFRNONAA >60 03/28/2016 0445   GFRAA >60 03/28/2016 0445   Assessment:  1.  Gallstone pancreatitis, moderate-to-severe, now complicated by hemorrhagic pancreatitis. 2.  Abdominal pain, likely from #1 above. 3.  Elevated LFTs, principally of bilirubin, suspect infection/inflammation-mediated cholestasis.  No evidence of biliary obstruction on updated CT scan yesterday. 4.  Feeding difficulties. 5.  Failure-to-thrive.  Plan:  1.  Continue imipenem. 2.  Follow LFTs. 3.   Nasoenteric tube feeds are preferable to TPN; I have asked hospitalist to inquire of radiologists whether or not it is possible to give nasoenteric tube placement a second attempt. 4.  If it is felt not possible have nasoenteric tube placed, then TPN is really the only other viable option. 5.  Sips ice chips ok, but would not advance further at this time. 6.  [facility] will follow.   Freddy JakschOUTLAW,Anika Shore M 03/28/2016, 9:23 AM   Pager [phone] If no answer or after 5 PM call [phone]

## 2016-03-28 NOTE — Progress Notes (Signed)
Patient Colin LlanosKangaroo pump continues to alarm with family present. Family expressed concern at continued noise throughout the day.  Tubing was changed and line flushed. Alarm has ceased at this time. Staff will continue to monitor.

## 2016-03-28 NOTE — Progress Notes (Signed)
PHARMACY - ADULT TOTAL PARENTERAL NUTRITION CONSULT NOTE   Pharmacy Consult for TPN Indication: Pancreatitis, unable to tolerate TFs  Patient Measurements: Height: 5\' 10"  (177.8 cm) Weight: 170 lb (77.1 kg) IBW/kg (Calculated) : 73 TPN AdjBW (KG): 77.1 Body mass index is 24.39 kg/m.   Current Nutrition: TPN, sips ice chips  Insulin Requirements: 3 units SSI / 12 hours since initiation of TPN  IVF: NS at 50 ml/hr  Central access: PICC placed 10/12 TPN start date: 10/12  ASSESSMENT                                                                                                          HPI: 82 y.o. male admitted on 03/23/2016 with severe acute pancreatitis secondary to gallstones with plan initially to start tube feeds but now to start TPN per pharmacy. Plan per surgery is medical management for now but may need cholecystectomy in future.  Significant events:  10/12: Unsuccessful nasoenteric FT placement 10/13: Inquiry of radiologist whether or not it is possible to give nasoenteric tube placement a second attempt.  If not, TPN is only option.    Today:   Glucose - A1c 5.9, no hx DM, elevated CBGs with start of TPN  Electrolytes - K repleted and now WNL, Corr Ca, Mg, Na all WNL, Phos low  Renal - SCr stable  LFTs - AST slightly elevated, ALT WNL, TBili elevated but improving  TGs - 179 (10/13)  Prealbumin - 6.3 (10/13)  NUTRITIONAL GOALS                                                                                             RD recs on 10/12: 110-120g protein/day, 1950-2250 kcal/day, 2-2.2L fluid/day  Clinimix E 5/15 at a goal rate of 83 ml/hr + 20% fat emulsion at 10 ml/hr to provide: 100 g/day protein, 1894 Kcal/day.  Note there is a Acupuncturistnational backorder / shortage of Clinimix bags currently.  To conserve product, may not be able to meet 100% of RD recommendations.     PLAN  Now: Potassium phosphate 15 mmol  x1  IF IR unable to place FT, then tonight at 1800:  Change to Clinimix E 5/15 at 74ml/hr due to elevated CBGs  20% fat emulsion at 27ml/hr.  Consider adding 10 units insulin if CBGs remain elevated  Plan to advance as tolerated to the goal rate.  TPN to contain standard multivitamins and trace elements.  Maintain IVF to 21ml/hr.  Change to SSI q4h  TPN lab panels on Mondays & Thursdays.  Repeat CMET, Mg, Phos tomorrow AM  F/u TF plans  Watch TBili closely for adjustments with trace elements  Haynes Hoehn, PharmD, BCPS 03/28/2016, 10:28 AM  Pager: [phone]

## 2016-03-28 NOTE — Progress Notes (Signed)
NUTRITION NOTE  Consult for RD to initiate and manage TF received. Full assessment done by RD yesterday with associated note at 1158. Will order TF based on recommendations from that note:  Tube Feeding Recommendations:  Initiate Osmolite 1.5 @ 20 ml/hr, advance by 10 ml every 6 hours to goal rate of 55 ml/hr.  30 ml Prostat BID This regimen provides 2180 kcal (100% of needs), 112g protein and 1005 ml H2O.   Will also order 50 mL free water every 4 hours to provide additional 300 mL free water/day. No new weight since admission. RD will follow-up 10/16. Labs reviewed. Medications reviewed. IVF: NS @ 50 mL/hr.   Estimated Nutritional Needs:  Kcal:  [id number] Protein:  110-120g Fluid:  2-2.2L/day  Trenton GammonJessica Lilianna Case, MS, RD, LDN Inpatient Clinical Dietitian Pager # [phone] After hours/weekend pager # [phone]

## 2016-03-29 DIAGNOSIS — E440 Moderate protein-calorie malnutrition: Secondary | ICD-10-CM

## 2016-03-29 LAB — GLUCOSE, CAPILLARY
GLUCOSE-CAPILLARY: 158 mg/dL — AB (ref 65–99)
GLUCOSE-CAPILLARY: 150 mg/dL — AB (ref 65–99)
GLUCOSE-CAPILLARY: 179 mg/dL — AB (ref 65–99)
GLUCOSE-CAPILLARY: 174 mg/dL — AB (ref 65–99)
Glucose-Capillary: 186 mg/dL — ABNORMAL HIGH (ref 65–99)
Glucose-Capillary: 190 mg/dL — ABNORMAL HIGH (ref 65–99)

## 2016-03-29 LAB — CBC WITH DIFFERENTIAL/PLATELET
BASOS ABS: 0 10*3/uL (ref 0.0–0.1)
Basophils Relative: 0 %
EOS PCT: 1 %
Eosinophils Absolute: 0.2 10*3/uL (ref 0.0–0.7)
HCT: 36.8 % — ABNORMAL LOW (ref 39.0–52.0)
HEMOGLOBIN: 12.6 g/dL — AB (ref 13.0–17.0)
LYMPHS ABS: 1.2 10*3/uL (ref 0.7–4.0)
Lymphocytes Relative: 7 %
MCH: 32.5 pg (ref 26.0–34.0)
MCHC: 34.2 g/dL (ref 30.0–36.0)
MCV: 94.8 fL (ref 78.0–100.0)
MONO ABS: 2.2 10*3/uL — AB (ref 0.1–1.0)
Monocytes Relative: 13 %
NEUTROS PCT: 79 %
Neutro Abs: 13.2 10*3/uL — ABNORMAL HIGH (ref 1.7–7.7)
PLATELETS: 165 10*3/uL (ref 150–400)
RBC: 3.88 MIL/uL — AB (ref 4.22–5.81)
RDW: 14.2 % (ref 11.5–15.5)
WBC: 16.8 10*3/uL — AB (ref 4.0–10.5)

## 2016-03-29 LAB — COMPREHENSIVE METABOLIC PANEL
ALT: 58 U/L (ref 17–63)
AST: 84 U/L — ABNORMAL HIGH (ref 15–41)
Albumin: 2.6 g/dL — ABNORMAL LOW (ref 3.5–5.0)
Alkaline Phosphatase: 91 U/L (ref 38–126)
Anion gap: 7 (ref 5–15)
BILIRUBIN TOTAL: 5.5 mg/dL — AB (ref 0.3–1.2)
BUN: 20 mg/dL (ref 6–20)
CHLORIDE: 109 mmol/L (ref 101–111)
CO2: 23 mmol/L (ref 22–32)
CREATININE: 0.48 mg/dL — AB (ref 0.61–1.24)
Calcium: 7.8 mg/dL — ABNORMAL LOW (ref 8.9–10.3)
Glucose, Bld: 164 mg/dL — ABNORMAL HIGH (ref 65–99)
POTASSIUM: 3 mmol/L — AB (ref 3.5–5.1)
Sodium: 139 mmol/L (ref 135–145)
TOTAL PROTEIN: 5.5 g/dL — AB (ref 6.5–8.1)

## 2016-03-29 LAB — PHOSPHORUS: PHOSPHORUS: 1.5 mg/dL — AB (ref 2.5–4.6)

## 2016-03-29 LAB — MAGNESIUM: MAGNESIUM: 1.9 mg/dL (ref 1.7–2.4)

## 2016-03-29 MED ORDER — POTASSIUM CHLORIDE CRYS ER 20 MEQ PO TBCR
40.0000 meq | EXTENDED_RELEASE_TABLET | Freq: Every day | ORAL | Status: DC
  Filled 2016-03-29: qty 2

## 2016-03-29 MED ORDER — SACCHAROMYCES BOULARDII 250 MG PO CAPS
250.0000 mg | ORAL_CAPSULE | Freq: Two times a day (BID) | ORAL | Status: DC
  Administered 2016-03-29 – 2016-04-07 (×16): 250 mg via ORAL
  Filled 2016-03-29 (×16): qty 1

## 2016-03-29 MED ORDER — POTASSIUM CHLORIDE 20 MEQ/15ML (10%) PO SOLN
40.0000 meq | Freq: Every day | ORAL | Status: DC
  Administered 2016-03-29 – 2016-04-05 (×8): 40 meq
  Filled 2016-03-29 (×8): qty 30

## 2016-03-29 NOTE — Progress Notes (Signed)
PROGRESS NOTE  Colin West ZOX:[id number] DOB: July 12, 1933 DOA: 03/23/2016 PCP: [name], MD  HPI/Recap of past 24 hours:   Fair  Much more alert but pain is a "10" however is sitting straight up not seemingly in distress No fever no chills Small stool  Assessment/Plan: Principal Problem:   Acute pancreatitis Active Problems:   Essential hypertension   Abdominal pain   Hyperbilirubinemia   Malnutrition of moderate degree  1. Acute hemorrhagic pancreatitis-, lipase 648 on admission, repeat on 10/9 is 165>>45, likely gallstone pancreatitis, CBD measures upper limits of normal 6.5 mm. nothing by mouth for now.  MRCP no cbd stone. Gen. Surgery/GI input appreciated.  ct abd 10/11 hemorrhagic pancreatitis without pseudocyst or necrosis.  GI tube placed 10/14 and TF started.  Keep PICC until d/c home  Will ask RD to help with nutrition subsequently.  UP with therapy for dispo planning 2. ? Choledocholithiasis-proabably CBG stone-Gen surgery has consulted [facility]-recommending parenteral feeds.  RPT CMET am.  Bilirubin 7-->5. Bili is currently stable in 5 range 3. Leukocytosis- likely from dehydration and stress/pancreatitis, no fever, bp stable, empirically abx with iv cipro/flagyl-->Primaxin by GI 03/27/16,  lactic acid 1.52. procalcitonin is  0.17, doubt infectio-might require long term Primaxin until reolution of pancreatitis. Will continue n. mrcp 03/24/16 with progression of pancreatitis, no cbd stone 4. Hypertension/sinus tachcyardia- hold lisinopril at this time, start lopressor iv, continue  hydralazine 10 mg IV prn . 5.  Hyperglycemia- blood glucose partially elevated at times.  Monitor am labs, hemoglobin A1c 5.9 6. Hypokalemia-replace orally with Kdur   DVT Prophylaxis-   Lovenox , hold lovenox if surgery planned   Family Communication: patient   Code Status:   DNR  Disposition Plan: remain in the hospital Unclear dispo givne Tube feeds and IV  abx   Consultants:  General surgery  Procedures:  none  Antibiotics:  flagyll 10/9-10/12  Cipro-10/12  10/9  Primaxin 10/12---   Objective: BP (!) 163/82 (BP Location: Left Arm)   Pulse 88   Temp 97.9 F (36.6 C) (Oral)   Resp 16   Ht 5\' 10"  (1.778 m)   Wt 81.2 kg (179 lb 0.2 oz)   SpO2 96%   BMI 25.69 kg/m   Intake/Output Summary (Last 24 hours) at 03/29/16 1117 Last data filed at 03/29/16 0546  Gross per 24 hour  Intake          2180.01 ml  Output             1625 ml  Net           555.01 ml   Filed Weights   03/23/16 0759 03/29/16 0545  Weight: 77.1 kg (170 lb) 81.2 kg (179 lb 0.2 oz)    Exam:   General:  Awake alert  Cardiovascular: RRR, no m/r/g  Respiratory: CTAB.    Abdomen: +tender right upper quadrant,  positive BS, mild tender in UQ  Musculoskeletal: No Edema  Neuro: aaox3  Data Reviewed: Basic Metabolic Panel:  Recent Labs Lab 03/25/16 0455 03/26/16 0511 03/27/16 0500 03/28/16 0445 03/29/16 0432  NA 136 137 136 137 139  K 4.0 3.5 3.1* 3.8 3.0*  CL 106 108 105 109 109  CO2 23 23 23 23 23   GLUCOSE 139* 101* 99 209* 164*  BUN 21* 21* 19 27* 20  CREATININE 0.66 0.48* 0.61 0.55* 0.48*  CALCIUM 8.0* 7.9* 7.6* 7.8* 7.8*  MG  --   --   --  2.2 1.9  PHOS  --   --   --  1.6* 1.5*   Liver Function Tests:  Recent Labs Lab 03/25/16 0455 03/26/16 0511 03/27/16 0500 03/28/16 0445 03/29/16 0432  AST 59* 63* 57* 51* 84*  ALT 36 49 50 47 58  ALKPHOS 40 37* 44 50 91  BILITOT 3.6* 7.3* 5.9* 5.0* 5.5*  PROT 5.4* 5.8* 5.4* 5.4* 5.5*  ALBUMIN 3.0* 3.1* 2.7* 2.5* 2.6*    Recent Labs Lab 03/23/16 0819 03/24/16 0416 03/25/16 0455  LIPASE 648* 165* 45   No results for input(s): AMMONIA in the last 168 hours. CBC:  Recent Labs Lab 03/23/16 0819  03/25/16 0455 03/26/16 0511 03/27/16 0500 03/28/16 0445 03/29/16 0432  WBC 24.0*  < > 25.6* 18.4* 17.6* 18.0* 16.8*  NEUTROABS 22.1*  --   --   --  14.7* 14.3* 13.2*  HGB  17.0  < > 15.2 14.3 13.4 12.7* 12.6*  HCT 47.6  < > 44.8 42.0 38.1* 37.6* 36.8*  MCV 94.8  < > 97.0 95.2 91.1 96.2 94.8  PLT 220  < > 166 174 165 170 165  < > = values in this interval not displayed. Cardiac Enzymes:   No results for input(s): CKTOTAL, CKMB, CKMBINDEX, TROPONINI in the last 168 hours. BNP (last 3 results) No results for input(s): BNP in the last 8760 hours.  ProBNP (last 3 results) No results for input(s): PROBNP in the last 8760 hours.  CBG:  Recent Labs Lab 03/28/16 1547 03/28/16 1957 03/28/16 2310 03/29/16 0347 03/29/16 0816  GLUCAP 176* 174* 130* 158* 150*    No results found for this or any previous visit (from the past 240 hour(s)).   Studies: Dg Abd 1 View  Result Date: 03/28/2016 CLINICAL DATA:  Feeding tube placement EXAM: ABDOMEN - 1 VIEW COMPARISON:  None. FINDINGS: Weighted tip feeding tube appears well positioned with tip in the expected region of the proximal transverse portion of the duodenum. Oral contrast material noted within the nondistended colon. No evidence of soft tissue mass or abnormal fluid collection. Degenerative changes noted throughout the scoliotic thoracolumbar spine. No acute or suspicious osseous finding. IMPRESSION: Weighted tip feeding tube appears well positioned with tip in the expected region of the proximal transverse portion of the duodenum. Electronically Signed   By: Bary RichardStan  Maynard M.D.   On: 03/28/2016 12:51    Scheduled Meds: . chlorhexidine  15 mL Mouth Rinse BID  . enoxaparin (LOVENOX) injection  40 mg Subcutaneous Daily  . famotidine (PEPCID) IV  20 mg Intravenous Q12H  . feeding supplement (PRO-STAT SUGAR FREE 64)  30 mL Per Tube BID  . free water  50 mL Per Tube Q4H  . imipenem-cilastatin  500 mg Intravenous Q6H  . insulin aspart  0-9 Units Subcutaneous Q4H  . mouth rinse  15 mL Mouth Rinse q12n4p  . metoCLOPramide  10 mg Per Tube Q6H  . metoprolol  5 mg Intravenous Q6H  . potassium chloride  40 mEq Oral  Daily    Continuous Infusions: . sodium chloride 50 mL/hr at 03/29/16 1024  . feeding supplement (OSMOLITE 1.5 CAL) 1,000 mL (03/29/16 0000)     Time spent: 25 mins  10 min discussion with famiyl about course of care  Pleas KochJai Kerington Hildebrant, MD [facility] [phone]([phone]

## 2016-03-29 NOTE — Progress Notes (Signed)
Subjective: Seems sedated this am but answers questions. Denies abdominal pain  Objective: Vital signs in last 24 hours: Temp:  [97.8 F (36.6 C)-98.3 F (36.8 C)] 97.9 F (36.6 C) (10/14 0545) Pulse Rate:  [80-149] 88 (10/14 0545) Resp:  [14-20] 16 (10/14 0545) BP: (154-172)/(69-91) 163/82 (10/14 0545) SpO2:  [96 %-98 %] 96 % (10/14 0545) Weight:  [81.2 kg (179 lb 0.2 oz)] 81.2 kg (179 lb 0.2 oz) (10/14 0545) Last BM Date: 03/23/16  Intake/Output from previous day: 10/13 0701 - 10/14 0700 In: 2180 [I.V.:1018.3; NG/GT:506.7; IV Piggyback:655] Out: 1875 [Urine:1875] Intake/Output this shift: No intake/output data recorded.  Resp: clear to auscultation bilaterally Cardio: regular rate and rhythm GI: soft, minimal tenderness  Lab Results:   Recent Labs  03/28/16 0445 03/29/16 0432  WBC 18.0* 16.8*  HGB 12.7* 12.6*  HCT 37.6* 36.8*  PLT 170 165   BMET  Recent Labs  03/28/16 0445 03/29/16 0432  NA 137 139  K 3.8 3.0*  CL 109 109  CO2 23 23  GLUCOSE 209* 164*  BUN 27* 20  CREATININE 0.55* 0.48*  CALCIUM 7.8* 7.8*   PT/INR  Recent Labs  03/27/16 0500  LABPROT 15.5*  INR 1.23   ABG No results for input(s): PHART, HCO3 in the last 72 hours.  Invalid input(s): PCO2, PO2  Studies/Results: Dg Abd 1 View  Result Date: 03/28/2016 CLINICAL DATA:  Feeding tube placement EXAM: ABDOMEN - 1 VIEW COMPARISON:  None. FINDINGS: Weighted tip feeding tube appears well positioned with tip in the expected region of the proximal transverse portion of the duodenum. Oral contrast material noted within the nondistended colon. No evidence of soft tissue mass or abnormal fluid collection. Degenerative changes noted throughout the scoliotic thoracolumbar spine. No acute or suspicious osseous finding. IMPRESSION: Weighted tip feeding tube appears well positioned with tip in the expected region of the proximal transverse portion of the duodenum. Electronically Signed   By: Bary Richard M.D.   On: 03/28/2016 12:51   Dg Abd 1 View  Result Date: 03/27/2016 CLINICAL DATA:  Encounter for feeding tube placement. EXAM: ABDOMEN - 1 VIEW COMPARISON:  CT 03/26/2016 FINDINGS: Feeding tube is coiled in the stomach. The tip is at the gastric antrum. Oral contrast in the colon. Nonobstructive bowel gas pattern. Elevation of the right hemidiaphragm. IMPRESSION: Feeding tube tip at the gastric antrum. Electronically Signed   By: Richarda Overlie M.D.   On: 03/27/2016 11:22   Dg Chest Port 1 View  Result Date: 03/28/2016 CLINICAL DATA:  Acute onset of wheezing.  Initial encounter. EXAM: PORTABLE CHEST 1 VIEW COMPARISON:  Chest radiograph performed 05/31/2013 FINDINGS: The lungs are hypoexpanded, with elevation of the right hemidiaphragm. No significant pleural effusion or pneumothorax is seen. The cardiomediastinal silhouette remains normal in size. A right PICC is noted ending about the mid to distal SVC. No acute osseous abnormalities are seen. IMPRESSION: Lungs hypoexpanded, with elevation of the right hemidiaphragm. Electronically Signed   By: Roanna Raider M.D.   On: 03/28/2016 01:48   Dg Naso/oro Frutoso Chase Thru Duo-reposition  Result Date: 03/27/2016 INDICATION: Malnutition EXAM: IR NASO/ORO GTUBE THRU DUO - REPOSITION COMPARISON:  Abdominal radiograph earlier same day CONTRAST:  15 cc Isovue FLUOROSCOPY TIME:  13 minutes. 34 seconds. COMPLICATIONS: None immediate PROCEDURE: Under intermittent fluoroscopic guidance, the Dobhoff tube was manipulated in the stomach. The tip would not pass distal to the gastric antrum into the proximal duodenum. The patient tolerated the procedure well without immediate postprocedural complication. FINDINGS:  The feeding tube tip is present at the gastric antrum. IMPRESSION: Multiple attempts were made to pass the tube into the duodenum however were unsuccessful. The tube was left with the tip at the gastric antrum. Recommend follow-up KUB to assess for interval  passage into the duodenum. These results will be called to the ordering clinician or representative by the Radiologist Assistant, and communication documented in the PACS or zVision Dashboard. Electronically Signed   By: Annia Beltrew  Davis M.D.   On: 03/27/2016 15:28    Anti-infectives: Anti-infectives    Start     Dose/Rate Route Frequency Ordered Stop   03/27/16 1015  imipenem-cilastatin (PRIMAXIN) 500 mg in sodium chloride 0.9 % 100 mL IVPB     500 mg 200 mL/hr over 30 Minutes Intravenous Every 6 hours 03/27/16 1014     03/24/16 1400  metroNIDAZOLE (FLAGYL) IVPB 500 mg  Status:  Discontinued     500 mg 100 mL/hr over 60 Minutes Intravenous Every 8 hours 03/24/16 1213 03/27/16 0954   03/24/16 1300  ciprofloxacin (CIPRO) IVPB 400 mg  Status:  Discontinued     400 mg 200 mL/hr over 60 Minutes Intravenous Every 12 hours 03/24/16 1213 03/27/16 0954   03/23/16 1115  piperacillin-tazobactam (ZOSYN) IVPB 3.375 g     3.375 g 12.5 mL/hr over 240 Minutes Intravenous  Once 03/23/16 1112 03/23/16 1255      Assessment/Plan: s/p * No surgery found * Continue post pyloric tube feeds.  Continue primaxin No plan for surgery at this point  LOS: 6 days    TOTH III,Jacquline Terrill S 03/29/2016

## 2016-03-29 NOTE — Progress Notes (Signed)
GASTROENTEROLOGY PROGRESS NOTE  Problem:   (Presumed) gallstone pancreatitis  Subjective: Still having diffuse abd pn, not overtly worsened since TF started.  Pt's son at bedside Tawanna Cooler(Todd) indicates that pt gets pain relief w/ narcs, but then is "out of it."  No n/v.  No diarr.  TF in progress.   Objective: NAD.  Able to sit up.  Chest clear--no wheezes, no evid pleural eff.   +BS's upper abd, no succ splash.  No overt mass/tenderness, some abd protruberance (?adiposity), no evid ascites (flank tympany).  LFT's slightly higher today.  Hgb stable, WBC slt better.  Assessment: Gradually resolving GS pancreatitis, no choledocholithiasis on MRCP 5 days ago, therefore suspect persistent elev of LFT's is due to low-grade biliary obstrn from pancreatitis and/or reactive hepatopathy.  Plan: Continue curr mgt.  Repeat CT in event of signif clinical worsening.  Oral diet once pain is less.  Probiotics, in view of antbx therapy, to ?decr risk of C diff  (ordered).  Trial of heating pad (ordered) as potential narcotic-sparing intervention.  Florencia Reasonsobert V. Sae Handrich, M.D. 03/29/2016 4:28 PM  Pager [phone] If no answer or after 5 PM call [phone]

## 2016-03-30 LAB — GLUCOSE, CAPILLARY
GLUCOSE-CAPILLARY: 192 mg/dL — AB (ref 65–99)
GLUCOSE-CAPILLARY: 222 mg/dL — AB (ref 65–99)
GLUCOSE-CAPILLARY: 209 mg/dL — AB (ref 65–99)
Glucose-Capillary: 189 mg/dL — ABNORMAL HIGH (ref 65–99)
Glucose-Capillary: 186 mg/dL — ABNORMAL HIGH (ref 65–99)

## 2016-03-30 LAB — CREATININE, SERUM: CREATININE: 0.59 mg/dL — AB (ref 0.61–1.24)

## 2016-03-30 NOTE — Progress Notes (Signed)
GASTROENTEROLOGY PROGRESS NOTE  Problem:   (Presumed) gallstone pancreatitis  Subjective: Pt states pain is "much better" although still taking pain medication (morphine q 3 hrs).  No nausea.  No diarr.  Day 3 of TF, at target rate of 55 mL/hr   Objective: Afebr.  Coherent.  NAD, lying flat in bed.  Abd +BS's, NT, min distension.  No new labs.   Assessment: Clincially resolving hemorrhagic pancreatitis ? Due to gallstones  Plan: Repeat labs tomorrow.  ???trial of clr liq tomorrow if still doing well.  Florencia Reasonsobert V. Melane Windholz, M.D. 03/30/2016 9:33 AM  Pager [phone] If no answer or after 5 PM call [phone]

## 2016-03-30 NOTE — Progress Notes (Signed)
Pharmacy Antibiotic Note  Colin West is a 82 y.o. male admitted on 03/23/2016 with severe acute pancreatitis secondary to gallstones currently on ciprofloxacin and flagyl therapy. Pharmacy has been consulted to transition antibiotics to Primaxin dosing. NKDA.    10/15: remains afebrile, WBC elevated, SCr low, CrCl 60N(rounding SCr to 1). Abd pain improved. On TF. No plans for surgery.  Plan: Day 8 antibiotics. Day 4 Primaxin. Continue 500mg  IV q6h F/u renal function, cultures, clinical course  Height: 5\' 10"  (177.8 cm) Weight: 180 lb 1.9 oz (81.7 kg) IBW/kg (Calculated) : 73  Temp (24hrs), Avg:97.8 F (36.6 C), Min:97.4 F (36.3 C), Max:98.4 F (36.9 C)   Recent Labs Lab 03/23/16 1146  03/25/16 0455 03/26/16 0511 03/27/16 0500 03/28/16 0445 03/29/16 0432 03/30/16 0430  WBC  --   < > 25.6* 18.4* 17.6* 18.0* 16.8*  --   CREATININE  --   < > 0.66 0.48* 0.61 0.55* 0.48* 0.59*  LATICACIDVEN 1.52  --  2.1*  --   --   --   --   --   < > = values in this interval not displayed.  Estimated Creatinine Clearance: 76 mL/min (by C-G formula based on SCr of 0.59 mg/dL (L)).    No Known Allergies  Antimicrobials this admission: 10/8 Zosyn x 1  10/9 Ciprofloxacin >> 10/12 10/9 Flagyl >> 10/12 10/12 Primaxin >>   Dose adjustments this admission:  Microbiology results: None  Thank you for allowing pharmacy to be a part of this patient's care.  Charolotte Ekeom [name], PharmD, pager [phone]. 03/30/2016,10:36 AM.

## 2016-03-30 NOTE — Progress Notes (Signed)
PROGRESS NOTE  Colin West ZOX:[id number] DOB: 1933/12/02 DOA: 03/23/2016 PCP: [name], MD  HPI/Recap of past 24 hours:   No new issues  more awake Still abd pain but improved overall No cp No stool TF ongoing   Assessment/Plan: Principal Problem:   Acute pancreatitis Active Problems:   Essential hypertension   Abdominal pain   Hyperbilirubinemia   Malnutrition of moderate degree  1. Acute hemorrhagic pancreatitis-, lipase 648 on admission, repeat on 10/9 is 165>>45, likely gallstone pancreatitis, CBD measures upper limits of normal 6.5 mm. nothing by mouth for now.  MRCP no cbd stone. Gen. Surgery/GI input appreciated.  ct abd 10/11 hemorrhagic pancreatitis without pseudocyst or necrosis.  GI tube placed 10/14 and TF started.  Keep PICC until d/c home  Will ask RD to help with nutrition subsequently.  UP with therapy for dispo planning--can have sips and chips today to see how is tolerated 2. ? Choledocholithiasis-proabably CBG stone-Gen surgery has consulted [facility]-recommending parenteral feeds.  RPT CMET am.  Bilirubin 7-->5. Bili is currently stable in 5 range.  No new changes 3. Leukocytosis- likely from dehydration and stress/pancreatitis, no fever, bp stable, empirically abx with iv cipro/flagyl-->Primaxin by GI 03/27/16,  lactic acid 1.52. procalcitonin is  0.17, doubt infectious and is being covered.-might require long term Primaxin until reolution of pancreatitis. mrcp 03/24/16 with progression of pancreatitis, no cbd stone 4. Hypertension/sinus tachcyardia- hold lisinopril at this time, start lopressor iv, continue  hydralazine 10 mg IV prn . 5.  Hyperglycemia- blood glucose partially elevated at times.  Monitor am labs, hemoglobin A1c 5.9 6. Hypokalemia-replace orally with Kdur   DVT Prophylaxis-   Lovenox , hold lovenox if surgery planned   Family Communication: patient and daughter, grand-daughter in room  Code Status:   DNR  Disposition  Plan: remain in the hospital Unclear dispo givne Tube feeds and IV abx   Consultants:  General surgery  Procedures:  none  Antibiotics:  flagyll 10/9-10/12  Cipro-10/12  10/9  Primaxin 10/12---   Objective: BP (!) 164/83 (BP Location: Left Arm)   Pulse 98   Temp 97.6 F (36.4 C) (Oral)   Resp 14   Ht 5\' 10"  (1.778 m)   Wt 81.7 kg (180 lb 1.9 oz)   SpO2 97%   BMI 25.84 kg/m   Intake/Output Summary (Last 24 hours) at 03/30/16 1456 Last data filed at 03/30/16 1400  Gross per 24 hour  Intake          2113.34 ml  Output              650 ml  Net          1463.34 ml   Filed Weights   03/23/16 0759 03/29/16 0545 03/30/16 0522  Weight: 77.1 kg (170 lb) 81.2 kg (179 lb 0.2 oz) 81.7 kg (180 lb 1.9 oz)    Exam:   General:  Awake alert, less sleepy overall  Cardiovascular: RRR, no m/r/g  Respiratory: CTAB.    Abdomen: +tender right upper quadrant,  positive BS, mild tender in UQ  Musculoskeletal: No Edema  Neuro: aaox3  Data Reviewed: Basic Metabolic Panel:  Recent Labs Lab 03/25/16 0455 03/26/16 0511 03/27/16 0500 03/28/16 0445 03/29/16 0432 03/30/16 0430  NA 136 137 136 137 139  --   K 4.0 3.5 3.1* 3.8 3.0*  --   CL 106 108 105 109 109  --   CO2 23 23 23 23 23   --   GLUCOSE 139* 101*  99 209* 164*  --   BUN 21* 21* 19 27* 20  --   CREATININE 0.66 0.48* 0.61 0.55* 0.48* 0.59*  CALCIUM 8.0* 7.9* 7.6* 7.8* 7.8*  --   MG  --   --   --  2.2 1.9  --   PHOS  --   --   --  1.6* 1.5*  --    Liver Function Tests:  Recent Labs Lab 03/25/16 0455 03/26/16 0511 03/27/16 0500 03/28/16 0445 03/29/16 0432  AST 59* 63* 57* 51* 84*  ALT 36 49 50 47 58  ALKPHOS 40 37* 44 50 91  BILITOT 3.6* 7.3* 5.9* 5.0* 5.5*  PROT 5.4* 5.8* 5.4* 5.4* 5.5*  ALBUMIN 3.0* 3.1* 2.7* 2.5* 2.6*    Recent Labs Lab 03/24/16 0416 03/25/16 0455  LIPASE 165* 45   No results for input(s): AMMONIA in the last 168 hours. CBC:  Recent Labs Lab 03/25/16 0455  03/26/16 0511 03/27/16 0500 03/28/16 0445 03/29/16 0432  WBC 25.6* 18.4* 17.6* 18.0* 16.8*  NEUTROABS  --   --  14.7* 14.3* 13.2*  HGB 15.2 14.3 13.4 12.7* 12.6*  HCT 44.8 42.0 38.1* 37.6* 36.8*  MCV 97.0 95.2 91.1 96.2 94.8  PLT 166 174 165 170 165   Cardiac Enzymes:   No results for input(s): CKTOTAL, CKMB, CKMBINDEX, TROPONINI in the last 168 hours. BNP (last 3 results) No results for input(s): BNP in the last 8760 hours.  ProBNP (last 3 results) No results for input(s): PROBNP in the last 8760 hours.  CBG:  Recent Labs Lab 03/29/16 1945 03/29/16 2354 03/30/16 0411 03/30/16 0756 03/30/16 1157  GLUCAP 174* 186* 189* 192* 222*    No results found for this or any previous visit (from the past 240 hour(s)).   Studies: No results found.  Scheduled Meds: . chlorhexidine  15 mL Mouth Rinse BID  . enoxaparin (LOVENOX) injection  40 mg Subcutaneous Daily  . famotidine (PEPCID) IV  20 mg Intravenous Q12H  . feeding supplement (PRO-STAT SUGAR FREE 64)  30 mL Per Tube BID  . free water  50 mL Per Tube Q4H  . imipenem-cilastatin  500 mg Intravenous Q6H  . insulin aspart  0-9 Units Subcutaneous Q4H  . mouth rinse  15 mL Mouth Rinse q12n4p  . metoprolol  5 mg Intravenous Q6H  . potassium chloride  40 mEq Per Tube Daily  . saccharomyces boulardii  250 mg Oral BID    Continuous Infusions: . sodium chloride 50 mL/hr at 03/30/16 16100632  . feeding supplement (OSMOLITE 1.5 CAL) 1,000 mL (03/29/16 2040)     Time spent: 15 mins    Colin KochJai Nakeisha Greenhouse, MD [facility] [phone]

## 2016-03-30 NOTE — Progress Notes (Signed)
  Subjective: Seems more alert today. Denies abdominal pain  Objective: Vital signs in last 24 hours: Temp:  [97.4 F (36.3 C)-98.4 F (36.9 C)] 97.6 F (36.4 C) (10/15 0522) Pulse Rate:  [90-99] 98 (10/15 0522) Resp:  [14-16] 14 (10/15 0522) BP: (155-164)/(79-89) 164/83 (10/15 0522) SpO2:  [96 %-97 %] 97 % (10/15 0522) Weight:  [81.7 kg (180 lb 1.9 oz)] 81.7 kg (180 lb 1.9 oz) (10/15 0522) Last BM Date: 03/28/16  Intake/Output from previous day: 10/14 0701 - 10/15 0700 In: 1919.2 [I.V.:1109.2; NG/GT:310; IV Piggyback:500] Out: 850 [Urine:850] Intake/Output this shift: No intake/output data recorded.  Resp: clear to auscultation bilaterally Cardio: regular rate and rhythm GI: soft, minimal tenderness. flat  Lab Results:   Recent Labs  03/28/16 0445 03/29/16 0432  WBC 18.0* 16.8*  HGB 12.7* 12.6*  HCT 37.6* 36.8*  PLT 170 165   BMET  Recent Labs  03/28/16 0445 03/29/16 0432 03/30/16 0430  NA 137 139  --   K 3.8 3.0*  --   CL 109 109  --   CO2 23 23  --   GLUCOSE 209* 164*  --   BUN 27* 20  --   CREATININE 0.55* 0.48* 0.59*  CALCIUM 7.8* 7.8*  --    PT/INR No results for input(s): LABPROT, INR in the last 72 hours. ABG No results for input(s): PHART, HCO3 in the last 72 hours.  Invalid input(s): PCO2, PO2  Studies/Results: Dg Abd 1 View  Result Date: 03/28/2016 CLINICAL DATA:  Feeding tube placement EXAM: ABDOMEN - 1 VIEW COMPARISON:  None. FINDINGS: Weighted tip feeding tube appears well positioned with tip in the expected region of the proximal transverse portion of the duodenum. Oral contrast material noted within the nondistended colon. No evidence of soft tissue mass or abnormal fluid collection. Degenerative changes noted throughout the scoliotic thoracolumbar spine. No acute or suspicious osseous finding. IMPRESSION: Weighted tip feeding tube appears well positioned with tip in the expected region of the proximal transverse portion of the  duodenum. Electronically Signed   By: Bary RichardStan  Maynard M.D.   On: 03/28/2016 12:51    Anti-infectives: Anti-infectives    Start     Dose/Rate Route Frequency Ordered Stop   03/27/16 1015  imipenem-cilastatin (PRIMAXIN) 500 mg in sodium chloride 0.9 % 100 mL IVPB     500 mg 200 mL/hr over 30 Minutes Intravenous Every 6 hours 03/27/16 1014     03/24/16 1400  metroNIDAZOLE (FLAGYL) IVPB 500 mg  Status:  Discontinued     500 mg 100 mL/hr over 60 Minutes Intravenous Every 8 hours 03/24/16 1213 03/27/16 0954   03/24/16 1300  ciprofloxacin (CIPRO) IVPB 400 mg  Status:  Discontinued     400 mg 200 mL/hr over 60 Minutes Intravenous Every 12 hours 03/24/16 1213 03/27/16 0954   03/23/16 1115  piperacillin-tazobactam (ZOSYN) IVPB 3.375 g     3.375 g 12.5 mL/hr over 240 Minutes Intravenous  Once 03/23/16 1112 03/23/16 1255      Assessment/Plan: s/p * No surgery found * tolerating post pyloric tube feeds  Hemorrhagic pancreatitis. Slow improvement No cbd stone by mrcp Will likely allow to completely recover from pancreatitis before considering surgery to remove gallbladder  will follow  LOS: 7 days    TOTH III,PAUL S 03/30/2016

## 2016-03-31 LAB — COMPREHENSIVE METABOLIC PANEL
ALT: 57 U/L (ref 17–63)
AST: 57 U/L — ABNORMAL HIGH (ref 15–41)
Albumin: 2.4 g/dL — ABNORMAL LOW (ref 3.5–5.0)
Alkaline Phosphatase: 178 U/L — ABNORMAL HIGH (ref 38–126)
Anion gap: 5 (ref 5–15)
BUN: 27 mg/dL — ABNORMAL HIGH (ref 6–20)
CO2: 24 mmol/L (ref 22–32)
Calcium: 8 mg/dL — ABNORMAL LOW (ref 8.9–10.3)
Chloride: 107 mmol/L (ref 101–111)
Creatinine, Ser: 0.68 mg/dL (ref 0.61–1.24)
GFR calc Af Amer: >60 mL/min (ref >60)
GFR calc non Af Amer: >60 mL/min (ref >60)
Glucose, Bld: 197 mg/dL — ABNORMAL HIGH (ref 65–99)
Potassium: 3.9 mmol/L (ref 3.5–5.1)
Sodium: 136 mmol/L (ref 135–145)
Total Bilirubin: 2.9 mg/dL — ABNORMAL HIGH (ref 0.3–1.2)
Total Protein: 5.5 g/dL — ABNORMAL LOW (ref 6.5–8.1)

## 2016-03-31 LAB — CBC
HEMATOCRIT: 34.9 % — AB (ref 39.0–52.0)
HEMOGLOBIN: 12.1 g/dL — AB (ref 13.0–17.0)
MCH: 32.4 pg (ref 26.0–34.0)
MCHC: 34.7 g/dL (ref 30.0–36.0)
MCV: 93.6 fL (ref 78.0–100.0)
Platelets: 185 10*3/uL (ref 150–400)
RBC: 3.73 MIL/uL — ABNORMAL LOW (ref 4.22–5.81)
RDW: 14.7 % (ref 11.5–15.5)
WBC: 16.9 10*3/uL — ABNORMAL HIGH (ref 4.0–10.5)

## 2016-03-31 LAB — GLUCOSE, CAPILLARY
Glucose-Capillary: 167 mg/dL — ABNORMAL HIGH (ref 65–99)
Glucose-Capillary: 238 mg/dL — ABNORMAL HIGH (ref 65–99)

## 2016-03-31 MED ORDER — LISINOPRIL 20 MG PO TABS
40.0000 mg | ORAL_TABLET | Freq: Every morning | ORAL | Status: DC
  Administered 2016-03-31 – 2016-04-02 (×3): 40 mg via ORAL
  Administered 2016-04-04: 20 mg via ORAL
  Administered 2016-04-05 – 2016-04-07 (×3): 40 mg via ORAL
  Filled 2016-03-31 (×8): qty 2

## 2016-03-31 MED ORDER — SODIUM CHLORIDE 0.9 % IV SOLN
12.5000 mg | Freq: Once | INTRAVENOUS | Status: AC
  Administered 2016-03-31: 12.5 mg via INTRAVENOUS
  Filled 2016-03-31: qty 0.5

## 2016-03-31 NOTE — Progress Notes (Signed)
Patient ID: Colin West, male   DOB: 1933-11-15, 82 y.o.   MRN: [id number]  [facility] Progress Note     Subjective: Patient just received pain medication and is sleeping. Per family he is feeling somewhat better today. He was a little more mobile over the weekend. Tolerating small amounts clear liquids.  Objective: Vital signs in last 24 hours: Temp:  [98 F (36.7 C)-98.9 F (37.2 C)] 98.2 F (36.8 C) (10/16 0507) Pulse Rate:  [89-110] 90 (10/16 1224) Resp:  [16-18] 18 (10/16 1224) BP: (145-174)/(52-87) 174/87 (10/16 0507) SpO2:  [94 %-98 %] 95 % (10/16 1224) Weight:  [182 lb 15.7 oz (83 kg)] 182 lb 15.7 oz (83 kg) (10/16 0507) Last BM Date: 03/31/16  Intake/Output from previous day: 10/15 0701 - 10/16 0700 In: 3264.2 [I.V.:1264.2; NG/GT:1500; IV Piggyback:500] Out: 751 [Urine:750; Stool:1] Intake/Output this shift: Total I/O In: 150 [IV Piggyback:150] Out: 450 [Urine:450]  PE: Gen:  Alert, NAD, pleasant Abd: Soft, distended, nontender, +BS  Lab Results:   Recent Labs  03/29/16 0432 03/31/16 0455  WBC 16.8* 16.9*  HGB 12.6* 12.1*  HCT 36.8* 34.9*  PLT 165 185   BMET  Recent Labs  03/29/16 0432 03/30/16 0430 03/31/16 0455  NA 139  --  136  K 3.0*  --  3.9  CL 109  --  107  CO2 23  --  24  GLUCOSE 164*  --  197*  BUN 20  --  27*  CREATININE 0.48* 0.59* 0.68  CALCIUM 7.8*  --  8.0*   PT/INR No results for input(s): LABPROT, INR in the last 72 hours. CMP     Component Value Date/Time   NA 136 03/31/2016 0455   K 3.9 03/31/2016 0455   CL 107 03/31/2016 0455   CO2 24 03/31/2016 0455   GLUCOSE 197 (H) 03/31/2016 0455   BUN 27 (H) 03/31/2016 0455   CREATININE 0.68 03/31/2016 0455   CALCIUM 8.0 (L) 03/31/2016 0455   PROT 5.5 (L) 03/31/2016 0455   ALBUMIN 2.4 (L) 03/31/2016 0455   AST 57 (H) 03/31/2016 0455   ALT 57 03/31/2016 0455   ALKPHOS 178 (H) 03/31/2016 0455   BILITOT 2.9 (H) 03/31/2016 0455   GFRNONAA >60 03/31/2016 0455    GFRAA >60 03/31/2016 0455   Lipase     Component Value Date/Time   LIPASE 45 03/25/2016 0455       Studies/Results: No results found.  Anti-infectives: Anti-infectives    Start     Dose/Rate Route Frequency Ordered Stop   03/27/16 1015  imipenem-cilastatin (PRIMAXIN) 500 mg in sodium chloride 0.9 % 100 mL IVPB     500 mg 200 mL/hr over 30 Minutes Intravenous Every 6 hours 03/27/16 1014     03/24/16 1400  metroNIDAZOLE (FLAGYL) IVPB 500 mg  Status:  Discontinued     500 mg 100 mL/hr over 60 Minutes Intravenous Every 8 hours 03/24/16 1213 03/27/16 0954   03/24/16 1300  ciprofloxacin (CIPRO) IVPB 400 mg  Status:  Discontinued     400 mg 200 mL/hr over 60 Minutes Intravenous Every 12 hours 03/24/16 1213 03/27/16 0954   03/23/16 1115  piperacillin-tazobactam (ZOSYN) IVPB 3.375 g     3.375 g 12.5 mL/hr over 240 Minutes Intravenous  Once 03/23/16 1112 03/23/16 1255       Assessment/Plan Hemorrhagic pancreatitis - tolerating post-pyloric tube feeds and clears - benign abdominal exam today - Will likely allow to completely recover from pancreatitis before considering surgery to remove  gallbladder, possible 6 weeks down the road - WBC 16.9 - bilirubin trending down, now 2.9  FEN - clears, Post-pyloric TF  Plan - continue treatment of pancreatitis per GI/medicine. Will likely wait to consider cholecystectomy until patient is completely recovered from pancreatitis, possibly 6 weeks after discharge. May consider PT evaluation once patient closer to going home.   LOS: 8 days    Edson SnowballBROOKE A MILLER , [facility] 03/31/2016, 1:10 PM Pager: [phone] Consults: [phone] Mon-Fri 7:00 am-4:30 pm Sat-Sun 7:00 am-11:30 am

## 2016-03-31 NOTE — Progress Notes (Signed)
PROGRESS NOTE  Colin West ZOX:[id number] DOB: [birth year] DOA: 03/23/2016 PCP: Verl BangsADIONTCHENKO, ALEXEI, MD  HPI/Recap of past 24 hours:  Feels much beter Asking for oral diet No cp No sob No n/v Getting TF Shaved and cleaned this am abd pain signif better    Assessment/Plan: Principal Problem:   Acute pancreatitis Active Problems:   Essential hypertension   Abdominal pain   Hyperbilirubinemia   Malnutrition of moderate degree  1. Acute hemorrhagic pancreatitis-, lipase 648 on admission, repeat on 10/9 is 165>>45, likely gallstone pancreatitis, CBD measures upper limits of normal 6.5 mm. nothing by mouth for now.  MRCP no cbd stone. Gen. Surgery/GI input appreciated.  ct abd 10/11 hemorrhagic pancreatitis without pseudocyst or necrosis.  GI tube placed 10/14 and TF started.  Keep PICC until d/c home  Will ask RD to help with nutrition subsequently.  UP with therapy for dispo planning--graduate to CLD today 10/16.  hoepful to d/c Tub feeds in am and soft diet?  D/c saline 10/16 2. ? Choledocholithiasis-proabably CBG stone-Gen surgery has consulted [facility]-recommending parenteral feeds.  RPT CMET am.  Bilirubin 7-->5. Bili has dropped to 2.9  3. Leukocytosis- likely from dehydration and stress/pancreatitis, no fever, bp stable, empirically abx with iv cipro/flagyl-->Primaxin by GI 03/27/16,  lactic acid 1.52. procalcitonin is  0.17, doubt infectious and is being covered.-might require long term Primaxin until reolution of pancreatitis. mrcp 03/24/16 with progression of pancreatitis, no cbd stone 4. Hypertension/sinus tachcyardia- prn lopressor iv, continue  hydralazine 10 mg IV prn .  Add back oral meds lisinopril 40 as can take PO 5.  Hyperglycemia- blood glucose partially elevated at times.  Monitor am labs, hemoglobin A1c 5.9 6. Hypokalemia-replace orally with Kdur-stable   DVT Prophylaxis-   Lovenox , hold lovenox if surgery planned   Family Communication: patient and  daughter, grand-daughter in room  Code Status:   DNR  Disposition Plan: remain in the hospital Looks overall better Expect d/c later this week dependant on clinical course and graduation off of TF   Consultants:  General surgery  Procedures:  none  Antibiotics:  flagyll 10/9-10/12  Cipro-10/12  10/9  Primaxin 10/12---   Objective: BP (!) 174/87 (BP Location: Left Arm)   Pulse (!) 102   Temp 98.2 F (36.8 C) (Oral)   Resp 16   Ht 5\' 10"  (1.778 m)   Wt 83 kg (182 lb 15.7 oz)   SpO2 94%   BMI 26.26 kg/m   Intake/Output Summary (Last 24 hours) at 03/31/16 1001 Last data filed at 03/31/16 0838  Gross per 24 hour  Intake          2654.17 ml  Output              751 ml  Net          1903.17 ml   Filed Weights   03/29/16 0545 03/30/16 0522 03/31/16 0507  Weight: 81.2 kg (179 lb 0.2 oz) 81.7 kg (180 lb 1.9 oz) 83 kg (182 lb 15.7 oz)    Exam:   General:  Awake alert, less sleepy overall  Cardiovascular: RRR, no m/r/g  Respiratory: CTAB.    Abdomen: +tender right upper quadrant,  positive BS, mild tender in UQ  Musculoskeletal: No Edema  Neuro: aaox3  Data Reviewed: Basic Metabolic Panel:  Recent Labs Lab 03/26/16 0511 03/27/16 0500 03/28/16 0445 03/29/16 0432 03/30/16 0430 03/31/16 0455  NA 137 136 137 139  --  136  K 3.5 3.1* 3.8 3.0*  --  3.9  CL 108 105 109 109  --  107  CO2 23 23 23 23   --  24  GLUCOSE 101* 99 209* 164*  --  197*  BUN 21* 19 27* 20  --  27*  CREATININE 0.48* 0.61 0.55* 0.48* 0.59* 0.68  CALCIUM 7.9* 7.6* 7.8* 7.8*  --  8.0*  MG  --   --  2.2 1.9  --   --   PHOS  --   --  1.6* 1.5*  --   --    Liver Function Tests:  Recent Labs Lab 03/26/16 0511 03/27/16 0500 03/28/16 0445 03/29/16 0432 03/31/16 0455  AST 63* 57* 51* 84* 57*  ALT 49 50 47 58 57  ALKPHOS 37* 44 50 91 178*  BILITOT 7.3* 5.9* 5.0* 5.5* 2.9*  PROT 5.8* 5.4* 5.4* 5.5* 5.5*  ALBUMIN 3.1* 2.7* 2.5* 2.6* 2.4*    Recent Labs Lab  03/25/16 0455  LIPASE 45   No results for input(s): AMMONIA in the last 168 hours. CBC:  Recent Labs Lab 03/26/16 0511 03/27/16 0500 03/28/16 0445 03/29/16 0432 03/31/16 0455  WBC 18.4* 17.6* 18.0* 16.8* 16.9*  NEUTROABS  --  14.7* 14.3* 13.2*  --   HGB 14.3 13.4 12.7* 12.6* 12.1*  HCT 42.0 38.1* 37.6* 36.8* 34.9*  MCV 95.2 91.1 96.2 94.8 93.6  PLT 174 165 170 165 185   Cardiac Enzymes:   No results for input(s): CKTOTAL, CKMB, CKMBINDEX, TROPONINI in the last 168 hours. BNP (last 3 results) No results for input(s): BNP in the last 8760 hours.  ProBNP (last 3 results) No results for input(s): PROBNP in the last 8760 hours.  CBG:  Recent Labs Lab 03/30/16 0756 03/30/16 1157 03/30/16 1544 03/30/16 2014 03/31/16 0013  GLUCAP 192* 222* 186* 209* 167*    No results found for this or any previous visit (from the past 240 hour(s)).   Studies: No results found.  Scheduled Meds: . chlorhexidine  15 mL Mouth Rinse BID  . enoxaparin (LOVENOX) injection  40 mg Subcutaneous Daily  . famotidine (PEPCID) IV  20 mg Intravenous Q12H  . feeding supplement (PRO-STAT SUGAR FREE 64)  30 mL Per Tube BID  . free water  50 mL Per Tube Q4H  . imipenem-cilastatin  500 mg Intravenous Q6H  . insulin aspart  0-9 Units Subcutaneous Q4H  . mouth rinse  15 mL Mouth Rinse q12n4p  . metoprolol  5 mg Intravenous Q6H  . potassium chloride  40 mEq Per Tube Daily  . saccharomyces boulardii  250 mg Oral BID    Continuous Infusions: . sodium chloride 50 mL/hr at 03/31/16 0316  . feeding supplement (OSMOLITE 1.5 CAL) 1,000 mL (03/29/16 2040)     Time spent: 15 mins    Pleas Koch, MD [facility] [phone]

## 2016-03-31 NOTE — Progress Notes (Signed)
Nutrition Follow-up  DOCUMENTATION CODES:   Non-severe (moderate) malnutrition in context of acute illness/injury  INTERVENTION:   Continue Osmolite 1.5 @ goal rate of 55 ml/hr.  30 ml Prostat BID Continue free water of 50 ml every 4 hours (300 ml total) This regimen provides 2180 kcal (100% of needs), 112g protein and 1305 ml H2O.  RD to continue to monitor  NUTRITION DIAGNOSIS:   Increased nutrient needs related to other (see comment) (acute pancreatitis) as evidenced by estimated needs.  Ongoing.  GOAL:   Patient will meet greater than or equal to 90% of their needs  Meeting with TF regimen.  MONITOR:   Labs, Weight trends, TF tolerance, I & O's  ASSESSMENT:   82 y.o. male, With history of depression, kidney stones, hypertension came to the ED with worsening abdominal pain and vomiting. Patient says that he vomited 4 times last night and has a constant epigastric pain. He denies diarrhea. No fever or chills. No dysuria. No chest pain or shortness of breath.  Patient in room with no family at bedside. Patient reports feeling a little "queasy" but overall better. Pt receiving Osmolite 1.5 @ 50 ml/hr, 30 ml Prostat BID. Meeting estimated needs. Will continue to monitor weight status: currently +12 lb since admission 10/8. May need to adjust free water if continues to increase.  Medications: KCl solution daily, Florastor capsule BID Labs reviewed: CBGs: 167-209  Diet Order:  Diet NPO time specified  Skin:  Reviewed, no issues  Last BM:  10/16  Height:   Ht Readings from Last 1 Encounters:  03/23/16 5\' 10"  (1.778 m)    Weight:   Wt Readings from Last 1 Encounters:  03/31/16 182 lb 15.7 oz (83 kg)    Ideal Body Weight:  75.5 kg  BMI:  Body mass index is 26.26 kg/m.  Estimated Nutritional Needs:   Kcal:  [id number]  Protein:  110-120g  Fluid:  2-2.2L/day  EDUCATION NEEDS:   Education needs addressed  Tilda FrancoLindsey Breeze Berringer, MS, RD, LDN Pager:  [phone] After Hours Pager: [phone]

## 2016-03-31 NOTE — Progress Notes (Signed)
[facility]  Colin HasGraham N Kaeser 82 y.o. 1934-01-23   Subjective: Feels ok. Wants to eat something. Multiple family members in room.  Objective: Vital signs in last 24 hours: Vitals:   03/31/16 0507 03/31/16 1224  BP: (!) 174/87   Pulse: (!) 102 90  Resp: 16 18  Temp: 98.2 F (36.8 C)     Physical Exam: Gen: lethargic, elderly, no acute distress CV: RRR Chest: CTA B Abd: upper quadrant tenderness with guarding, soft, mild distention, +BS  Lab Results:  Recent Labs  03/29/16 0432 03/30/16 0430 03/31/16 0455  NA 139  --  136  K 3.0*  --  3.9  CL 109  --  107  CO2 23  --  24  GLUCOSE 164*  --  197*  BUN 20  --  27*  CREATININE 0.48* 0.59* 0.68  CALCIUM 7.8*  --  8.0*  MG 1.9  --   --   PHOS 1.5*  --   --     Recent Labs  03/29/16 0432 03/31/16 0455  AST 84* 57*  ALT 58 57  ALKPHOS 91 178*  BILITOT 5.5* 2.9*  PROT 5.5* 5.5*  ALBUMIN 2.6* 2.4*    Recent Labs  03/29/16 0432 03/31/16 0455  WBC 16.8* 16.9*  NEUTROABS 13.2*  --   HGB 12.6* 12.1*  HCT 36.8* 34.9*  MCV 94.8 93.6  PLT 165 185   No results for input(s): LABPROT, INR in the last 72 hours.    Assessment/Plan: Severe biliary pancreatitis with CT concerning for hemorrhagic pancreatitis. On tube feeds and broad spectrum Abx. Will change to full liquids and start advancing slowly tomorrow. Hopefully, can increase PO intake enough to stop TFs prior to d/c. Continue Abx for now. D/W family.   Eugune Sine C. 03/31/2016, 1:14 PM  Pager [phone]  If no answer or after 5 PM call [phone] ID: Colin West, male   DOB: 1934-01-23, 82 y.o.   MRN: [id number]

## 2016-04-01 LAB — CBC WITH DIFFERENTIAL/PLATELET
BASOS PCT: 0 %
Basophils Absolute: 0 10*3/uL (ref 0.0–0.1)
EOS ABS: 0.4 10*3/uL (ref 0.0–0.7)
EOS PCT: 2 %
HCT: 34.1 % — ABNORMAL LOW (ref 39.0–52.0)
Hemoglobin: 11.9 g/dL — ABNORMAL LOW (ref 13.0–17.0)
LYMPHS ABS: 0.9 10*3/uL (ref 0.7–4.0)
Lymphocytes Relative: 5 %
MCH: 32.6 pg (ref 26.0–34.0)
MCHC: 34.9 g/dL (ref 30.0–36.0)
MCV: 93.4 fL (ref 78.0–100.0)
MONOS PCT: 9 %
Monocytes Absolute: 1.6 10*3/uL — ABNORMAL HIGH (ref 0.1–1.0)
NEUTROS PCT: 84 %
Neutro Abs: 16.1 10*3/uL — ABNORMAL HIGH (ref 1.7–7.7)
PLATELETS: 197 10*3/uL (ref 150–400)
RBC: 3.65 MIL/uL — ABNORMAL LOW (ref 4.22–5.81)
RDW: 14.4 % (ref 11.5–15.5)
WBC: 19 10*3/uL — ABNORMAL HIGH (ref 4.0–10.5)

## 2016-04-01 LAB — COMPREHENSIVE METABOLIC PANEL
ALBUMIN: 2.3 g/dL — AB (ref 3.5–5.0)
ALK PHOS: 187 U/L — AB (ref 38–126)
ALT: 53 U/L (ref 17–63)
AST: 49 U/L — AB (ref 15–41)
Anion gap: 5 (ref 5–15)
BUN: 23 mg/dL — AB (ref 6–20)
CALCIUM: 8 mg/dL — AB (ref 8.9–10.3)
CHLORIDE: 105 mmol/L (ref 101–111)
CO2: 27 mmol/L (ref 22–32)
CREATININE: 0.61 mg/dL (ref 0.61–1.24)
GFR calc Af Amer: >60 mL/min (ref >60)
GFR calc non Af Amer: >60 mL/min (ref >60)
GLUCOSE: 203 mg/dL — AB (ref 65–99)
Potassium: 3.9 mmol/L (ref 3.5–5.1)
SODIUM: 137 mmol/L (ref 135–145)
Total Bilirubin: 2.3 mg/dL — ABNORMAL HIGH (ref 0.3–1.2)
Total Protein: 5.5 g/dL — ABNORMAL LOW (ref 6.5–8.1)

## 2016-04-01 NOTE — Progress Notes (Signed)
PROGRESS NOTE  Colin West WUJ:[id number] DOB: 10-02-1933 DOA: 03/23/2016 PCP: Verl BangsADIONTCHENKO, ALEXEI, MD  HPI/Recap of past 24 hours:  abd pain, hiccup yesterday Rougher night than when was NPO No cp No sob    Assessment/Plan: Principal Problem:   Acute pancreatitis Active Problems:   Essential hypertension   Abdominal pain   Hyperbilirubinemia   Malnutrition of moderate degree  Acute hemorrhagic pancreatitis-, lipase 648 on admission, repeat on 10/9 is 165>>45, likely gallstone pancreatitis, CBD measures upper limits of normal 6.5 mm. nothing by mouth for now.  MRCP no cbd stone. Gen. Surgery/GI input appreciated.  ct abd 10/11 hemorrhagic pancreatitis without pseudocyst or necrosis.  GI tube placed 10/14 and TF started.  Keep PICC until d/c home  Will ask RD to help with nutrition subsequently.  UP with therapy for dispo planning--stay on CLD today 10/17 as ? abd pain overnight 10/16.  D/c saline 10/16 1. ? Choledocholithiasis-proabably CBG stone-Gen surgery has consulted [facility]-recommending parenteral feeds.  RPT CMET am.  Bilirubin 7-->5. Bili has dropped to 2.9  2. Leukocytosis- likely from dehydration and stress/pancreatitis, no fever, bp stable, empirically abx with iv cipro/flagyl-->Primaxin by GI 03/27/16,  lactic acid 1.52. procalcitonin is  0.17, -might require long term Primaxin until reolution of pancreatitis. mrcp 03/24/16 with progression of pancreatitis, no cbd stone 3. Hypertension/sinus tachcyardia- prn lopressor iv, continue  hydralazine 10 mg IV prn .  Add back oral meds lisinopril 40 as can take PO 4.  Hyperglycemia- blood glucose partially elevated at times.  Monitor am labs, hemoglobin A1c 5.9 5. Hypokalemia-replace orally with Kdur-stable   DVT Prophylaxis-   Lovenox , hold lovenox if surgery planned   Family Communication: patient and daughter, grand-daughter in room  Code Status:   DNR  Disposition Plan: remain in the hospital Looks overall  better Expect d/c later this week dependant on clinical course and graduation off of TF   Consultants:  General surgery  Procedures:  none  Antibiotics:  flagyll 10/9-10/12  Cipro-10/12  10/9  Primaxin 10/12---   Objective: BP 140/68 (BP Location: Left Arm)   Pulse (!) 101   Temp 98.6 F (37 C) (Oral)   Resp 16   Ht 5\' 10"  (1.778 m)   Wt 82.5 kg (181 lb 14.1 oz)   SpO2 100%   BMI 26.10 kg/m   Intake/Output Summary (Last 24 hours) at 04/01/16 1141 Last data filed at 04/01/16 1010  Gross per 24 hour  Intake          2757.83 ml  Output             2700 ml  Net            57.83 ml   Filed Weights   03/30/16 0522 03/31/16 0507 04/01/16 0500  Weight: 81.7 kg (180 lb 1.9 oz) 83 kg (182 lb 15.7 oz) 82.5 kg (181 lb 14.1 oz)    Exam:   General:  Awake alert, less sleepy overall  Cardiovascular: RRR, no m/r/g  Respiratory: CTAB.    Abdomen: +tender right upper quadrant,  positive BS, mild tender in UQ  Musculoskeletal: No Edema  Neuro: aaox3  Data Reviewed: Basic Metabolic Panel:  Recent Labs Lab 03/27/16 0500 03/28/16 0445 03/29/16 0432 03/30/16 0430 03/31/16 0455 04/01/16 0500  NA 136 137 139  --  136 137  K 3.1* 3.8 3.0*  --  3.9 3.9  CL 105 109 109  --  107 105  CO2 23 23 23   --  24 27  GLUCOSE 99 209* 164*  --  197* 203*  BUN 19 27* 20  --  27* 23*  CREATININE 0.61 0.55* 0.48* 0.59* 0.68 0.61  CALCIUM 7.6* 7.8* 7.8*  --  8.0* 8.0*  MG  --  2.2 1.9  --   --   --   PHOS  --  1.6* 1.5*  --   --   --    Liver Function Tests:  Recent Labs Lab 03/27/16 0500 03/28/16 0445 03/29/16 0432 03/31/16 0455 04/01/16 0500  AST 57* 51* 84* 57* 49*  ALT 50 47 58 57 53  ALKPHOS 44 50 91 178* 187*  BILITOT 5.9* 5.0* 5.5* 2.9* 2.3*  PROT 5.4* 5.4* 5.5* 5.5* 5.5*  ALBUMIN 2.7* 2.5* 2.6* 2.4* 2.3*   No results for input(s): LIPASE, AMYLASE in the last 168 hours. No results for input(s): AMMONIA in the last 168 hours. CBC:  Recent Labs Lab  03/27/16 0500 03/28/16 0445 03/29/16 0432 03/31/16 0455 04/01/16 1020  WBC 17.6* 18.0* 16.8* 16.9* 19.0*  NEUTROABS 14.7* 14.3* 13.2*  --  16.1*  HGB 13.4 12.7* 12.6* 12.1* 11.9*  HCT 38.1* 37.6* 36.8* 34.9* 34.1*  MCV 91.1 96.2 94.8 93.6 93.4  PLT 165 170 165 185 197   Cardiac Enzymes:   No results for input(s): CKTOTAL, CKMB, CKMBINDEX, TROPONINI in the last 168 hours. BNP (last 3 results) No results for input(s): BNP in the last 8760 hours.  ProBNP (last 3 results) No results for input(s): PROBNP in the last 8760 hours.  CBG:  Recent Labs Lab 03/30/16 1157 03/30/16 1544 03/30/16 2014 03/31/16 0013 03/31/16 1955  GLUCAP 222* 186* 209* 167* 238*    No results found for this or any previous visit (from the past 240 hour(s)).   Studies: No results found.  Scheduled Meds: . chlorhexidine  15 mL Mouth Rinse BID  . enoxaparin (LOVENOX) injection  40 mg Subcutaneous Daily  . famotidine (PEPCID) IV  20 mg Intravenous Q12H  . feeding supplement (PRO-STAT SUGAR FREE 64)  30 mL Per Tube BID  . free water  50 mL Per Tube Q4H  . imipenem-cilastatin  500 mg Intravenous Q6H  . insulin aspart  0-9 Units Subcutaneous Q4H  . lisinopril  40 mg Oral q morning - 10a  . mouth rinse  15 mL Mouth Rinse q12n4p  . metoprolol  5 mg Intravenous Q6H  . potassium chloride  40 mEq Per Tube Daily  . saccharomyces boulardii  250 mg Oral BID    Continuous Infusions: . feeding supplement (OSMOLITE 1.5 CAL) 1,000 mL (04/01/16 0500)     Time spent: 15 mins    Pleas Koch, MD [facility] [phone]

## 2016-04-01 NOTE — Progress Notes (Signed)
[facility]  Colin West Finau 82 y.o. 19-Jan-1934   Subjective: Reports severe abdominal pain last night that progressed through the night. Denies abdominal pain now. Wants nasal enteric tube removed. No family at bedside.  Objective: Vital signs in last 24 hours: Vitals:   03/31/16 2125 04/01/16 0537  BP: (!) 141/69 140/68  Pulse: (!) 103 (!) 101  Resp: 16 16  Temp: 98.1 F (36.7 C) 98.6 F (37 C)    Physical Exam: Gen: lethargic, elderly, mild acute distress CV: RRR Chest: CTA B Abd: soft, minimal tenderness without guarding, nondistended, +BS Ext: no edema  Lab Results:  Recent Labs  03/31/16 0455 04/01/16 0500  NA 136 137  K 3.9 3.9  CL 107 105  CO2 24 27  GLUCOSE 197* 203*  BUN 27* 23*  CREATININE 0.68 0.61  CALCIUM 8.0* 8.0*    Recent Labs  03/31/16 0455 04/01/16 0500  AST 57* 49*  ALT 57 53  ALKPHOS 178* 187*  BILITOT 2.9* 2.3*  PROT 5.5* 5.5*  ALBUMIN 2.4* 2.3*    Recent Labs  03/31/16 0455  WBC 16.9*  HGB 12.1*  HCT 34.9*  MCV 93.6  PLT 185   No results for input(s): LABPROT, INR in the last 72 hours.    Assessment/Plan: Hemorrhagic pancreatitis on tube feeds and broad-spectrum Abx, Recheck CBC. Would continue nasoenteric tube feeding. Hold off on advancing diet beyond full liquids today due to abdominal pain yesterday and if postprandial pain occurs will need to change to NPO. Supportive care.   Colin Navarrete C. 04/01/2016, 9:16 AM  Pager [phone]  If no answer or after 5 PM call [phone] ID: Colin West, male   DOB: 19-Jan-1934, 82 y.o.   MRN: [id number]

## 2016-04-01 NOTE — Progress Notes (Signed)
[facility] Progress Note     Subjective: Increased pain overnight in epigastrium radiating to back. Ambulating to and from bathroom. +flatus and BMs.  Objective: Vital signs in last 24 hours: Temp:  [98.1 F (36.7 C)-98.6 F (37 C)] 98.6 F (37 C) (10/17 0537) Pulse Rate:  [86-103] 101 (10/17 0537) Resp:  [16-18] 16 (10/17 0537) BP: (140-169)/(68-74) 140/68 (10/17 0537) SpO2:  [95 %-100 %] 100 % (10/17 0537) Weight:  [181 lb 14.1 oz (82.5 kg)] 181 lb 14.1 oz (82.5 kg) (10/17 0500) Last BM Date: 04/01/16  Intake/Output from previous day: 10/16 0701 - 10/17 0700 In: 2957.8 [P.O.:960; NG/GT:1572.8; IV Piggyback:425] Out: 2600 [Urine:2600] Intake/Output this shift: Total I/O In: -  Out: 400 [Urine:400]  PE: Gen:  somnolent, NAD, pleasant Card:  RRR, no M/G/R heard Pulm:  CTA, no W/R/R Abd: Soft, minimal epigastric tenderness, +BS Lab Results:   Recent Labs  03/31/16 0455  WBC 16.9*  HGB 12.1*  HCT 34.9*  PLT 185   BMET  Recent Labs  03/31/16 0455 04/01/16 0500  NA 136 137  K 3.9 3.9  CL 107 105  CO2 24 27  GLUCOSE 197* 203*  BUN 27* 23*  CREATININE 0.68 0.61  CALCIUM 8.0* 8.0*   CMP     Component Value Date/Time   NA 137 04/01/2016 0500   K 3.9 04/01/2016 0500   CL 105 04/01/2016 0500   CO2 27 04/01/2016 0500   GLUCOSE 203 (H) 04/01/2016 0500   BUN 23 (H) 04/01/2016 0500   CREATININE 0.61 04/01/2016 0500   CALCIUM 8.0 (L) 04/01/2016 0500   PROT 5.5 (L) 04/01/2016 0500   ALBUMIN 2.3 (L) 04/01/2016 0500   AST 49 (H) 04/01/2016 0500   ALT 53 04/01/2016 0500   ALKPHOS 187 (H) 04/01/2016 0500   BILITOT 2.3 (H) 04/01/2016 0500   GFRNONAA >60 04/01/2016 0500   GFRAA >60 04/01/2016 0500   Lipase     Component Value Date/Time   LIPASE 45 03/25/2016 0455   Anti-infectives: Anti-infectives    Start     Dose/Rate Route Frequency Ordered Stop   03/27/16 1015  imipenem-cilastatin (PRIMAXIN) 500 mg in sodium chloride 0.9 % 100 mL IVPB      500 mg 200 mL/hr over 30 Minutes Intravenous Every 6 hours 03/27/16 1014     03/24/16 1400  metroNIDAZOLE (FLAGYL) IVPB 500 mg  Status:  Discontinued     500 mg 100 mL/hr over 60 Minutes Intravenous Every 8 hours 03/24/16 1213 03/27/16 0954   03/24/16 1300  ciprofloxacin (CIPRO) IVPB 400 mg  Status:  Discontinued     400 mg 200 mL/hr over 60 Minutes Intravenous Every 12 hours 03/24/16 1213 03/27/16 0954   03/23/16 1115  piperacillin-tazobactam (ZOSYN) IVPB 3.375 g     3.375 g 12.5 mL/hr over 240 Minutes Intravenous  Once 03/23/16 1112 03/23/16 1255     Assessment/Plan Hemorrhagic pancreatitis - tolerating post-pyloric tube feeds and clears - increased pain overnight and continuing into today may warrant backing off diet to NPO if pain persists - benign abdominal exam today - Complete recovery from pancreatitis before considering surgery to remove gallbladder, possible removal of gallbladder in 6 weeks - WBC 16.9 - repeat CBC pending, on broad-spectrum abx - bilirubin trending down, now 2.3   FEN - clears, Post-pyloric TF  Plan - continue supportive treatment of pancreatitis per GI/medicine.  Will likely wait to consider cholecystectomy until patient is completely recovered from pancreatitis, possibly 6 weeks after discharge.  LOS: 8 days    Adam PhenixElizabeth S Simaan , [facility] 04/01/2016, 10:25 AM Pager: [phone] Consults: [phone] Mon-Fri 7:00 am-4:30 pm Sat-Sun 7:00 am-11:30 am

## 2016-04-01 NOTE — Care Management Important Message (Signed)
Important Message  Patient Details  Name: Colin West MRN: [id number] Date of Birth: Feb 08, 1934   Medicare Important Message Given:  Yes    Haskell FlirtJamison, Tyrik Stetzer 04/01/2016, 12:20 PMImportant Message  Patient Details  Name: Colin West MRN: [id number] Date of Birth: Feb 08, 1934   Medicare Important Message Given:  Yes    Haskell FlirtJamison, Emmi Wertheim 04/01/2016, 12:20 PM

## 2016-04-02 DIAGNOSIS — K8020 Calculus of gallbladder without cholecystitis without obstruction: Secondary | ICD-10-CM

## 2016-04-02 DIAGNOSIS — K851 Biliary acute pancreatitis without necrosis or infection: Principal | ICD-10-CM

## 2016-04-02 DIAGNOSIS — I10 Essential (primary) hypertension: Secondary | ICD-10-CM

## 2016-04-02 LAB — URINALYSIS, ROUTINE W REFLEX MICROSCOPIC
Bilirubin Urine: NEGATIVE
GLUCOSE, UA: 500 mg/dL — AB
Hgb urine dipstick: NEGATIVE
KETONES UR: NEGATIVE mg/dL
LEUKOCYTES UA: NEGATIVE
NITRITE: NEGATIVE
PROTEIN: NEGATIVE mg/dL
Specific Gravity, Urine: 1.013 (ref 1.005–1.030)
pH: 7 (ref 5.0–8.0)

## 2016-04-02 MED ORDER — GLUCERNA SHAKE PO LIQD
237.0000 mL | Freq: Three times a day (TID) | ORAL | Status: DC
  Administered 2016-04-02 – 2016-04-07 (×11): 237 mL via ORAL
  Filled 2016-04-02 (×18): qty 237

## 2016-04-02 MED ORDER — RANITIDINE HCL 150 MG/10ML PO SYRP
150.0000 mg | ORAL_SOLUTION | Freq: Two times a day (BID) | ORAL | Status: DC
  Administered 2016-04-02 – 2016-04-05 (×5): 150 mg
  Filled 2016-04-02 (×7): qty 10

## 2016-04-02 MED ORDER — ENSURE ENLIVE PO LIQD: 237.0000 mL | Freq: Two times a day (BID) | ORAL | Status: DC

## 2016-04-02 MED ORDER — METOPROLOL TARTRATE 25 MG PO TABS
25.0000 mg | ORAL_TABLET | Freq: Two times a day (BID) | ORAL | Status: DC
  Administered 2016-04-02 – 2016-04-07 (×10): 25 mg via ORAL
  Filled 2016-04-02 (×11): qty 1

## 2016-04-02 NOTE — Progress Notes (Signed)
Nutrition Follow-up  DOCUMENTATION CODES:   Non-severe (moderate) malnutrition in context of acute illness/injury  INTERVENTION:   Continue Osmolite 1.5 @ goal rate of 55 ml/hr.  30 ml Prostat BID Continue free water of 50 ml every 4 hours (300 ml total) This regimen provides 2180 kcal (100% of needs), 112g protein and 1305 ml H2O.  Provide Glucerna Shake po TID, each supplement provides 220 kcal and 10 grams of protein RD to follow-up 10/19 to assess tolerance, PO intake and ability to decrease tube feeds.  NUTRITION DIAGNOSIS:   Increased nutrient needs related to other (see comment) (acute pancreatitis) as evidenced by estimated needs.  Ongoing.  GOAL:   Patient will meet greater than or equal to 90% of their needs  Meeting with TF.  MONITOR:   Labs, Weight trends, TF tolerance, I & O's  ASSESSMENT:   82 y.o. male, With history of depression, kidney stones, hypertension came to the ED with worsening abdominal pain and vomiting. Patient says that he vomited 4 times last night and has a constant epigastric pain. He denies diarrhea. No fever or chills. No dysuria. No chest pain or shortness of breath.  Pt in room with no family at bedside. Pt states his pain is better and he is not nauseous. Pt states he ate cream of wheat, pudding, OJ and coffee this morning with no issue. He is willing to try protein supplements to see if he can tolerate them. Will order Glucerna shakes given elevated CBGs. Pt still receiving Osmolite 1.5 @ 55 ml/hr with 30 ml Prostat BID. Will assess pt's ability to tolerate PO intake prior to adjusting TFs.  Medications: KCl solution daily, Florastor capsule BID Labs reviewed: CBGs: 238  Diet Order:  Diet full liquid Room service appropriate? Yes; Fluid consistency: Thin  Skin:  Reviewed, no issues  Last BM:  10/17  Height:   Ht Readings from Last 1 Encounters:  03/23/16 5\' 10"  (1.778 m)    Weight:   Wt Readings from Last 1 Encounters:   04/02/16 179 lb 14.3 oz (81.6 kg)    Ideal Body Weight:  75.5 kg  BMI:  Body mass index is 25.81 kg/m.  Estimated Nutritional Needs:   Kcal:  [id number]  Protein:  110-120g  Fluid:  2-2.2L/day  EDUCATION NEEDS:   Education needs addressed  Tilda FrancoLindsey Huy Majid, MS, RD, LDN Pager: [phone] After Hours Pager: [phone]

## 2016-04-02 NOTE — Progress Notes (Signed)
Key Points: Use following P&T approved IV to PO non-antibiotic change policy.  Description contains the criteria that are approved Note: Policy Excludes:  Esophagectomy patientsPHARMACIST - PHYSICIAN COMMUNICATION DR:   Daphine DeutscherMartin CONCERNING: IV to Oral Route Change Policy  RECOMMENDATION: This patient is receiving Pepcid by the intravenous route.  Based on criteria approved by the Pharmacy and Therapeutics Committee, the intravenous medication(s) is/are being converted to the equivalent oral dose form(s).   DESCRIPTION: These criteria include:  The patient is eating (either orally or via tube) and/or has been taking other orally administered medications for a least 24 hours  The patient has no evidence of active gastrointestinal bleeding or impaired GI absorption (gastrectomy, short bowel, patient on TNA or NPO).  If you have questions about this conversion, please contact the Pharmacy Department  []   [phone]( [phone] )  [facility] []   [phone]( [phone] )  [facility]  []   [phone]( [phone] )  [facility] [x]   [phone]( [phone] )  [facility]  Earl ManyLegge, Sabirin Baray WoodbourneMarshall, [facility] State HospitalRPH 04/02/2016 9:50 AM

## 2016-04-02 NOTE — Progress Notes (Addendum)
[facility] PROGRESS NOTE  Farris HasGraham N Tietje WUJ:[id number] DOB: 1934-02-26 DOA: 03/23/2016  PCP: Verl BangsADIONTCHENKO, ALEXEI, MD  Brief History/Interval Summary: 82 year old Caucasian male with a past medical history of hypertension, depression, nephrolithiasis, presented with abdominal pain. Patient was found to have acute pancreatitis. He was also found to have gallstones. Patient was hospitalized for further management. General surgery was consulted. Patient was very slow to improve. He underwent a repeat CT scan which showed progression of pancreatitis and findings suggestive of hemorrhagic pancreatitis.  Reason for Visit: Acute pancreatitis  Consultants: Gastroenterology and general surgery  Procedures: None  Antibiotics: Imipenem 10/12  Previously on Cipro and Flagyl  Subjective/Interval History: Patient states that his pain levels have improved. Denies any nausea, vomiting. Complains of need for frequent urination, which has been ongoing for the past many days. Denies any blood in the urine.  ROS: Denies any chest pain or shortness of breath.  Objective:  Vital Signs  Vitals:   04/01/16 1210 04/01/16 1441 04/01/16 2156 04/02/16 0536  BP: 127/87 (!) 154/77 (!) 141/68 (!) 149/73  Pulse:  100 97 84  Resp:  16 16 16   Temp:  98 F (36.7 C) 98 F (36.7 C) 98 F (36.7 C)  TempSrc:  Oral Oral Oral  SpO2:  98% 97% 98%  Weight:    81.6 kg (179 lb 14.3 oz)  Height:        Intake/Output Summary (Last 24 hours) at 04/02/16 1059 Last data filed at 04/02/16 1009  Gross per 24 hour  Intake              700 ml  Output             1975 ml  Net            -1275 ml   Filed Weights   03/31/16 0507 04/01/16 0500 04/02/16 0536  Weight: 83 kg (182 lb 15.7 oz) 82.5 kg (181 lb 14.1 oz) 81.6 kg (179 lb 14.3 oz)    General appearance: alert, cooperative, appears stated age and no distress Resp: clear to auscultation bilaterally Cardio: regular rate and rhythm, S1, S2 normal, no  murmur, click, rub or gallop GI: Abdomen is soft. Mildly tender diffusely. Bowel sounds are present. No masses or organomegaly. Extremities: extremities normal, atraumatic, no cyanosis or edema Neurologic: Awake and alert. Oriented 3. Focal neurological deficits.  Lab Results:  Data Reviewed: I have personally reviewed following labs and imaging studies  CBC:  Recent Labs Lab 03/27/16 0500 03/28/16 0445 03/29/16 0432 03/31/16 0455 04/01/16 1020  WBC 17.6* 18.0* 16.8* 16.9* 19.0*  NEUTROABS 14.7* 14.3* 13.2*  --  16.1*  HGB 13.4 12.7* 12.6* 12.1* 11.9*  HCT 38.1* 37.6* 36.8* 34.9* 34.1*  MCV 91.1 96.2 94.8 93.6 93.4  PLT 165 170 165 185 197    Basic Metabolic Panel:  Recent Labs Lab 03/27/16 0500 03/28/16 0445 03/29/16 0432 03/30/16 0430 03/31/16 0455 04/01/16 0500  NA 136 137 139  --  136 137  K 3.1* 3.8 3.0*  --  3.9 3.9  CL 105 109 109  --  107 105  CO2 23 23 23   --  24 27  GLUCOSE 99 209* 164*  --  197* 203*  BUN 19 27* 20  --  27* 23*  CREATININE 0.61 0.55* 0.48* 0.59* 0.68 0.61  CALCIUM 7.6* 7.8* 7.8*  --  8.0* 8.0*  MG  --  2.2 1.9  --   --   --   PHOS  --  1.6* 1.5*  --   --   --     GFR: Estimated Creatinine Clearance: 76 mL/min (by C-G formula based on SCr of 0.61 mg/dL).  Liver Function Tests:  Recent Labs Lab 03/27/16 0500 03/28/16 0445 03/29/16 0432 03/31/16 0455 04/01/16 0500  AST 57* 51* 84* 57* 49*  ALT 50 47 58 57 53  ALKPHOS 44 50 91 178* 187*  BILITOT 5.9* 5.0* 5.5* 2.9* 2.3*  PROT 5.4* 5.4* 5.5* 5.5* 5.5*  ALBUMIN 2.7* 2.5* 2.6* 2.4* 2.3*    Coagulation Profile:  Recent Labs Lab 03/27/16 0500  INR 1.23    CBG:  Recent Labs Lab 03/30/16 1157 03/30/16 1544 03/30/16 2014 03/31/16 0013 03/31/16 1955  GLUCAP 222* 186* 209* 167* 238*     Radiology Studies: No results found.   Medications:  Scheduled: . chlorhexidine  15 mL Mouth Rinse BID  . enoxaparin (LOVENOX) injection  40 mg Subcutaneous Daily  .  feeding supplement (PRO-STAT SUGAR FREE 64)  30 mL Per Tube BID  . free water  50 mL Per Tube Q4H  . imipenem-cilastatin  500 mg Intravenous Q6H  . insulin aspart  0-9 Units Subcutaneous Q4H  . lisinopril  40 mg Oral q morning - 10a  . mouth rinse  15 mL Mouth Rinse q12n4p  . metoprolol  5 mg Intravenous Q6H  . potassium chloride  40 mEq Per Tube Daily  . ranitidine  150 mg Per Tube BID  . saccharomyces boulardii  250 mg Oral BID   Continuous: . feeding supplement (OSMOLITE 1.5 CAL) 1,000 mL (04/01/16 0500)   ZOX:WRUEAVWUJWJXB, albuterol, hydrALAZINE, morphine injection, ondansetron (ZOFRAN) IV, sodium chloride flush, traMADol  Assessment/Plan:  Principal Problem:   Acute pancreatitis Active Problems:   Essential hypertension   Abdominal pain   Hyperbilirubinemia   Malnutrition of moderate degree    Acute hemorrhagic pancreatitis Likely precipitated by gallstones. Patient has been slow to recover and did experience setback due to worsening and pancreatitis. Currently on imipenem. Gastroenterology and general surgery is following. Patient was started on 2 feedings via NG tube. On 10/14. He is getting full liquid diets by mouth. Pain level is reasonably well controlled. Appreciate specialty input. Continue to monitor closely for now.   Cholelithiasis with mild transaminitis and hyperbilirubinemia Followed by general surgery. Patient will need cholecystectomy eventually, but due to his severe pancreatitis not to be done for the next several weeks. Bilirubin has improved.   Leukocytosis Likely from dehydration and pancreatitis. Continue Primaxin. Monitor WBC closely. He is afebrile.  Essential Hypertension/sinus tachycardia Monitor blood pressures closely. Continue lisinopril. Change to oral metoprolol. Sinus tachycardia is due to acute illness.  Diabetes mellitus type 2 with hyperglycemia Patient was on metformin at home. CBGs are detected. HbA1c is 5.9. Continue  SSI.  Hypokalemia Repleted  Nephrolithiasis with nonspecific abnormal urinary bladder findings on CT scan. Outpatient follow-up with urology.  DVT Prophylaxis: Lovenox    Code Status: DO NOT RESUSCITATE  Family Communication: Discussed with the patient  Disposition Plan: Continue management as outlined above. Await improvement. Continue to mobilize.     LOS: 9 days   [facility]  [facility] Pager [phone] 04/02/2016, 10:59 AM  If 7PM-7AM, please contact night-coverage at [email or web address], password [facility]

## 2016-04-02 NOTE — Progress Notes (Signed)
[facility] Progress [facility]  Farris HasGraham N Guile 82 y.o. 03/24/1934   Subjective: Feels a lot better today compared with yesterday with less abdominal pain today. Denies N/V. Would like nasoenteric tube removed as soon as possible. Brother in room.  Objective: Vital signs in last 24 hours: Vitals:   04/02/16 0536 04/02/16 1237  BP: (!) 149/73 135/62  Pulse: 84 93  Resp: 16   Temp: 98 F (36.7 C)     Physical Exam: Gen: lethargic, elderly, no acute distress CV: RRR Chest: CTA B Abd: soft, nontender, nondistended, +BS  Lab Results:  Recent Labs  03/31/16 0455 04/01/16 0500  NA 136 137  K 3.9 3.9  CL 107 105  CO2 24 27  GLUCOSE 197* 203*  BUN 27* 23*  CREATININE 0.68 0.61  CALCIUM 8.0* 8.0*    Recent Labs  03/31/16 0455 04/01/16 0500  AST 57* 49*  ALT 57 53  ALKPHOS 178* 187*  BILITOT 2.9* 2.3*  PROT 5.5* 5.5*  ALBUMIN 2.4* 2.3*    Recent Labs  03/31/16 0455 04/01/16 1020  WBC 16.9* 19.0*  NEUTROABS  --  16.1*  HGB 12.1* 11.9*  HCT 34.9* 34.1*  MCV 93.6 93.4  PLT 185 197   No results for input(s): LABPROT, INR in the last 72 hours.    Assessment/Plan: Acute pancreatitis with hemorrhagic appearance without necrosis on CT on 03/26/16. Clinically slowly showing signs of improvement. Doubt he could achieve his caloric needs by mouth therefore would continue nasoenteric feeding. Will do an updated CT scan tomorrow to check for evolution of the pancreatitis and look at the previously seen peripancreatic fluid collection. If pancreatitis improving on CT and continues to do well clinically, then will slowly advance diet and consider removing nasoenteric tube in next 2-3 days. F/U on labs and correct electrolytes if needed. Continue supportive care.   Kamalani Mastro C. 04/02/2016, 1:24 PM  Pager [phone]  If no answer or after 5 PM call [phone] ID: Farris HasGraham N Dusek, male   DOB: 03/24/1934, 82 y.o.   MRN: [id number]

## 2016-04-02 NOTE — Progress Notes (Signed)
Pharmacy Antibiotic Note  Farris HasGraham N Vento is a 82 y.o. male admitted on 03/23/2016 with severe acute pancreatitis secondary to gallstones currently on ciprofloxacin and flagyl therapy. Pharmacy has been consulted to transition antibiotics to Primaxin dosing. NKDA.    10/18: remains afebrile, WBC elevated and slowly trending up, SCr low but stable, CrCl 60N(rounding SCr to 1) and 76 CG  Plan: Day 11 antibiotics. Day 7 Primaxin. Continue 500mg  IV q6h F/u renal function, cultures, clinical course  Height: 5\' 10"  (177.8 cm) Weight: 179 lb 14.3 oz (81.6 kg) IBW/kg (Calculated) : 73   Temp (24hrs), Avg:98 F (36.7 C), Min:98 F (36.7 C), Max:98 F (36.7 C)   Recent Labs Lab 03/27/16 0500 03/28/16 0445 03/29/16 0432 03/30/16 0430 03/31/16 0455 04/01/16 0500 04/01/16 1020  WBC 17.6* 18.0* 16.8*  --  16.9*  --  19.0*  CREATININE 0.61 0.55* 0.48* 0.59* 0.68 0.61  --     Estimated Creatinine Clearance: 76 mL/min (by C-G formula based on SCr of 0.61 mg/dL).    No Known Allergies  Antimicrobials this admission: 10/8 Zosyn x 1  10/9 Ciprofloxacin >> 10/12 10/9 Flagyl >> 10/12 10/12 Primaxin >>   Dose adjustments this admission:  Microbiology results: None   Thank you for allowing pharmacy to be a part of this patient's care.   Hessie KnowsJustin M Dajon Lazar, PharmD, BCPS Pager [phone] 04/02/2016 9:48 AM

## 2016-04-03 ENCOUNTER — Inpatient Hospital Stay (HOSPITAL_COMMUNITY): Payer: Medicare Other

## 2016-04-03 LAB — COMPREHENSIVE METABOLIC PANEL
ALK PHOS: 167 U/L — AB (ref 38–126)
ALT: 56 U/L (ref 17–63)
AST: 51 U/L — AB (ref 15–41)
Albumin: 2.4 g/dL — ABNORMAL LOW (ref 3.5–5.0)
Anion gap: 4 — ABNORMAL LOW (ref 5–15)
BUN: 21 mg/dL — AB (ref 6–20)
CO2: 28 mmol/L (ref 22–32)
CREATININE: 0.69 mg/dL (ref 0.61–1.24)
Calcium: 8.3 mg/dL — ABNORMAL LOW (ref 8.9–10.3)
Chloride: 101 mmol/L (ref 101–111)
GFR calc Af Amer: >60 mL/min (ref >60)
GFR calc non Af Amer: >60 mL/min (ref >60)
GLUCOSE: 137 mg/dL — AB (ref 65–99)
Potassium: 4.1 mmol/L (ref 3.5–5.1)
SODIUM: 133 mmol/L — AB (ref 135–145)
Total Bilirubin: 1.8 mg/dL — ABNORMAL HIGH (ref 0.3–1.2)
Total Protein: 5.5 g/dL — ABNORMAL LOW (ref 6.5–8.1)

## 2016-04-03 LAB — CBC
HCT: 35.9 % — ABNORMAL LOW (ref 39.0–52.0)
HEMOGLOBIN: 11.9 g/dL — AB (ref 13.0–17.0)
MCH: 32.6 pg (ref 26.0–34.0)
MCHC: 33.1 g/dL (ref 30.0–36.0)
MCV: 98.4 fL (ref 78.0–100.0)
PLATELETS: 270 10*3/uL (ref 150–400)
RBC: 3.65 MIL/uL — AB (ref 4.22–5.81)
RDW: 14.7 % (ref 11.5–15.5)
WBC: 20.4 10*3/uL — AB (ref 4.0–10.5)

## 2016-04-03 MED ORDER — CHLORPROMAZINE HCL 25 MG/ML IJ SOLN
25.0000 mg | Freq: Once | INTRAMUSCULAR | Status: AC
Start: 2016-04-03 — End: 2016-04-03
  Administered 2016-04-03: 25 mg via INTRAVENOUS
  Filled 2016-04-03: qty 1

## 2016-04-03 MED ORDER — IOPAMIDOL (ISOVUE-300) INJECTION 61%: 15.0000 mL | Freq: Once | INTRAVENOUS | Status: DC | PRN

## 2016-04-03 MED ORDER — METOCLOPRAMIDE HCL 5 MG/ML IJ SOLN
5.0000 mg | Freq: Three times a day (TID) | INTRAMUSCULAR | Status: AC
  Administered 2016-04-04: 10 mg via INTRAVENOUS
  Administered 2016-04-04: 5 mg via INTRAVENOUS
  Filled 2016-04-03 (×2): qty 2

## 2016-04-03 MED ORDER — IOPAMIDOL (ISOVUE-300) INJECTION 61%
100.0000 mL | Freq: Once | INTRAVENOUS | Status: AC | PRN
  Administered 2016-04-03: 100 mL via INTRAVENOUS

## 2016-04-03 MED ORDER — ACETAMINOPHEN 650 MG RE SUPP: 650.0000 mg | RECTAL | Status: DC | PRN

## 2016-04-03 NOTE — Progress Notes (Signed)
[facility] PROGRESS NOTE  Colin West ZOX:[id number] DOB: [birth year] DOA: 03/23/2016  PCP: [name], MD  Brief History/Interval Summary: 82 year old Caucasian male with a past medical history of hypertension, depression, nephrolithiasis, presented with abdominal pain. Patient was found to have acute pancreatitis. He was also found to have gallstones. Patient was hospitalized for further management. General surgery was consulted. Patient was very slow to improve. He underwent a repeat CT scan which showed progression of pancreatitis and findings suggestive of hemorrhagic pancreatitis.  Reason for Visit: Acute pancreatitis  Consultants: Gastroenterology and general surgery  Procedures: None  Antibiotics: Imipenem 10/12  Previously on Cipro and Flagyl  Subjective/Interval History: Patient states that he is feeling better. Abdominal pain has significantly improved. He denies any nausea or vomiting.   ROS: Denies any chest pain or shortness of breath.  Objective:  Vital Signs  Vitals:   04/02/16 1237 04/02/16 2111 04/03/16 0500 04/03/16 0525  BP: 135/62 (!) 152/77  (!) 129/56  Pulse: 93 91  75  Resp:  16  16  Temp:  98.6 F (37 C)  98.2 F (36.8 C)  TempSrc:  Oral  Oral  SpO2:  97%  100%  Weight:   81.2 kg (179 lb 0.2 oz)   Height:        Intake/Output Summary (Last 24 hours) at 04/03/16 1004 Last data filed at 04/03/16 0900  Gross per 24 hour  Intake           524.83 ml  Output             1575 ml  Net         -1050.17 ml   Filed Weights   04/01/16 0500 04/02/16 0536 04/03/16 0500  Weight: 82.5 kg (181 lb 14.1 oz) 81.6 kg (179 lb 14.3 oz) 81.2 kg (179 lb 0.2 oz)    General appearance: alert, cooperative, appears stated age and no distress Resp: clear to auscultation bilaterally Cardio: regular rate and rhythm, S1, S2 normal, no murmur, click, rub or gallop GI: Abdomen is soft. Mildly tender diffusely. Bowel sounds are present. No masses  or organomegaly. Neurologic: Awake and alert. Oriented 3. Focal neurological deficits.  Lab Results:  Data Reviewed: I have personally reviewed following labs and imaging studies  CBC:  Recent Labs Lab 03/28/16 0445 03/29/16 0432 03/31/16 0455 04/01/16 1020 04/03/16 0455  WBC 18.0* 16.8* 16.9* 19.0* 20.4*  NEUTROABS 14.3* 13.2*  --  16.1*  --   HGB 12.7* 12.6* 12.1* 11.9* 11.9*  HCT 37.6* 36.8* 34.9* 34.1* 35.9*  MCV 96.2 94.8 93.6 93.4 98.4  PLT 170 165 185 197 270    Basic Metabolic Panel:  Recent Labs Lab 03/28/16 0445 03/29/16 0432 03/30/16 0430 03/31/16 0455 04/01/16 0500 04/03/16 0850  NA 137 139  --  136 137 133*  K 3.8 3.0*  --  3.9 3.9 4.1  CL 109 109  --  107 105 101  CO2 23 23  --  24 27 28   GLUCOSE 209* 164*  --  197* 203* 137*  BUN 27* 20  --  27* 23* 21*  CREATININE 0.55* 0.48* 0.59* 0.68 0.61 0.69  CALCIUM 7.8* 7.8*  --  8.0* 8.0* 8.3*  MG 2.2 1.9  --   --   --   --   PHOS 1.6* 1.5*  --   --   --   --     GFR: Estimated Creatinine Clearance: 76 mL/min (by C-G formula based on SCr of 0.69  mg/dL).  Liver Function Tests:  Recent Labs Lab 03/28/16 0445 03/29/16 0432 03/31/16 0455 04/01/16 0500 04/03/16 0850  AST 51* 84* 57* 49* 51*  ALT 47 58 57 53 56  ALKPHOS 50 91 178* 187* 167*  BILITOT 5.0* 5.5* 2.9* 2.3* 1.8*  PROT 5.4* 5.5* 5.5* 5.5* 5.5*  ALBUMIN 2.5* 2.6* 2.4* 2.3* 2.4*    Coagulation Profile: No results for input(s): INR, PROTIME in the last 168 hours.  CBG:  Recent Labs Lab 03/30/16 1157 03/30/16 1544 03/30/16 2014 03/31/16 0013 03/31/16 1955  GLUCAP 222* 186* 209* 167* 238*     Radiology Studies: No results found.   Medications:  Scheduled: . chlorhexidine  15 mL Mouth Rinse BID  . enoxaparin (LOVENOX) injection  40 mg Subcutaneous Daily  . feeding supplement (GLUCERNA SHAKE)  237 mL Oral TID BM  . feeding supplement (PRO-STAT SUGAR FREE 64)  30 mL Per Tube BID  . free water  50 mL Per Tube Q4H  .  imipenem-cilastatin  500 mg Intravenous Q6H  . insulin aspart  0-9 Units Subcutaneous Q4H  . lisinopril  40 mg Oral q morning - 10a  . mouth rinse  15 mL Mouth Rinse q12n4p  . metoprolol tartrate  25 mg Oral BID  . potassium chloride  40 mEq Per Tube Daily  . ranitidine  150 mg Per Tube BID  . saccharomyces boulardii  250 mg Oral BID   Continuous: . feeding supplement (OSMOLITE 1.5 CAL) 1,000 mL (04/02/16 2315)   ZOX:WRUEAVWUJWJXBPRN:acetaminophen, albuterol, hydrALAZINE, iopamidol, morphine injection, ondansetron (ZOFRAN) IV, sodium chloride flush, traMADol  Assessment/Plan:  Principal Problem:   Acute pancreatitis Active Problems:   Essential hypertension   Abdominal pain   Hyperbilirubinemia   Malnutrition of moderate degree   Cholelithiasis    Acute hemorrhagic pancreatitis Likely precipitated by gallstones. Patient has been slow to recover and did experience setback due to worsening Hemorrhagic pancreatitis. Currently on imipenem. Gastroenterology and general surgery is following. Patient was started on 2 feedings via NG tube on 10/14. He is getting full liquid diets by mouth. Pain level is reasonably well controlled. Appreciate specialty input. Continue to monitor closely for now. Plan is to repeat CT scan of the abdomen and pelvis today. UA did not show any infection.  Cholelithiasis with mild transaminitis and hyperbilirubinemia Followed by general surgery. Patient will need cholecystectomy eventually, but due to his severe pancreatitis not to be done for the next several weeks. Bilirubin has improved.   Leukocytosis Likely from severe pancreatitis. Continue Primaxin. Monitor WBC closely. He is afebrile.  Essential Hypertension/sinus tachycardia Monitor blood pressures closely. Continue lisinopril. Change to oral metoprolol. Sinus tachycardia is due to acute illness.  Diabetes mellitus type 2 with hyperglycemia Patient was on metformin at home. Continue to monitor CBGs. HbA1c is 5.9.  Continue SSI.  Hypokalemia Repleted  Nephrolithiasis with nonspecific abnormal urinary bladder findings on CT scan. Outpatient follow-up with urology. UA did not show any blood.  DVT Prophylaxis: Lovenox    Code Status: DO NOT RESUSCITATE  Family Communication: Discussed with the patient  Disposition Plan: Continue management as outlined above. Continue to mobilize.     LOS: 10 days   [facility] Center[facility]ISHNAN,Sigismund Cross  [facility] Pager [phone] 04/03/2016, 10:04 AM  If 7PM-7AM, please contact night-coverage at [email or web address], password [email or web address]

## 2016-04-03 NOTE — Progress Notes (Signed)
OT Cancellation Note  Patient Details Name: Colin West N Mccaster MRN: [id number] DOB: May 22, 1934   Cancelled Treatment:    Reason Eval/Treat Not Completed: OT screened, no needs identified, will sign off  [place] Cilento A 04/03/2016, 2:16 PM

## 2016-04-03 NOTE — Evaluation (Signed)
Physical Therapy Evaluation Patient Details Name: Colin West MRN: [id number] DOB: 02-03-1934 Today's Date: 04/03/2016   History of Present Illness  82 yo male admitted with pancreatitis. G tub placement 10/14. Hx of HTn, DM  Clinical Impression  On eval, pt was Min guard assist for mobility. He walked ~500 feet with a RW. Pt tolerated activity well. Will follow to continue to assess mobility and d/c needs.     Follow Up Recommendations Home health PT;Supervision for mobility/OOB    Equipment Recommendations  Rolling walker with 5" wheels    Recommendations for Other Services OT consult     Precautions / Restrictions Precautions Precautions: Fall Precaution Comments: multiple lines. NG tube. Restrictions Weight Bearing Restrictions: No      Mobility  Bed Mobility               General bed mobility comments: oob in recliner  Transfers Overall transfer level: Needs assistance Equipment used: Rolling walker (2 wheeled) Transfers: Sit to/from Stand Sit to Stand: Min guard         General transfer comment: close guard for safety.   Ambulation/Gait Ambulation/Gait assistance: Min guard Ambulation Distance (Feet): 500 Feet Assistive device: Rolling walker (2 wheeled) Gait Pattern/deviations: Step-through pattern;Decreased stride length;Trunk flexed     General Gait Details: close guard for safety.   Stairs            Wheelchair Mobility    Modified Rankin (Stroke Patients Only)       Balance Overall balance assessment: Needs assistance           Standing balance-Leahy Scale: Poor                               Pertinent Vitals/Pain Pain Assessment: No/denies pain    Home Living Family/patient expects to be discharged to:: Private residence Living Arrangements: Alone Available Help at Discharge: Family Type of Home: House       Home Layout: One level Home Equipment: None      Prior Function Level of  Independence: Independent               Hand Dominance        Extremity/Trunk Assessment   Upper Extremity Assessment: Generalized weakness                  Cervical / Trunk Assessment: Kyphotic  Communication   Communication: No difficulties  Cognition Arousal/Alertness: Awake/alert Behavior During Therapy: WFL for tasks assessed/performed Overall Cognitive Status: Within Functional Limits for tasks assessed                      General Comments      Exercises     Assessment/Plan    PT Assessment Patient needs continued PT services  PT Problem List Decreased mobility;Decreased strength;Decreased balance;Decreased knowledge of use of DME          PT Treatment Interventions DME instruction;Gait training;Therapeutic activities;Therapeutic exercise;Patient/family education;Functional mobility training;Balance training    PT Goals (Current goals can be found in the Care Plan section)  Acute Rehab PT Goals Patient Stated Goal: none stated PT Goal Formulation: With patient/family Time For Goal Achievement: 04/17/16 Potential to Achieve Goals: Good    Frequency Min 3X/week   Barriers to discharge        Co-evaluation               End of Session  Activity Tolerance: Patient tolerated treatment well Patient left: in chair;with call bell/phone within reach;with family/visitor present           Time: 1115-1130 PT Time Calculation (min) (ACUTE ONLY): 15 min   Charges:   PT Evaluation $PT Eval Low Complexity: 1 Procedure     PT G Codes:        Rebeca Alert, MPT Pager: [phone]

## 2016-04-03 NOTE — Progress Notes (Signed)
[facility] Progress [facility]  Colin West Quade 82 y.o. Oct 22, 1933   Subjective: Feels ok. Denies abdominal pain. Sitting in bedside chair. Brother in room.  Objective: Vital signs in last 24 hours: Vitals:   04/02/16 2111 04/03/16 0525  BP: (!) 152/77 (!) 129/56  Pulse: 91 75  Resp: 16 16  Temp: 98.6 F (37 C) 98.2 F (36.8 C)    Physical Exam: Gen: lethargic, elderly, no acute distress CV: RRR Chest: CTA B Abd: soft, nontender, nondistended, +BS  Lab Results:  Recent Labs  04/01/16 0500 04/03/16 0850  NA 137 133*  K 3.9 4.1  CL 105 101  CO2 27 28  GLUCOSE 203* 137*  BUN 23* 21*  CREATININE 0.61 0.69  CALCIUM 8.0* 8.3*    Recent Labs  04/01/16 0500 04/03/16 0850  AST 49* 51*  ALT 53 56  ALKPHOS 187* 167*  BILITOT 2.3* 1.8*  PROT 5.5* 5.5*  ALBUMIN 2.3* 2.4*    Recent Labs  04/01/16 1020 04/03/16 0455  WBC 19.0* 20.4*  NEUTROABS 16.1*  --   HGB 11.9* 11.9*  HCT 34.1* 35.9*  MCV 93.4 98.4  PLT 197 270   No results for input(s): LABPROT, INR in the last 72 hours.    Assessment/Plan: Acute pancreatitis (presumed hemorrhagic based on previous CT scan) - on nasoenteric TFs. Leukocytosis. On Primaxin. Await updated CT scan to evaluate evolution of pancreatitis. If pancreatitis improving on CT, then advance diet and hopefully can d/c nasoenteric tube in the next 1-2 days. Supportive care. Will follow.   Ardene Remley C. 04/03/2016, 12:09 PM  Pager [phone]  If no answer or after 5 PM call [phone] ID: Colin West Yandow, male   DOB: Oct 22, 1933, 82 y.o.   MRN: [id number]

## 2016-04-03 NOTE — Progress Notes (Signed)
Nutrition Follow-up  DOCUMENTATION CODES:   Non-severe (moderate) malnutrition in context of acute illness/injury  INTERVENTION:   -Diet advancement per MD  -Continue Osmolite 1.5 @ goal rate of 55 ml/hr.  -30 ml Prostat BID -Continue free water of 50 ml every 4 hours (300 ml total) -This regimen provides 2180 kcal (100% of needs), 112g protein and 1305 ml H2O.  -Upon diet advancement: Provide Glucerna Shake po TID, each supplement provides 220 kcal and 10 grams of protein  RD to follow-up 10/20 for plan  NUTRITION DIAGNOSIS:   Increased nutrient needs related to other (see comment) (acute pancreatitis) as evidenced by estimated needs.  Ongoing.  GOAL:   Patient will meet greater than or equal to 90% of their needs  Meeting with TF.  MONITOR:   Labs, Weight trends, TF tolerance, I & O's  ASSESSMENT:   82 y.o. male, With history of depression, kidney stones, hypertension came to the ED with worsening abdominal pain and vomiting. Patient says that he vomited 4 times last night and has a constant epigastric pain. He denies diarrhea. No fever or chills. No dysuria. No chest pain or shortness of breath.  Unable to see patient at this time. Per nurse tech, staff is "cleaning him up".   Per GI note, pt scheduled to have CT today to check status of pancreatitis. If improved pt will have diet advanced and possible weaning off TFs.   Recommend continue Glucerna shakes upon diet advancement. Will monitor plan and tolerance of tube feeds.  Medications: KCl solution daily, Florastor capsule BID Labs reviewed: Low Na Glucose 137-trending down  Diet Order:  Diet NPO time specified  Skin:  Reviewed, no issues  Last BM:  10/19  Height:   Ht Readings from Last 1 Encounters:  03/23/16 5\' 10"  (1.778 m)    Weight:   Wt Readings from Last 1 Encounters:  04/03/16 179 lb 0.2 oz (81.2 kg)    Ideal Body Weight:  75.5 kg  BMI:  Body mass index is 25.69 kg/m.  Estimated  Nutritional Needs:   Kcal:  [id number]  Protein:  110-120g  Fluid:  2-2.2L/day  EDUCATION NEEDS:   Education needs addressed  Colin FrancoLindsey Matti Killingsworth, MS, RD, LDN Pager: [phone] After Hours Pager: [phone]

## 2016-04-03 NOTE — Progress Notes (Signed)
Progress Note  Pain improving, denies N/V. Working with therapies. Having repeat CT scan today.   General surgery will follow results of CT scan but, due to severity of pancreatitis, our recommendation is to hold off on any surgical intervention this admission, allow time for resolution of pain and inflammation, and see the patient in the outpatient setting in 4-6 weeks for surgical consultation regarding cholecystectomy.   Hosie SpangleElizabeth Simaan, PA-C [facility] Pager: [phone] Consults: [phone] Mon-Fri 7:00 am-4:30 pm Sat-Sun 7:00 am-11:30 am

## 2016-04-04 LAB — COMPREHENSIVE METABOLIC PANEL
ALBUMIN: 2.4 g/dL — AB (ref 3.5–5.0)
ALT: 58 U/L (ref 17–63)
ANION GAP: 6 (ref 5–15)
AST: 53 U/L — ABNORMAL HIGH (ref 15–41)
Alkaline Phosphatase: 155 U/L — ABNORMAL HIGH (ref 38–126)
BUN: 19 mg/dL (ref 6–20)
CHLORIDE: 101 mmol/L (ref 101–111)
CO2: 26 mmol/L (ref 22–32)
Calcium: 8.2 mg/dL — ABNORMAL LOW (ref 8.9–10.3)
Creatinine, Ser: 0.7 mg/dL (ref 0.61–1.24)
GFR calc non Af Amer: >60 mL/min (ref >60)
GLUCOSE: 175 mg/dL — AB (ref 65–99)
POTASSIUM: 4 mmol/L (ref 3.5–5.1)
SODIUM: 133 mmol/L — AB (ref 135–145)
Total Bilirubin: 1.8 mg/dL — ABNORMAL HIGH (ref 0.3–1.2)
Total Protein: 5.6 g/dL — ABNORMAL LOW (ref 6.5–8.1)

## 2016-04-04 LAB — CBC
HCT: 34.7 % — ABNORMAL LOW (ref 39.0–52.0)
HEMOGLOBIN: 11.6 g/dL — AB (ref 13.0–17.0)
MCH: 32.6 pg (ref 26.0–34.0)
MCHC: 33.4 g/dL (ref 30.0–36.0)
MCV: 97.5 fL (ref 78.0–100.0)
PLATELETS: 266 10*3/uL (ref 150–400)
RBC: 3.56 MIL/uL — ABNORMAL LOW (ref 4.22–5.81)
RDW: 14.5 % (ref 11.5–15.5)
WBC: 16.1 10*3/uL — ABNORMAL HIGH (ref 4.0–10.5)

## 2016-04-04 LAB — GLUCOSE, CAPILLARY: Glucose-Capillary: 178 mg/dL — ABNORMAL HIGH (ref 65–99)

## 2016-04-04 NOTE — Progress Notes (Signed)
[facility] PROGRESS NOTE  Colin West ZOX:[id number] DOB: Aug 20, 1933 DOA: 03/23/2016  PCP: [name], MD  Brief History/Interval Summary: 82 year old Caucasian male with a past medical history of hypertension, depression, nephrolithiasis, presented with abdominal pain. Patient was found to have acute pancreatitis. He was also found to have gallstones. Patient was hospitalized for further management. General surgery was consulted. Patient was very slow to improve. He underwent a repeat CT scan which showed progression of pancreatitis and findings suggestive of hemorrhagic pancreatitis.  Reason for Visit: Acute pancreatitis  Consultants: Gastroenterology and general surgery  Procedures: None  Antibiotics: Imipenem 10/12  Previously on Cipro and Flagyl  Subjective/Interval History: Patient continues to feel well. Abdominal pain has improved. He denies any nausea or vomiting. Asking about having his NG-tube removed.   ROS: Denies any chest pain or shortness of breath.  Objective:  Vital Signs  Vitals:   04/03/16 0525 04/03/16 1411 04/03/16 2115 04/04/16 0542  BP: (!) 129/56 120/81 132/71 (!) 128/58  Pulse: 75 92 86 93  Resp: 16 15 16 16   Temp: 98.2 F (36.8 C) 97.8 F (36.6 C) 98.9 F (37.2 C) 97.6 F (36.4 C)  TempSrc: Oral Oral Oral Oral  SpO2: 100% 99% 96% 98%  Weight:    77.6 kg (171 lb 1.2 oz)  Height:        Intake/Output Summary (Last 24 hours) at 04/04/16 8119 Last data filed at 04/04/16 0600  Gross per 24 hour  Intake              100 ml  Output             1700 ml  Net            -1600 ml   Filed Weights   04/02/16 0536 04/03/16 0500 04/04/16 0542  Weight: 81.6 kg (179 lb 14.3 oz) 81.2 kg (179 lb 0.2 oz) 77.6 kg (171 lb 1.2 oz)    General appearance: alert, cooperative, appears stated age and no distress Resp: clear to auscultation bilaterally Cardio: regular rate and rhythm, S1, S2 normal, no murmur, click, rub or gallop GI:  Abdomen is soft. Nontender. Bowel sounds are present. No masses or organomegaly. Neurologic: Awake and alert. Oriented 3. Focal neurological deficits.  Lab Results:  Data Reviewed: I have personally reviewed following labs and imaging studies  CBC:  Recent Labs Lab 03/29/16 0432 03/31/16 0455 04/01/16 1020 04/03/16 0455 04/04/16 0400  WBC 16.8* 16.9* 19.0* 20.4* 16.1*  NEUTROABS 13.2*  --  16.1*  --   --   HGB 12.6* 12.1* 11.9* 11.9* 11.6*  HCT 36.8* 34.9* 34.1* 35.9* 34.7*  MCV 94.8 93.6 93.4 98.4 97.5  PLT 165 185 197 270 266    Basic Metabolic Panel:  Recent Labs Lab 03/29/16 0432 03/30/16 0430 03/31/16 0455 04/01/16 0500 04/03/16 0850 04/04/16 0400  NA 139  --  136 137 133* 133*  K 3.0*  --  3.9 3.9 4.1 4.0  CL 109  --  107 105 101 101  CO2 23  --  24 27 28 26   GLUCOSE 164*  --  197* 203* 137* 175*  BUN 20  --  27* 23* 21* 19  CREATININE 0.48* 0.59* 0.68 0.61 0.69 0.70  CALCIUM 7.8*  --  8.0* 8.0* 8.3* 8.2*  MG 1.9  --   --   --   --   --   PHOS 1.5*  --   --   --   --   --  GFR: Estimated Creatinine Clearance: 76 mL/min (by C-G formula based on SCr of 0.7 mg/dL).  Liver Function Tests:  Recent Labs Lab 03/29/16 0432 03/31/16 0455 04/01/16 0500 04/03/16 0850 04/04/16 0400  AST 84* 57* 49* 51* 53*  ALT 58 57 53 56 58  ALKPHOS 91 178* 187* 167* 155*  BILITOT 5.5* 2.9* 2.3* 1.8* 1.8*  PROT 5.5* 5.5* 5.5* 5.5* 5.6*  ALBUMIN 2.6* 2.4* 2.3* 2.4* 2.4*    CBG:  Recent Labs Lab 03/30/16 1544 03/30/16 2014 03/31/16 0013 03/31/16 1955 04/04/16 0421  GLUCAP 186* 209* 167* 238* 178*     Radiology Studies: Ct Abdomen Pelvis W Contrast  Result Date: 04/03/2016 CLINICAL DATA:  Pancreatitis. EXAM: CT ABDOMEN AND PELVIS WITH CONTRAST TECHNIQUE: Multidetector CT imaging of the abdomen and pelvis was performed using the standard protocol following bolus administration of intravenous contrast. CONTRAST:  100mL ISOVUE-300 IOPAMIDOL (ISOVUE-300)  INJECTION 61% COMPARISON:  03/26/2016 FINDINGS: Lower chest:  Stable areas of scarring right base. Hepatobiliary: The liver shows diffusely decreased attenuation suggesting steatosis. Gallstones again noted. No intrahepatic or extrahepatic biliary dilation. Pancreas: Interval slight progression of diffuse peripancreatic edema/inflammation. Pancreas enhances throughout with no overt findings of pancreatic necrosis. No evidence for organized pseudocyst or rim enhancing abscess. Spleen: No splenomegaly. No focal mass lesion. Adrenals/Urinary Tract: No adrenal nodule or mass. No evidence for enhancing lesion in either kidney. No evidence for hydroureter. Left UVJ stone is unchanged. The urinary bladder appears normal for the degree of distention. Stomach/Bowel: Stomach is distended with fluid. A feeding tube tip is positioned in the transverse duodenum. Duodenum is mildly distended. No small bowel wall thickening. No small bowel dilatation. The terminal ileum is normal. The appendix is normal. Diverticuli are seen scattered along the entire length of the colon without CT findings of diverticulitis. Vascular/Lymphatic: There is abdominal aortic atherosclerosis without aneurysm. As before, there is marked mass-effect/attenuation of the superior mesenteric vein just proximal to the porta splenic confluence. Mass-effect on the splenic vein has increased in the interval although the vessel does remain patent. No evidence for pseudoaneurysm. Celiac axis and SMA opacified. There is no gastrohepatic or hepatoduodenal ligament lymphadenopathy. No intraperitoneal or retroperitoneal lymphadenopathy. No pelvic sidewall lymphadenopathy. Reproductive: Prostate gland is enlarged. Other: Trace intraperitoneal free fluid noted in the pelvis. Musculoskeletal: Bone windows reveal no worrisome lytic or sclerotic osseous lesions. IMPRESSION: 1. Slight interval progression of diffuse peripancreatic edema/inflammation. No evidence for  pancreatic necrosis. No organized pseudocyst or rim enhancing fluid collection to suggest abscess. 2. Mass-effect on the superior mesenteric vein and splenic vein, slightly progressed in the interval although both vessels remain patent. 3. Interval decrease in intraperitoneal free fluid. 4. Cholelithiasis. 5. Stable appearance left UVJ stone without left hydroureteronephrosis. 6. Hepatic steatosis. 7. Interval resolution of left pleural effusion with persistent tiny right pleural effusion. 8. Abdominal aortic atherosclerosis. Electronically Signed   By: Kennith CenterEric  Mansell M.D.   On: 04/03/2016 17:05     Medications:  Scheduled: . chlorhexidine  15 mL Mouth Rinse BID  . enoxaparin (LOVENOX) injection  40 mg Subcutaneous Daily  . feeding supplement (GLUCERNA SHAKE)  237 mL Oral TID BM  . feeding supplement (PRO-STAT SUGAR FREE 64)  30 mL Per Tube BID  . free water  50 mL Per Tube Q4H  . imipenem-cilastatin  500 mg Intravenous Q6H  . insulin aspart  0-9 Units Subcutaneous Q4H  . lisinopril  40 mg Oral q morning - 10a  . mouth rinse  15 mL Mouth Rinse q12n4p  .  metoCLOPramide (REGLAN) injection  5 mg Intravenous Q8H  . metoprolol tartrate  25 mg Oral BID  . potassium chloride  40 mEq Per Tube Daily  . ranitidine  150 mg Per Tube BID  . saccharomyces boulardii  250 mg Oral BID   Continuous: . feeding supplement (OSMOLITE 1.5 CAL) 1,000 mL (04/03/16 1900)   ZOX:[id number], albuterol, hydrALAZINE, iopamidol, morphine injection, ondansetron (ZOFRAN) IV, sodium chloride flush, traMADol  Assessment/Plan:  Principal Problem:   Acute pancreatitis Active Problems:   Essential hypertension   Abdominal pain   Hyperbilirubinemia   Malnutrition of moderate degree   Cholelithiasis    Acute hemorrhagic pancreatitis Likely precipitated by gallstones. Patient has been slow to recover and did experience setback due to worsening Hemorrhagic pancreatitis. Currently on imipenem. Gastroenterology and  general surgery is following. Patient was started on feedings via NG tube on 10/14. Patient has clinically improved. CT scan was repeated yesterday with more or less stable findings. GI to address this today.   Cholelithiasis with mild transaminitis and hyperbilirubinemia Followed by general surgery. Patient will need cholecystectomy eventually, but due to his severe pancreatitis not to be done for the next several weeks. Bilirubin has improved.   Leukocytosis Likely from severe pancreatitis. CBC has improved. Patient remains on Primaxin. Monitor WBC closely. He is afebrile.  Essential Hypertension/sinus tachycardia Monitor blood pressures closely. Continue lisinopril. Change to oral metoprolol. Sinus tachycardia is due to acute illness.  Diabetes mellitus type 2 with hyperglycemia Patient was on metformin at home. Continue to monitor CBGs. HbA1c is 5.9. Continue SSI.  Hypokalemia Repleted  Nephrolithiasis with nonspecific abnormal urinary bladder findings on CT scan. Outpatient follow-up with urology. UA did not show any blood.  DVT Prophylaxis: Lovenox    Code Status: DO NOT RESUSCITATE  Family Communication: Discussed with the patient  Disposition Plan: Continue management as outlined above. Continue to mobilize.     LOS: 11 days   [facility]  [facility] Pager [phone] 04/04/2016, 8:28 AM  If 7PM-7AM, please contact night-coverage at [email or web address], password [facility]

## 2016-04-04 NOTE — Progress Notes (Addendum)
[facility]  Colin West 82 y.o. Mar 20, 1934   Subjective: Feels ok. Denies abdominal pain. Wants to eat and have nasoenteric tube removed. Nurse at bedside.  Objective: Vital signs in last 24 hours: Vitals:   04/03/16 2115 04/04/16 0542  BP: 132/71 (!) 128/58  Pulse: 86 93  Resp: 16 16  Temp: 98.9 F (37.2 C) 97.6 F (36.4 C)    Physical Exam: Gen: lethargic, elderly, no acute distress HEENT: anicteric sclera CV: RRR Chest: CTA B Abd: soft, nontender, nondistended, +BS  Lab Results:  Recent Labs  04/03/16 0850 04/04/16 0400  NA 133* 133*  K 4.1 4.0  CL 101 101  CO2 28 26  GLUCOSE 137* 175*  BUN 21* 19  CREATININE 0.69 0.70  CALCIUM 8.3* 8.2*    Recent Labs  04/03/16 0850 04/04/16 0400  AST 51* 53*  ALT 56 58  ALKPHOS 167* 155*  BILITOT 1.8* 1.8*  PROT 5.5* 5.6*  ALBUMIN 2.4* 2.4*    Recent Labs  04/01/16 1020 04/03/16 0455 04/04/16 0400  WBC 19.0* 20.4* 16.1*  NEUTROABS 16.1*  --   --   HGB 11.9* 11.9* 11.6*  HCT 34.1* 35.9* 34.7*  MCV 93.4 98.4 97.5  PLT 197 270 266   No results for input(s): LABPROT, INR in the last 72 hours.    Assessment/Plan: Severe acute pancreatitis with updated CT negative for pancreatic necrosis and "slight interval progression" in diffuse peripancreatic edema/inflammation. No pseudocyst seen. Clinically he is improving and will slowly advance his diet with goal of discontinuing the nasoenteric tube tomorrow if he does ok with POs. Appreciate nutrition assistance with his caloric needs. Start full liquid and advance as tolerated to low fat diet. WBC decreasing and no evidence of pancreatic necrosis. Will d/c Imipenem. Continue supportive care. Dr. Ewing SchleinMagod to f/u tomorrow.   Colin West C. 04/04/2016, 10:18 AM  Pager [phone]  If no answer or after 5 PM call [phone] ID: Colin West, male   DOB: Mar 20, 1934, 82 y.o.   MRN: [id number]

## 2016-04-04 NOTE — Progress Notes (Signed)
Nutrition Follow-up  DOCUMENTATION CODES:   Non-severe (moderate) malnutrition in context of acute illness/injury  INTERVENTION:  Advance diet per MD. Discussed with Dr. Bosie ClosSchooler that plan will be to advance to FLD today, and then to low fat diet. Hopeful to remove nasoduodenal tube over the weekend when appropriate since it is making patient uncomfortable.  Continue Osmolite 1.5 @ goal rate of 55 ml/hr + Pro-Stat 30 ml BID. Continue free water of 50 ml every 4 hours (300 ml total). Goal regimen provides 2810 kcal (100% needs), 112 grams protein (100% needs), and 1305 ml H2O daily.  Due to severity of pancreatitis, would recommend continuing TF until patient able to meet at least 60% calorie and protein needs through PO diet. Can titrate TF down as diet advanced. RD will continue to monitor and assess for nutrition-related needs.   Upon diet advancement, provide Glucerna Shake po TID, each supplement provides 220 kcal and 10 grams of protein.  NUTRITION DIAGNOSIS:   Increased nutrient needs related to other (see comment) (acute pancreatitis) as evidenced by estimated needs.  Ongoing.  GOAL:   Patient will meet greater than or equal to 90% of their needs  Meeting with TF.  MONITOR:   Labs, Weight trends, TF tolerance, I & O's  REASON FOR ASSESSMENT:   Consult New TPN/TNA ( After discussion with surgery, plan for longer nasojejunal tube today rather than TPN)  ASSESSMENT:   82 y.o. male, With history of depression, kidney stones, hypertension came to the ED with worsening abdominal pain and vomiting. Patient says that he vomited 4 times last night and has a constant epigastric pain. He denies diarrhea. No fever or chills. No dysuria. No chest pain or shortness of breath.  -Patient had repeat CT scan yesterday. Findings per chart were slight interval progression of diffuse peripancreatic edema/inflammation, mass-effect on the superior mesenteric vein and spleinic vein (slightly  progressed in the interval although both vessels remain patent), interval decrease in intraperitoneal free fluid, cholelithiasis, abdominal aortic atherosclerosis.  -Per CT abdomen taken yesterday, feeding tube tip terminates in the transverse duodenum.  Patient reports he is ready for the feeding tube to come out because it is "bothering his stomach." Explained to patient that it is important for him to get adequate calories and protein now and since he is NPO that is the only way he can receive it. Denies N/V, abdominal pain, or abdominal distention. Patient is having hiccups, but per RN he has had these on-and-off since admission.  Osmolite 1.5 running at 55 ml/hr.  Medications reviewed and include: Novolog sliding scale Q4hrs, potassium chloride 40 mEq daily, ranitidine.  Labs reviewed: CBG 178, Sodium 133, Alkaline Phosphatase 155, AST 53, Total Bilirubin 1.8.  Weight trend: current weight 77.6 kg (+0.5 kg from admission)  Discussed with RN.  Diet Order:  Diet full liquid Room service appropriate? Yes; Fluid consistency: Thin  Skin:  Reviewed, no issues  Last BM:  10/19  Height:   Ht Readings from Last 1 Encounters:  03/23/16 5\' 10"  (1.778 m)    Weight:   Wt Readings from Last 1 Encounters:  04/04/16 171 lb 1.2 oz (77.6 kg)    Ideal Body Weight:  75.5 kg  BMI:  Body mass index is 24.55 kg/m.  Estimated Nutritional Needs:   Kcal:  [id number]  Protein:  110-120g  Fluid:  2-2.2L/day  EDUCATION NEEDS:   Education needs addressed  Helane RimaLeanne Toddrick Sanna, MS, RD, LDN Pager: [phone] After Hours Pager: [phone]

## 2016-04-05 LAB — BASIC METABOLIC PANEL
Anion gap: 4 — ABNORMAL LOW (ref 5–15)
BUN: 21 mg/dL — ABNORMAL HIGH (ref 6–20)
CHLORIDE: 102 mmol/L (ref 101–111)
CO2: 28 mmol/L (ref 22–32)
CREATININE: 0.77 mg/dL (ref 0.61–1.24)
Calcium: 8.2 mg/dL — ABNORMAL LOW (ref 8.9–10.3)
GFR calc non Af Amer: >60 mL/min (ref >60)
Glucose, Bld: 174 mg/dL — ABNORMAL HIGH (ref 65–99)
POTASSIUM: 4.4 mmol/L (ref 3.5–5.1)
SODIUM: 134 mmol/L — AB (ref 135–145)

## 2016-04-05 LAB — CBC
HEMATOCRIT: 33.6 % — AB (ref 39.0–52.0)
HEMOGLOBIN: 11.3 g/dL — AB (ref 13.0–17.0)
MCH: 33 pg (ref 26.0–34.0)
MCHC: 33.6 g/dL (ref 30.0–36.0)
MCV: 98.2 fL (ref 78.0–100.0)
Platelets: 319 10*3/uL (ref 150–400)
RBC: 3.42 MIL/uL — ABNORMAL LOW (ref 4.22–5.81)
RDW: 14.5 % (ref 11.5–15.5)
WBC: 14.2 10*3/uL — AB (ref 4.0–10.5)

## 2016-04-05 LAB — BRAIN NATRIURETIC PEPTIDE: B Natriuretic Peptide: 170.5 pg/mL — ABNORMAL HIGH (ref 0.0–100.0)

## 2016-04-05 MED ORDER — METOCLOPRAMIDE HCL 5 MG/ML IJ SOLN
10.0000 mg | Freq: Once | INTRAMUSCULAR | Status: AC
  Administered 2016-04-05: 10 mg via INTRAVENOUS
  Filled 2016-04-05: qty 2

## 2016-04-05 MED ORDER — OSMOLITE 1.5 CAL PO LIQD
1000.0000 mL | ORAL | Status: DC
Start: 2016-04-05 — End: 2016-04-06
  Filled 2016-04-05 (×3): qty 1000

## 2016-04-05 MED ORDER — PANCRELIPASE (LIP-PROT-AMYL) 12000-38000 UNITS PO CPEP
36000.0000 [IU] | ORAL_CAPSULE | Freq: Three times a day (TID) | ORAL | Status: DC
  Administered 2016-04-05 – 2016-04-07 (×6): 36000 [IU] via ORAL
  Filled 2016-04-05 (×6): qty 3

## 2016-04-05 MED ORDER — FAMOTIDINE 20 MG PO TABS
20.0000 mg | ORAL_TABLET | Freq: Two times a day (BID) | ORAL | Status: DC
  Administered 2016-04-05 – 2016-04-07 (×4): 20 mg via ORAL
  Filled 2016-04-05 (×4): qty 1

## 2016-04-05 MED ORDER — POTASSIUM CHLORIDE CRYS ER 20 MEQ PO TBCR
40.0000 meq | EXTENDED_RELEASE_TABLET | Freq: Every day | ORAL | Status: DC
  Administered 2016-04-06 – 2016-04-07 (×2): 40 meq via ORAL
  Filled 2016-04-05 (×2): qty 2

## 2016-04-05 NOTE — Progress Notes (Signed)
[facility] PROGRESS NOTE  Colin West UJW:[id number] DOB: [birth year] DOA: 03/23/2016  PCP: [name], MD  Brief History/Interval Summary: 82 year old Caucasian male with a past medical history of hypertension, depression, nephrolithiasis, presented with abdominal pain. Patient was found to have acute pancreatitis. He was also found to have gallstones. Patient was hospitalized for further management. General surgery was consulted. Patient was very slow to improve. He underwent a repeat CT scan which showed progression of pancreatitis and findings suggestive of hemorrhagic pancreatitis.  Reason for Visit: Acute pancreatitis  Consultants: Gastroenterology and general surgery  Procedures: None  Antibiotics: Imipenem 10/12. Stopped on 10/20  Previously on Cipro and Flagyl  Subjective/Interval History: Patient continues to feel well. Denies any abdominal pain. Tolerating his diet. Denies any nausea or vomiting.   ROS: Denies any chest pain or shortness of breath.  Objective:  Vital Signs  Vitals:   04/04/16 1350 04/04/16 2019 04/05/16 0408 04/05/16 0624  BP: 136/62 (!) 144/55 134/70   Pulse: 79 88 85   Resp: 16 15 16    Temp: 98.2 F (36.8 C)  98.5 F (36.9 C)   TempSrc: Oral Oral Oral   SpO2: 99% 99% 97%   Weight:    (P) 78 kg (171 lb 15.3 oz)  Height:        Intake/Output Summary (Last 24 hours) at 04/05/16 0836 Last data filed at 04/05/16 0740  Gross per 24 hour  Intake             1395 ml  Output             1725 ml  Net             -330 ml   Filed Weights   04/03/16 0500 04/04/16 0542 04/05/16 0624  Weight: 81.2 kg (179 lb 0.2 oz) 77.6 kg (171 lb 1.2 oz) (P) 78 kg (171 lb 15.3 oz)    General appearance: alert, cooperative, appears stated age and no distress NG tube is noted. Resp: clear to auscultation bilaterally Cardio: regular rate and rhythm, S1, S2 normal, no murmur, click, rub or gallop GI: Abdomen is soft. Nontender. Bowel  sounds are present. No masses or organomegaly. Neurologic: Awake and alert. Oriented 3. Focal neurological deficits.  Lab Results:  Data Reviewed: I have personally reviewed following labs and imaging studies  CBC:  Recent Labs Lab 03/31/16 0455 04/01/16 1020 04/03/16 0455 04/04/16 0400 04/05/16 0500  WBC 16.9* 19.0* 20.4* 16.1* 14.2*  NEUTROABS  --  16.1*  --   --   --   HGB 12.1* 11.9* 11.9* 11.6* 11.3*  HCT 34.9* 34.1* 35.9* 34.7* 33.6*  MCV 93.6 93.4 98.4 97.5 98.2  PLT 185 197 270 266 319    Basic Metabolic Panel:  Recent Labs Lab 03/31/16 0455 04/01/16 0500 04/03/16 0850 04/04/16 0400 04/05/16 0647  NA 136 137 133* 133* 134*  K 3.9 3.9 4.1 4.0 4.4  CL 107 105 101 101 102  CO2 24 27 28 26 28   GLUCOSE 197* 203* 137* 175* 174*  BUN 27* 23* 21* 19 21*  CREATININE 0.68 0.61 0.69 0.70 0.77  CALCIUM 8.0* 8.0* 8.3* 8.2* 8.2*    GFR: Estimated Creatinine Clearance: 76 mL/min (by C-G formula based on SCr of 0.77 mg/dL).  Liver Function Tests:  Recent Labs Lab 03/31/16 0455 04/01/16 0500 04/03/16 0850 04/04/16 0400  AST 57* 49* 51* 53*  ALT 57 53 56 58  ALKPHOS 178* 187* 167* 155*  BILITOT 2.9* 2.3* 1.8* 1.8*  PROT 5.5* 5.5* 5.5* 5.6*  ALBUMIN 2.4* 2.3* 2.4* 2.4*    CBG:  Recent Labs Lab 03/30/16 1544 03/30/16 2014 03/31/16 0013 03/31/16 1955 04/04/16 0421  GLUCAP 186* 209* 167* 238* 178*     Radiology Studies: Ct Abdomen Pelvis W Contrast  Result Date: 04/03/2016 CLINICAL DATA:  Pancreatitis. EXAM: CT ABDOMEN AND PELVIS WITH CONTRAST TECHNIQUE: Multidetector CT imaging of the abdomen and pelvis was performed using the standard protocol following bolus administration of intravenous contrast. CONTRAST:  100mL ISOVUE-300 IOPAMIDOL (ISOVUE-300) INJECTION 61% COMPARISON:  03/26/2016 FINDINGS: Lower chest:  Stable areas of scarring right base. Hepatobiliary: The liver shows diffusely decreased attenuation suggesting steatosis. Gallstones again  noted. No intrahepatic or extrahepatic biliary dilation. Pancreas: Interval slight progression of diffuse peripancreatic edema/inflammation. Pancreas enhances throughout with no overt findings of pancreatic necrosis. No evidence for organized pseudocyst or rim enhancing abscess. Spleen: No splenomegaly. No focal mass lesion. Adrenals/Urinary Tract: No adrenal nodule or mass. No evidence for enhancing lesion in either kidney. No evidence for hydroureter. Left UVJ stone is unchanged. The urinary bladder appears normal for the degree of distention. Stomach/Bowel: Stomach is distended with fluid. A feeding tube tip is positioned in the transverse duodenum. Duodenum is mildly distended. No small bowel wall thickening. No small bowel dilatation. The terminal ileum is normal. The appendix is normal. Diverticuli are seen scattered along the entire length of the colon without CT findings of diverticulitis. Vascular/Lymphatic: There is abdominal aortic atherosclerosis without aneurysm. As before, there is marked mass-effect/attenuation of the superior mesenteric vein just proximal to the porta splenic confluence. Mass-effect on the splenic vein has increased in the interval although the vessel does remain patent. No evidence for pseudoaneurysm. Celiac axis and SMA opacified. There is no gastrohepatic or hepatoduodenal ligament lymphadenopathy. No intraperitoneal or retroperitoneal lymphadenopathy. No pelvic sidewall lymphadenopathy. Reproductive: Prostate gland is enlarged. Other: Trace intraperitoneal free fluid noted in the pelvis. Musculoskeletal: Bone windows reveal no worrisome lytic or sclerotic osseous lesions. IMPRESSION: 1. Slight interval progression of diffuse peripancreatic edema/inflammation. No evidence for pancreatic necrosis. No organized pseudocyst or rim enhancing fluid collection to suggest abscess. 2. Mass-effect on the superior mesenteric vein and splenic vein, slightly progressed in the interval although  both vessels remain patent. 3. Interval decrease in intraperitoneal free fluid. 4. Cholelithiasis. 5. Stable appearance left UVJ stone without left hydroureteronephrosis. 6. Hepatic steatosis. 7. Interval resolution of left pleural effusion with persistent tiny right pleural effusion. 8. Abdominal aortic atherosclerosis. Electronically Signed   By: Kennith CenterEric  Mansell M.D.   On: 04/03/2016 17:05     Medications:  Scheduled: . chlorhexidine  15 mL Mouth Rinse BID  . enoxaparin (LOVENOX) injection  40 mg Subcutaneous Daily  . feeding supplement (GLUCERNA SHAKE)  237 mL Oral TID BM  . feeding supplement (PRO-STAT SUGAR FREE 64)  30 mL Per Tube BID  . free water  50 mL Per Tube Q4H  . insulin aspart  0-9 Units Subcutaneous Q4H  . lisinopril  40 mg Oral q morning - 10a  . mouth rinse  15 mL Mouth Rinse q12n4p  . metoprolol tartrate  25 mg Oral BID  . potassium chloride  40 mEq Per Tube Daily  . ranitidine  150 mg Per Tube BID  . saccharomyces boulardii  250 mg Oral BID   Continuous: . feeding supplement (OSMOLITE 1.5 CAL) 1,000 mL (04/05/16 0606)   ZOX:[id number]:acetaminophen, albuterol, hydrALAZINE, iopamidol, morphine injection, ondansetron (ZOFRAN) IV, sodium chloride flush, traMADol  Assessment/Plan:  Principal Problem:   Acute  pancreatitis Active Problems:   Essential hypertension   Abdominal pain   Hyperbilirubinemia   Malnutrition of moderate degree   Cholelithiasis    Acute hemorrhagic pancreatitis Likely precipitated by gallstones. Patient has been slow to recover and did experience setback due to worsening Hemorrhagic pancreatitis. Patient was started on imipenem. Gastroenterology and general surgery was consulted. Over the last few days, patient has shown significant improvement. GI to see him again today and decide further NG tube can be discontinued. Patient is tolerating his diet currently. Patient was started on feedings via NG tube on 10/14. CT scan was repeated 10/19 with more or  less stable findings.   Cholelithiasis with mild transaminitis and hyperbilirubinemia Followed by general surgery. Patient will need cholecystectomy eventually, but due to his severe pancreatitis not to be done for the next several weeks. Bilirubin has improved.   Leukocytosis Likely from severe pancreatitis. WBC has improved. He remains afebrile. Primaxin has been discontinued.  Essential Hypertension/sinus tachycardia Blood pressures are reasonably well controlled. Heart rate is normal. Continue lisinopril and metoprolol.   Diabetes mellitus type 2 with hyperglycemia Patient was on metformin at home. Continue to monitor CBGs. HbA1c is 5.9. Continue SSI.  Hypokalemia Repleted  Nephrolithiasis with nonspecific abnormal urinary bladder findings on CT scan. Outpatient follow-up with urology. UA did not show any blood.  DVT Prophylaxis: Lovenox    Code Status: DO NOT RESUSCITATE  Family Communication: Discussed with the patient  Disposition Plan: Continue management as outlined above. Continue to mobilize.     LOS: 12 days   [facility]  [facility] Pager [phone] 04/05/2016, 8:36 AM  If 7PM-7AM, please contact night-coverage at [email or web address], password [facility]

## 2016-04-05 NOTE — Progress Notes (Signed)
Pt finished dinner at 7pm, pt tolerated dinner well with no reports of nausea or abdominal pain. NG tube removed at 2050 per MD order. Will continue to monitor closely.

## 2016-04-05 NOTE — Progress Notes (Addendum)
Colin West Colin West 12:09 PM  Subjective: Patient really wants his feeding tube out and I did turn off his feeding tube after our conversation and his hiccups stopped immediately and his case was discussed with the nurse as well as my partner Dr. Bosie ClosSchooler and his hospital computer chart was reviewed and he has no new complaints and tolerated low fat breakfast  Objective: Vital signs stable afebrile no acute distress abdomen is soft nontender occasional bowel sounds white count slightly decreased  Assessment: Pancreatitis  Plan: Okay to turn off tube feeds and if he tolerates lunch and dinner as well and a remove NG tube and will check on tomorrow and will add pancreatic enzymes with meals as well  [facility] SouthwestMAG[facility],Kaelynne West E  Pager [phone] After 5PM or if no answer call [phone]

## 2016-04-05 NOTE — Progress Notes (Signed)
Patient ate 100% of lunch and tolerated it without nausea or increased abdominal pain. Feeding tube remains clamped Patient is eating dinner now. I will convey above to night shift Rn to monitor dinner intake and tolerance, and remove tube if appropriate.

## 2016-04-06 LAB — GLUCOSE, CAPILLARY: Glucose-Capillary: 134 mg/dL — ABNORMAL HIGH (ref 65–99)

## 2016-04-06 MED ORDER — METOCLOPRAMIDE HCL 5 MG/ML IJ SOLN
5.0000 mg | Freq: Three times a day (TID) | INTRAMUSCULAR | Status: DC | PRN
  Administered 2016-04-06 – 2016-04-07 (×2): 5 mg via INTRAVENOUS
  Filled 2016-04-06 (×2): qty 2

## 2016-04-06 MED ORDER — METOCLOPRAMIDE HCL 5 MG/ML IJ SOLN
5.0000 mg | Freq: Once | INTRAMUSCULAR | Status: AC
  Administered 2016-04-06: 5 mg via INTRAVENOUS
  Filled 2016-04-06: qty 2

## 2016-04-06 NOTE — Progress Notes (Addendum)
[facility] PROGRESS NOTE  Colin West WGN:[id number] DOB: [birth year] DOA: 03/23/2016  PCP: Verl BangsADIONTCHENKO, ALEXEI, MD  Brief History/Interval Summary: 82 year old Caucasian male with a past medical history of hypertension, depression, nephrolithiasis, presented with abdominal pain. Patient was found to have acute pancreatitis. He was also found to have gallstones. Patient was hospitalized for further management. General surgery was consulted. Patient was very slow to improve. He underwent a repeat CT scan which showed progression of pancreatitis and findings suggestive of hemorrhagic pancreatitis.  Reason for Visit: Acute pancreatitis  Consultants: Gastroenterology and general surgery  Procedures: None  Antibiotics: Imipenem 10/12. Stopped on 10/20  Previously on Cipro and Flagyl  Subjective/Interval History: Patient continues to feel well. Happy that his NG tube was removed last night. Denies any abdominal pain. However, he continues to have hiccups occasionally. Denies any nausea or vomiting. Tolerating his diet. Ambulating.  ROS: Denies any chest pain or shortness of breath.  Objective:  Vital Signs  Vitals:   04/05/16 0624 04/05/16 0949 04/05/16 1431 04/06/16 0515  BP:  (!) 144/67 (!) 144/60 140/65  Pulse:  83 77 75  Resp:   16 15  Temp:   98.6 F (37 C) 98.3 F (36.8 C)  TempSrc:   Oral Oral  SpO2:   99% 100%  Weight: 78 kg (171 lb 15.3 oz)   76.9 kg (169 lb 8.5 oz)  Height:        Intake/Output Summary (Last 24 hours) at 04/06/16 0837 Last data filed at 04/06/16 0745  Gross per 24 hour  Intake              615 ml  Output             1700 ml  Net            -1085 ml   Filed Weights   04/04/16 0542 04/05/16 0624 04/06/16 0515  Weight: 77.6 kg (171 lb 1.2 oz) 78 kg (171 lb 15.3 oz) 76.9 kg (169 lb 8.5 oz)    General appearance: alert, cooperative, appears stated age and no distress Resp: clear to auscultation bilaterally Cardio: regular rate and  rhythm, S1, S2 normal, no murmur, click, rub or gallop GI: Abdomen is soft. Nontender. Bowel sounds are present. No masses or organomegaly. Neurologic: Awake and alert. Oriented 3. Focal neurological deficits.  Lab Results:  Data Reviewed: I have personally reviewed following labs and imaging studies  CBC:  Recent Labs Lab 03/31/16 0455 04/01/16 1020 04/03/16 0455 04/04/16 0400 04/05/16 0500  WBC 16.9* 19.0* 20.4* 16.1* 14.2*  NEUTROABS  --  16.1*  --   --   --   HGB 12.1* 11.9* 11.9* 11.6* 11.3*  HCT 34.9* 34.1* 35.9* 34.7* 33.6*  MCV 93.6 93.4 98.4 97.5 98.2  PLT 185 197 270 266 319    Basic Metabolic Panel:  Recent Labs Lab 03/31/16 0455 04/01/16 0500 04/03/16 0850 04/04/16 0400 04/05/16 0647  NA 136 137 133* 133* 134*  K 3.9 3.9 4.1 4.0 4.4  CL 107 105 101 101 102  CO2 24 27 28 26 28   GLUCOSE 197* 203* 137* 175* 174*  BUN 27* 23* 21* 19 21*  CREATININE 0.68 0.61 0.69 0.70 0.77  CALCIUM 8.0* 8.0* 8.3* 8.2* 8.2*    GFR: Estimated Creatinine Clearance: 76 mL/min (by C-G formula based on SCr of 0.77 mg/dL).  Liver Function Tests:  Recent Labs Lab 03/31/16 0455 04/01/16 0500 04/03/16 0850 04/04/16 0400  AST 57* 49* 51* 53*  ALT  57 53 56 58  ALKPHOS 178* 187* 167* 155*  BILITOT 2.9* 2.3* 1.8* 1.8*  PROT 5.5* 5.5* 5.5* 5.6*  ALBUMIN 2.4* 2.3* 2.4* 2.4*    CBG:  Recent Labs Lab 03/30/16 1544 03/30/16 2014 03/31/16 0013 03/31/16 1955 04/04/16 0421  GLUCAP 186* 209* 167* 238* 178*     Radiology Studies: No results found.   Medications:  Scheduled: . chlorhexidine  15 mL Mouth Rinse BID  . enoxaparin (LOVENOX) injection  40 mg Subcutaneous Daily  . famotidine  20 mg Oral BID  . feeding supplement (GLUCERNA SHAKE)  237 mL Oral TID BM  . feeding supplement (PRO-STAT SUGAR FREE 64)  30 mL Per Tube BID  . free water  50 mL Per Tube Q4H  . insulin aspart  0-9 Units Subcutaneous Q4H  . lipase/protease/amylase  36,000 Units Oral TID AC  .  lisinopril  40 mg Oral q morning - 10a  . mouth rinse  15 mL Mouth Rinse q12n4p  . metoCLOPramide (REGLAN) injection  5 mg Intravenous Once  . metoprolol tartrate  25 mg Oral BID  . potassium chloride  40 mEq Oral Daily  . saccharomyces boulardii  250 mg Oral BID   Continuous: . feeding supplement (OSMOLITE 1.5 CAL)     WUJ:[id number], albuterol, hydrALAZINE, iopamidol, morphine injection, ondansetron (ZOFRAN) IV, sodium chloride flush, traMADol  Assessment/Plan:  Principal Problem:   Acute pancreatitis Active Problems:   Essential hypertension   Abdominal pain   Hyperbilirubinemia   Malnutrition of moderate degree   Cholelithiasis    Acute hemorrhagic pancreatitis Likely precipitated by gallstones. Patient has been slow to recover and did experience setback due to worsening Hemorrhagic pancreatitis. Patient was started on imipenem. Gastroenterology and general surgery was consulted. Over the last few days, patient has shown significant improvement. Imipenem was discontinued. Patient seen by gastroenterology. His diet was advanced. NG tube was discontinued last night. He feels well. Continues to have some hiccups. He'll be given Reglan for the same. Continue to mobilize. If he remains stable and if labs show improvement, he could go home as early as tomorrow. He will need to follow-up with gastroenterology in 1-2 weeks and then have repeat CT scan in one month. Discussed with Dr. Ewing Schlein this morning.   Cholelithiasis with mild transaminitis and hyperbilirubinemia Seen by general surgery. Patient will need cholecystectomy eventually, but due to his severe pancreatitis not to be done for the next several weeks. Bilirubin has improved.   Leukocytosis Likely from severe pancreatitis. WBC has improved. He remains afebrile. Primaxin has been discontinued.  Essential Hypertension/sinus tachycardia Blood pressures are reasonably well controlled. Heart rate is normal. Continue lisinopril  and metoprolol.   Diabetes mellitus type 2 with hyperglycemia Patient was on metformin at home. Continue to monitor CBGs. HbA1c is 5.9. Continue SSI.  Hypokalemia Repleted  Nephrolithiasis with nonspecific abnormal urinary bladder findings on CT scan. Outpatient follow-up with urology. UA did not show any blood.  DVT Prophylaxis: Lovenox    Code Status: DO NOT RESUSCITATE  Family Communication: Discussed with the patient  Disposition Plan: Continue management as outlined above. Continue to mobilize. Anticipate discharge tomorrow.     LOS: 13 days   [facility]  [facility] Pager [phone] 04/06/2016, 8:37 AM  If 7PM-7AM, please contact night-coverage at [email or web address], password [facility]

## 2016-04-06 NOTE — Progress Notes (Signed)
Michiel SitesGraham N Chou 10:02 AM  Subjective: Patient doing great without his feeding tube and eating fine and no new complaints and his hiccups are better but still there and he has moved his bowels and is walking in the halls and has no new complaints and his case discussed with the primary team as well  Objective: Vital signs stable afebrile no acute distress abdomen is soft nontender good bowel sounds no new labs  Assessment: Gallstone pancreatitis  Plan: Continue present management call us back if we could be of any help during this hospital stay otherwise follow-up in the office with one of my partners in a week or 2 and consider repeat CT in one month and cholecystectomy after that if okay  [facility] Center[facility],Lloyd Ayo E  Pager [phone] After 5PM or if no answer call [phone]

## 2016-04-06 NOTE — Progress Notes (Signed)
Dr.Krishnan aware via phone pt needs something for hiccups. See new order entered into EPIC by MD.

## 2016-04-06 NOTE — Progress Notes (Signed)
Nutrition Follow-up  DOCUMENTATION CODES:   Non-severe (moderate) malnutrition in context of acute illness/injury  INTERVENTION:   -Continue Glucerna Shake po TID, each supplement provides 220 kcal and 10 grams of protein -Continue Prostat liquid protein PO 30 ml BID with meals, each supplement provides 100 kcal, 15 grams protein. -D/c free water order -Encourage PO intake -RD to continue to monitor. Will provide Glucerna coupons prior to discharge.  NUTRITION DIAGNOSIS:   Increased nutrient needs related to other (see comment) (acute pancreatitis) as evidenced by estimated needs.  Ongoing.  GOAL:   Patient will meet greater than or equal to 90% of their needs  Progressing.  MONITOR:   Labs, Weight trends, TF tolerance, I & O's  ASSESSMENT:   82 y.o. male, With history of depression, kidney stones, hypertension came to the ED with worsening abdominal pain and vomiting. Patient says that he vomited 4 times last night and has a constant epigastric pain. He denies diarrhea. No fever or chills. No dysuria. No chest pain or shortness of breath.  Patient in room with family and nurse tech at bedside. Pt reports feeling much better with NGT removed. Pt reports eating 1/2 an omelette, fruit, english muffin, OJ, and coffee this morning. PO intake: 25-100%. He feels hungry for lunch. States he likes the Glucerna shakes and enjoys drinking them. Encouraged at least 2-3 supplements a day. Will continue Prostat as pt is still accepting these. RD will provide Glucerna coupons prior to discharge.   Medications: CREON capsule TID, Reglan IV once, K-DUR tablet daily, Florastor capsule BID Labs reviewed: CBGs: 178 Low Na  Diet Order:  Diet Heart Room service appropriate? Yes; Fluid consistency: Thin  Skin:  Reviewed, no issues  Last BM:  10/20  Height:   Ht Readings from Last 1 Encounters:  03/23/16 5\' 10"  (1.778 m)    Weight:   Wt Readings from Last 1 Encounters:  04/06/16 169  lb 8.5 oz (76.9 kg)    Ideal Body Weight:  75.5 kg  BMI:  Body mass index is 24.33 kg/m.  Estimated Nutritional Needs:   Kcal:  [id number]  Protein:  110-120g  Fluid:  2-2.2L/day  EDUCATION NEEDS:   Education needs addressed  Tilda FrancoLindsey Ambers Iyengar, MS, RD, LDN Pager: [phone] After Hours Pager: [phone]

## 2016-04-07 LAB — BASIC METABOLIC PANEL
Anion gap: 8 (ref 5–15)
BUN: 22 mg/dL — ABNORMAL HIGH (ref 6–20)
CALCIUM: 8.6 mg/dL — AB (ref 8.9–10.3)
CO2: 26 mmol/L (ref 22–32)
CREATININE: 0.69 mg/dL (ref 0.61–1.24)
Chloride: 101 mmol/L (ref 101–111)
GFR calc non Af Amer: >60 mL/min (ref >60)
Glucose, Bld: 131 mg/dL — ABNORMAL HIGH (ref 65–99)
Potassium: 4.1 mmol/L (ref 3.5–5.1)
SODIUM: 135 mmol/L (ref 135–145)

## 2016-04-07 LAB — CBC
HCT: 34.9 % — ABNORMAL LOW (ref 39.0–52.0)
HEMOGLOBIN: 11.9 g/dL — AB (ref 13.0–17.0)
MCH: 33.2 pg (ref 26.0–34.0)
MCHC: 34.1 g/dL (ref 30.0–36.0)
MCV: 97.5 fL (ref 78.0–100.0)
Platelets: 365 10*3/uL (ref 150–400)
RBC: 3.58 MIL/uL — ABNORMAL LOW (ref 4.22–5.81)
RDW: 14.2 % (ref 11.5–15.5)
WBC: 12.8 10*3/uL — ABNORMAL HIGH (ref 4.0–10.5)

## 2016-04-07 LAB — GLUCOSE, CAPILLARY
GLUCOSE-CAPILLARY: 136 mg/dL — AB (ref 65–99)
Glucose-Capillary: 148 mg/dL — ABNORMAL HIGH (ref 65–99)

## 2016-04-07 MED ORDER — METOPROLOL TARTRATE 25 MG PO TABS: 25.0000 mg | ORAL_TABLET | Freq: Two times a day (BID) | ORAL | 0 refills | Status: DC

## 2016-04-07 MED ORDER — PANCRELIPASE (LIP-PROT-AMYL) 36000-114000 UNITS PO CPEP: 36000.0000 [IU] | ORAL_CAPSULE | Freq: Three times a day (TID) | ORAL | 2 refills | Status: DC

## 2016-04-07 MED ORDER — TRAMADOL HCL 50 MG PO TABS: 50.0000 mg | ORAL_TABLET | Freq: Four times a day (QID) | ORAL | 0 refills | Status: DC | PRN

## 2016-04-07 MED ORDER — GLUCERNA SHAKE PO LIQD: 237.0000 mL | Freq: Three times a day (TID) | ORAL | 0 refills | Status: DC

## 2016-04-07 MED ORDER — SACCHAROMYCES BOULARDII 250 MG PO CAPS: 250.0000 mg | ORAL_CAPSULE | Freq: Two times a day (BID) | ORAL | 0 refills | Status: DC

## 2016-04-07 MED ORDER — POTASSIUM CHLORIDE CRYS ER 20 MEQ PO TBCR: 20.0000 meq | EXTENDED_RELEASE_TABLET | Freq: Every day | ORAL | 0 refills | Status: DC

## 2016-04-07 MED ORDER — PRO-STAT SUGAR FREE PO LIQD: 30.0000 mL | Freq: Two times a day (BID) | ORAL | 0 refills | Status: DC

## 2016-04-07 MED ORDER — METOCLOPRAMIDE HCL 5 MG PO TABS: 5.0000 mg | ORAL_TABLET | Freq: Three times a day (TID) | ORAL | 0 refills | Status: DC | PRN

## 2016-04-07 MED ORDER — FAMOTIDINE 20 MG PO TABS: 20.0000 mg | ORAL_TABLET | Freq: Two times a day (BID) | ORAL | 0 refills | Status: DC

## 2016-04-07 NOTE — Discharge Instructions (Signed)

## 2016-04-07 NOTE — Progress Notes (Signed)
Patient alert and oriented with pain controlled. Patient given discharge instructions and prescriptions. Patient verbalized understanding of instructions. All questions and concerns answered.

## 2016-04-07 NOTE — Progress Notes (Signed)
Discharge planning, spoke with patient at beside. [facility] Needs [facility]W, declines 3-n-1, contacted [facility] to deliver to room. [phone]

## 2016-04-07 NOTE — Progress Notes (Signed)
Nutrition Brief Follow-up  RD provided pt with Glucerna shake coupons as requested. Pt reports feeling well except for his hiccups. Pt currently eating 25-50% of meals. Pt tolerating with no issues. He is drinking his Glucerna shakes with no problem.  Labs and medications reviewed.   No further nutrition interventions warranted at this time. If nutrition issues arise, please consult RD.   Tilda FrancoLindsey Jari Carollo, MS, RD, LDN Pager: [phone] After Hours Pager: [phone]

## 2016-04-07 NOTE — Discharge Summary (Signed)
[facility]  Physician Discharge Summary   Patient ID: Colin West MRN: [id number] DOB/AGE: 10/22/1933 82 y.o.  Admit date: 03/23/2016 Discharge date: 04/07/2016  PCP: Colin Bangs, MD  DISCHARGE DIAGNOSES:  Principal Problem:   Acute pancreatitis Active Problems:   Essential hypertension   Abdominal pain   Hyperbilirubinemia   Malnutrition of moderate degree   Cholelithiasis   RECOMMENDATIONS FOR OUTPATIENT FOLLOW UP: 1. He'll have outpatient follow-up with gastroenterology. At that point in time. CT scan will be repeated. 2. Subsequently, he will need to have a cholecystectomy. Anticipated this happening about 4-6 weeks from now. 3. Eventually, patient will also need follow-up with urology for nonspecific bladder findings noted on CT scan along with nonobstructive nephrolithiasis.   DISCHARGE CONDITION: fair  Diet recommendation: Modified carbohydrate  Filed Weights   04/05/16 0624 04/06/16 0515 04/07/16 0616  Weight: 78 kg (171 lb 15.3 oz) 76.9 kg (169 lb 8.5 oz) 74.5 kg (164 lb 3.9 oz)    INITIAL HISTORY: 82 year old Caucasian male with a past medical history of hypertension, depression, nephrolithiasis, presented with abdominal pain. Patient was found to have acute pancreatitis. He was also found to have gallstones. Patient was hospitalized for further management. General surgery was consulted. Patient was very slow to improve. He underwent a repeat CT scan which showed progression of pancreatitis and findings suggestive of hemorrhagic pancreatitis.  Consultations:  Gastroenterology and general surgery  Procedures:  None  HOSPITAL COURSE:   Acute hemorrhagic pancreatitis Likely precipitated by gallstones. Patient has been slow to recover and did experience setback due to worsening Hemorrhagic pancreatitis. Patient was started on imipenem. Gastroenterology and general surgery was consulted. NGT was placed and feedings were started. Over the  last few days, patient has shown significant improvement. Imipenem was discontinued. Patient seen by gastroenterology. His diet was advanced, which he has been tolerating well. NG tube was discontinued. Continues to have some hiccups which is usually relieved with Reglan. Patient has been ambulating. Feels quite well. Denies any abdominal pain. Okay for discharge today. He will have follow-up with gastroenterology in 1-2 weeks. CT scan to be repeated around that time period and then he will need to have his bladder removed in 4-6 week timeframe.   Cholelithiasis with mild transaminitis and hyperbilirubinemia Seen by general surgery. Patient will need cholecystectomy eventually, but due to his severe pancreatitis not to be done for the next several weeks. Bilirubin has improved.   Leukocytosis Likely from severe pancreatitis. WBC has improved. He remains afebrile. Primaxin has been discontinued.  Essential Hypertension/sinus tachycardia Blood pressures are reasonably well controlled. Heart rate is normal. Continue lisinopril and metoprolol.   Diabetes mellitus type 2 with hyperglycemia HbA1c is 5.9. Okay to resume metformin.  Hypokalemia Repleted  Nephrolithiasis with nonspecific abnormal urinary bladder findings on CT scan. Outpatient follow-up with urology. UA did not show any blood.  Overall much improved. Patient very keen on going home today. Home health will be ordered. PICC line will be removed prior to discharge. Stable for discharge today.   PERTINENT LABS:  The results of significant diagnostics from this hospitalization (including imaging, microbiology, ancillary and laboratory) are listed below for reference.     Labs: Basic Metabolic Panel:  Recent Labs Lab 04/01/16 0500 04/03/16 0850 04/04/16 0400 04/05/16 0647 04/07/16 0352  NA 137 133* 133* 134* 135  K 3.9 4.1 4.0 4.4 4.1  CL 105 101 101 102 101  CO2 27 28 26 28 26   GLUCOSE 203* 137* 175* 174* 131*  BUN 23* 21* 19 21* 22*  CREATININE 0.61 0.69 0.70 0.77 0.69  CALCIUM 8.0* 8.3* 8.2* 8.2* 8.6*   Liver Function Tests:  Recent Labs Lab 04/01/16 0500 04/03/16 0850 04/04/16 0400  AST 49* 51* 53*  ALT 53 56 58  ALKPHOS 187* 167* 155*  BILITOT 2.3* 1.8* 1.8*  PROT 5.5* 5.5* 5.6*  ALBUMIN 2.3* 2.4* 2.4*   CBC:  Recent Labs Lab 04/01/16 1020 04/03/16 0455 04/04/16 0400 04/05/16 0500 04/07/16 0352  WBC 19.0* 20.4* 16.1* 14.2* 12.8*  NEUTROABS 16.1*  --   --   --   --   HGB 11.9* 11.9* 11.6* 11.3* 11.9*  HCT 34.1* 35.9* 34.7* 33.6* 34.9*  MCV 93.4 98.4 97.5 98.2 97.5  PLT 197 270 266 319 365   BNP: BNP (last 3 results)  Recent Labs  04/05/16 0647  BNP 170.5*    CBG:  Recent Labs Lab 03/31/16 1955 04/04/16 0421 04/06/16 2359 04/07/16 0356 04/07/16 0714  GLUCAP 238* 178* 134* 136* 148*     IMAGING STUDIES Dg Abd 1 View  Result Date: 03/28/2016 CLINICAL DATA:  Feeding tube placement EXAM: ABDOMEN - 1 VIEW COMPARISON:  None. FINDINGS: Weighted tip feeding tube appears well positioned with tip in the expected region of the proximal transverse portion of the duodenum. Oral contrast material noted within the nondistended colon. No evidence of soft tissue mass or abnormal fluid collection. Degenerative changes noted throughout the scoliotic thoracolumbar spine. No acute or suspicious osseous finding. IMPRESSION: Weighted tip feeding tube appears well positioned with tip in the expected region of the proximal transverse portion of the duodenum. Electronically Signed   By: Bary Richard M.D.   On: 03/28/2016 12:51   Dg Abd 1 View  Result Date: 03/27/2016 CLINICAL DATA:  Encounter for feeding tube placement. EXAM: ABDOMEN - 1 VIEW COMPARISON:  CT 03/26/2016 FINDINGS: Feeding tube is coiled in the stomach. The tip is at the gastric antrum. Oral contrast in the colon. Nonobstructive bowel gas pattern. Elevation of the right hemidiaphragm. IMPRESSION: Feeding tube tip at  the gastric antrum. Electronically Signed   By: Richarda Overlie M.D.   On: 03/27/2016 11:22   Ct Abdomen Pelvis W Contrast  Result Date: 04/03/2016 CLINICAL DATA:  Pancreatitis. EXAM: CT ABDOMEN AND PELVIS WITH CONTRAST TECHNIQUE: Multidetector CT imaging of the abdomen and pelvis was performed using the standard protocol following bolus administration of intravenous contrast. CONTRAST:  ISOVUE-300 IOPAMIDOL (ISOVUE-300) INJECTION 61% COMPARISON:  03/26/2016 FINDINGS: Lower chest:  Stable areas of scarring right base. Hepatobiliary: The liver shows diffusely decreased attenuation suggesting steatosis. Gallstones again noted. No intrahepatic or extrahepatic biliary dilation. Pancreas: Interval slight progression of diffuse peripancreatic edema/inflammation. Pancreas enhances throughout with no overt findings of pancreatic necrosis. No evidence for organized pseudocyst or rim enhancing abscess. Spleen: No splenomegaly. No focal mass lesion. Adrenals/Urinary Tract: No adrenal nodule or mass. No evidence for enhancing lesion in either kidney. No evidence for hydroureter. Left UVJ stone is unchanged. The urinary bladder appears normal for the degree of distention. Stomach/Bowel: Stomach is distended with fluid. A feeding tube tip is positioned in the transverse duodenum. Duodenum is mildly distended. No small bowel wall thickening. No small bowel dilatation. The terminal ileum is normal. The appendix is normal. Diverticuli are seen scattered along the entire length of the colon without CT findings of diverticulitis. Vascular/Lymphatic: There is abdominal aortic atherosclerosis without aneurysm. As before, there is marked mass-effect/attenuation of the superior mesenteric vein just proximal to the porta splenic confluence.  Mass-effect on the splenic vein has increased in the interval although the vessel does remain patent. No evidence for pseudoaneurysm. Celiac axis and SMA opacified. There is no gastrohepatic or  hepatoduodenal ligament lymphadenopathy. No intraperitoneal or retroperitoneal lymphadenopathy. No pelvic sidewall lymphadenopathy. Reproductive: Prostate gland is enlarged. Other: Trace intraperitoneal free fluid noted in the pelvis. Musculoskeletal: Bone windows reveal no worrisome lytic or sclerotic osseous lesions. IMPRESSION: 1. Slight interval progression of diffuse peripancreatic edema/inflammation. No evidence for pancreatic necrosis. No organized pseudocyst or rim enhancing fluid collection to suggest abscess. 2. Mass-effect on the superior mesenteric vein and splenic vein, slightly progressed in the interval although both vessels remain patent. 3. Interval decrease in intraperitoneal free fluid. 4. Cholelithiasis. 5. Stable appearance left UVJ stone without left hydroureteronephrosis. 6. Hepatic steatosis. 7. Interval resolution of left pleural effusion with persistent tiny right pleural effusion. 8. Abdominal aortic atherosclerosis. Electronically Signed   By: Kennith Center M.D.   On: 04/03/2016 17:05   Ct Abdomen Pelvis W Contrast  Result Date: 03/26/2016 CLINICAL DATA:  82 year old with pancreatitis. History of periumbilical and lower abdominal pain. Abnormal LFTs. EXAM: CT ABDOMEN AND PELVIS WITH CONTRAST TECHNIQUE: Multidetector CT imaging of the abdomen and pelvis was performed using the standard protocol following bolus administration of intravenous contrast. CONTRAST:  ISOVUE-300 IOPAMIDOL (ISOVUE-300) INJECTION 61% COMPARISON:  CT 03/23/2016.  MRI 03/24/2016 FINDINGS: Lower chest: Pleural-based calcified granuloma at the left lung base. Small bilateral pleural effusions with bibasilar atelectasis. Coronary artery calcifications. Hepatobiliary: Liver is diffusely low density and suggestive for steatosis. Portal venous system is patent. High-density material in the gallbladder could be related to sludge or vicarious contrast excretion from previous CT imaging. Evidence for gallstones. No  significant gallbladder distension. Pancreas: There is diffuse edema and fluid surrounding the entire pancreas. The amount of edema and fluid has markedly increased since 03/23/2016. No large areas of pancreas necrosis. Spleen: Normal appearance of spleen without enlargement. Splenic vein is patent. Adrenals/Urinary Tract: Stable thickening of the left adrenal gland. Normal appearance of the right adrenal gland. There may be 1 or 2 small nonobstructive stones in the right kidney along with small cysts. Probable cyst in the left kidney interpolar region without hydronephrosis. Again noted is a 3 mm stone at the left ureterovesical junction. There is no significant left ureter dilatation. Minimal wall thickening along the right side of bladder is nonspecific. Stomach/Bowel: Large amount of edema and fluid surrounding the duodenum. There is mild dilatation of small bowel loops without obstruction. Increased fluid within the central abdominal mesentery. Colonic diverticulosis without acute colonic inflammation. Normal appearance of the appendix. Vascular/Lymphatic: The abdominal aorta is heavily calcified without aneurysm. There is flow in the major visceral arteries. Iliac arteries are heavily calcified but patent. Splenic vein and portal vein are patent. The portal confluence is patent. However, there is narrowing of the proximal SMV seen on both phases of imaging. This is demonstrated on sequence 3, image 37 and felt to be related to compression rather than thrombus at this time. Renal veins and IVC are patent. No enlarged lymph nodes in the abdomen or pelvis. Reproductive: Prostate is prominent measuring up to 6.2 cm with calcifications. Other: There is a large amount of fluid in the pelvis. There is slightly more dense fluid along the right side of the pelvis suggestive for blood products. In addition, some of the abdominal fluid is dense measuring up to 34 Hounsfield units. Findings are compatible with blood  products. No free air. Musculoskeletal: Disc space loss  at L2-L3. Multilevel disc disease in lumbar spine with bilateral facet arthropathy. IMPRESSION: Progression of the pancreatitis with a large amount of fluid and edema surrounding the pancreas. In addition, some of this fluid is hyperdense and suggestive for blood products. Findings are compatible with hemorrhagic pancreatitis. No large areas of pancreatic necrosis or defined pseudocyst formations. Splenic vein and portal venous system are patent but there is compression and narrowing of the proximal superior mesenteric vein. This vein is at risk for thrombosis. Increased fluid in the pelvis with some blood products. Cholelithiasis. Nonobstructive 3 mm stone at the left ureterovesical junction. Mild bladder wall thickening along the right side is nonspecific. Prostate hypertrophy. Probable right kidney stones. Consider a non emergent Urology consultation. Small bilateral pleural effusions with atelectasis at the lung bases. Hepatic steatosis. These results will be called to the ordering clinician or representative by the Radiologist Assistant, and communication documented in the PACS or zVision Dashboard. Electronically Signed   By: Richarda OverlieAdam  Henn M.D.   On: 03/26/2016 19:34   Ct Abdomen Pelvis W Contrast  Result Date: 03/23/2016 CLINICAL DATA:  Mid abdominal pain and vomiting since yesterday. History of pulmonary fungal infection. EXAM: CT ABDOMEN AND PELVIS WITH CONTRAST TECHNIQUE: Multidetector CT imaging of the abdomen and pelvis was performed using the standard protocol following bolus administration of intravenous contrast. CONTRAST:  15mL ISOVUE-300 IOPAMIDOL (ISOVUE-300) INJECTION 61%, 100mL ISOVUE-300 IOPAMIDOL (ISOVUE-300) INJECTION 61% COMPARISON:  Head CT 03/05/2010. FINDINGS: Lower chest: Bilateral pleural and parenchymal scarring right more than left. Aortic atherosclerosis and coronary artery atherosclerosis. Elevated right hemidiaphragm.  Hepatobiliary: Diffuse fatty change of the liver. Small stones dependent in the gallbladder. No sign of gallbladder inflammation or biliary ductal dilatation. Pancreas: Diffuse fatty change. Surrounding retroperitoneal edema probably indicating early pancreatitis. Spleen: Normal Adrenals/Urinary Tract: Adrenal glands normal. Kidneys normal except for a few tiny cysts. Stomach/Bowel: No acute finding. Diverticulosis without imaging evidence of diverticulitis. Vascular/Lymphatic: Aortic atherosclerosis.  No aneurysm. Reproductive: Enlarged prostate. Other: Small amount of free fluid in the pelvic peritoneal space. Musculoskeletal: Chronic degenerative changes affect the spine. IMPRESSION: Fatty liver. Small gallstones in the gallbladder. Acute pancreatitis with retroperitoneal edema but no pseudocyst or abscess. Constellation of findings suggests gallstone pancreatitis. The differential diagnosis does include duodenitis/peptic ulcer disease, but that is felt less likely. Advanced aortic and coronary artery calcification. Diverticulosis without evidence of diverticulitis. Enlarged prostate. Electronically Signed   By: Paulina FusiMark  Shogry M.D.   On: 03/23/2016 09:53   Mr 3d Recon At Scanner  Result Date: 03/24/2016 CLINICAL DATA:  Abdominal pain and vomiting. Acute pancreatitis on CT. Gallstones. EXAM: MRI ABDOMEN WITHOUT AND WITH CONTRAST (INCLUDING MRCP) TECHNIQUE: Multiplanar multisequence MR imaging of the abdomen was performed both before and after the administration of intravenous contrast. Heavily T2-weighted images of the biliary and pancreatic ducts were obtained, and three-dimensional MRCP images were rendered by post processing. CONTRAST:  17mL MULTIHANCE GADOBENATE DIMEGLUMINE 529 MG/ML IV SOLN COMPARISON:  Ultrasound and CT of 1 day prior. FINDINGS: Portions of exam are mild to moderately motion degraded. Lower chest: Development of small bilateral pleural effusions. Normal heart size. No pericardial effusion.  Hepatobiliary: Marked hepatic steatosis. No focal liver lesion. Multiple gallstones. No specific evidence of acute cholecystitis. No intrahepatic duct dilatation. The common duct is normal, including at 4 mm on image 2/series 9. Compressed in the region of the pancreatic head, likely due to edema. No choledocholithiasis. Pancreas: Progression of moderate pancreatic and peripancreatic edema. The pancreas enhances normally. Small volume peripancreatic fluid, including within  the lesser sac. No well-defined fluid collection. No pancreatic duct dilatation. Spleen:  Normal in size, without focal abnormality. Adrenals/Urinary Tract: Left adrenal thickening. Maintenance of adreniform shape. Normal right adrenal gland. Bilateral small renal cysts. No hydronephrosis. Stomach/Bowel: Normal stomach. The descending and transverse duodenum appear thick walled, specially on image 77/ series 16109. Likely secondary to pancreatitis. Large and small bowel loops are otherwise unremarkable. Vascular/Lymphatic: Aortic and branch vessel atherosclerosis. Patent portal and splenic veins. No retroperitoneal or retrocrural adenopathy. Other:  Development of small volume abdominal ascites. Musculoskeletal: Convex right lumbar spine curvature. IMPRESSION: 1. Progression of moderate pancreatitis.  No acute complication. 2. Cholelithiasis. 3. No biliary duct dilatation or evidence of choledocholithiasis. 4. Development of small volume abdominal ascites and small bilateral pleural effusions. Electronically Signed   By: Jeronimo Greaves M.D.   On: 03/24/2016 21:44   Dg Chest Port 1 View  Result Date: 03/28/2016 CLINICAL DATA:  Acute onset of wheezing.  Initial encounter. EXAM: PORTABLE CHEST 1 VIEW COMPARISON:  Chest radiograph performed 05/31/2013 FINDINGS: The lungs are hypoexpanded, with elevation of the right hemidiaphragm. No significant pleural effusion or pneumothorax is seen. The cardiomediastinal silhouette remains normal in size. A  right PICC is noted ending about the mid to distal SVC. No acute osseous abnormalities are seen. IMPRESSION: Lungs hypoexpanded, with elevation of the right hemidiaphragm. Electronically Signed   By: Roanna Raider M.D.   On: 03/28/2016 01:48   Dg Naso/oro Frutoso Chase Thru Duo-reposition  Result Date: 03/27/2016 INDICATION: Malnutition EXAM: IR NASO/ORO GTUBE THRU DUO - REPOSITION COMPARISON:  Abdominal radiograph earlier same day CONTRAST:  15 cc Isovue FLUOROSCOPY TIME:  13 minutes. 34 seconds. COMPLICATIONS: None immediate PROCEDURE: Under intermittent fluoroscopic guidance, the Dobhoff tube was manipulated in the stomach. The tip would not pass distal to the gastric antrum into the proximal duodenum. The patient tolerated the procedure well without immediate postprocedural complication. FINDINGS: The feeding tube tip is present at the gastric antrum. IMPRESSION: Multiple attempts were made to pass the tube into the duodenum however were unsuccessful. The tube was left with the tip at the gastric antrum. Recommend follow-up KUB to assess for interval passage into the duodenum. These results will be called to the ordering clinician or representative by the Radiologist Assistant, and communication documented in the PACS or zVision Dashboard. Electronically Signed   By: Annia Belt M.D.   On: 03/27/2016 15:28   [name] W/wo Cm/mrcp  Result Date: 03/24/2016 CLINICAL DATA:  Abdominal pain and vomiting. Acute pancreatitis on CT. Gallstones. EXAM: MRI ABDOMEN WITHOUT AND WITH CONTRAST (INCLUDING MRCP) TECHNIQUE: Multiplanar multisequence MR imaging of the abdomen was performed both before and after the administration of intravenous contrast. Heavily T2-weighted images of the biliary and pancreatic ducts were obtained, and three-dimensional MRCP images were rendered by post processing. CONTRAST:  17mL MULTIHANCE GADOBENATE DIMEGLUMINE 529 MG/ML IV SOLN COMPARISON:  Ultrasound and CT of 1 day prior. FINDINGS: Portions  of exam are mild to moderately motion degraded. Lower chest: Development of small bilateral pleural effusions. Normal heart size. No pericardial effusion. Hepatobiliary: Marked hepatic steatosis. No focal liver lesion. Multiple gallstones. No specific evidence of acute cholecystitis. No intrahepatic duct dilatation. The common duct is normal, including at 4 mm on image 2/series 9. Compressed in the region of the pancreatic head, likely due to edema. No choledocholithiasis. Pancreas: Progression of moderate pancreatic and peripancreatic edema. The pancreas enhances normally. Small volume peripancreatic fluid, including within the lesser sac. No well-defined fluid collection. No pancreatic duct dilatation.  Spleen:  Normal in size, without focal abnormality. Adrenals/Urinary Tract: Left adrenal thickening. Maintenance of adreniform shape. Normal right adrenal gland. Bilateral small renal cysts. No hydronephrosis. Stomach/Bowel: Normal stomach. The descending and transverse duodenum appear thick walled, specially on image 77/ series 16109. Likely secondary to pancreatitis. Large and small bowel loops are otherwise unremarkable. Vascular/Lymphatic: Aortic and branch vessel atherosclerosis. Patent portal and splenic veins. No retroperitoneal or retrocrural adenopathy. Other:  Development of small volume abdominal ascites. Musculoskeletal: Convex right lumbar spine curvature. IMPRESSION: 1. Progression of moderate pancreatitis.  No acute complication. 2. Cholelithiasis. 3. No biliary duct dilatation or evidence of choledocholithiasis. 4. Development of small volume abdominal ascites and small bilateral pleural effusions. Electronically Signed   By: Jeronimo Greaves M.D.   On: 03/24/2016 21:44   US Abdomen Limited Ruq  Result Date: 03/23/2016 CLINICAL DATA:  Suspected gallstone pancreatitis. EXAM: US ABDOMEN LIMITED - RIGHT UPPER QUADRANT COMPARISON:  CT abdomen pelvis earlier today. FINDINGS: Gallbladder: Multiple  gallstones, largest of which is 7 mm. Mild increased gallbladder wall thickness up to 2.6 mm. No pericholecystic fluid. Negative sonographic Murphy's sign. Common bile duct: Diameter: Upper limits normal, 6.5 mm. Liver: No focal lesion identified.  Increased echogenicity. IMPRESSION: Cholelithiasis. Mild gallbladder wall thickening but no tenderness to sonographic interrogation or pericholecystic fluid. Common bile duct upper limits normal, 6.5 mm. Hepatic steatosis. Electronically Signed   By: Elsie Stain M.D.   On: 03/23/2016 12:22    DISCHARGE EXAMINATION: Vitals:   04/06/16 0515 04/06/16 1401 04/06/16 2108 04/07/16 0616  BP: 140/65 (!) 133/59 (!) 142/55 131/71  Pulse: 75 77 77 82  Resp: 15 16 16 14   Temp: 98.3 F (36.8 C) 98.3 F (36.8 C) 98.4 F (36.9 C) 98.1 F (36.7 C)  TempSrc: Oral Oral Oral Oral  SpO2: 100% 100% 98% 97%  Weight: 76.9 kg (169 lb 8.5 oz)   74.5 kg (164 lb 3.9 oz)  Height:       General appearance: alert, cooperative, appears stated age and no distress Resp: clear to auscultation bilaterally Cardio: regular rate and rhythm, S1, S2 normal, no murmur, click, rub or gallop GI: soft, non-tender; bowel sounds normal; no masses,  no organomegaly Extremities: extremities normal, atraumatic, no cyanosis or edema   DISPOSITION: Home with health  Discharge Instructions    Call MD for:  difficulty breathing, headache or visual disturbances    Complete by:  As directed    Call MD for:  extreme fatigue    Complete by:  As directed    Call MD for:  persistant dizziness or light-headedness    Complete by:  As directed    Call MD for:  persistant nausea and vomiting    Complete by:  As directed    Call MD for:  severe uncontrolled pain    Complete by:  As directed    Call MD for:  temperature >100.4    Complete by:  As directed    Diet - low sodium heart healthy    Complete by:  As directed    Discharge instructions    Complete by:  As directed    The  gastroenterologists (Dr. Dulce Sellar) office will call you for an appointment. If he don't hear from their office in in a few days, please call them. Take medications as prescribed. Please be sure to follow up with your primary care physician within a week.  You were cared for by a hospitalist during your hospital stay. If you have any questions  about your discharge medications or the care you received while you were in the hospital after you are discharged, you can call the unit and asked to speak with the hospitalist on call if the hospitalist that took care of you is not available. Once you are discharged, your primary care physician will handle any further medical issues. Please note that NO REFILLS for any discharge medications will be authorized once you are discharged, as it is imperative that you return to your primary care physician (or establish a relationship with a primary care physician if you do not have one) for your aftercare needs so that they can reassess your need for medications and monitor your lab values. If you do not have a primary care physician, you can call [phone] for a physician referral.   Increase activity slowly    Complete by:  As directed       ALLERGIES: No Known Allergies   Current Discharge Medication List    START taking these medications   Details  Amino Acids-Protein Hydrolys (FEEDING SUPPLEMENT, PRO-STAT SUGAR FREE 64,) LIQD Place 30 mLs into feeding tube 2 (two) times daily. Qty: 900 mL, Refills: 0    famotidine (PEPCID) 20 MG tablet Take 1 tablet (20 mg total) by mouth 2 (two) times daily. Qty: 60 tablet, Refills: 0    feeding supplement, GLUCERNA SHAKE, (GLUCERNA SHAKE) LIQD Take 237 mLs by mouth 3 (three) times daily between meals. Qty: 90 Can, Refills: 0    lipase/protease/amylase (CREON) 36000 UNITS CPEP capsule Take 1 capsule (36,000 Units total) by mouth 3 (three) times daily before meals. Qty: 90 capsule, Refills: 2    metoCLOPramide (REGLAN)  5 MG tablet Take 1-2 tablets (5-10 mg total) by mouth every 8 (eight) hours as needed (hiccups). Qty: 30 tablet, Refills: 0    metoprolol tartrate (LOPRESSOR) 25 MG tablet Take 1 tablet (25 mg total) by mouth 2 (two) times daily. Qty: 60 tablet, Refills: 0    potassium chloride SA (K-DUR,KLOR-CON) 20 MEQ tablet Take 1 tablet (20 mEq total) by mouth daily. Qty: 30 tablet, Refills: 0    saccharomyces boulardii (FLORASTOR) 250 MG capsule Take 1 capsule (250 mg total) by mouth 2 (two) times daily. Qty: 60 capsule, Refills: 0    traMADol (ULTRAM) 50 MG tablet Take 1 tablet (50 mg total) by mouth every 6 (six) hours as needed for moderate pain. Qty: 30 tablet, Refills: 0      CONTINUE these medications which have NOT CHANGED   Details  lisinopril (PRINIVIL,ZESTRIL) 20 MG tablet Take 40 mg by mouth every morning.     lovastatin (MEVACOR) 20 MG tablet Take 20 mg by mouth daily.    metFORMIN (GLUCOPHAGE-XR) 500 MG 24 hr tablet Take 500 mg by mouth daily with breakfast.    Multiple Vitamin (MULTIVITAMIN PO) Take 1 tablet by mouth every morning. Centrum Silver    Cholecalciferol (VITAMIN D PO) Take by mouth.        STOP taking these medications     aspirin 81 MG tablet          Follow-up Information    NEWMAN,DAVID H, MD. Schedule an appointment as soon as possible for a visit in 4 week(s).   Specialty:  General Surgery Why:  for consultation regarding surgical removal of gallbladder. Contact information: [address] [place] [address] [phone]        Freddy Jaksch, MD .   Specialty:  Gastroenterology Why:  office will call with  appointment Contact information: [address] [place] [address] [phone]        Colin Bangs, MD. Schedule an appointment as soon as possible for a visit in 1 week(s).   Specialty:  Family Medicine Contact information: [address] [place] [address] [phone]         [facility] .   Why:  physical therapy Contact information: [address] [place] [address] [phone]        Inc[facility] - Dme [facility] .   Why:  rolling Dispensing optician information: [address] [place] [address] [phone]           TOTAL DISCHARGE TIME: 35 minutes  [facility]  [facility] Pager [phone]  04/07/2016, 1:03 PM

## 2016-04-07 NOTE — Care Management Important Message (Signed)
Important Message  Patient Details  Name: Colin West MRN: [id number] Date of Birth: [birth year]   Medicare Important Message Given:  Yes    Haskell FlirtJamison, Yavuz Kirby 04/07/2016, 10:28 AMImportant Message  Patient Details  Name: Colin West MRN: [id number] Date of Birth: [birth year]   Medicare Important Message Given:  Yes    Haskell FlirtJamison, Deondra Wigger 04/07/2016, 10:28 AM

## 2016-04-08 LAB — GLUCOSE, CAPILLARY
GLUCOSE-CAPILLARY: 123 mg/dL — AB (ref 65–99)
GLUCOSE-CAPILLARY: 145 mg/dL — AB (ref 65–99)
GLUCOSE-CAPILLARY: 147 mg/dL — AB (ref 65–99)
GLUCOSE-CAPILLARY: 167 mg/dL — AB (ref 65–99)
GLUCOSE-CAPILLARY: 183 mg/dL — AB (ref 65–99)
GLUCOSE-CAPILLARY: 186 mg/dL — AB (ref 65–99)
GLUCOSE-CAPILLARY: 194 mg/dL — AB (ref 65–99)
GLUCOSE-CAPILLARY: 200 mg/dL — AB (ref 65–99)
GLUCOSE-CAPILLARY: 201 mg/dL — AB (ref 65–99)
GLUCOSE-CAPILLARY: 205 mg/dL — AB (ref 65–99)
GLUCOSE-CAPILLARY: 211 mg/dL — AB (ref 65–99)
GLUCOSE-CAPILLARY: 213 mg/dL — AB (ref 65–99)
GLUCOSE-CAPILLARY: 214 mg/dL — AB (ref 65–99)
GLUCOSE-CAPILLARY: 218 mg/dL — AB (ref 65–99)
GLUCOSE-CAPILLARY: 237 mg/dL — AB (ref 65–99)
GLUCOSE-CAPILLARY: 242 mg/dL — AB (ref 65–99)
GLUCOSE-CAPILLARY: 255 mg/dL — AB (ref 65–99)
GLUCOSE-CAPILLARY: 258 mg/dL — AB (ref 65–99)
Glucose-Capillary: 118 mg/dL — ABNORMAL HIGH (ref 65–99)
Glucose-Capillary: 126 mg/dL — ABNORMAL HIGH (ref 65–99)
Glucose-Capillary: 128 mg/dL — ABNORMAL HIGH (ref 65–99)
Glucose-Capillary: 143 mg/dL — ABNORMAL HIGH (ref 65–99)
Glucose-Capillary: 149 mg/dL — ABNORMAL HIGH (ref 65–99)
Glucose-Capillary: 171 mg/dL — ABNORMAL HIGH (ref 65–99)
Glucose-Capillary: 172 mg/dL — ABNORMAL HIGH (ref 65–99)
Glucose-Capillary: 174 mg/dL — ABNORMAL HIGH (ref 65–99)
Glucose-Capillary: 175 mg/dL — ABNORMAL HIGH (ref 65–99)
Glucose-Capillary: 175 mg/dL — ABNORMAL HIGH (ref 65–99)
Glucose-Capillary: 183 mg/dL — ABNORMAL HIGH (ref 65–99)
Glucose-Capillary: 184 mg/dL — ABNORMAL HIGH (ref 65–99)
Glucose-Capillary: 190 mg/dL — ABNORMAL HIGH (ref 65–99)
Glucose-Capillary: 190 mg/dL — ABNORMAL HIGH (ref 65–99)
Glucose-Capillary: 193 mg/dL — ABNORMAL HIGH (ref 65–99)
Glucose-Capillary: 193 mg/dL — ABNORMAL HIGH (ref 65–99)
Glucose-Capillary: 197 mg/dL — ABNORMAL HIGH (ref 65–99)
Glucose-Capillary: 204 mg/dL — ABNORMAL HIGH (ref 65–99)
Glucose-Capillary: 205 mg/dL — ABNORMAL HIGH (ref 65–99)
Glucose-Capillary: 226 mg/dL — ABNORMAL HIGH (ref 65–99)
Glucose-Capillary: 233 mg/dL — ABNORMAL HIGH (ref 65–99)
Glucose-Capillary: 240 mg/dL — ABNORMAL HIGH (ref 65–99)

## 2016-04-22 ENCOUNTER — Encounter (HOSPITAL_COMMUNITY): Payer: Self-pay | Admitting: Emergency Medicine

## 2016-04-22 ENCOUNTER — Inpatient Hospital Stay (HOSPITAL_COMMUNITY)
Admission: EM | Admit: 2016-04-22 | Discharge: 2016-04-28 | DRG: 871 | Disposition: A | Discharge status: Skilled Nursing Facility | Payer: Medicare Other | Attending: Internal Medicine | Admitting: Internal Medicine

## 2016-04-22 ENCOUNTER — Emergency Department (HOSPITAL_COMMUNITY): Payer: Medicare Other

## 2016-04-22 DIAGNOSIS — K8020 Calculus of gallbladder without cholecystitis without obstruction: Secondary | ICD-10-CM | POA: Diagnosis present

## 2016-04-22 DIAGNOSIS — Z8719 Personal history of other diseases of the digestive system: Secondary | ICD-10-CM | POA: Diagnosis not present

## 2016-04-22 DIAGNOSIS — E119 Type 2 diabetes mellitus without complications: Secondary | ICD-10-CM | POA: Diagnosis present

## 2016-04-22 DIAGNOSIS — I471 Supraventricular tachycardia: Secondary | ICD-10-CM | POA: Diagnosis present

## 2016-04-22 DIAGNOSIS — R066 Hiccough: Secondary | ICD-10-CM | POA: Diagnosis not present

## 2016-04-22 DIAGNOSIS — N179 Acute kidney failure, unspecified: Secondary | ICD-10-CM | POA: Diagnosis present

## 2016-04-22 DIAGNOSIS — I110 Hypertensive heart disease with heart failure: Secondary | ICD-10-CM | POA: Diagnosis present

## 2016-04-22 DIAGNOSIS — D72829 Elevated white blood cell count, unspecified: Secondary | ICD-10-CM | POA: Diagnosis present

## 2016-04-22 DIAGNOSIS — Z79899 Other long term (current) drug therapy: Secondary | ICD-10-CM

## 2016-04-22 DIAGNOSIS — R748 Abnormal levels of other serum enzymes: Secondary | ICD-10-CM | POA: Diagnosis not present

## 2016-04-22 DIAGNOSIS — I493 Ventricular premature depolarization: Secondary | ICD-10-CM | POA: Diagnosis present

## 2016-04-22 DIAGNOSIS — R7989 Other specified abnormal findings of blood chemistry: Secondary | ICD-10-CM

## 2016-04-22 DIAGNOSIS — I10 Essential (primary) hypertension: Secondary | ICD-10-CM | POA: Diagnosis present

## 2016-04-22 DIAGNOSIS — I420 Dilated cardiomyopathy: Secondary | ICD-10-CM | POA: Diagnosis present

## 2016-04-22 DIAGNOSIS — R0602 Shortness of breath: Secondary | ICD-10-CM

## 2016-04-22 DIAGNOSIS — R000 Tachycardia, unspecified: Secondary | ICD-10-CM | POA: Diagnosis present

## 2016-04-22 DIAGNOSIS — K8070 Calculus of gallbladder and bile duct without cholecystitis without obstruction: Secondary | ICD-10-CM | POA: Diagnosis present

## 2016-04-22 DIAGNOSIS — E785 Hyperlipidemia, unspecified: Secondary | ICD-10-CM | POA: Diagnosis present

## 2016-04-22 DIAGNOSIS — K851 Biliary acute pancreatitis without necrosis or infection: Secondary | ICD-10-CM | POA: Diagnosis present

## 2016-04-22 DIAGNOSIS — R6510 Systemic inflammatory response syndrome (SIRS) of non-infectious origin without acute organ dysfunction: Secondary | ICD-10-CM | POA: Insufficient documentation

## 2016-04-22 DIAGNOSIS — Z7984 Long term (current) use of oral hypoglycemic drugs: Secondary | ICD-10-CM | POA: Diagnosis not present

## 2016-04-22 DIAGNOSIS — R059 Cough, unspecified: Secondary | ICD-10-CM

## 2016-04-22 DIAGNOSIS — I9589 Other hypotension: Secondary | ICD-10-CM | POA: Diagnosis not present

## 2016-04-22 DIAGNOSIS — Z87891 Personal history of nicotine dependence: Secondary | ICD-10-CM

## 2016-04-22 DIAGNOSIS — Z66 Do not resuscitate: Secondary | ICD-10-CM | POA: Diagnosis present

## 2016-04-22 DIAGNOSIS — Z902 Acquired absence of lung [part of]: Secondary | ICD-10-CM

## 2016-04-22 DIAGNOSIS — R9431 Abnormal electrocardiogram [ECG] [EKG]: Secondary | ICD-10-CM | POA: Diagnosis not present

## 2016-04-22 DIAGNOSIS — R6520 Severe sepsis without septic shock: Secondary | ICD-10-CM | POA: Diagnosis present

## 2016-04-22 DIAGNOSIS — R778 Other specified abnormalities of plasma proteins: Secondary | ICD-10-CM | POA: Diagnosis present

## 2016-04-22 DIAGNOSIS — I9581 Postprocedural hypotension: Secondary | ICD-10-CM | POA: Diagnosis not present

## 2016-04-22 DIAGNOSIS — I959 Hypotension, unspecified: Secondary | ICD-10-CM | POA: Diagnosis present

## 2016-04-22 DIAGNOSIS — N390 Urinary tract infection, site not specified: Secondary | ICD-10-CM | POA: Diagnosis present

## 2016-04-22 DIAGNOSIS — R05 Cough: Secondary | ICD-10-CM

## 2016-04-22 DIAGNOSIS — A419 Sepsis, unspecified organism: Secondary | ICD-10-CM | POA: Diagnosis present

## 2016-04-22 DIAGNOSIS — I5030 Unspecified diastolic (congestive) heart failure: Secondary | ICD-10-CM | POA: Diagnosis present

## 2016-04-22 DIAGNOSIS — B952 Enterococcus as the cause of diseases classified elsewhere: Secondary | ICD-10-CM | POA: Diagnosis present

## 2016-04-22 HISTORY — DX: Occlusion and stenosis of unspecified carotid artery: I65.29

## 2016-04-22 LAB — URINE MICROSCOPIC-ADD ON: RBC / HPF: NONE SEEN RBC/hpf (ref 0–5)

## 2016-04-22 LAB — URINALYSIS, ROUTINE W REFLEX MICROSCOPIC
Glucose, UA: 100 mg/dL — AB
Hgb urine dipstick: NEGATIVE
Ketones, ur: NEGATIVE mg/dL
NITRITE: NEGATIVE
Protein, ur: 100 mg/dL — AB
SPECIFIC GRAVITY, URINE: 1.023 (ref 1.005–1.030)
pH: 6 (ref 5.0–8.0)

## 2016-04-22 LAB — CBC WITH DIFFERENTIAL/PLATELET
BASOS ABS: 0 10*3/uL (ref 0.0–0.1)
Basophils Relative: 0 %
EOS ABS: 0 10*3/uL (ref 0.0–0.7)
Eosinophils Relative: 0 %
HCT: 44.3 % (ref 39.0–52.0)
Hemoglobin: 15.3 g/dL (ref 13.0–17.0)
LYMPHS ABS: 1.2 10*3/uL (ref 0.7–4.0)
Lymphocytes Relative: 4 %
MCH: 33.3 pg (ref 26.0–34.0)
MCHC: 34.5 g/dL (ref 30.0–36.0)
MCV: 96.5 fL (ref 78.0–100.0)
MONO ABS: 2.1 10*3/uL — AB (ref 0.1–1.0)
Monocytes Relative: 7 %
NEUTROS ABS: 27.3 10*3/uL — AB (ref 1.7–7.7)
Neutrophils Relative %: 89 %
PLATELETS: 266 10*3/uL (ref 150–400)
RBC: 4.59 MIL/uL (ref 4.22–5.81)
RDW: 13.5 % (ref 11.5–15.5)
WBC: 30.6 10*3/uL — ABNORMAL HIGH (ref 4.0–10.5)

## 2016-04-22 LAB — COMPREHENSIVE METABOLIC PANEL
ALK PHOS: 70 U/L (ref 38–126)
ALT: 34 U/L (ref 17–63)
ANION GAP: 10 (ref 5–15)
AST: 26 U/L (ref 15–41)
Albumin: 3.6 g/dL (ref 3.5–5.0)
BUN: 45 mg/dL — ABNORMAL HIGH (ref 6–20)
CALCIUM: 9.7 mg/dL (ref 8.9–10.3)
CO2: 23 mmol/L (ref 22–32)
CREATININE: 2.03 mg/dL — AB (ref 0.61–1.24)
Chloride: 98 mmol/L — ABNORMAL LOW (ref 101–111)
GFR, EST AFRICAN AMERICAN: 34 mL/min — AB (ref >60)
GFR, EST NON AFRICAN AMERICAN: 29 mL/min — AB (ref >60)
Glucose, Bld: 249 mg/dL — ABNORMAL HIGH (ref 65–99)
Potassium: 4.9 mmol/L (ref 3.5–5.1)
Sodium: 131 mmol/L — ABNORMAL LOW (ref 135–145)
Total Bilirubin: 2.1 mg/dL — ABNORMAL HIGH (ref 0.3–1.2)
Total Protein: 7.5 g/dL (ref 6.5–8.1)

## 2016-04-22 LAB — I-STAT CG4 LACTIC ACID, ED
LACTIC ACID, VENOUS: 3.14 mmol/L — AB (ref 0.5–1.9)
Lactic Acid, Venous: 3.05 mmol/L (ref 0.5–1.9)

## 2016-04-22 LAB — LIPASE, BLOOD: Lipase: 22 U/L (ref 11–51)

## 2016-04-22 LAB — AMYLASE: Amylase: 30 U/L (ref 28–100)

## 2016-04-22 LAB — CBG MONITORING, ED: GLUCOSE-CAPILLARY: 176 mg/dL — AB (ref 65–99)

## 2016-04-22 MED ORDER — ONDANSETRON HCL 4 MG PO TABS: 4.0000 mg | ORAL_TABLET | Freq: Four times a day (QID) | ORAL | Status: DC | PRN

## 2016-04-22 MED ORDER — ONDANSETRON HCL 4 MG/2ML IJ SOLN
4.0000 mg | Freq: Four times a day (QID) | INTRAMUSCULAR | Status: DC | PRN
  Administered 2016-04-23: 4 mg via INTRAVENOUS
  Filled 2016-04-22: qty 2

## 2016-04-22 MED ORDER — SODIUM CHLORIDE 0.9 % IV BOLUS (SEPSIS)
1000.0000 mL | Freq: Once | INTRAVENOUS | Status: AC
  Administered 2016-04-22: 1000 mL via INTRAVENOUS

## 2016-04-22 MED ORDER — PIPERACILLIN-TAZOBACTAM 3.375 G IVPB
3.3750 g | Freq: Three times a day (TID) | INTRAVENOUS | Status: DC
  Administered 2016-04-22 – 2016-04-25 (×8): 3.375 g via INTRAVENOUS
  Filled 2016-04-22 (×8): qty 50

## 2016-04-22 MED ORDER — VANCOMYCIN HCL IN DEXTROSE 750-5 MG/150ML-% IV SOLN: 750.0000 mg | INTRAVENOUS | Status: DC

## 2016-04-22 MED ORDER — PIPERACILLIN-TAZOBACTAM 3.375 G IVPB 30 MIN
3.3750 g | INTRAVENOUS | Status: AC
  Administered 2016-04-22: 3.375 g via INTRAVENOUS
  Filled 2016-04-22: qty 50

## 2016-04-22 MED ORDER — INSULIN ASPART 100 UNIT/ML ~~LOC~~ SOLN
0.0000 [IU] | SUBCUTANEOUS | Status: DC
  Administered 2016-04-22: 21:00:00 via SUBCUTANEOUS
  Administered 2016-04-24 – 2016-04-25 (×2): 1 [IU] via SUBCUTANEOUS
  Administered 2016-04-25 (×2): 2 [IU] via SUBCUTANEOUS
  Administered 2016-04-25 – 2016-04-26 (×3): 1 [IU] via SUBCUTANEOUS
  Administered 2016-04-26: 2 [IU] via SUBCUTANEOUS
  Administered 2016-04-26 (×2): 1 [IU] via SUBCUTANEOUS
  Administered 2016-04-26: 2 [IU] via SUBCUTANEOUS
  Administered 2016-04-27: 1 [IU] via SUBCUTANEOUS
  Administered 2016-04-27: 3 [IU] via SUBCUTANEOUS
  Administered 2016-04-27 (×2): 1 [IU] via SUBCUTANEOUS
  Administered 2016-04-27 (×2): 3 [IU] via SUBCUTANEOUS
  Administered 2016-04-28 (×2): 1 [IU] via SUBCUTANEOUS
  Administered 2016-04-28 (×2): 2 [IU] via SUBCUTANEOUS
  Administered 2016-04-28: 1 [IU] via SUBCUTANEOUS
  Filled 2016-04-22: qty 1

## 2016-04-22 MED ORDER — VANCOMYCIN HCL IN DEXTROSE 1-5 GM/200ML-% IV SOLN
1000.0000 mg | INTRAVENOUS | Status: AC
  Administered 2016-04-22: 1000 mg via INTRAVENOUS
  Filled 2016-04-22: qty 200

## 2016-04-22 MED ORDER — VANCOMYCIN HCL IN DEXTROSE 750-5 MG/150ML-% IV SOLN: 750.0000 mg | Freq: Two times a day (BID) | INTRAVENOUS | Status: DC

## 2016-04-22 MED ORDER — SODIUM CHLORIDE 0.45 % IV SOLN
INTRAVENOUS | Status: DC
  Administered 2016-04-23: via INTRAVENOUS

## 2016-04-22 MED ORDER — PIPERACILLIN-TAZOBACTAM 3.375 G IVPB 30 MIN: 3.3750 g | Freq: Once | INTRAVENOUS | Status: DC

## 2016-04-22 MED ORDER — VANCOMYCIN HCL IN DEXTROSE 1-5 GM/200ML-% IV SOLN: 1000.0000 mg | Freq: Once | INTRAVENOUS | Status: DC

## 2016-04-22 MED ORDER — IOPAMIDOL (ISOVUE-300) INJECTION 61%
15.0000 mL | Freq: Once | INTRAVENOUS | Status: AC | PRN
  Administered 2016-04-22: 15 mL via ORAL

## 2016-04-22 MED ORDER — HYDROMORPHONE HCL 1 MG/ML IJ SOLN
1.0000 mg | INTRAMUSCULAR | Status: DC | PRN
  Administered 2016-04-26: 1 mg via INTRAVENOUS
  Administered 2016-04-27: 2 mg via INTRAVENOUS
  Filled 2016-04-22: qty 2
  Filled 2016-04-22: qty 1

## 2016-04-22 MED ORDER — ACETAMINOPHEN 325 MG PO TABS
650.0000 mg | ORAL_TABLET | Freq: Once | ORAL | Status: AC
  Administered 2016-04-22: 650 mg via ORAL
  Filled 2016-04-22: qty 2

## 2016-04-22 MED ORDER — SODIUM CHLORIDE 0.9 % IV BOLUS (SEPSIS)
250.0000 mL | Freq: Once | INTRAVENOUS | Status: AC
  Administered 2016-04-22: 250 mL via INTRAVENOUS

## 2016-04-22 NOTE — Progress Notes (Addendum)
Pharmacy Antibiotic Note  Colin West is a 82 y.o. male admitted on 04/22/2016 from PCP with weakness, recent hemorrhagic pancreatitis (admitted 10/8-10/23), and code sepsis.  Pharmacy has been consulted for Vancomycin and Zosyn dosing.  SCr acutely elevated at 2.03 (04/07/16 SCr 0.69), CrCl ~ 29 ml/min Lactic acid 3.14 WBC 30.6  Plan:  Zosyn 3.375g IV Q8H infused over 4hrs.  Vancomycin 1g IV stat, then 750 mg IV q24h.  Measure Vanc trough at steady state.  Follow up renal fxn, culture results, and clinical course.   Height: 5\' 9"  (175.3 cm) Weight: 155 lb (70.3 kg) IBW/kg (Calculated) : 70.7  Temp (24hrs), Avg:97.9 F (36.6 C), Min:97.9 F (36.6 C), Max:97.9 F (36.6 C)   Recent Labs Lab 04/22/16 1554 04/22/16 1608  WBC 30.6*  --   CREATININE 2.03*  --   LATICACIDVEN  --  3.14*    Estimated Creatinine Clearance: 28.9 mL/min (by C-G formula based on SCr of 2.03 mg/dL (H)).    No Known Allergies  Antimicrobials this admission: 11/7 Zosyn >>  11/7 Vancomycin >>   Dose adjustments this admission:   Microbiology results: 11/7 BCx: sent 11/7 UCx: sent   Thank you for allowing pharmacy to be a part of this patient's care.  Lynann Beaverhristine Antwine Agosto PharmD, BCPS Pager [phone] 04/22/2016 3:22 PM

## 2016-04-22 NOTE — Progress Notes (Signed)
EDCM Sp[facility]an [facility] to inform her of patient's admission and will follow for services.

## 2016-04-22 NOTE — ED Triage Notes (Signed)
Pt sent from PCP for gl weakness, pt sts was hospitalized 2 weeks ago for pancreatitis. Pt is hypotensive in triage, HR 138. Pt denies pain. Reports coughing .

## 2016-04-22 NOTE — ED Notes (Signed)
Abnormal lab result MD Isaacs have been made aware  

## 2016-04-22 NOTE — ED Notes (Signed)
Pt drank 3/4 of contrast when vomited it all up.  PA aware.

## 2016-04-22 NOTE — ED Notes (Signed)
Pt returned to room 7

## 2016-04-22 NOTE — ED Notes (Signed)
Inquired about pt's bed placement several times but no beds are available.  Released signed and held orders because I am unsure when he will be admitted.

## 2016-04-22 NOTE — ED Notes (Signed)
Nurse is in the room she is going to start an IV and try and collect the labs

## 2016-04-22 NOTE — Progress Notes (Signed)
[facility]  Patient Status: Active (receiving services up to time of hospitalization)  [facility] is providing the following services: RN, PT  If patient discharges after hours, please call [phone].   Colin MachoSusan West Colin West 04/22/2016, 4:57 PM

## 2016-04-22 NOTE — ED Notes (Signed)
Pt in xray

## 2016-04-22 NOTE — H&P (Addendum)
[facility] History and Physical  Colin HasGraham N Arrona ZOX:[id number] DOB: June 24, 1933 DOA: 04/22/2016  Referring physician: Dr. Erma HeritageIsaacs, ED Colin [facility]WL PCP: Verl BangsADIONTCHENKO, ALEXEI, MD   Chief Complaint: Gen weakness, recent acute pancreatitis  HPI: Colin West is a 82 y.o. male with history of HTN o/w healthy admitted here 2 weeks ago with acute pancreatitis and gallstones, felt to be gallstone pancreatitis.  He had some hemorrhagic changes by CT, recovered slowly, was Rx with Imipenem, NG tube feeds.  WBC high, eventually improved and pt was dc'd on 04/07/16.  Plan was to have GB removed at a later date.  Pt here now with gen'd weakness for 3-4 days.  This am per family was very pale, went to PCP who sent him "right to the emergency room".  In ED patient was hypotensive with tachycardia, temp 100.8, WBC 30k, creat 2.0, BP's 90's, RR 24.  He has rec'd 3 L IVF bolus and according to family looks a lot better.  CXR is clear, CT abd shows significant pancreatic inflammation, no new necrosis/ pseudocyst.  UA negative.  No abd pain per patient, who is somewhat stoic.    Denies recent fevers, CP/ SOB, cough, abd pain.  +N/V for 1-2 days.  No diarrhea.  No dysuria. Was severely weak, couldn't get OOB is main c/o.    Patient [place] up in [place], his parents were farmers.  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  Had 5 children, is divorced.  No hx tob/ etoh abuse.  [place], drives and takes care of himself prior to recent admission.     Old chart: Oct 8- 23, 2017 > hx HTN/ depression/ renal stones w abd pain, new pancreatitis due to gallstones most likely. Slow to recover, some hemorrhagic pancreatitis. Rx Imipenem, NGT, tube feeds.  2011 > RUL nodule, underwent R VATS with wedge resection of RUL nodule. 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 Started on itraconazole po x 12 mos, to f/u w ID.       Past Medical History  Past Medical History:  Diagnosis Date  . Anxiety    . Carotid artery occlusion   . Depression    denies  . Diabetes mellitus    denies  . History of kidney stones   . Hyperlipidemia   . Hypertension    Past Surgical History  Past Surgical History:  Procedure Laterality Date  . ELBOW ARTHROSCOPY Right   . INGUINAL HERNIA REPAIR Left Sept 2006   Dr Corliss Skainssuei  . INGUINAL HERNIA REPAIR Bilateral 06/01/2013   Procedure: LAPAROSCOPIC EXPLORATION BILATERAL INGUINAL HERNIA REPAIR;  Surgeon: Ardeth SportsmanSteven C. Gross, MD;  Location: [facility] OR;  Service: General;  Laterality: Bilateral;  Left Femoral, Left Recurrent inguinal, Right Inguinal  . INSERTION OF MESH Bilateral 06/01/2013   Procedure: INSERTION OF MESH;  Surgeon: Ardeth SportsmanSteven C. Gross, MD;  Location: [facility] OR;  Service: General;  Laterality: Bilateral;  Left Femoral, Left Recurrent inguinal, Right Inguinal  . LAPAROSCOPIC LYSIS OF ADHESIONS Bilateral 06/01/2013   Procedure: LAPAROSCOPIC LYSIS OF ADHESIONS;  Surgeon: Ardeth SportsmanSteven C. Gross, MD;  Location: [facility] OR;  Service: General;  Laterality: Bilateral;  . LUNG REMOVAL, PARTIAL  Sept. 2011    Dr. Edwyna ShellBurney  . ROTATOR CUFF REPAIR     Bilateral repair   Family History  Family History  Problem Relation Age of Onset  . Heart disease Brother     Heart Disease before age 80  . Heart attack Brother    Social  History  reports that he quit smoking about 42 years ago. He has never used smokeless tobacco. He reports that he does not drink alcohol or use drugs. Allergies No Known Allergies Home medications Prior to Admission medications   Medication Sig Start Date End Date Taking? Authorizing Provider  famotidine (PEPCID) 20 MG tablet Take 1 tablet (20 mg total) by mouth 2 (two) times daily. 04/07/16  Yes Osvaldo Shipper, MD  feeding supplement, GLUCERNA SHAKE, (GLUCERNA SHAKE) LIQD Take 237 mLs by mouth 3 (three) times daily between meals. 04/07/16  Yes Osvaldo Shipper, MD  lipase/protease/amylase (CREON) 36000 UNITS CPEP capsule Take 1 capsule (36,000 Units total) by mouth  3 (three) times daily before meals. 04/07/16  Yes Osvaldo Shipper, MD  lisinopril-hydrochlorothiazide (PRINZIDE,ZESTORETIC) 20-25 MG tablet Take 2 tablets by mouth daily.   Yes Historical Provider, MD  lovastatin (MEVACOR) 20 MG tablet Take 20 mg by mouth daily.   Yes Historical Provider, MD  metFORMIN (GLUCOPHAGE-XR) 500 MG 24 hr tablet Take 500 mg by mouth daily with breakfast.   Yes Historical Provider, MD  metoCLOPramide (REGLAN) 5 MG tablet Take 5-10 mg by mouth every 8 (eight) hours as needed (for hiccups).   Yes Historical Provider, MD  metoprolol tartrate (LOPRESSOR) 25 MG tablet Take 1 tablet (25 mg total) by mouth 2 (two) times daily. 04/07/16  Yes Osvaldo Shipper, MD  potassium chloride SA (K-DUR,KLOR-CON) 20 MEQ tablet Take 1 tablet (20 mEq total) by mouth daily. 04/07/16  Yes Osvaldo Shipper, MD  traMADol (ULTRAM) 50 MG tablet Take 1 tablet (50 mg total) by mouth every 6 (six) hours as needed for moderate pain. 04/07/16  Yes Osvaldo Shipper, MD   Liver Function Tests  Recent Labs Lab 04/22/16 1554  AST 26  ALT 34  ALKPHOS 70  BILITOT 2.1*  PROT 7.5  ALBUMIN 3.6   No results for input(s): LIPASE, AMYLASE in the last 168 hours. CBC  Recent Labs Lab 04/22/16 1554  WBC 30.6*  NEUTROABS 27.3*  HGB 15.3  HCT 44.3  MCV 96.5  PLT 266   Basic Metabolic Panel  Recent Labs Lab 04/22/16 1554  NA 131*  K 4.9  CL 98*  CO2 23  GLUCOSE 249*  BUN 45*  CREATININE 2.03*  CALCIUM 9.7     Vitals:   04/22/16 1630 04/22/16 1700 04/22/16 1715 04/22/16 1800  BP: 109/57 127/56 130/63 132/78  Pulse: 102 93 88 (!) 122  Resp: 24 14 19  (!) 31  Temp:  100.8 F (38.2 C)    TempSrc:  Rectal    SpO2: (!) 89% 95% 92% 94%  Weight:      Height:       Exam: Gen alert, no distress, elderly No rash, cyanosis or gangrene Sclera anicteric, throat clear and slightly dry  No jvd or bruits Chest clear bilat RRR no MRG, tachy Abd soft ntnd no mass or ascites, BS diminished GU normal  male MS no joint effusions or deformity Ext no LE edema / no wounds or ulcers Neuro is alert, Ox 3 , nf  Na 131  K 4.9  CO2 23   BUN 45  Cr 2.03  Alb 3.6  Tbili 2.1 WBC 30k  Hb 15  plt 266 Lact acid 3.14, 3.03  EKG (independ reviewed) > sinus tachy, no acute changes, 122 CXR - clear UA - neg   Assessment: 1. Sepsis syndrome w ^WBC/ hypotension / tachycardia - sepsis protocol initiated.  Not sure source, most likely this is recurrent pancreatitis;  was here w presumed gallstone panc 2-3 wks ago and GB was not removed due to severity of acute pancreatitis at the time.  Looking better after IVF's (3L) and IV abx.  Plan admit SDU, IVF"s, IV abx , supportive care, NPO, lipase/ amylase. 2. Vol depletion - improving 3. Hypotension - BP's better, no need for pressors 4. Hist HTN - hold meds 5. AKI - due to #1, making urine in ED now 6. DM2 on oral agent, plan SSI q 4h  Plan - as above     Janiya Millirons D [facility] Pager [phone]   If 7PM-7AM, please contact night-coverage [email or web address] Password [email or web address] 04/22/2016, 6:50 PM

## 2016-04-22 NOTE — ED Provider Notes (Signed)
[facility]-EMERGENCY DEPT Provider Note   CSN: [id number] Arrival date & time: 04/22/16  1429     History   Chief Complaint Chief Complaint  Patient presents with  . Weakness    HPI Colin West is a 82 y.o. male.  The history is provided by the patient and medical records. No language interpreter was used.  Weakness  Primary symptoms include no dizziness. Pertinent negatives include no shortness of breath, no vomiting and no headaches.   Colin West is a 82 y.o. male  with a PMH of pancreatitis, HTN, carotid artery occlusion Who was sent to the emergency department from his primary care physician just prior to arrival. Patient went to PCP for generalized weakness for the last 3-4 days. He also reported not feeling well and feeling nauseous. He was noted to be hypotensive and tachycardic, therefore was sent to ED for further evaluation. Patient was recently admitted on 10/08 for hemorrhagic pancreatitis which was thought to be precipitated by gallstones. Endorses weakness, decreased appetite and nausea. Denies fever, abdominal pain, vomiting, diarrhea, constipation, blood in stool, chest pain, shortness of breath.   Past Medical History:  Diagnosis Date  . Anxiety   . Carotid artery occlusion   . Depression    denies  . Diabetes mellitus    denies  . History of kidney stones   . Hyperlipidemia   . Hypertension     Patient Active Problem List   Diagnosis Date Noted  . Severe sepsis (HCC) 04/22/2016  . Leukocytosis 04/22/2016  . Hypotension 04/22/2016  . Sinus tachycardia 04/22/2016  . History of acute pancreatitis 04/22/2016  . Cholelithiasis   . Malnutrition of moderate degree 03/28/2016  . Hyperbilirubinemia 03/26/2016  . Abdominal pain 03/23/2016  . Acute pancreatitis 03/23/2016  . Femoral hernia - left - s/p lap repair 06/01/2013 06/01/2013  . Bilateral inguinal hernia (BIH) s/p lap repair 06/01/2013 05/30/2013  . Occlusion and stenosis of carotid artery  without mention of cerebral infarction 04/01/2012  . Hypertrophy of prostate with urinary obstruction and other lower urinary tract symptoms (LUTS) 05/30/2010  . CRYPTOCOCCOSIS 03/27/2010  . Essential hypertension 03/27/2010  . HYDROCELE 03/27/2010    Past Surgical History:  Procedure Laterality Date  . ELBOW ARTHROSCOPY Right   . INGUINAL HERNIA REPAIR Left Sept 2006   Dr Corliss Skains  . INGUINAL HERNIA REPAIR Bilateral 06/01/2013   Procedure: LAPAROSCOPIC EXPLORATION BILATERAL INGUINAL HERNIA REPAIR;  Surgeon: Ardeth Sportsman, MD;  Location: [facility] OR;  Service: General;  Laterality: Bilateral;  Left Femoral, Left Recurrent inguinal, Right Inguinal  . INSERTION OF MESH Bilateral 06/01/2013   Procedure: INSERTION OF MESH;  Surgeon: Ardeth Sportsman, MD;  Location: [facility] OR;  Service: General;  Laterality: Bilateral;  Left Femoral, Left Recurrent inguinal, Right Inguinal  . LAPAROSCOPIC LYSIS OF ADHESIONS Bilateral 06/01/2013   Procedure: LAPAROSCOPIC LYSIS OF ADHESIONS;  Surgeon: Ardeth Sportsman, MD;  Location: [facility] OR;  Service: General;  Laterality: Bilateral;  . LUNG REMOVAL, PARTIAL  Sept. 2011    Dr. Edwyna Shell  . ROTATOR CUFF REPAIR     Bilateral repair       Home Medications    Prior to Admission medications   Medication Sig Start Date End Date Taking? Authorizing Provider  famotidine (PEPCID) 20 MG tablet Take 1 tablet (20 mg total) by mouth 2 (two) times daily. 04/07/16  Yes Osvaldo Shipper, MD  feeding supplement, GLUCERNA SHAKE, (GLUCERNA SHAKE) LIQD Take 237 mLs by mouth 3 (three) times daily between meals. 04/07/16  Yes Osvaldo ShipperGokul Krishnan, MD  lipase/protease/amylase (CREON) 36000 UNITS CPEP capsule Take 1 capsule (36,000 Units total) by mouth 3 (three) times daily before meals. 04/07/16  Yes Osvaldo ShipperGokul Krishnan, MD  lisinopril-hydrochlorothiazide (PRINZIDE,ZESTORETIC) 20-25 MG tablet Take 2 tablets by mouth daily.   Yes Historical Provider, MD  lovastatin (MEVACOR) 20 MG tablet Take 20 mg by mouth  daily.   Yes Historical Provider, MD  metFORMIN (GLUCOPHAGE-XR) 500 MG 24 hr tablet Take 500 mg by mouth daily with breakfast.   Yes Historical Provider, MD  metoCLOPramide (REGLAN) 5 MG tablet Take 5-10 mg by mouth every 8 (eight) hours as needed (for hiccups).   Yes Historical Provider, MD  metoprolol tartrate (LOPRESSOR) 25 MG tablet Take 1 tablet (25 mg total) by mouth 2 (two) times daily. 04/07/16  Yes Osvaldo ShipperGokul Krishnan, MD  potassium chloride SA (K-DUR,KLOR-CON) 20 MEQ tablet Take 1 tablet (20 mEq total) by mouth daily. 04/07/16  Yes Osvaldo ShipperGokul Krishnan, MD  traMADol (ULTRAM) 50 MG tablet Take 1 tablet (50 mg total) by mouth every 6 (six) hours as needed for moderate pain. 04/07/16  Yes Osvaldo ShipperGokul Krishnan, MD    Family History Family History  Problem Relation Age of Onset  . Heart disease Brother     Heart Disease before age 80  . Heart attack Brother     Social History Social History  Substance Use Topics  . Smoking status: Former Smoker    Quit date: 05/31/1973  . Smokeless tobacco: Never Used  . Alcohol use No     Allergies   Patient has no known allergies.   Review of Systems Review of Systems  Constitutional: Positive for appetite change and fatigue. Negative for chills and fever.  HENT: Negative for congestion.   Eyes: Negative for visual disturbance.  Respiratory: Negative for shortness of breath.   Cardiovascular: Negative.   Gastrointestinal: Positive for nausea. Negative for abdominal pain, blood in stool, constipation, diarrhea and vomiting.  Genitourinary: Negative for dysuria.  Musculoskeletal: Negative for back pain.  Skin: Negative for pallor.  Neurological: Positive for weakness. Negative for dizziness and headaches.     Physical Exam Updated Vital Signs BP 131/80 (BP Location: Left Arm)   Pulse 106   Temp 99.9 F (37.7 C) (Rectal)   Resp (!) 27   Ht 5\' 9"  (1.753 m)   Wt 70.3 kg   SpO2 97%   BMI 22.89 kg/m   Physical Exam  Constitutional: He is  oriented to person, place, and time. He appears well-developed and well-nourished.  Ill-appearing  HENT:  Head: Normocephalic and atraumatic.  Cardiovascular: Normal heart sounds and intact distal pulses.  Exam reveals no friction rub.   No murmur heard. Tachycardic but regular.  Pulmonary/Chest: Effort normal and breath sounds normal. No respiratory distress. He has no wheezes. He has no rales. He exhibits no tenderness.  Abdominal: Soft. Bowel sounds are normal. He exhibits no distension. There is no tenderness.  Musculoskeletal: He exhibits no edema.  Neurological: He is alert and oriented to person, place, and time.  Skin: Skin is warm and dry.  Nursing note and vitals reviewed.    ED Treatments / Results  Labs (all labs ordered are listed, but only abnormal results are displayed) Labs Reviewed  COMPREHENSIVE METABOLIC PANEL - Abnormal; Notable for the following:       Result Value   Sodium 131 (*)    Chloride 98 (*)    Glucose, Bld 249 (*)    BUN 45 (*)    Creatinine,  Ser 2.03 (*)    Total Bilirubin 2.1 (*)    GFR calc non Af Amer 29 (*)    GFR calc Af Amer 34 (*)    All other components within normal limits  CBC WITH DIFFERENTIAL/PLATELET - Abnormal; Notable for the following:    WBC 30.6 (*)    Neutro Abs 27.3 (*)    Monocytes Absolute 2.1 (*)    All other components within normal limits  URINALYSIS, ROUTINE W REFLEX MICROSCOPIC (NOT AT [facility]) - Abnormal; Notable for the following:    Color, Urine ORANGE (*)    APPearance CLOUDY (*)    Glucose, UA 100 (*)    Bilirubin Urine SMALL (*)    Protein, ur 100 (*)    Leukocytes, UA SMALL (*)    All other components within normal limits  URINE MICROSCOPIC-ADD ON - Abnormal; Notable for the following:    Squamous Epithelial / LPF 0-5 (*)    Bacteria, UA MANY (*)    All other components within normal limits  I-STAT CG4 LACTIC ACID, ED - Abnormal; Notable for the following:    Lactic Acid, Venous 3.14 (*)    All other  components within normal limits  I-STAT CG4 LACTIC ACID, ED - Abnormal; Notable for the following:    Lactic Acid, Venous 3.05 (*)    All other components within normal limits  CULTURE, BLOOD (ROUTINE X 2)  CULTURE, BLOOD (ROUTINE X 2)  URINE CULTURE  LIPASE, BLOOD  AMYLASE    EKG  EKG Interpretation None       Radiology Ct Abdomen Pelvis Wo Contrast  Result Date: 04/22/2016 CLINICAL DATA:  Leukocytosis, sepsis. History of recent pancreatitis. EXAM: CT ABDOMEN AND PELVIS WITHOUT CONTRAST TECHNIQUE: Multidetector CT imaging of the abdomen and pelvis was performed following the standard protocol without IV contrast. COMPARISON:  CT from 04/03/2016 FINDINGS: Lower chest: Bibasilar atelectasis. Stable scarring at the right base. No pneumonic consolidation or pneumothorax. Old right sixth and seventh rib fractures. Normal size cardiac chambers without significant pericardial effusion. Coronary arteriosclerosis is seen. Hepatobiliary: Hepatic steatosis. No space-occupying mass of the liver though assessment is limited by lack of IV contrast. Uncomplicated cholelithiasis. No biliary dilatation. Pancreas: The pancreas line is difficult to discretely identify due to the surrounding peripancreatic fatty inflammation and edema. This appears overall stable without apparent findings of pancreatic necrosis nor pseudocyst formation. Spleen: Normal in size without focal abnormality. Adrenals/Urinary Tract: Normal normal bilateral adrenal glands. Two punctate interpolar right renal calculi without obstructive uropathy. Apparent passage of the left UVJ stone since prior exam. No hydroureteronephrosis is noted. Stable bilateral perinephric fat stranding. Stomach/Bowel: Stomach is moderately distended with enteric contrast. No bowel obstruction or definite inflammation. Extensive colonic diverticulosis is noted along the colon, in particular involving the sigmoid colon. No findings of acute diverticulitis. No  definite abscess. Colonic interposition of the liver seen. There is a normal-appearing appendix. Vascular/Lymphatic: Aortic and branch vessel atherosclerosis. No intraperitoneal, retroperitoneal, pelvic sidewall or inguinal lymphadenopathy. Reproductive: The prostate is enlarged with calcifications as before at the junction of the central and peripheral zone. Other: No abdominal wall hernia or abnormality. No abdominopelvic ascites. Musculoskeletal: No worrisome lytic or sclerotic osseous lesions. IMPRESSION: 1. Stable sequela of pancreatitis without pancreatic necrosis, definite abscess on this unenhanced study nor pseudocyst formation. 2. Uncomplicated cholelithiasis. 3. Passage of left UVJ stone since prior exam. 4. Stable hepatic steatosis. 5. Abdominal aortic atherosclerosis. Electronically Signed   By: Tollie Ethavid  Kwon M.D.   On: 04/22/2016 18:07  Dg Chest 2 View  Result Date: 04/22/2016 CLINICAL DATA:  Strain fatigue and left G which began earlier today. History of diabetes and hypertension and unspecified lung surgery. Former smoker. EXAM: CHEST  2 VIEW COMPARISON:  Portable chest x-ray of March 28, 2016 FINDINGS: There is mild chronic volume loss on the right with elevation of the hemidiaphragm. The aerated portion of the right lung is clear. The left lung is clear. The heart and pulmonary vascularity are normal. There is old deformity of the posterior aspect of the right sixth and seventh ribs. There is degenerative change of the left shoulder. The thoracic spine exhibits mild multilevel degenerative disc space narrowing. IMPRESSION: Chronic volume loss on the right with mild elevation of the hemidiaphragm. No evidence of pneumonia, CHF, nor other acute cardiopulmonary abnormality. Electronically Signed   By: David  [place] M.D.   On: 04/22/2016 15:39    Procedures Procedures (including critical care time)  CRITICAL CARE Performed by: Chase Picket Ward   Total critical care time: 35  minutes  Critical care time was exclusive of separately billable procedures and treating other patients.  Critical care was necessary to treat or prevent imminent or life-threatening deterioration.  Critical care was time spent personally by me on the following activities: development of treatment plan with patient and/or surrogate as well as nursing, discussions with consultants, evaluation of patient's response to treatment, examination of patient, obtaining history from patient or surrogate, ordering and performing treatments and interventions, ordering and review of laboratory studies, ordering and review of radiographic studies, pulse oximetry and re-evaluation of patient's condition.   Medications Ordered in ED Medications  piperacillin-tazobactam (ZOSYN) IVPB 3.375 g (not administered)  vancomycin (VANCOCIN) IVPB 750 mg/150 ml premix (not administered)  sodium chloride 0.9 % bolus 1,000 mL (0 mLs Intravenous Stopped 04/22/16 1706)    And  sodium chloride 0.9 % bolus 1,000 mL (0 mLs Intravenous Stopped 04/22/16 1727)    And  sodium chloride 0.9 % bolus 250 mL (0 mLs Intravenous Stopped 04/22/16 1803)  vancomycin (VANCOCIN) IVPB 1000 mg/200 mL premix (0 mg Intravenous Stopped 04/22/16 1803)  piperacillin-tazobactam (ZOSYN) IVPB 3.375 g (0 g Intravenous Stopped 04/22/16 1639)  iopamidol (ISOVUE-300) 61 % injection 15 mL (15 mLs Oral Contrast Given 04/22/16 1630)  acetaminophen (TYLENOL) tablet 650 mg (650 mg Oral Given 04/22/16 1813)  sodium chloride 0.9 % bolus 1,000 mL (1,000 mLs Intravenous New Bag/Given 04/22/16 1925)     Initial Impression / Assessment and Plan / ED Course  I have reviewed the triage vital signs and the nursing notes.  Pertinent labs & imaging results that were available during my care of the patient were reviewed by me and considered in my medical decision making (see chart for details).  Clinical Course    ALFIO LOESCHER is a 82 y.o. male who presents to ED from  his primary care provider for tachycardia and hypotension. Patient's main complain to PCP was nausea, fatigue, decreased appetite. On exam, patient is febrile 100.8, tachycardic 120's-130's and hypotensive 98/54. Code sepsis was initiated. Blood cx's obtained, weight based fluids started and patient started on vancomycin and Zosyn. Patient was recently admitted for pancreatitis less than one month ago and chart was reviewed from that hospital stay. Patient does not have abdominal tenderness, however given history and nausea/decreased appetite will also obtain CT abdomen.   Lactic acid 3.14, white count of 30.6, with absolute neutrophils 27.3. Patient also has an acute kidney injury with creatinine of 2.03 and BUN of  45. Creatinine at last admission was 0.6.  CT abdomen shows stable sequela of pancreatitis without pancreatic necrosis, definite abscess on this unenhanced study nor pseudocyst formation. Uncomplicated cholelithiasis. Passage of left UVJ stone since prior exam.   Consulted hospitalist who will admit.  Patient seen by and discussed with Dr. Erma Heritage who agrees with treatment plan.    Final Clinical Impressions(s) / ED Diagnoses   Final diagnoses:  None    New Prescriptions New Prescriptions   No medications on file     [facility] Ward, PA-C 04/22/16 1944    Shaune Pollack, MD 04/23/16 1003

## 2016-04-23 DIAGNOSIS — Z8719 Personal history of other diseases of the digestive system: Secondary | ICD-10-CM

## 2016-04-23 DIAGNOSIS — K8020 Calculus of gallbladder without cholecystitis without obstruction: Secondary | ICD-10-CM

## 2016-04-23 DIAGNOSIS — D72829 Elevated white blood cell count, unspecified: Secondary | ICD-10-CM

## 2016-04-23 LAB — COMPREHENSIVE METABOLIC PANEL
ALT: 27 U/L (ref 17–63)
ANION GAP: 8 (ref 5–15)
AST: 21 U/L (ref 15–41)
Albumin: 2.7 g/dL — ABNORMAL LOW (ref 3.5–5.0)
Alkaline Phosphatase: 56 U/L (ref 38–126)
BUN: 35 mg/dL — ABNORMAL HIGH (ref 6–20)
CHLORIDE: 106 mmol/L (ref 101–111)
CO2: 20 mmol/L — ABNORMAL LOW (ref 22–32)
CREATININE: 1.27 mg/dL — AB (ref 0.61–1.24)
Calcium: 8.7 mg/dL — ABNORMAL LOW (ref 8.9–10.3)
GFR, EST AFRICAN AMERICAN: 60 mL/min — AB (ref >60)
GFR, EST NON AFRICAN AMERICAN: 52 mL/min — AB (ref >60)
Glucose, Bld: 125 mg/dL — ABNORMAL HIGH (ref 65–99)
Potassium: 3.6 mmol/L (ref 3.5–5.1)
Sodium: 134 mmol/L — ABNORMAL LOW (ref 135–145)
Total Bilirubin: 1.7 mg/dL — ABNORMAL HIGH (ref 0.3–1.2)
Total Protein: 5.7 g/dL — ABNORMAL LOW (ref 6.5–8.1)

## 2016-04-23 LAB — CBC
HCT: 38.4 % — ABNORMAL LOW (ref 39.0–52.0)
HEMOGLOBIN: 12.8 g/dL — AB (ref 13.0–17.0)
MCH: 32.3 pg (ref 26.0–34.0)
MCHC: 33.3 g/dL (ref 30.0–36.0)
MCV: 97 fL (ref 78.0–100.0)
PLATELETS: 211 10*3/uL (ref 150–400)
RBC: 3.96 MIL/uL — AB (ref 4.22–5.81)
RDW: 13.6 % (ref 11.5–15.5)
WBC: 22.7 10*3/uL — AB (ref 4.0–10.5)

## 2016-04-23 LAB — GLUCOSE, CAPILLARY
GLUCOSE-CAPILLARY: 107 mg/dL — AB (ref 65–99)
GLUCOSE-CAPILLARY: 117 mg/dL — AB (ref 65–99)
GLUCOSE-CAPILLARY: 98 mg/dL (ref 65–99)
Glucose-Capillary: 101 mg/dL — ABNORMAL HIGH (ref 65–99)

## 2016-04-23 LAB — PROTIME-INR
INR: 1.39
PROTHROMBIN TIME: 17.2 s — AB (ref 11.4–15.2)

## 2016-04-23 LAB — BASIC METABOLIC PANEL
Anion gap: 7 (ref 5–15)
BUN: 25 mg/dL — AB (ref 6–20)
CALCIUM: 8.4 mg/dL — AB (ref 8.9–10.3)
CO2: 22 mmol/L (ref 22–32)
Chloride: 104 mmol/L (ref 101–111)
Creatinine, Ser: 1.08 mg/dL (ref 0.61–1.24)
GFR calc Af Amer: >60 mL/min (ref >60)
GLUCOSE: 112 mg/dL — AB (ref 65–99)
POTASSIUM: 3.6 mmol/L (ref 3.5–5.1)
SODIUM: 133 mmol/L — AB (ref 135–145)

## 2016-04-23 LAB — CBG MONITORING, ED
GLUCOSE-CAPILLARY: 114 mg/dL — AB (ref 65–99)
Glucose-Capillary: 111 mg/dL — ABNORMAL HIGH (ref 65–99)
Glucose-Capillary: 106 mg/dL — ABNORMAL HIGH (ref 65–99)

## 2016-04-23 LAB — LACTIC ACID, PLASMA: LACTIC ACID, VENOUS: 1.2 mmol/L (ref 0.5–1.9)

## 2016-04-23 LAB — MRSA PCR SCREENING: MRSA BY PCR: NEGATIVE

## 2016-04-23 LAB — TROPONIN I: TROPONIN I: 0.17 ng/mL — AB (ref <0.03)

## 2016-04-23 MED ORDER — PROCHLORPERAZINE EDISYLATE 5 MG/ML IJ SOLN
5.0000 mg | Freq: Once | INTRAMUSCULAR | Status: AC
  Administered 2016-04-23: 5 mg via INTRAVENOUS
  Filled 2016-04-23: qty 2

## 2016-04-23 MED ORDER — METOPROLOL TARTRATE 25 MG PO TABS
25.0000 mg | ORAL_TABLET | Freq: Two times a day (BID) | ORAL | Status: DC
  Administered 2016-04-23: 25 mg via ORAL
  Filled 2016-04-23: qty 1

## 2016-04-23 MED ORDER — SODIUM CHLORIDE 0.9 % IV SOLN
INTRAVENOUS | Status: DC
Start: 2016-04-23 — End: 2016-04-24

## 2016-04-23 MED ORDER — METOPROLOL TARTRATE 5 MG/5ML IV SOLN: 5.0000 mg | Freq: Four times a day (QID) | INTRAVENOUS | Status: DC | PRN

## 2016-04-23 MED ORDER — SODIUM CHLORIDE 0.9 % IV BOLUS (SEPSIS)
1000.0000 mL | Freq: Once | INTRAVENOUS | Status: AC
  Administered 2016-04-23: 1000 mL via INTRAVENOUS

## 2016-04-23 MED ORDER — METOPROLOL TARTRATE 5 MG/5ML IV SOLN
2.5000 mg | INTRAVENOUS | Status: DC | PRN
  Administered 2016-04-23 – 2016-04-24 (×2): 5 mg via INTRAVENOUS
  Filled 2016-04-23 (×2): qty 5

## 2016-04-23 MED ORDER — ONDANSETRON HCL 4 MG/2ML IJ SOLN
4.0000 mg | Freq: Once | INTRAMUSCULAR | Status: AC
  Administered 2016-04-23: 4 mg via INTRAVENOUS
  Filled 2016-04-23: qty 2

## 2016-04-23 MED ORDER — VANCOMYCIN HCL IN DEXTROSE 750-5 MG/150ML-% IV SOLN
750.0000 mg | Freq: Two times a day (BID) | INTRAVENOUS | Status: DC
  Administered 2016-04-23 – 2016-04-25 (×4): 750 mg via INTRAVENOUS
  Filled 2016-04-23 (×6): qty 150

## 2016-04-23 MED ORDER — METOPROLOL TARTRATE 25 MG PO TABS: 25.0000 mg | ORAL_TABLET | Freq: Two times a day (BID) | ORAL | Status: DC

## 2016-04-23 MED ORDER — ACETAMINOPHEN 325 MG PO TABS
650.0000 mg | ORAL_TABLET | Freq: Once | ORAL | Status: AC
Start: 2016-04-23 — End: 2016-04-23
  Administered 2016-04-23: 650 mg via ORAL
  Filled 2016-04-23: qty 2

## 2016-04-23 MED ORDER — METOPROLOL TARTRATE 5 MG/5ML IV SOLN
5.0000 mg | Freq: Once | INTRAVENOUS | Status: AC
  Administered 2016-04-23: 5 mg via INTRAVENOUS
  Filled 2016-04-23: qty 5

## 2016-04-23 NOTE — ED Notes (Signed)
MD and NP have been paged multiple times with no response, [facility] aware

## 2016-04-23 NOTE — ED Notes (Signed)
Pt actively vomiting, admitting Dr paged

## 2016-04-23 NOTE — ED Notes (Signed)
Hospitalist called back.  Ordered EKG.  Will call back with results.

## 2016-04-23 NOTE — ED Notes (Signed)
Repaged hospitalist 

## 2016-04-23 NOTE — Progress Notes (Signed)
[facility] PROGRESS NOTE    Progress Note  [name]  ZOX:[id number] DOB: [birth year] DOA: 04/22/2016 PCP: [name], MD     Brief Narrative:   Colin West is an 82 y.o. male with history of HTN o/w healthy admitted here 2 weeks ago with acute pancreatitis and gallstones, felt to be gallstone pancreatitis.  He had some hemorrhagic changes by CT, recovered slowly, was Rx with Imipenem, NG tube feeds.  WBC high, eventually improved and pt was dc'd on 04/07/16.  Plan was to have GB removed at a later date.  Pt here now with gen'd weakness for 3-4 days.  Assessment/Plan:   SIRS (HCC): Unclear Source of infectious etiology. The most likely cause is recurrent pancreatitis in the setting of choledocholithiasis, although his lipase is normal. Pt responded to fluid resuscitation, Lactic acidosis has resolved, he started empirically on IV vancomycin and Zosyn his Leukocytosis is trending down and has remained afebrile.  Blood pressure is stable. He has remained tachycardic. Continue strict I's and O's Cultures are pending at this time. CT scan of the abdomen and pelvis showed persistent inflammation around the pancreas which is unchanged no necrosis or pseudocyst  Acute kidney injury: Insetting of severe sepsis likely prerenal, improving with IV fluid hydration. No JVD physical exam lungs are clear. Follow strict I's and O's. Continue aggressive fluid hydration.  Essential hypertension Continue hold antihypertensive medication. Blood pressure is stable.  Cholelithiasis Surgery following, they will proceed with surgical intervention as an outpatient.    Leukocytosis Improving with IV empiric antibiotics  Sinus tachycardia: Likely due to sepsis.   DVT prophylaxis: lovenox Family Communication:none Disposition Plan/Barrier to D/C: unable to determine Code Status:     Code Status Orders        Start     Ordered   04/22/16 2306  Full code  Continuous      04/22/16 2305    Code Status History    Date Active Date Inactive Code Status Order ID Comments User Context   03/23/2016  2:48 PM 04/07/2016  5:03 PM DNR [id number]  Meredeth Ide, MD Inpatient        IV Access:    Peripheral IV   Procedures and diagnostic studies:   Ct Abdomen Pelvis Wo Contrast  Result Date: 04/22/2016 CLINICAL DATA:  Leukocytosis, sepsis. History of recent pancreatitis. EXAM: CT ABDOMEN AND PELVIS WITHOUT CONTRAST TECHNIQUE: Multidetector CT imaging of the abdomen and pelvis was performed following the standard protocol without IV contrast. COMPARISON:  CT from 04/03/2016 FINDINGS: Lower chest: Bibasilar atelectasis. Stable scarring at the right base. No pneumonic consolidation or pneumothorax. Old right sixth and seventh rib fractures. Normal size cardiac chambers without significant pericardial effusion. Coronary arteriosclerosis is seen. Hepatobiliary: Hepatic steatosis. No space-occupying mass of the liver though assessment is limited by lack of IV contrast. Uncomplicated cholelithiasis. No biliary dilatation. Pancreas: The pancreas line is difficult to discretely identify due to the surrounding peripancreatic fatty inflammation and edema. This appears overall stable without apparent findings of pancreatic necrosis nor pseudocyst formation. Spleen: Normal in size without focal abnormality. Adrenals/Urinary Tract: Normal normal bilateral adrenal glands. Two punctate interpolar right renal calculi without obstructive uropathy. Apparent passage of the left UVJ stone since prior exam. No hydroureteronephrosis is noted. Stable bilateral perinephric fat stranding. Stomach/Bowel: Stomach is moderately distended with enteric contrast. No bowel obstruction or definite inflammation. Extensive colonic diverticulosis is noted along the colon, in particular involving the sigmoid colon. No findings of acute diverticulitis. No definite  abscess. Colonic interposition of the liver seen.  There is a normal-appearing appendix. Vascular/Lymphatic: Aortic and branch vessel atherosclerosis. No intraperitoneal, retroperitoneal, pelvic sidewall or inguinal lymphadenopathy. Reproductive: The prostate is enlarged with calcifications as before at the junction of the central and peripheral zone. Other: No abdominal wall hernia or abnormality. No abdominopelvic ascites. Musculoskeletal: No worrisome lytic or sclerotic osseous lesions. IMPRESSION: 1. Stable sequela of pancreatitis without pancreatic necrosis, definite abscess on this unenhanced study nor pseudocyst formation. 2. Uncomplicated cholelithiasis. 3. Passage of left UVJ stone since prior exam. 4. Stable hepatic steatosis. 5. Abdominal aortic atherosclerosis. Electronically Signed   By: Tollie Ethavid  Kwon M.D.   On: 04/22/2016 18:07   Dg Chest 2 View  Result Date: 04/22/2016 CLINICAL DATA:  Strain fatigue and left G which began earlier today. History of diabetes and hypertension and unspecified lung surgery. Former smoker. EXAM: CHEST  2 VIEW COMPARISON:  Portable chest x-ray of March 28, 2016 FINDINGS: There is mild chronic volume loss on the right with elevation of the hemidiaphragm. The aerated portion of the right lung is clear. The left lung is clear. The heart and pulmonary vascularity are normal. There is old deformity of the posterior aspect of the right sixth and seventh ribs. There is degenerative change of the left shoulder. The thoracic spine exhibits mild multilevel degenerative disc space narrowing. IMPRESSION: Chronic volume loss on the right with mild elevation of the hemidiaphragm. No evidence of pneumonia, CHF, nor other acute cardiopulmonary abnormality. Electronically Signed   By: David  [place] M.D.   On: 04/22/2016 15:39     Medical Consultants:    None.  Anti-Infectives:   Vancomycin and Zosyn started on admission.  Subjective:    [name] he relates he feels much better than yesterday. Denies any abdominal  pain back pain cough shortness of breath dysuria suprapubic pain.  Objective:    Vitals:   04/23/16 1024 04/23/16 1100 04/23/16 1200 04/23/16 1300  BP: 103/63 (!) 118/59 118/64 109/65  Pulse:  (!) 107  (!) 144  Resp:  (!) 24 (!) 22 (!) 23  Temp: 98.2 F (36.8 C)  99.3 F (37.4 C)   TempSrc: Oral  Oral   SpO2: 98% 95% 98% 95%  Weight:      Height:        Intake/Output Summary (Last 24 hours) at 04/23/16 1423 Last data filed at 04/23/16 1320  Gross per 24 hour  Intake          3966.67 ml  Output              125 ml  Net          3841.67 ml   Filed Weights   04/22/16 1454 04/22/16 1547  Weight: 69.4 kg (153 lb) 70.3 kg (155 lb)    Exam: General exam: In no acute distress. Respiratory system: Good air movement and clear to auscultation. Cardiovascular system: S1 & S2 heard, RRR.  Gastrointestinal system: Abdomen is nondistended, soft and nontender.  Central nervous system: Alert and oriented. No focal neurological deficits. Extremities: No pedal edema. Skin: No rashes, lesions or ulcers Psychiatry: Judgement and insight appear normal. Mood & affect appropriate.    Data Reviewed:    Labs: Basic Metabolic Panel:  Recent Labs Lab 04/22/16 1554 04/23/16 0507  NA 131* 134*  K 4.9 3.6  CL 98* 106  CO2 23 20*  GLUCOSE 249* 125*  BUN 45* 35*  CREATININE 2.03* 1.27*  CALCIUM 9.7 8.7*   GFR  Estimated Creatinine Clearance: 46.1 mL/min (by C-G formula based on SCr of 1.27 mg/dL (H)). Liver Function Tests:  Recent Labs Lab 04/22/16 1554 04/23/16 0507  AST 26 21  ALT 34 27  ALKPHOS 70 56  BILITOT 2.1* 1.7*  PROT 7.5 5.7*  ALBUMIN 3.6 2.7*    Recent Labs Lab 04/22/16 1554  LIPASE 22  AMYLASE 30   No results for input(s): AMMONIA in the last 168 hours. Coagulation profile  Recent Labs Lab 04/23/16 0507  INR 1.39    CBC:  Recent Labs Lab 04/22/16 1554 04/23/16 0507  WBC 30.6* 22.7*  NEUTROABS 27.3*  --   HGB 15.3 12.8*  HCT 44.3 38.4*    MCV 96.5 97.0  PLT 266 211   Cardiac Enzymes: No results for input(s): CKTOTAL, CKMB, CKMBINDEX, TROPONINI in the last 168 hours. BNP (last 3 results) No results for input(s): PROBNP in the last 8760 hours. CBG:  Recent Labs Lab 04/22/16 2050 04/23/16 0020 04/23/16 0403 04/23/16 0808 04/23/16 1141  GLUCAP 176* 114* 111* 106* 101*   D-Dimer: No results for input(s): DDIMER in the last 72 hours. Hgb A1c: No results for input(s): HGBA1C in the last 72 hours. Lipid Profile: No results for input(s): CHOL, HDL, LDLCALC, TRIG, CHOLHDL, LDLDIRECT in the last 72 hours. Thyroid function studies: No results for input(s): TSH, T4TOTAL, T3FREE, THYROIDAB in the last 72 hours.  Invalid input(s): FREET3 Anemia work up: No results for input(s): VITAMINB12, FOLATE, FERRITIN, TIBC, IRON, RETICCTPCT in the last 72 hours. Sepsis Labs:  Recent Labs Lab 04/22/16 1554 04/22/16 1608 04/22/16 1815 04/23/16 0507 04/23/16 0541  WBC 30.6*  --   --  22.7*  --   LATICACIDVEN  --  3.14* 3.05*  --  1.2   Microbiology Recent Results (from the past 240 hour(s))  Culture, blood (Routine x 2)     Status: None (Preliminary result)   Collection Time: 04/22/16  3:54 PM  Result Value Ref Range Status   Specimen Description BLOOD RIGHT ANTECUBITAL  Final   Special Requests BOTTLES DRAWN AEROBIC AND ANAEROBIC 5CC  Final   Culture   Final    NO GROWTH < 24 HOURS [facility]    Report Status PENDING  Incomplete  Culture, blood (Routine x 2)     Status: None (Preliminary result)   Collection Time: 04/22/16  4:01 PM  Result Value Ref Range Status   Specimen Description BLOOD LEFT ANTECUBITAL  Final   Special Requests BOTTLES DRAWN AEROBIC AND ANAEROBIC 5CC  Final   Culture   Final    NO GROWTH < 24 HOURS [facility]    Report Status PENDING  Incomplete  MRSA PCR Screening     Status: None   Collection Time: 04/23/16 10:42 AM  Result Value Ref Range  Status   MRSA by PCR NEGATIVE NEGATIVE Final    Comment:        The GeneXpert MRSA Assay (FDA approved for NASAL specimens only), is one component of a comprehensive MRSA colonization surveillance program. It is not intended to diagnose MRSA infection nor to guide or monitor treatment for MRSA infections.      Medications:   . insulin aspart  0-9 Units Subcutaneous Q4H  . piperacillin-tazobactam (ZOSYN)  IV  3.375 g Intravenous Q8H  . vancomycin  750 mg Intravenous Q12H   Continuous Infusions: . sodium chloride      Time spent: 25 min   LOS: 1 day  Marinda Elk  [facility] Pager [phone]  *Please refer to [email or web address], password [email or web address] to get updated schedule on who will round on this patient, as hospitalists switch teams weekly. If 7PM-7AM, please contact night-coverage at [email or web address], password [email or web address] for any overnight needs.  04/23/2016, 2:23 PM

## 2016-04-23 NOTE — ED Notes (Signed)
Paged hospitalist for EKG results.

## 2016-04-23 NOTE — ED Notes (Signed)
New hospitalist on call paged.

## 2016-04-23 NOTE — ED Notes (Signed)
Spoke with hospitalist.  He will be coming down to look at pt in person.

## 2016-04-23 NOTE — Consult Note (Signed)
Reason for Consult:pancreatitis Referring Physician: Dr. Larwance Rote is an 82 y.o. male.  HPI: The patient is an 82 yo wm who presents with weakness and sepsis syndrome. He was admitted about 3 weeks ago with hemorrhagic gallstone pancreatitis. Over the last few days he did develop some nausea and loss of appetite. He denies abdominal pain. CT shows persistent inflammation around pancreas that is unchanged - no necrosis or pseudocyst. He is responding well to resuscitation  Past Medical History:  Diagnosis Date  . Anxiety   . Carotid artery occlusion   . Depression    denies  . Diabetes mellitus    denies  . History of kidney stones   . Hyperlipidemia   . Hypertension     Past Surgical History:  Procedure Laterality Date  . ELBOW ARTHROSCOPY Right   . INGUINAL HERNIA REPAIR Left Sept 2006   Dr Georgette Dover  . INGUINAL HERNIA REPAIR Bilateral 06/01/2013   Procedure: LAPAROSCOPIC EXPLORATION BILATERAL INGUINAL HERNIA REPAIR;  Surgeon: Adin Hector, MD;  Location: [facility];  Service: General;  Laterality: Bilateral;  Left Femoral, Left Recurrent inguinal, Right Inguinal  . INSERTION OF MESH Bilateral 06/01/2013   Procedure: INSERTION OF MESH;  Surgeon: Adin Hector, MD;  Location: [facility];  Service: General;  Laterality: Bilateral;  Left Femoral, Left Recurrent inguinal, Right Inguinal  . LAPAROSCOPIC LYSIS OF ADHESIONS Bilateral 06/01/2013   Procedure: LAPAROSCOPIC LYSIS OF ADHESIONS;  Surgeon: Adin Hector, MD;  Location: [place];  Service: General;  Laterality: Bilateral;  . LUNG REMOVAL, PARTIAL  Sept. 2011    Dr. Arlyce Dice  . ROTATOR CUFF REPAIR     Bilateral repair    Family History  Problem Relation Age of Onset  . Heart disease Brother     Heart Disease before age 32  . Heart attack Brother     Social History:  reports that he quit smoking about 42 years ago. He has never used smokeless tobacco. He reports that he does not drink alcohol or use drugs.  Allergies:  No Known Allergies  Medications: I have reviewed the patient's current medications.  Results for orders placed or performed during the hospital encounter of 04/22/16 (from the past 48 hour(s))  Comprehensive metabolic panel     Status: Abnormal   Collection Time: 04/22/16  3:54 PM  Result Value Ref Range   Sodium 131 (L) 135 - 145 mmol/L   Potassium 4.9 3.5 - 5.1 mmol/L   Chloride 98 (L) 101 - 111 mmol/L   CO2 23 22 - 32 mmol/L   Glucose, Bld 249 (H) 65 - 99 mg/dL   BUN 45 (H) 6 - 20 mg/dL   Creatinine, Ser 2.03 (H) 0.61 - 1.24 mg/dL   Calcium 9.7 8.9 - 10.3 mg/dL   Total Protein 7.5 6.5 - 8.1 g/dL   Albumin 3.6 3.5 - 5.0 g/dL   AST 26 15 - 41 U/L   ALT 34 17 - 63 U/L   Alkaline Phosphatase 70 38 - 126 U/L   Total Bilirubin 2.1 (H) 0.3 - 1.2 mg/dL   GFR calc non Af Amer 29 (L) >60 mL/min   GFR calc Af Amer 34 (L) >60 mL/min    Comment: (NOTE) The eGFR has been calculated using the CKD EPI equation. This calculation has not been validated in all clinical situations. eGFR's persistently <60 mL/min signify possible Chronic Kidney Disease.    Anion gap 10 5 - 15  CBC with Differential  Status: Abnormal   Collection Time: 04/22/16  3:54 PM  Result Value Ref Range   WBC 30.6 (H) 4.0 - 10.5 K/uL   RBC 4.59 4.22 - 5.81 MIL/uL   Hemoglobin 15.3 13.0 - 17.0 g/dL   HCT 44.3 39.0 - 52.0 %   MCV 96.5 78.0 - 100.0 fL   MCH 33.3 26.0 - 34.0 pg   MCHC 34.5 30.0 - 36.0 g/dL   RDW 13.5 11.5 - 15.5 %   Platelets 266 150 - 400 K/uL   Neutrophils Relative % 89 %   Lymphocytes Relative 4 %   Monocytes Relative 7 %   Eosinophils Relative 0 %   Basophils Relative 0 %   Neutro Abs 27.3 (H) 1.7 - 7.7 K/uL   Lymphs Abs 1.2 0.7 - 4.0 K/uL   Monocytes Absolute 2.1 (H) 0.1 - 1.0 K/uL   Eosinophils Absolute 0.0 0.0 - 0.7 K/uL   Basophils Absolute 0.0 0.0 - 0.1 K/uL   WBC Morphology VACUOLATED NEUTROPHILS   Culture, blood (Routine x 2)     Status: None (Preliminary result)   Collection  Time: 04/22/16  3:54 PM  Result Value Ref Range   Specimen Description BLOOD RIGHT ANTECUBITAL    Special Requests BOTTLES DRAWN AEROBIC AND ANAEROBIC 5CC    Culture      NO GROWTH < 24 HOURS Performed at [facility]    Report Status PENDING   Lipase, blood     Status: None   Collection Time: 04/22/16  3:54 PM  Result Value Ref Range   Lipase 22 11 - 51 U/L  Amylase     Status: None   Collection Time: 04/22/16  3:54 PM  Result Value Ref Range   Amylase 30 28 - 100 U/L  Culture, blood (Routine x 2)     Status: None (Preliminary result)   Collection Time: 04/22/16  4:01 PM  Result Value Ref Range   Specimen Description BLOOD LEFT ANTECUBITAL    Special Requests BOTTLES DRAWN AEROBIC AND ANAEROBIC 5CC    Culture      NO GROWTH < 24 HOURS Performed at [facility]    Report Status PENDING   I-Stat CG4 Lactic Acid, ED     Status: Abnormal   Collection Time: 04/22/16  4:08 PM  Result Value Ref Range   Lactic Acid, Venous 3.14 (HH) 0.5 - 1.9 mmol/L   Comment NOTIFIED PHYSICIAN   Urinalysis, Routine w reflex microscopic     Status: Abnormal   Collection Time: 04/22/16  4:14 PM  Result Value Ref Range   Color, Urine ORANGE (A) YELLOW    Comment: BIOCHEMICALS MAY BE AFFECTED BY COLOR   APPearance CLOUDY (A) CLEAR   Specific Gravity, Urine 1.023 1.005 - 1.030   pH 6.0 5.0 - 8.0   Glucose, UA 100 (A) NEGATIVE mg/dL   Hgb urine dipstick NEGATIVE NEGATIVE   Bilirubin Urine SMALL (A) NEGATIVE   Ketones, ur NEGATIVE NEGATIVE mg/dL   Protein, ur 100 (A) NEGATIVE mg/dL   Nitrite NEGATIVE NEGATIVE   Leukocytes, UA SMALL (A) NEGATIVE  Urine microscopic-add on     Status: Abnormal   Collection Time: 04/22/16  4:14 PM  Result Value Ref Range   Squamous Epithelial / LPF 0-5 (A) NONE SEEN   WBC, UA 0-5 0 - 5 WBC/hpf   RBC / HPF NONE SEEN 0 - 5 RBC/hpf   Bacteria, UA MANY (A) NONE SEEN   Urine-Other LESS THAN 10 mL OF URINE   SUBMITTED   I-Stat CG4 Lactic Acid, ED      Status: Abnormal   Collection Time: 04/22/16  6:15 PM  Result Value Ref Range   Lactic Acid, Venous 3.05 (HH) 0.5 - 1.9 mmol/L   Comment NOTIFIED PHYSICIAN   CBG monitoring, ED     Status: Abnormal   Collection Time: 04/22/16  8:50 PM  Result Value Ref Range   Glucose-Capillary 176 (H) 65 - 99 mg/dL  CBG monitoring, ED     Status: Abnormal   Collection Time: 04/23/16 12:20 AM  Result Value Ref Range   Glucose-Capillary 114 (H) 65 - 99 mg/dL  CBG monitoring, ED     Status: Abnormal   Collection Time: 04/23/16  4:03 AM  Result Value Ref Range   Glucose-Capillary 111 (H) 65 - 99 mg/dL  Comprehensive metabolic panel     Status: Abnormal   Collection Time: 04/23/16  5:07 AM  Result Value Ref Range   Sodium 134 (L) 135 - 145 mmol/L   Potassium 3.6 3.5 - 5.1 mmol/L    Comment: DELTA CHECK NOTED   Chloride 106 101 - 111 mmol/L   CO2 20 (L) 22 - 32 mmol/L   Glucose, Bld 125 (H) 65 - 99 mg/dL   BUN 35 (H) 6 - 20 mg/dL   Creatinine, Ser 1.27 (H) 0.61 - 1.24 mg/dL   Calcium 8.7 (L) 8.9 - 10.3 mg/dL   Total Protein 5.7 (L) 6.5 - 8.1 g/dL   Albumin 2.7 (L) 3.5 - 5.0 g/dL   AST 21 15 - 41 U/L   ALT 27 17 - 63 U/L   Alkaline Phosphatase 56 38 - 126 U/L   Total Bilirubin 1.7 (H) 0.3 - 1.2 mg/dL   GFR calc non Af Amer 52 (L) >60 mL/min   GFR calc Af Amer 60 (L) >60 mL/min    Comment: (NOTE) The eGFR has been calculated using the CKD EPI equation. This calculation has not been validated in all clinical situations. eGFR's persistently <60 mL/min signify possible Chronic Kidney Disease.    Anion gap 8 5 - 15  CBC     Status: Abnormal   Collection Time: 04/23/16  5:07 AM  Result Value Ref Range   WBC 22.7 (H) 4.0 - 10.5 K/uL   RBC 3.96 (L) 4.22 - 5.81 MIL/uL   Hemoglobin 12.8 (L) 13.0 - 17.0 g/dL   HCT 38.4 (L) 39.0 - 52.0 %   MCV 97.0 78.0 - 100.0 fL   MCH 32.3 26.0 - 34.0 pg   MCHC 33.3 30.0 - 36.0 g/dL   RDW 13.6 11.5 - 15.5 %   Platelets 211 150 - 400 K/uL  Protime-INR      Status: Abnormal   Collection Time: 04/23/16  5:07 AM  Result Value Ref Range   Prothrombin Time 17.2 (H) 11.4 - 15.2 seconds   INR 1.39   Lactic acid, plasma     Status: None   Collection Time: 04/23/16  5:41 AM  Result Value Ref Range   Lactic Acid, Venous 1.2 0.5 - 1.9 mmol/L  CBG monitoring, ED     Status: Abnormal   Collection Time: 04/23/16  8:08 AM  Result Value Ref Range   Glucose-Capillary 106 (H) 65 - 99 mg/dL  MRSA PCR Screening     Status: None   Collection Time: 04/23/16 10:42 AM  Result Value Ref Range   MRSA by PCR NEGATIVE NEGATIVE    Comment:        The   GeneXpert MRSA Assay (FDA approved for NASAL specimens only), is one component of a comprehensive MRSA colonization surveillance program. It is not intended to diagnose MRSA infection nor to guide or monitor treatment for MRSA infections.   Glucose, capillary     Status: Abnormal   Collection Time: 04/23/16 11:41 AM  Result Value Ref Range   Glucose-Capillary 101 (H) 65 - 99 mg/dL    Ct Abdomen Pelvis Wo Contrast  Result Date: 04/22/2016 CLINICAL DATA:  Leukocytosis, sepsis. History of recent pancreatitis. EXAM: CT ABDOMEN AND PELVIS WITHOUT CONTRAST TECHNIQUE: Multidetector CT imaging of the abdomen and pelvis was performed following the standard protocol without IV contrast. COMPARISON:  CT from 04/03/2016 FINDINGS: Lower chest: Bibasilar atelectasis. Stable scarring at the right base. No pneumonic consolidation or pneumothorax. Old right sixth and seventh rib fractures. Normal size cardiac chambers without significant pericardial effusion. Coronary arteriosclerosis is seen. Hepatobiliary: Hepatic steatosis. No space-occupying mass of the liver though assessment is limited by lack of IV contrast. Uncomplicated cholelithiasis. No biliary dilatation. Pancreas: The pancreas line is difficult to discretely identify due to the surrounding peripancreatic fatty inflammation and edema. This appears overall stable without  apparent findings of pancreatic necrosis nor pseudocyst formation. Spleen: Normal in size without focal abnormality. Adrenals/Urinary Tract: Normal normal bilateral adrenal glands. Two punctate interpolar right renal calculi without obstructive uropathy. Apparent passage of the left UVJ stone since prior exam. No hydroureteronephrosis is noted. Stable bilateral perinephric fat stranding. Stomach/Bowel: Stomach is moderately distended with enteric contrast. No bowel obstruction or definite inflammation. Extensive colonic diverticulosis is noted along the colon, in particular involving the sigmoid colon. No findings of acute diverticulitis. No definite abscess. Colonic interposition of the liver seen. There is a normal-appearing appendix. Vascular/Lymphatic: Aortic and branch vessel atherosclerosis. No intraperitoneal, retroperitoneal, pelvic sidewall or inguinal lymphadenopathy. Reproductive: The prostate is enlarged with calcifications as before at the junction of the central and peripheral zone. Other: No abdominal wall hernia or abnormality. No abdominopelvic ascites. Musculoskeletal: No worrisome lytic or sclerotic osseous lesions. IMPRESSION: 1. Stable sequela of pancreatitis without pancreatic necrosis, definite abscess on this unenhanced study nor pseudocyst formation. 2. Uncomplicated cholelithiasis. 3. Passage of left UVJ stone since prior exam. 4. Stable hepatic steatosis. 5. Abdominal aortic atherosclerosis. Electronically Signed   By: David  Kwon M.D.   On: 04/22/2016 18:07   Dg Chest 2 View  Result Date: 04/22/2016 CLINICAL DATA:  Strain fatigue and left G which began earlier today. History of diabetes and hypertension and unspecified lung surgery. Former smoker. EXAM: CHEST  2 VIEW COMPARISON:  Portable chest x-ray of March 28, 2016 FINDINGS: There is mild chronic volume loss on the right with elevation of the hemidiaphragm. The aerated portion of the right lung is clear. The left lung is clear.  The heart and pulmonary vascularity are normal. There is old deformity of the posterior aspect of the right sixth and seventh ribs. There is degenerative change of the left shoulder. The thoracic spine exhibits mild multilevel degenerative disc space narrowing. IMPRESSION: Chronic volume loss on the right with mild elevation of the hemidiaphragm. No evidence of pneumonia, CHF, nor other acute cardiopulmonary abnormality. Electronically Signed   By: David  Jordan M.D.   On: 04/22/2016 15:39    Review of Systems  Constitutional: Positive for malaise/fatigue.  HENT: Negative.   Eyes: Negative.   Respiratory: Negative.   Cardiovascular: Negative.   Gastrointestinal: Negative for abdominal pain and nausea.  Genitourinary: Negative.   Musculoskeletal: Negative.   Skin: Negative.     Neurological: Positive for weakness.  Endo/Heme/Allergies: Negative.   Psychiatric/Behavioral: Negative.    Blood pressure 109/65, pulse (!) 144, temperature 99.3 F (37.4 C), temperature source Oral, resp. rate (!) 23, height 5' 9" (1.753 m), weight 70.3 kg (155 lb), SpO2 95 %. Physical Exam  Constitutional: He is oriented to person, place, and time. He appears well-developed and well-nourished.  HENT:  Head: Normocephalic and atraumatic.  Eyes: Conjunctivae and EOM are normal. Pupils are equal, round, and reactive to light.  Neck: Normal range of motion. Neck supple.  Cardiovascular: Normal rate, regular rhythm and normal heart sounds.   Respiratory: Effort normal and breath sounds normal.  GI: Soft.  There is no tenderness or distension  Musculoskeletal: Normal range of motion.  Neurological: He is alert and oriented to person, place, and time.  Skin: Skin is warm and dry.  Psychiatric: He has a normal mood and affect. His behavior is normal.    Assessment/Plan: The patient was recently hospitalized with gallstone pancreatitis. He has failed to thrive at home and is readmitted with sepsis syndrome. It is  difficult to tell if pancreatitis is contributing to this but it may be less likely since lipase is normal. I would continue to treat him with abx and resuscitation. If he continues to improve we may consider removing gallbladder during this admission. Will discuss with medical team and follow  TOTH III,Teondra Newburg S 04/23/2016, 2:10 PM

## 2016-04-23 NOTE — ED Notes (Addendum)
Pt threw up in bed.  Gave 4mg  Zofran PRN for nausea.  Changed gown and cleaned equipment.

## 2016-04-23 NOTE — Progress Notes (Signed)
eLink Physician-Brief Progress Note Patient Name: Colin West DOB: 23-Feb-1934 MRN: [id number]   Date of Service  04/23/2016  HPI/Events of Note  Tachycardia (suspect PSVT with PVCs and couplets). Pt denies CP, appears only mildly anxious  eICU Interventions  EKG now Trop I now and in AM 11/09 PRN metoprolol to maintain HR < 115/min     Intervention Category Major Interventions: Arrhythmia - evaluation and management  Merwyn KatosDavid B Embry Manrique 04/23/2016, 5:38 PM

## 2016-04-23 NOTE — ED Notes (Signed)
Pt's elevated HR reported to ED physician.  Hospitalist paged.

## 2016-04-23 NOTE — ED Notes (Signed)
20 min timer started, spoke with Angelique Blonderenise

## 2016-04-24 ENCOUNTER — Encounter (HOSPITAL_COMMUNITY): Payer: Self-pay | Admitting: Student

## 2016-04-24 ENCOUNTER — Inpatient Hospital Stay (HOSPITAL_COMMUNITY): Payer: Medicare Other

## 2016-04-24 ENCOUNTER — Other Ambulatory Visit (HOSPITAL_COMMUNITY): Payer: Medicare Other

## 2016-04-24 DIAGNOSIS — R6510 Systemic inflammatory response syndrome (SIRS) of non-infectious origin without acute organ dysfunction: Secondary | ICD-10-CM

## 2016-04-24 DIAGNOSIS — R000 Tachycardia, unspecified: Secondary | ICD-10-CM

## 2016-04-24 DIAGNOSIS — I10 Essential (primary) hypertension: Secondary | ICD-10-CM

## 2016-04-24 LAB — TROPONIN I: TROPONIN I: 0.09 ng/mL — AB (ref <0.03)

## 2016-04-24 LAB — BASIC METABOLIC PANEL
Anion gap: 7 (ref 5–15)
BUN: 24 mg/dL — AB (ref 6–20)
CALCIUM: 8.2 mg/dL — AB (ref 8.9–10.3)
CO2: 21 mmol/L — ABNORMAL LOW (ref 22–32)
CREATININE: 1.03 mg/dL (ref 0.61–1.24)
Chloride: 104 mmol/L (ref 101–111)
GFR calc Af Amer: >60 mL/min (ref >60)
GLUCOSE: 99 mg/dL (ref 65–99)
Potassium: 3.1 mmol/L — ABNORMAL LOW (ref 3.5–5.1)
SODIUM: 132 mmol/L — AB (ref 135–145)

## 2016-04-24 LAB — GLUCOSE, CAPILLARY
GLUCOSE-CAPILLARY: 100 mg/dL — AB (ref 65–99)
GLUCOSE-CAPILLARY: 104 mg/dL — AB (ref 65–99)
Glucose-Capillary: 121 mg/dL — ABNORMAL HIGH (ref 65–99)
Glucose-Capillary: 119 mg/dL — ABNORMAL HIGH (ref 65–99)
Glucose-Capillary: 129 mg/dL — ABNORMAL HIGH (ref 65–99)

## 2016-04-24 LAB — URINE CULTURE: Culture: >=100000 — AB

## 2016-04-24 MED ORDER — SODIUM CHLORIDE 0.9% FLUSH
10.0000 mL | INTRAVENOUS | Status: DC | PRN
  Administered 2016-04-27 – 2016-04-28 (×2): 20 mL
  Filled 2016-04-24 (×2): qty 40

## 2016-04-24 MED ORDER — METOPROLOL TARTRATE 25 MG PO TABS
50.0000 mg | ORAL_TABLET | Freq: Two times a day (BID) | ORAL | Status: DC
  Administered 2016-04-24 – 2016-04-25 (×4): 50 mg via ORAL
  Filled 2016-04-24 (×4): qty 2

## 2016-04-24 MED ORDER — METOPROLOL TARTRATE 5 MG/5ML IV SOLN
5.0000 mg | INTRAVENOUS | Status: DC | PRN
  Administered 2016-04-24: 5 mg via INTRAVENOUS
  Filled 2016-04-24: qty 5

## 2016-04-24 MED ORDER — MORPHINE SULFATE (PF) 2 MG/ML IV SOLN
2.0000 mg | INTRAVENOUS | Status: DC | PRN
  Administered 2016-04-24 – 2016-04-26 (×5): 2 mg via INTRAVENOUS
  Filled 2016-04-24 (×5): qty 1

## 2016-04-24 MED ORDER — POTASSIUM CHLORIDE 2 MEQ/ML IV SOLN
INTRAVENOUS | Status: DC
  Administered 2016-04-24 – 2016-04-25 (×4): via INTRAVENOUS
  Filled 2016-04-24 (×6): qty 1000

## 2016-04-24 MED ORDER — ZOLPIDEM TARTRATE 5 MG PO TABS
5.0000 mg | ORAL_TABLET | Freq: Every day | ORAL | Status: DC
  Administered 2016-04-24 – 2016-04-27 (×4): 5 mg via ORAL
  Filled 2016-04-24 (×4): qty 1

## 2016-04-24 MED ORDER — SODIUM CHLORIDE 0.9% FLUSH
10.0000 mL | Freq: Two times a day (BID) | INTRAVENOUS | Status: DC
Start: 2016-04-24 — End: 2016-04-28
  Administered 2016-04-24: 30 mL
  Administered 2016-04-24 – 2016-04-26 (×4): 10 mL
  Administered 2016-04-26 – 2016-04-27 (×2): 20 mL

## 2016-04-24 NOTE — Progress Notes (Signed)
Subjective: Complains of feeling weak. Denies abdominal pain  Objective: Vital signs in last 24 hours: Temp:  [98.1 F (36.7 C)-99.3 F (37.4 C)] 98.4 F (36.9 C) (11/09 0628) Pulse Rate:  [91-158] 134 (11/09 0843) Resp:  [15-29] 24 (11/09 0843) BP: (76-142)/(39-80) 113/70 (11/09 0843) SpO2:  [89 %-98 %] 92 % (11/09 0843) Weight:  [73.7 kg (162 lb 7.7 oz)] 73.7 kg (162 lb 7.7 oz) (11/09 0500)    Intake/Output from previous day: 11/08 0701 - 11/09 0700 In: 1575 [I.V.:1125; IV Piggyback:450] Out: 1025 [Urine:1025] Intake/Output this shift: No intake/output data recorded.  Resp: clear to auscultation bilaterally Cardio: regular rate and rhythm and tachy  GI: soft, nontender  Lab Results:   Recent Labs  04/22/16 1554 04/23/16 0507  WBC 30.6* 22.7*  HGB 15.3 12.8*  HCT 44.3 38.4*  PLT 266 211   BMET  Recent Labs  04/23/16 1852 04/24/16 0305  NA 133* 132*  K 3.6 3.1*  CL 104 104  CO2 22 21*  GLUCOSE 112* 99  BUN 25* 24*  CREATININE 1.08 1.03  CALCIUM 8.4* 8.2*   PT/INR  Recent Labs  04/23/16 0507  LABPROT 17.2*  INR 1.39   ABG No results for input(s): PHART, HCO3 in the last 72 hours.  Invalid input(s): PCO2, PO2  Studies/Results: Ct Abdomen Pelvis Wo Contrast  Result Date: 04/22/2016 CLINICAL DATA:  Leukocytosis, sepsis. History of recent pancreatitis. EXAM: CT ABDOMEN AND PELVIS WITHOUT CONTRAST TECHNIQUE: Multidetector CT imaging of the abdomen and pelvis was performed following the standard protocol without IV contrast. COMPARISON:  CT from 04/03/2016 FINDINGS: Lower chest: Bibasilar atelectasis. Stable scarring at the right base. No pneumonic consolidation or pneumothorax. Old right sixth and seventh rib fractures. Normal size cardiac chambers without significant pericardial effusion. Coronary arteriosclerosis is seen. Hepatobiliary: Hepatic steatosis. No space-occupying mass of the liver though assessment is limited by lack of IV contrast.  Uncomplicated cholelithiasis. No biliary dilatation. Pancreas: The pancreas line is difficult to discretely identify due to the surrounding peripancreatic fatty inflammation and edema. This appears overall stable without apparent findings of pancreatic necrosis nor pseudocyst formation. Spleen: Normal in size without focal abnormality. Adrenals/Urinary Tract: Normal normal bilateral adrenal glands. Two punctate interpolar right renal calculi without obstructive uropathy. Apparent passage of the left UVJ stone since prior exam. No hydroureteronephrosis is noted. Stable bilateral perinephric fat stranding. Stomach/Bowel: Stomach is moderately distended with enteric contrast. No bowel obstruction or definite inflammation. Extensive colonic diverticulosis is noted along the colon, in particular involving the sigmoid colon. No findings of acute diverticulitis. No definite abscess. Colonic interposition of the liver seen. There is a normal-appearing appendix. Vascular/Lymphatic: Aortic and branch vessel atherosclerosis. No intraperitoneal, retroperitoneal, pelvic sidewall or inguinal lymphadenopathy. Reproductive: The prostate is enlarged with calcifications as before at the junction of the central and peripheral zone. Other: No abdominal wall hernia or abnormality. No abdominopelvic ascites. Musculoskeletal: No worrisome lytic or sclerotic osseous lesions. IMPRESSION: 1. Stable sequela of pancreatitis without pancreatic necrosis, definite abscess on this unenhanced study nor pseudocyst formation. 2. Uncomplicated cholelithiasis. 3. Passage of left UVJ stone since prior exam. 4. Stable hepatic steatosis. 5. Abdominal aortic atherosclerosis. Electronically Signed   By: Tollie Ethavid  Kwon M.D.   On: 04/22/2016 18:07   Dg Chest 2 View  Result Date: 04/22/2016 CLINICAL DATA:  Strain fatigue and left G which began earlier today. History of diabetes and hypertension and unspecified lung surgery. Former smoker. EXAM: CHEST  2 VIEW  COMPARISON:  Portable chest x-ray of March 28, 2016 FINDINGS: There is mild chronic volume loss on the right with elevation of the hemidiaphragm. The aerated portion of the right lung is clear. The left lung is clear. The heart and pulmonary vascularity are normal. There is old deformity of the posterior aspect of the right sixth and seventh ribs. There is degenerative change of the left shoulder. The thoracic spine exhibits mild multilevel degenerative disc space narrowing. IMPRESSION: Chronic volume loss on the right with mild elevation of the hemidiaphragm. No evidence of pneumonia, CHF, nor other acute cardiopulmonary abnormality. Electronically Signed   By: David  SwazilandJordan M.D.   On: 04/22/2016 15:39   Dg Chest Port 1 View  Result Date: 04/24/2016 CLINICAL DATA:  Short of breath EXAM: PORTABLE CHEST 1 VIEW COMPARISON:  04/22/2016 FINDINGS: Normal heart size. Ovoid density at the right apex is stable. Lungs are very under aerated but otherwise grossly clear. Chronic right rib deformities. No pneumothorax. IMPRESSION: Stable examination.  Stable ovoid density at the right apex. Electronically Signed   By: Jolaine ClickArthur  Hoss M.D.   On: 04/24/2016 07:56    Anti-infectives: Anti-infectives    Start     Dose/Rate Route Frequency Ordered Stop   04/23/16 1600  vancomycin (VANCOCIN) IVPB 750 mg/150 ml premix  Status:  Discontinued     750 mg 150 mL/hr over 60 Minutes Intravenous Every 24 hours 04/22/16 1705 04/23/16 1049   04/23/16 1200  vancomycin (VANCOCIN) IVPB 750 mg/150 ml premix     750 mg 150 mL/hr over 60 Minutes Intravenous Every 12 hours 04/23/16 1049     04/23/16 0600  vancomycin (VANCOCIN) IVPB 750 mg/150 ml premix  Status:  Discontinued     750 mg 150 mL/hr over 60 Minutes Intravenous Every 12 hours 04/22/16 1602 04/22/16 1705   04/22/16 2200  piperacillin-tazobactam (ZOSYN) IVPB 3.375 g     3.375 g 12.5 mL/hr over 240 Minutes Intravenous Every 8 hours 04/22/16 1602     04/22/16 1530   piperacillin-tazobactam (ZOSYN) IVPB 3.375 g  Status:  Discontinued     3.375 g 100 mL/hr over 30 Minutes Intravenous  Once 04/22/16 1516 04/22/16 1522   04/22/16 1530  vancomycin (VANCOCIN) IVPB 1000 mg/200 mL premix  Status:  Discontinued     1,000 mg 200 mL/hr over 60 Minutes Intravenous  Once 04/22/16 1516 04/22/16 1522   04/22/16 1530  vancomycin (VANCOCIN) IVPB 1000 mg/200 mL premix     1,000 mg 200 mL/hr over 60 Minutes Intravenous STAT 04/22/16 1522 04/22/16 1803   04/22/16 1530  piperacillin-tazobactam (ZOSYN) IVPB 3.375 g     3.375 g 100 mL/hr over 30 Minutes Intravenous STAT 04/22/16 1522 04/22/16 1639      Assessment/Plan: s/p * No surgery found * Presented with sepsis syndrome. Only source seen was urine. Agree with treating with abx Not clear if he has ongoing pancreatitis since lipase is normal Since he has no abdominal pain I think it would be reasonable to try a diet I would recommend getting cardiology consult for elevated troponin. No plan for surgery right now with recent troponin elevation unless cards clears him Will follow  LOS: 2 days    TOTH III,Sabian Kuba S 04/24/2016

## 2016-04-24 NOTE — Progress Notes (Addendum)
[facility] PROGRESS NOTE    Progress Note  Colin HasGraham N Pipkins  ZOX:[id number] DOB: 09-Feb-1934 DOA: 04/22/2016 PCP: Verl BangsADIONTCHENKO, ALEXEI, MD     Brief Narrative:   Colin West is an 82 y.o. male with history of HTN o/w healthy admitted here 2 weeks ago with acute pancreatitis and gallstones, felt to be gallstone pancreatitis.  He had some hemorrhagic changes by CT, recovered slowly, was Rx with Imipenem, NG tube feeds.  WBC high, eventually improved and pt was dc'd on 04/07/16.  Plan was to have GB removed at a later date.  Pt here now with gen'd weakness for 3-4 days.  Assessment/Plan:   SIRS (HCC)Due to acute gallstone pancreatitis: Unclear Source of infectious etiology. The most likely cause is recurrent pancreatitis in the setting of choledocholithiasis, although his lipase is normal. CT scan of the abdomen and pelvis on 11.7.2017 showed persistent inflammation around the pancreas which is unchanged no necrosis or pseudocyst Continue strict I's and O's Cultures are negative till date. Continue to monitor electrolytes replete his needed. We'll inserted PICC line, it may be that we need to start feeding him with postpyloric NG tube if no improvement.  Acute kidney injury: Insetting of severe SIRS likely prerenal, resolved with IV fluid hydration. No JVD physical exam lungs are clear. Follow strict I's and O's. We'll change his fluids to half-normal saline with D5 and potassium supplements.  Essential hypertension Continue hold antihypertensive medication. Blood pressure is stable.  Cholelithiasis Surgery following an appreciate assistance.    Leukocytosis Improving with IV empiric antibiotics  Sinus tachycardia: Likely due to SIRS.Increase metoprolol to 50 mg, continue Toprol IV when necessary for heart rate greater than 110.   DVT prophylaxis: lovenox Family Communication:none Disposition Plan/Barrier to D/C: unable to determine Code Status:     Code Status  Orders        Start     Ordered   04/22/16 2306  Full code  Continuous     04/22/16 2305    Code Status History    Date Active Date Inactive Code Status Order ID Comments User Context   03/23/2016  2:48 PM 04/07/2016  5:03 PM DNR [id number]  Meredeth IdeGagan S Lama, MD Inpatient        IV Access:    Peripheral IV   Procedures and diagnostic studies:   Ct Abdomen Pelvis Wo Contrast  Result Date: 04/22/2016 CLINICAL DATA:  Leukocytosis, sepsis. History of recent pancreatitis. EXAM: CT ABDOMEN AND PELVIS WITHOUT CONTRAST TECHNIQUE: Multidetector CT imaging of the abdomen and pelvis was performed following the standard protocol without IV contrast. COMPARISON:  CT from 04/03/2016 FINDINGS: Lower chest: Bibasilar atelectasis. Stable scarring at the right base. No pneumonic consolidation or pneumothorax. Old right sixth and seventh rib fractures. Normal size cardiac chambers without significant pericardial effusion. Coronary arteriosclerosis is seen. Hepatobiliary: Hepatic steatosis. No space-occupying mass of the liver though assessment is limited by lack of IV contrast. Uncomplicated cholelithiasis. No biliary dilatation. Pancreas: The pancreas line is difficult to discretely identify due to the surrounding peripancreatic fatty inflammation and edema. This appears overall stable without apparent findings of pancreatic necrosis nor pseudocyst formation. Spleen: Normal in size without focal abnormality. Adrenals/Urinary Tract: Normal normal bilateral adrenal glands. Two punctate interpolar right renal calculi without obstructive uropathy. Apparent passage of the left UVJ stone since prior exam. No hydroureteronephrosis is noted. Stable bilateral perinephric fat stranding. Stomach/Bowel: Stomach is moderately distended with enteric contrast. No bowel obstruction or definite inflammation. Extensive colonic diverticulosis is noted along  the colon, in particular involving the sigmoid colon. No findings of acute  diverticulitis. No definite abscess. Colonic interposition of the liver seen. There is a normal-appearing appendix. Vascular/Lymphatic: Aortic and branch vessel atherosclerosis. No intraperitoneal, retroperitoneal, pelvic sidewall or inguinal lymphadenopathy. Reproductive: The prostate is enlarged with calcifications as before at the junction of the central and peripheral zone. Other: No abdominal wall hernia or abnormality. No abdominopelvic ascites. Musculoskeletal: No worrisome lytic or sclerotic osseous lesions. IMPRESSION: 1. Stable sequela of pancreatitis without pancreatic necrosis, definite abscess on this unenhanced study nor pseudocyst formation. 2. Uncomplicated cholelithiasis. 3. Passage of left UVJ stone since prior exam. 4. Stable hepatic steatosis. 5. Abdominal aortic atherosclerosis. Electronically Signed   By: Tollie Ethavid  Kwon M.D.   On: 04/22/2016 18:07   Dg Chest 2 View  Result Date: 04/22/2016 CLINICAL DATA:  Strain fatigue and left G which began earlier today. History of diabetes and hypertension and unspecified lung surgery. Former smoker. EXAM: CHEST  2 VIEW COMPARISON:  Portable chest x-ray of March 28, 2016 FINDINGS: There is mild chronic volume loss on the right with elevation of the hemidiaphragm. The aerated portion of the right lung is clear. The left lung is clear. The heart and pulmonary vascularity are normal. There is old deformity of the posterior aspect of the right sixth and seventh ribs. There is degenerative change of the left shoulder. The thoracic spine exhibits mild multilevel degenerative disc space narrowing. IMPRESSION: Chronic volume loss on the right with mild elevation of the hemidiaphragm. No evidence of pneumonia, CHF, nor other acute cardiopulmonary abnormality. Electronically Signed   By: David  SwazilandJordan M.D.   On: 04/22/2016 15:39     Medical Consultants:    None.  Anti-Infectives:   Vancomycin and Zosyn started on admission.  Subjective:    Colin HasGraham N  Westbrook he relates he is having headache and is tired of being in bed.  Objective:    Vitals:   04/24/16 0500 04/24/16 0600 04/24/16 0628 04/24/16 0700  BP: (!) 142/59 119/61  132/79  Pulse: (!) 135 96  (!) 139  Resp: (!) 23 17    Temp:   98.4 F (36.9 C)   TempSrc:   Oral   SpO2: 94% 94%  92%  Weight: 73.7 kg (162 lb 7.7 oz)     Height:        Intake/Output Summary (Last 24 hours) at 04/24/16 0735 Last data filed at 04/24/16 [id number]  Gross per 24 hour  Intake             1575 ml  Output             1025 ml  Net              550 ml   Filed Weights   04/22/16 1454 04/22/16 1547 04/24/16 0500  Weight: 69.4 kg (153 lb) 70.3 kg (155 lb) 73.7 kg (162 lb 7.7 oz)    Exam: General exam: In no acute distress. Respiratory system: Good air movement and clear to auscultation. Cardiovascular system: S1 & S2 heard, RRR.  Gastrointestinal system: Abdomen is nondistended, soft and nontender.  Central nervous system: Alert and oriented. No focal neurological deficits. Extremities: No pedal edema. Skin: No rashes, lesions or ulcers Psychiatry: Judgement and insight appear normal. Mood & affect appropriate.    Data Reviewed:    Labs: Basic Metabolic Panel:  Recent Labs Lab 04/22/16 1554 04/23/16 0507 04/23/16 1852  NA 131* 134* 133*  K 4.9 3.6 3.6  CL  98* 106 104  CO2 23 20* 22  GLUCOSE 249* 125* 112*  BUN 45* 35* 25*  CREATININE 2.03* 1.27* 1.08  CALCIUM 9.7 8.7* 8.4*   GFR Estimated Creatinine Clearance: 54.6 mL/min (by C-G formula based on SCr of 1.08 mg/dL). Liver Function Tests:  Recent Labs Lab 04/22/16 1554 04/23/16 0507  AST 26 21  ALT 34 27  ALKPHOS 70 56  BILITOT 2.1* 1.7*  PROT 7.5 5.7*  ALBUMIN 3.6 2.7*    Recent Labs Lab 04/22/16 1554  LIPASE 22  AMYLASE 30   No results for input(s): AMMONIA in the last 168 hours. Coagulation profile  Recent Labs Lab 04/23/16 0507  INR 1.39    CBC:  Recent Labs Lab 04/22/16 1554 04/23/16 0507    WBC 30.6* 22.7*  NEUTROABS 27.3*  --   HGB 15.3 12.8*  HCT 44.3 38.4*  MCV 96.5 97.0  PLT 266 211   Cardiac Enzymes:  Recent Labs Lab 04/23/16 1852 04/24/16 0305  TROPONINI 0.17* 0.09*   BNP (last 3 results) No results for input(s): PROBNP in the last 8760 hours. CBG:  Recent Labs Lab 04/23/16 1141 04/23/16 1546 04/23/16 1941 04/23/16 2338 04/24/16 0323  GLUCAP 101* 98 107* 117* 100*   D-Dimer: No results for input(s): DDIMER in the last 72 hours. Hgb A1c: No results for input(s): HGBA1C in the last 72 hours. Lipid Profile: No results for input(s): CHOL, HDL, LDLCALC, TRIG, CHOLHDL, LDLDIRECT in the last 72 hours. Thyroid function studies: No results for input(s): TSH, T4TOTAL, T3FREE, THYROIDAB in the last 72 hours.  Invalid input(s): FREET3 Anemia work up: No results for input(s): VITAMINB12, FOLATE, FERRITIN, TIBC, IRON, RETICCTPCT in the last 72 hours. Sepsis Labs:  Recent Labs Lab 04/22/16 1554 04/22/16 1608 04/22/16 1815 04/23/16 0507 04/23/16 0541  WBC 30.6*  --   --  22.7*  --   LATICACIDVEN  --  3.14* 3.05*  --  1.2   Microbiology Recent Results (from the past 240 hour(s))  Culture, blood (Routine x 2)     Status: None (Preliminary result)   Collection Time: 04/22/16  3:54 PM  Result Value Ref Range Status   Specimen Description BLOOD RIGHT ANTECUBITAL  Final   Special Requests BOTTLES DRAWN AEROBIC AND ANAEROBIC 5CC  Final   Culture   Final    NO GROWTH < 24 HOURS Performed at [facility]    Report Status PENDING  Incomplete  Culture, blood (Routine x 2)     Status: None (Preliminary result)   Collection Time: 04/22/16  4:01 PM  Result Value Ref Range Status   Specimen Description BLOOD LEFT ANTECUBITAL  Final   Special Requests BOTTLES DRAWN AEROBIC AND ANAEROBIC 5CC  Final   Culture   Final    NO GROWTH < 24 HOURS Performed at [facility]    Report Status PENDING  Incomplete  Urine culture     Status: None  (Preliminary result)   Collection Time: 04/22/16  4:14 PM  Result Value Ref Range Status   Specimen Description URINE, CLEAN CATCH  Final   Special Requests NONE  Final   Culture   Final    CULTURE REINCUBATED FOR BETTER GROWTH Performed at [facility]    Report Status PENDING  Incomplete  MRSA PCR Screening     Status: None   Collection Time: 04/23/16 10:42 AM  Result Value Ref Range Status   MRSA by PCR NEGATIVE NEGATIVE Final    Comment:  The GeneXpert MRSA Assay (FDA approved for NASAL specimens only), is one component of a comprehensive MRSA colonization surveillance program. It is not intended to diagnose MRSA infection nor to guide or monitor treatment for MRSA infections.      Medications:   . insulin aspart  0-9 Units Subcutaneous Q4H  . metoprolol tartrate  50 mg Oral BID  . piperacillin-tazobactam (ZOSYN)  IV  3.375 g Intravenous Q8H  . vancomycin  750 mg Intravenous Q12H   Continuous Infusions: . sodium chloride 125 mL/hr at 04/23/16 1900    Time spent: 25 min   LOS: 2 days   Marinda Elk  [facility] Pager [phone]  *Please refer to [email or web address], password [email or web address] to get updated schedule on who will round on this patient, as hospitalists switch teams weekly. If 7PM-7AM, please contact night-coverage at [email or web address], password [email or web address] for any overnight needs.  04/24/2016, 7:35 AM

## 2016-04-24 NOTE — Progress Notes (Signed)
Initial Nutrition Assessment  DOCUMENTATION CODES:   Non-severe (moderate) malnutrition in context of acute illness/injury  INTERVENTION:  - Diet advancement as medically feasible. - RD will follow-up 11/13.  NUTRITION DIAGNOSIS:   Inadequate oral intake related to inability to eat as evidenced by NPO status.  GOAL:   Patient will meet greater than or equal to 90% of their needs  MONITOR:   Diet advancement, Weight trends, Labs, I & O's  REASON FOR ASSESSMENT:   Malnutrition Screening Tool  ASSESSMENT:   82 y.o. male with history of HTN o/w healthy admitted here 2 weeks ago with acute pancreatitis and gallstones, felt to be gallstone pancreatitis.  He had some hemorrhagic changes by CT, recovered slowly, was Rx with Imipenem, NG tube feeds.  WBC high, eventually improved and pt was dc'd on 04/07/16.  Plan was to have GB removed at a later date.  Pt here now with gen'd weakness for 3-4 days.  This am per family was very pale, went to PCP who sent him "right to the emergency room".  In ED patient was hypotensive with tachycardia, temp 100.8, WBC 30k, creat 2.0, BP's 90's, RR 24.  He has rec'd 3 L IVF bolus and according to family looks a lot better.  CXR is clear, CT abd shows significant pancreatic inflammation, no new necrosis/ pseudocyst.  UA negative.  No abd pain per patient, who is somewhat stoic.    Pt seen for MST. BMI indicates normal weight status. Pt has been NPO since admission. Pt was d/c'ed ~2 weeks ago from dx of hemorrhagic pancreatitis at that time. Pt was being followed by another RD during that admission and had required short-term NGT TF.   Pt states that since d/c he has had a poor appetite but denies any feelings of abdominal pain/pressure or nausea with or without PO intakes. He drank water to take medications on 11/6 but otherwise has mainly consuming Glucerna Shake TID (each supplement provides 220 kcal and 10 grams of protein) and very minimal other PO intakes  during 2 week time frame.   Per MD note yesterday at 1410: shows persistent inflammation around pancreas that is unchanged - no necrosis or pseudocyst. Per note today at 0900: I think it would be reasonable to try a diet.  Pt has lost 2 lbs (1.2% body weight) in the past 2 weeks which is not significant for time frame. Physical assessment shows mild fat wasting and moderate muscle wasting to upper body; lower body not assessed at this time.   Medications reviewed; PRN Zofran, sliding scale Novolog.  Labs reviewed; CBGs: 100 and 104 mg/dL today, Na: 782132 mmol/L, K: 3.1 mmol/L, BUN: 24 mg/dL, Ca: 8.2 mg/dL.  IVF: D5-NS-40 mEq KCl @ 75 mL/hr (306 kcal).   Diet Order:  Diet NPO time specified Except for: Ice Chips  Skin:  Reviewed, no issues  Last BM:  PTA/unknown  Height:   Ht Readings from Last 1 Encounters:  04/22/16 5\' 9"  (1.753 m)    Weight:   Wt Readings from Last 1 Encounters:  04/24/16 162 lb 7.7 oz (73.7 kg)    Ideal Body Weight:  72.73 kg  BMI:  Body mass index is 23.99 kg/m.  Estimated Nutritional Needs:   Kcal:  1840-2065 (25-28 kcal/kg)  Protein:  74-88 grams (1-1.2 grams/kg)  Fluid:  >/= 1.8 L/day  EDUCATION NEEDS:   No education needs identified at this time    Trenton GammonJessica Hector Venne, MS, RD, LDN Inpatient Clinical Dietitian Pager # [phone]  After hours/weekend pager # [phone]

## 2016-04-24 NOTE — Progress Notes (Signed)
CRITICAL VALUE ALERT  Critical value received:  Troponin 0.17  Date of notification:  11/08  Time of notification:  200  Critical value read back: yes  Nurse who received alert:  Abby, RN  MD notified (1st page): yes  Time of first page:  2020  no response from provider on call for [facility] at the moment, pt continued to be monitored.

## 2016-04-24 NOTE — Care Management Note (Signed)
Case Management Note  Patient Details  Name: Colin West MRN: [id number] Date of Birth: [birth year]  Subjective/Objective:      sepsis              Action/Plan: home with advanced hhc Date:  April 24, 2016 Chart reviewed for concurrent status and case management needs. Will continue to follow the patient for status change: Discharge Planning: following for needs Expected discharge date: [id number] Marcelle SmilingRhonda Heily Carlucci, BSN, [place]3, [place]   [phone] Expected Discharge Date:   (unknown)               Expected Discharge Plan:  Home w Home Health Services  In-House Referral:     Discharge planning Services  CM Consult  Post Acute Care Choice:    Choice offered to:     DME Arranged:    DME Agency:     HH Arranged:    HH Agency:     Status of Service:  In process, will continue to follow  If discussed at Long Length of Stay Meetings, dates discussed:    Additional Comments:  Golda AcreDavis, Yasmin Bronaugh Lynn, RN 04/24/2016, 9:34 AM

## 2016-04-24 NOTE — Consult Note (Signed)
Cardiology Consult    Patient ID: Colin West MRN: [id number], DOB/AGE: 07/01/1933   Admit date: 04/22/2016 Date of Consult: 04/24/2016  Primary Physician: [name], MD Reason for Consult: SVT Primary Cardiologist: New - Dr. Katrinka Blazing Requesting Provider: Dr. Robb Matar   History of Present Illness    Colin West is a 82 y.o. male with past medical history of recent acute pancreatitis, cholelithiasis, HTN, and Type 2 DM who presented to [facility] ED on 04/22/2016 for generalized weakness.   He was recently admitted from 03/23/2016 - 04/07/2016 for acute hemorrhagic pancreatitis thought to be secondary to gallstones. Had a NG placed throughout most of the admission but as symptoms improved, his diet was advanced and he was discharged home with plans for an outpatient cholecystectomy.   Had been doing well until 4-5 days ago when he began to feel weak and nauseated. Initially went to his PCP's office but was noted to be tachycardiac and hypotensive, therefore was instructed to go to the ED.   Upon arrival, he was tachycardiac into the 130's. SBP was in the 90's. Febrile at 100.8. Labs showed a WBC of 30.6, Hgb 15.3, platelets 266. K+ 4.9. Creatinine 2.03 (was 0.69 two weeks prior). Lactic Acid 3.14. UA with many bacteria and culture being positive for Enterococcus Faecalis. Troponin values have been 0.09 and 0.17. EKG showing sinus tachycardia, HR 120, with occasional PVC's. CXR with no evidence of PNA or CHF. CT Abdomen showing stable sequela of pancreatitis without necrosis, abscess, or pseudocyst formation.  Code Sepsis was activated and he was started on IVF with broad spectrum antibiotics. It is thought his pancreatitis might not be contributing to his presentation as Lipase is normal this admission.  Cardiology is asked to consult on the patient for tachycardia. In reviewing his telemetry, he appears to have episodes of SVT with frequent PAC's and PVC's. An EKG was obtained  yesterday which showed SVT, with HR of 146 and LAD. He also has episodes of sinus tachycardia, as p-waves are noted prior to the QRS complex. HR does go to the 70's - 80's for 1-2 hours at a time, then he converts back to the narrow-complex tachycardia for 1-2 hours.   He denies any recent chest discomfort or palpitations. No past history of MI's or cardiac arrhythmias.     Past Medical History   Past Medical History:  Diagnosis Date  . Anxiety   . Carotid artery stenosis    a. < 40% bilateral stenosis by [place] in 2014  . Depression    denies  . Diabetes mellitus    denies  . History of kidney stones   . Hyperlipidemia   . Hypertension     Past Surgical History:  Procedure Laterality Date  . ELBOW ARTHROSCOPY Right   . INGUINAL HERNIA REPAIR Left Sept 2006   Dr Corliss Skains  . INGUINAL HERNIA REPAIR Bilateral 06/01/2013   Procedure: LAPAROSCOPIC EXPLORATION BILATERAL INGUINAL HERNIA REPAIR;  Surgeon: Ardeth Sportsman, MD;  Location: [facility] OR;  Service: General;  Laterality: Bilateral;  Left Femoral, Left Recurrent inguinal, Right Inguinal  . INSERTION OF MESH Bilateral 06/01/2013   Procedure: INSERTION OF MESH;  Surgeon: Ardeth Sportsman, MD;  Location: [facility] OR;  Service: General;  Laterality: Bilateral;  Left Femoral, Left Recurrent inguinal, Right Inguinal  . LAPAROSCOPIC LYSIS OF ADHESIONS Bilateral 06/01/2013   Procedure: LAPAROSCOPIC LYSIS OF ADHESIONS;  Surgeon: Ardeth Sportsman, MD;  Location: [facility] OR;  Service: General;  Laterality: Bilateral;  .  LUNG REMOVAL, PARTIAL  Sept. 2011    Dr. Edwyna Shell  . ROTATOR CUFF REPAIR     Bilateral repair     Allergies  No Known Allergies  Inpatient Medications    . insulin aspart  0-9 Units Subcutaneous Q4H  . metoprolol tartrate  50 mg Oral BID  . piperacillin-tazobactam (ZOSYN)  IV  3.375 g Intravenous Q8H  . vancomycin  750 mg Intravenous Q12H  . zolpidem  5 mg Oral QHS    Family History    Family History  Problem Relation Age of Onset  .  Heart disease Brother     Heart Disease before age 40  . Heart attack Brother     Social History    Social History   Social History  . Marital status: Divorced    Spouse name: N/A  . Number of children: N/A  . Years of education: N/A   Occupational History  . Not on file.   Social History Main Topics  . Smoking status: Former Smoker    Quit date: 05/31/1973  . Smokeless tobacco: Never Used  . Alcohol use No  . Drug use: No  . Sexual activity: Not on file   Other Topics Concern  . Not on file   Social History Narrative  . No narrative on file     Review of Systems    General:  No chills, fever, night sweats. Positive for weight loss.  Cardiovascular:  No chest pain, dyspnea on exertion, edema, orthopnea, palpitations, paroxysmal nocturnal dyspnea. Dermatological: No rash, lesions/masses Respiratory: No cough, dyspnea Urologic: No hematuria, dysuria Abdominal:   No vomiting, diarrhea, bright red blood per rectum, melena, or hematemesis. Positive for nausea.  Neurologic:  No visual changes, changes in mental status. All other systems reviewed and are otherwise negative except as noted above.  Physical Exam    Blood pressure (!) 121/59, pulse (!) 140, temperature 98.4 F (36.9 C), temperature source Oral, resp. rate (!) 22, height 5\' 9"  (1.753 m), weight 162 lb 7.7 oz (73.7 kg), SpO2 95 %.  General: Pleasant, Caucasian male appearing in NAD. Does have frequent hiccups throughout the encounter (occurring for months according to the patient). Psych: Normal affect. Neuro: Alert and oriented X 3. Moves all extremities spontaneously. HEENT: Normal  Neck: Supple without bruits or JVD. Lungs:  Resp regular and unlabored, CTA without wheezing or rales. Heart: RRR no s3, s4, or murmurs. Abdomen: Soft, non-tender, non-distended, BS + x 4.  Extremities: No clubbing, cyanosis or edema. DP/PT/Radials 2+ and equal bilaterally.  Labs    Troponin (Point of Care Test) No  results for input(s): TROPIPOC in the last 72 hours.  Recent Labs  04/23/16 1852 04/24/16 0305  TROPONINI 0.17* 0.09*   Lab Results  Component Value Date   WBC 22.7 (H) 04/23/2016   HGB 12.8 (L) 04/23/2016   HCT 38.4 (L) 04/23/2016   MCV 97.0 04/23/2016   PLT 211 04/23/2016    Recent Labs Lab 04/23/16 0507  04/24/16 0305  NA 134*  < > 132*  K 3.6  < > 3.1*  CL 106  < > 104  CO2 20*  < > 21*  BUN 35*  < > 24*  CREATININE 1.27*  < > 1.03  CALCIUM 8.7*  < > 8.2*  PROT 5.7*  --   --   BILITOT 1.7*  --   --   ALKPHOS 56  --   --   ALT 27  --   --  AST 21  --   --   GLUCOSE 125*  < > 99  < > = values in this interval not displayed. Lab Results  Component Value Date   TRIG 179 (H) 03/28/2016   No results found for: [facility]   Radiology Studies    Ct Abdomen Pelvis Wo Contrast  Result Date: 04/22/2016 CLINICAL DATA:  Leukocytosis, sepsis. History of recent pancreatitis. EXAM: CT ABDOMEN AND PELVIS WITHOUT CONTRAST TECHNIQUE: Multidetector CT imaging of the abdomen and pelvis was performed following the standard protocol without IV contrast. COMPARISON:  CT from 04/03/2016 FINDINGS: Lower chest: Bibasilar atelectasis. Stable scarring at the right base. No pneumonic consolidation or pneumothorax. Old right sixth and seventh rib fractures. Normal size cardiac chambers without significant pericardial effusion. Coronary arteriosclerosis is seen. Hepatobiliary: Hepatic steatosis. No space-occupying mass of the liver though assessment is limited by lack of IV contrast. Uncomplicated cholelithiasis. No biliary dilatation. Pancreas: The pancreas line is difficult to discretely identify due to the surrounding peripancreatic fatty inflammation and edema. This appears overall stable without apparent findings of pancreatic necrosis nor pseudocyst formation. Spleen: Normal in size without focal abnormality. Adrenals/Urinary Tract: Normal normal bilateral adrenal glands. Two punctate interpolar  right renal calculi without obstructive uropathy. Apparent passage of the left UVJ stone since prior exam. No hydroureteronephrosis is noted. Stable bilateral perinephric fat stranding. Stomach/Bowel: Stomach is moderately distended with enteric contrast. No bowel obstruction or definite inflammation. Extensive colonic diverticulosis is noted along the colon, in particular involving the sigmoid colon. No findings of acute diverticulitis. No definite abscess. Colonic interposition of the liver seen. There is a normal-appearing appendix. Vascular/Lymphatic: Aortic and branch vessel atherosclerosis. No intraperitoneal, retroperitoneal, pelvic sidewall or inguinal lymphadenopathy. Reproductive: The prostate is enlarged with calcifications as before at the junction of the central and peripheral zone. Other: No abdominal wall hernia or abnormality. No abdominopelvic ascites. Musculoskeletal: No worrisome lytic or sclerotic osseous lesions. IMPRESSION: 1. Stable sequela of pancreatitis without pancreatic necrosis, definite abscess on this unenhanced study nor pseudocyst formation. 2. Uncomplicated cholelithiasis. 3. Passage of left UVJ stone since prior exam. 4. Stable hepatic steatosis. 5. Abdominal aortic atherosclerosis. Electronically Signed   By: Tollie Eth M.D.   On: 04/22/2016 18:07   Dg Chest 2 View  Result Date: 04/22/2016 CLINICAL DATA:  Strain fatigue and left G which began earlier today. History of diabetes and hypertension and unspecified lung surgery. Former smoker. EXAM: CHEST  2 VIEW COMPARISON:  Portable chest x-ray of March 28, 2016 FINDINGS: There is mild chronic volume loss on the right with elevation of the hemidiaphragm. The aerated portion of the right lung is clear. The left lung is clear. The heart and pulmonary vascularity are normal. There is old deformity of the posterior aspect of the right sixth and seventh ribs. There is degenerative change of the left shoulder. The thoracic spine  exhibits mild multilevel degenerative disc space narrowing. IMPRESSION: Chronic volume loss on the right with mild elevation of the hemidiaphragm. No evidence of pneumonia, CHF, nor other acute cardiopulmonary abnormality. Electronically Signed   By: David  [place] M.D.   On: 04/22/2016 15:39   Dg Abd 1 View  Result Date: 03/28/2016 CLINICAL DATA:  Feeding tube placement EXAM: ABDOMEN - 1 VIEW COMPARISON:  None. FINDINGS: Weighted tip feeding tube appears well positioned with tip in the expected region of the proximal transverse portion of the duodenum. Oral contrast material noted within the nondistended colon. No evidence of soft tissue mass or abnormal fluid collection. Degenerative changes  noted throughout the scoliotic thoracolumbar spine. No acute or suspicious osseous finding. IMPRESSION: Weighted tip feeding tube appears well positioned with tip in the expected region of the proximal transverse portion of the duodenum. Electronically Signed   By: Bary Richard M.D.   On: 03/28/2016 12:51   Dg Abd 1 View  Result Date: 03/27/2016 CLINICAL DATA:  Encounter for feeding tube placement. EXAM: ABDOMEN - 1 VIEW COMPARISON:  CT 03/26/2016 FINDINGS: Feeding tube is coiled in the stomach. The tip is at the gastric antrum. Oral contrast in the colon. Nonobstructive bowel gas pattern. Elevation of the right hemidiaphragm. IMPRESSION: Feeding tube tip at the gastric antrum. Electronically Signed   By: Richarda Overlie M.D.   On: 03/27/2016 11:22   Ct Abdomen Pelvis W Contrast  Result Date: 04/03/2016 CLINICAL DATA:  Pancreatitis. EXAM: CT ABDOMEN AND PELVIS WITH CONTRAST TECHNIQUE: Multidetector CT imaging of the abdomen and pelvis was performed using the standard protocol following bolus administration of intravenous contrast. CONTRAST:  ISOVUE-300 IOPAMIDOL (ISOVUE-300) INJECTION 61% COMPARISON:  03/26/2016 FINDINGS: Lower chest:  Stable areas of scarring right base. Hepatobiliary: The liver shows  diffusely decreased attenuation suggesting steatosis. Gallstones again noted. No intrahepatic or extrahepatic biliary dilation. Pancreas: Interval slight progression of diffuse peripancreatic edema/inflammation. Pancreas enhances throughout with no overt findings of pancreatic necrosis. No evidence for organized pseudocyst or rim enhancing abscess. Spleen: No splenomegaly. No focal mass lesion. Adrenals/Urinary Tract: No adrenal nodule or mass. No evidence for enhancing lesion in either kidney. No evidence for hydroureter. Left UVJ stone is unchanged. The urinary bladder appears normal for the degree of distention. Stomach/Bowel: Stomach is distended with fluid. A feeding tube tip is positioned in the transverse duodenum. Duodenum is mildly distended. No small bowel wall thickening. No small bowel dilatation. The terminal ileum is normal. The appendix is normal. Diverticuli are seen scattered along the entire length of the colon without CT findings of diverticulitis. Vascular/Lymphatic: There is abdominal aortic atherosclerosis without aneurysm. As before, there is marked mass-effect/attenuation of the superior mesenteric vein just proximal to the porta splenic confluence. Mass-effect on the splenic vein has increased in the interval although the vessel does remain patent. No evidence for pseudoaneurysm. Celiac axis and SMA opacified. There is no gastrohepatic or hepatoduodenal ligament lymphadenopathy. No intraperitoneal or retroperitoneal lymphadenopathy. No pelvic sidewall lymphadenopathy. Reproductive: Prostate gland is enlarged. Other: Trace intraperitoneal free fluid noted in the pelvis. Musculoskeletal: Bone windows reveal no worrisome lytic or sclerotic osseous lesions. IMPRESSION: 1. Slight interval progression of diffuse peripancreatic edema/inflammation. No evidence for pancreatic necrosis. No organized pseudocyst or rim enhancing fluid collection to suggest abscess. 2. Mass-effect on the superior  mesenteric vein and splenic vein, slightly progressed in the interval although both vessels remain patent. 3. Interval decrease in intraperitoneal free fluid. 4. Cholelithiasis. 5. Stable appearance left UVJ stone without left hydroureteronephrosis. 6. Hepatic steatosis. 7. Interval resolution of left pleural effusion with persistent tiny right pleural effusion. 8. Abdominal aortic atherosclerosis. Electronically Signed   By: Kennith Center M.D.   On: 04/03/2016 17:05   Ct Abdomen Pelvis W Contrast  Result Date: 03/26/2016 CLINICAL DATA:  82 year old with pancreatitis. History of periumbilical and lower abdominal pain. Abnormal LFTs. EXAM: CT ABDOMEN AND PELVIS WITH CONTRAST TECHNIQUE: Multidetector CT imaging of the abdomen and pelvis was performed using the standard protocol following bolus administration of intravenous contrast. CONTRAST:  ISOVUE-300 IOPAMIDOL (ISOVUE-300) INJECTION 61% COMPARISON:  CT 03/23/2016.  MRI 03/24/2016 FINDINGS: Lower chest: Pleural-based calcified granuloma at the left  lung base. Small bilateral pleural effusions with bibasilar atelectasis. Coronary artery calcifications. Hepatobiliary: Liver is diffusely low density and suggestive for steatosis. Portal venous system is patent. High-density material in the gallbladder could be related to sludge or vicarious contrast excretion from previous CT imaging. Evidence for gallstones. No significant gallbladder distension. Pancreas: There is diffuse edema and fluid surrounding the entire pancreas. The amount of edema and fluid has markedly increased since 03/23/2016. No large areas of pancreas necrosis. Spleen: Normal appearance of spleen without enlargement. Splenic vein is patent. Adrenals/Urinary Tract: Stable thickening of the left adrenal gland. Normal appearance of the right adrenal gland. There may be 1 or 2 small nonobstructive stones in the right kidney along with small cysts. Probable cyst in the left kidney interpolar  region without hydronephrosis. Again noted is a 3 mm stone at the left ureterovesical junction. There is no significant left ureter dilatation. Minimal wall thickening along the right side of bladder is nonspecific. Stomach/Bowel: Large amount of edema and fluid surrounding the duodenum. There is mild dilatation of small bowel loops without obstruction. Increased fluid within the central abdominal mesentery. Colonic diverticulosis without acute colonic inflammation. Normal appearance of the appendix. Vascular/Lymphatic: The abdominal aorta is heavily calcified without aneurysm. There is flow in the major visceral arteries. Iliac arteries are heavily calcified but patent. Splenic vein and portal vein are patent. The portal confluence is patent. However, there is narrowing of the proximal SMV seen on both phases of imaging. This is demonstrated on sequence 3, image 37 and felt to be related to compression rather than thrombus at this time. Renal veins and IVC are patent. No enlarged lymph nodes in the abdomen or pelvis. Reproductive: Prostate is prominent measuring up to 6.2 cm with calcifications. Other: There is a large amount of fluid in the pelvis. There is slightly more dense fluid along the right side of the pelvis suggestive for blood products. In addition, some of the abdominal fluid is dense measuring up to 34 Hounsfield units. Findings are compatible with blood products. No free air. Musculoskeletal: Disc space loss at L2-L3. Multilevel disc disease in lumbar spine with bilateral facet arthropathy. IMPRESSION: Progression of the pancreatitis with a large amount of fluid and edema surrounding the pancreas. In addition, some of this fluid is hyperdense and suggestive for blood products. Findings are compatible with hemorrhagic pancreatitis. No large areas of pancreatic necrosis or defined pseudocyst formations. Splenic vein and portal venous system are patent but there is compression and narrowing of the  proximal superior mesenteric vein. This vein is at risk for thrombosis. Increased fluid in the pelvis with some blood products. Cholelithiasis. Nonobstructive 3 mm stone at the left ureterovesical junction. Mild bladder wall thickening along the right side is nonspecific. Prostate hypertrophy. Probable right kidney stones. Consider a non emergent Urology consultation. Small bilateral pleural effusions with atelectasis at the lung bases. Hepatic steatosis. These results will be called to the ordering clinician or representative by the Radiologist Assistant, and communication documented in the PACS or zVision Dashboard. Electronically Signed   By: Richarda OverlieAdam  Henn M.D.   On: 03/26/2016 19:34   Dg Chest Port 1 View  Result Date: 04/24/2016 CLINICAL DATA:  Short of breath EXAM: PORTABLE CHEST 1 VIEW COMPARISON:  04/22/2016 FINDINGS: Normal heart size. Ovoid density at the right apex is stable. Lungs are very under aerated but otherwise grossly clear. Chronic right rib deformities. No pneumothorax. IMPRESSION: Stable examination.  Stable ovoid density at the right apex. Electronically Signed   By:  Jolaine ClickArthur  Hoss M.D.   On: 04/24/2016 07:56   Dg Chest Port 1 View  Result Date: 03/28/2016 CLINICAL DATA:  Acute onset of wheezing.  Initial encounter. EXAM: PORTABLE CHEST 1 VIEW COMPARISON:  Chest radiograph performed 05/31/2013 FINDINGS: The lungs are hypoexpanded, with elevation of the right hemidiaphragm. No significant pleural effusion or pneumothorax is seen. The cardiomediastinal silhouette remains normal in size. A right PICC is noted ending about the mid to distal SVC. No acute osseous abnormalities are seen. IMPRESSION: Lungs hypoexpanded, with elevation of the right hemidiaphragm. Electronically Signed   By: Roanna RaiderJeffery  Chang M.D.   On: 03/28/2016 01:48   Dg Naso/oro Frutoso ChaseGtube Thru Duo-reposition  Result Date: 03/27/2016 INDICATION: Malnutition EXAM: IR NASO/ORO GTUBE THRU DUO - REPOSITION COMPARISON:  Abdominal  radiograph earlier same day CONTRAST:  15 cc Isovue FLUOROSCOPY TIME:  13 minutes. 34 seconds. COMPLICATIONS: None immediate PROCEDURE: Under intermittent fluoroscopic guidance, the Dobhoff tube was manipulated in the stomach. The tip would not pass distal to the gastric antrum into the proximal duodenum. The patient tolerated the procedure well without immediate postprocedural complication. FINDINGS: The feeding tube tip is present at the gastric antrum. IMPRESSION: Multiple attempts were made to pass the tube into the duodenum however were unsuccessful. The tube was left with the tip at the gastric antrum. Recommend follow-up KUB to assess for interval passage into the duodenum. These results will be called to the ordering clinician or representative by the Radiologist Assistant, and communication documented in the PACS or zVision Dashboard. Electronically Signed   By: Annia Beltrew  Davis M.D.   On: 03/27/2016 15:28    EKG & Cardiac Imaging    EKG:  04/22/2016: Sinus tachycardia, HR 120, with occasional PVC's. 04/23/2016: SVT, HR of 146 with LAD  Echocardiogram: None on File  Assessment & Plan    1. Narrow-Complex Tachycardia - currently admitted with Sepsis likely secondary to UTI.  - EKG on admission showed sinus tachycardia, HR 120, with occasional PVC's. Has continued to have episodes of tachycardia on telemetry with HR ranging from 80's - 150's. Telemetry seems most consistent with episodes of SVT with frequent PAC's and PVC's along with sinus tachycardia (as p-waves are present on strips when HR is in the 130's). P-waves are difficult to distinguish with HR in the 150's. EKG yesterday consistent with SVT. Will review with MD to make sure no episodes of PAF are noted but by first review his strips do not seem consistent with this. - denies any chest discomfort or palpitations. No past history of cardiac arrhythmias.   - agree with increasing PO Lopressor dosing from 25mg  BID to 50mg  BID. Would avoid  Digoxin for now with AKI. May need to consider addition of IV Amiodarone if HR remains consistently elevated.   2. Elevated Troponin - cyclic troponin values have been 0.09 and 0.17 thus far. - he denies any recent episodes of chest discomfort or dyspnea with exertion. No prior cardiac history. - likely secondary to demand ischemia in the setting of his SVT, sepsis, and AKI. Would obtain echocardiogram for initial assessment of LV function and wall motion, granted HR will need to be stable prior to this being obtained for improved image quality.  3. HTN - BP has been variable at 76/39 - 142/80 in the past 24 hours. - Lopressor dosing as above. Hold for SBP < 100.  4. Sepsis - WBC elevated to 30.6 on admission with Lactic Acid of 3.14. Started on IVF and broad-spectrum antibiotics. -  Not  thought to be secondary to his pancreatitis as Lipase is normal this admission. Likely due to Urosepsis with urine culture being positive for Enterococcus Faecalis.  - per admitting team  5. Pancreatitis - admitted from 03/23/2016 - 04/07/2016 for acute hemorrhagic pancreatitis thought to be secondary to gallstones. - surgery currently following and planning for cholecystectomy as inpatient vs. outpatient.   6. AKI - creatinine 2.03 on admission (was 0.69 two weeks prior). - improved to 1.27 today.    Signed, Ellsworth Lennox, PA-C 04/24/2016, 9:45 AM Pager: [phone]  The patient has been seen in conjunction with Randall An, PA-C. All aspects of care have been considered and discussed. The patient has been personally interviewed, examined, and all clinical data has been reviewed.   Agree with note and assessment as outlined above. Tachycardia is present, most of which I believe is related to acute illness and comprises sinus tachycardia, multifocal atrial tachycardia, and cannot totally exclude the possibility of some brief A. Fib.  Exam is unremarkable. No significant murmurs heard. No  rub is heard  Overall, the patient is having supraventricular arrhythmia, related to acute illness. The rhythm variety is noted above.  Plan to titrate beta blocker therapy and follow. 2-D Doppler echocardiogram should be performed. We will follow with you. I would continue DVT prophylaxis. Would start IV heparin if definite atrial fibrillation occurs.

## 2016-04-24 NOTE — Progress Notes (Signed)
Peripherally Inserted Central Catheter/Midline Placement  The IV Nurse has discussed with the patient and/or persons authorized to consent for the patient, the purpose of this procedure and the potential benefits and risks involved with this procedure.  The benefits include less needle sticks, lab draws from the catheter, and the patient may be discharged home with the catheter. Risks include, but not limited to, infection, bleeding, blood clot (thrombus formation), and puncture of an artery; nerve damage and irregular heartbeat and possibility to perform a PICC exchange if needed/ordered by physician.  Alternatives to this procedure were also discussed.  Bard Power PICC patient education guide, fact sheet on infection prevention and patient information card has been provided to patient /or left at bedside.    PICC/Midline Placement Documentation  PICC Triple Lumen 04/24/16 PICC Right Basilic 41 cm 2 cm (Active)  Indication for Insertion or Continuance of Line Prolonged intravenous therapies 04/24/2016  1:00 PM  Exposed Catheter (cm) 2 cm 04/24/2016  1:00 PM  Dressing Change Due 05/01/16 04/24/2016  1:00 PM       Stacie GlazeJoyce, Ralston Venus Horton 04/24/2016, 1:21 PM

## 2016-04-25 ENCOUNTER — Inpatient Hospital Stay (HOSPITAL_COMMUNITY): Payer: Medicare Other

## 2016-04-25 DIAGNOSIS — I9581 Postprocedural hypotension: Secondary | ICD-10-CM

## 2016-04-25 DIAGNOSIS — R9431 Abnormal electrocardiogram [ECG] [EKG]: Secondary | ICD-10-CM

## 2016-04-25 DIAGNOSIS — R6520 Severe sepsis without septic shock: Secondary | ICD-10-CM

## 2016-04-25 DIAGNOSIS — R748 Abnormal levels of other serum enzymes: Secondary | ICD-10-CM

## 2016-04-25 DIAGNOSIS — A419 Sepsis, unspecified organism: Principal | ICD-10-CM

## 2016-04-25 DIAGNOSIS — R0602 Shortness of breath: Secondary | ICD-10-CM

## 2016-04-25 DIAGNOSIS — R7989 Other specified abnormal findings of blood chemistry: Secondary | ICD-10-CM

## 2016-04-25 DIAGNOSIS — I9589 Other hypotension: Secondary | ICD-10-CM

## 2016-04-25 DIAGNOSIS — R778 Other specified abnormalities of plasma proteins: Secondary | ICD-10-CM

## 2016-04-25 LAB — ECHOCARDIOGRAM COMPLETE
CHL CUP MV DEC (S): 257
E/e' ratio: 5.63
EWDT: 257 ms
FS: 11 % — AB (ref 28–44)
Height: 69 in
IV/PV OW: 0.91
LA ID, A-P, ES: 44 mm
LA diam end sys: 44 mm
LA diam index: 2.33 cm/m2
LAVOL: 84.1 mL
LAVOLA4C: 95 mL
LAVOLIN: 44.5 mL/m2
LV E/e'average: 5.63
LV TDI E'LATERAL: 11.5
LV e' LATERAL: 11.5 cm/s
LVEEMED: 5.63
LVOT SV: 36 mL
LVOT VTI: 12.6 cm
LVOT area: 2.84 cm2
LVOT diameter: 19 mm
LVOTPV: 56.7 cm/s
MRPISAEROA: 0.04 cm2
MVPKAVEL: 74.4 m/s
MVPKEVEL: 64.8 m/s
PW: 10.3 mm — AB (ref 0.6–1.1)
TAPSE: 15.2 mm
TDI e' medial: 5.44
VTI: 178 cm
Weight: 2599.66 oz

## 2016-04-25 LAB — GLUCOSE, CAPILLARY
GLUCOSE-CAPILLARY: 155 mg/dL — AB (ref 65–99)
Glucose-Capillary: 133 mg/dL — ABNORMAL HIGH (ref 65–99)
Glucose-Capillary: 142 mg/dL — ABNORMAL HIGH (ref 65–99)
Glucose-Capillary: 120 mg/dL — ABNORMAL HIGH (ref 65–99)
Glucose-Capillary: 133 mg/dL — ABNORMAL HIGH (ref 65–99)
Glucose-Capillary: 167 mg/dL — ABNORMAL HIGH (ref 65–99)
Glucose-Capillary: 195 mg/dL — ABNORMAL HIGH (ref 65–99)

## 2016-04-25 LAB — CBC
HCT: 32 % — ABNORMAL LOW (ref 39.0–52.0)
Hemoglobin: 11 g/dL — ABNORMAL LOW (ref 13.0–17.0)
MCH: 32.3 pg (ref 26.0–34.0)
MCHC: 34.4 g/dL (ref 30.0–36.0)
MCV: 93.8 fL (ref 78.0–100.0)
PLATELETS: 161 10*3/uL (ref 150–400)
RBC: 3.41 MIL/uL — ABNORMAL LOW (ref 4.22–5.81)
RDW: 13.2 % (ref 11.5–15.5)
WBC: 16.6 10*3/uL — ABNORMAL HIGH (ref 4.0–10.5)

## 2016-04-25 LAB — BASIC METABOLIC PANEL
Anion gap: 4 — ABNORMAL LOW (ref 5–15)
BUN: 21 mg/dL — AB (ref 6–20)
CALCIUM: 7.9 mg/dL — AB (ref 8.9–10.3)
CO2: 24 mmol/L (ref 22–32)
CREATININE: 0.76 mg/dL (ref 0.61–1.24)
Chloride: 104 mmol/L (ref 101–111)
GFR calc Af Amer: >60 mL/min (ref >60)
GFR calc non Af Amer: >60 mL/min (ref >60)
GLUCOSE: 135 mg/dL — AB (ref 65–99)
Potassium: 3.3 mmol/L — ABNORMAL LOW (ref 3.5–5.1)
Sodium: 132 mmol/L — ABNORMAL LOW (ref 135–145)

## 2016-04-25 LAB — MAGNESIUM: Magnesium: 1.6 mg/dL — ABNORMAL LOW (ref 1.7–2.4)

## 2016-04-25 MED ORDER — PANCRELIPASE (LIP-PROT-AMYL) 12000-38000 UNITS PO CPEP
36000.0000 [IU] | ORAL_CAPSULE | Freq: Three times a day (TID) | ORAL | Status: DC
  Administered 2016-04-25 – 2016-04-28 (×11): 36000 [IU] via ORAL
  Filled 2016-04-25: qty 1
  Filled 2016-04-25 (×3): qty 3
  Filled 2016-04-25: qty 1
  Filled 2016-04-25: qty 3
  Filled 2016-04-25: qty 1
  Filled 2016-04-25: qty 3
  Filled 2016-04-25: qty 1
  Filled 2016-04-25 (×2): qty 3

## 2016-04-25 MED ORDER — GLUCERNA SHAKE PO LIQD
237.0000 mL | Freq: Three times a day (TID) | ORAL | Status: DC
  Administered 2016-04-25 – 2016-04-28 (×8): 237 mL via ORAL
  Filled 2016-04-25 (×11): qty 237

## 2016-04-25 MED ORDER — POLYVINYL ALCOHOL 1.4 % OP SOLN
1.0000 [drp] | OPHTHALMIC | Status: DC | PRN
  Administered 2016-04-25: 1 [drp] via OPHTHALMIC
  Filled 2016-04-25: qty 15

## 2016-04-25 MED ORDER — METOPROLOL TARTRATE 5 MG/5ML IV SOLN
5.0000 mg | Freq: Four times a day (QID) | INTRAVENOUS | Status: DC | PRN
  Administered 2016-04-25 – 2016-04-26 (×3): 5 mg via INTRAVENOUS
  Filled 2016-04-25 (×3): qty 5

## 2016-04-25 MED ORDER — PERFLUTREN LIPID MICROSPHERE
1.0000 mL | INTRAVENOUS | Status: AC | PRN
  Administered 2016-04-25: 2 mL via INTRAVENOUS
  Filled 2016-04-25: qty 10

## 2016-04-25 MED ORDER — AMOXICILLIN-POT CLAVULANATE 875-125 MG PO TABS
1.0000 | ORAL_TABLET | Freq: Two times a day (BID) | ORAL | Status: DC
  Administered 2016-04-25 – 2016-04-28 (×7): 1 via ORAL
  Filled 2016-04-25 (×7): qty 1

## 2016-04-25 MED ORDER — METOCLOPRAMIDE HCL 5 MG PO TABS
5.0000 mg | ORAL_TABLET | Freq: Three times a day (TID) | ORAL | Status: DC | PRN
  Administered 2016-04-25: 10 mg via ORAL
  Administered 2016-04-25 (×2): 5 mg via ORAL
  Administered 2016-04-26 – 2016-04-27 (×4): 10 mg via ORAL
  Filled 2016-04-25: qty 1
  Filled 2016-04-25 (×4): qty 2
  Filled 2016-04-25: qty 1
  Filled 2016-04-25: qty 2

## 2016-04-25 NOTE — Progress Notes (Addendum)
[facility] PROGRESS NOTE    Progress Note  Colin HasGraham N Cando  JWJ:[id number] DOB: [birth year] DOA: 04/22/2016 PCP: Verl BangsADIONTCHENKO, ALEXEI, MD     Brief Narrative:   Colin West is an 82 y.o. male with history of HTN o/w healthy admitted here 2 weeks ago with acute pancreatitis and gallstones, felt to be gallstone pancreatitis.  He had some hemorrhagic changes by CT, recovered slowly, was Rx with Imipenem, NG tube feeds.  WBC high, eventually improved and pt was dc'd on 04/07/16.  Plan was to have GB removed at a later date.  Pt here now with gen'd weakness for 3-4 days.  Assessment/Plan:   Severe Sepsis due to E. Coli pyelonephritis: Urine culture grew out more than 100,000 colonies of enterococcus species pansensitive. His lipase was normal. Continue strict I's and O's, will change IV antibiotics to oral ampicillin. Blood cultures remain negative till date. Continue to monitor electrolytes replete his needed. Transfer to telemetry floor.  Acute kidney injury: In setting of severe sepsis likely prerenal, resolved with IV fluid hydration. No JVD physical exam lungs are clear. Follow strict I's and O's.  Essential hypertension Continue hold antihypertensive medication. Blood pressure is stable.  Cholelithiasis Surgery following an appreciate assistance.    Leukocytosis I will de-escalate antibiotic coverage to oral ampicillin.  Narrow complex tachycardia: Likely due to Sepsis, cont oral metoprolol to 50 mg, continue Toprol IV when necessary for heart rate greater than 110. Echo is pending. appreciate cards assistance.   DVT prophylaxis: lovenox Family Communication:none Disposition Plan/Barrier to D/C: Transfer to telemetry Code Status:     Code Status Orders        Start     Ordered   04/22/16 2306  Full code  Continuous     04/22/16 2305    Code Status History    Date Active Date Inactive Code Status Order ID Comments User Context   03/23/2016  2:48 PM  04/07/2016  5:03 PM DNR [id number]  Meredeth IdeGagan S Lama, MD Inpatient        IV Access:    Peripheral IV   Procedures and diagnostic studies:   Dg Chest Port 1 View  Result Date: 04/24/2016 CLINICAL DATA:  Central line placement EXAM: PORTABLE CHEST 1 VIEW COMPARISON:  04/24/2016 400 hours FINDINGS: Right upper extremity PICC placed. Tip is at the cavoatrial junction. Low volumes. Stable ovoid density towards the right apex. Normal heart size. No pneumothorax. IMPRESSION: Right upper extremity PICC placed. Tip is at the cavoatrial junction. Electronically Signed   By: Jolaine ClickArthur  Hoss M.D.   On: 04/24/2016 13:31   Dg Chest Port 1 View  Result Date: 04/24/2016 CLINICAL DATA:  Short of breath EXAM: PORTABLE CHEST 1 VIEW COMPARISON:  04/22/2016 FINDINGS: Normal heart size. Ovoid density at the right apex is stable. Lungs are very under aerated but otherwise grossly clear. Chronic right rib deformities. No pneumothorax. IMPRESSION: Stable examination.  Stable ovoid density at the right apex. Electronically Signed   By: Jolaine ClickArthur  Hoss M.D.   On: 04/24/2016 07:56     Medical Consultants:    None.  Anti-Infectives:   Vancomycin and Zosyn started on admission.  Subjective:    Colin HasGraham N Stahlman feels great and he is hungry he would like to walk.  Objective:    Vitals:   04/25/16 0400 04/25/16 0500 04/25/16 0600 04/25/16 0705  BP: (!) 127/52 (!) 111/56 125/66   Pulse: 93 95 93   Resp: (!) 23 (!) 24 (!) 22   Temp:  98.2 F (36.8 C)  TempSrc:    Oral  SpO2: 94% 92% 95%   Weight:      Height:        Intake/Output Summary (Last 24 hours) at 04/25/16 0828 Last data filed at 04/25/16 0600  Gross per 24 hour  Intake           2032.5 ml  Output              375 ml  Net           1657.5 ml   Filed Weights   04/22/16 1454 04/22/16 1547 04/24/16 0500  Weight: 69.4 kg (153 lb) 70.3 kg (155 lb) 73.7 kg (162 lb 7.7 oz)    Exam: General exam: In no acute distress. Respiratory system:  Good air movement and clear to auscultation. Cardiovascular system: S1 & S2 heard, RRR.  Gastrointestinal system: Abdomen is nondistended, soft and nontender.  Central nervous system: Alert and oriented. No focal neurological deficits. Extremities: No pedal edema. Skin: No rashes, lesions or ulcers Psychiatry: Judgement and insight appear normal. Mood & affect appropriate.    Data Reviewed:    Labs: Basic Metabolic Panel:  Recent Labs Lab 04/22/16 1554 04/23/16 0507 04/23/16 1852 04/24/16 0305 04/25/16 0305  NA 131* 134* 133* 132* 132*  K 4.9 3.6 3.6 3.1* 3.3*  CL 98* 106 104 104 104  CO2 23 20* 22 21* 24  GLUCOSE 249* 125* 112* 99 135*  BUN 45* 35* 25* 24* 21*  CREATININE 2.03* 1.27* 1.08 1.03 0.76  CALCIUM 9.7 8.7* 8.4* 8.2* 7.9*  MG  --   --   --   --  1.6*   GFR Estimated Creatinine Clearance: 73.6 mL/min (by C-G formula based on SCr of 0.76 mg/dL). Liver Function Tests:  Recent Labs Lab 04/22/16 1554 04/23/16 0507  AST 26 21  ALT 34 27  ALKPHOS 70 56  BILITOT 2.1* 1.7*  PROT 7.5 5.7*  ALBUMIN 3.6 2.7*    Recent Labs Lab 04/22/16 1554  LIPASE 22  AMYLASE 30   No results for input(s): AMMONIA in the last 168 hours. Coagulation profile  Recent Labs Lab 04/23/16 0507  INR 1.39    CBC:  Recent Labs Lab 04/22/16 1554 04/23/16 0507 04/25/16 0305  WBC 30.6* 22.7* 16.6*  NEUTROABS 27.3*  --   --   HGB 15.3 12.8* 11.0*  HCT 44.3 38.4* 32.0*  MCV 96.5 97.0 93.8  PLT 266 211 161   Cardiac Enzymes:  Recent Labs Lab 04/23/16 1852 04/24/16 0305  TROPONINI 0.17* 0.09*   BNP (last 3 results) No results for input(s): PROBNP in the last 8760 hours. CBG:  Recent Labs Lab 04/24/16 1642 04/24/16 1959 04/24/16 2319 04/25/16 0336 04/25/16 0731  GLUCAP 119* 129* 133* 142* 120*   D-Dimer: No results for input(s): DDIMER in the last 72 hours. Hgb A1c: No results for input(s): HGBA1C in the last 72 hours. Lipid Profile: No results for  input(s): CHOL, HDL, LDLCALC, TRIG, CHOLHDL, LDLDIRECT in the last 72 hours. Thyroid function studies: No results for input(s): TSH, T4TOTAL, T3FREE, THYROIDAB in the last 72 hours.  Invalid input(s): FREET3 Anemia work up: No results for input(s): VITAMINB12, FOLATE, FERRITIN, TIBC, IRON, RETICCTPCT in the last 72 hours. Sepsis Labs:  Recent Labs Lab 04/22/16 1554 04/22/16 1608 04/22/16 1815 04/23/16 0507 04/23/16 0541 04/25/16 0305  WBC 30.6*  --   --  22.7*  --  16.6*  LATICACIDVEN  --  3.14* 3.05*  --  1.2  --    Microbiology Recent Results (from the past 240 hour(s))  Culture, blood (Routine x 2)     Status: None (Preliminary result)   Collection Time: 04/22/16  3:54 PM  Result Value Ref Range Status   Specimen Description BLOOD RIGHT ANTECUBITAL  Final   Special Requests BOTTLES DRAWN AEROBIC AND ANAEROBIC 5CC  Final   Culture   Final    NO GROWTH 2 DAYS Performed at [facility]    Report Status PENDING  Incomplete  Culture, blood (Routine x 2)     Status: None (Preliminary result)   Collection Time: 04/22/16  4:01 PM  Result Value Ref Range Status   Specimen Description BLOOD LEFT ANTECUBITAL  Final   Special Requests BOTTLES DRAWN AEROBIC AND ANAEROBIC 5CC  Final   Culture   Final    NO GROWTH 2 DAYS Performed at [facility]    Report Status PENDING  Incomplete  Urine culture     Status: Abnormal   Collection Time: 04/22/16  4:14 PM  Result Value Ref Range Status   Specimen Description URINE, CLEAN CATCH  Final   Special Requests NONE  Final   Culture >=100,000 COLONIES/mL ENTEROCOCCUS FAECALIS (A)  Final   Report Status 04/24/2016 FINAL  Final   Organism ID, Bacteria ENTEROCOCCUS FAECALIS (A)  Final      Susceptibility   Enterococcus faecalis - MIC*    AMPICILLIN <=2 SENSITIVE Sensitive     LEVOFLOXACIN 1 SENSITIVE Sensitive     NITROFURANTOIN <=16 SENSITIVE Sensitive     VANCOMYCIN 1 SENSITIVE Sensitive     * >=100,000 COLONIES/mL  ENTEROCOCCUS FAECALIS  MRSA PCR Screening     Status: None   Collection Time: 04/23/16 10:42 AM  Result Value Ref Range Status   MRSA by PCR NEGATIVE NEGATIVE Final    Comment:        The GeneXpert MRSA Assay (FDA approved for NASAL specimens only), is one component of a comprehensive MRSA colonization surveillance program. It is not intended to diagnose MRSA infection nor to guide or monitor treatment for MRSA infections.      Medications:   . insulin aspart  0-9 Units Subcutaneous Q4H  . metoprolol tartrate  50 mg Oral BID  . piperacillin-tazobactam (ZOSYN)  IV  3.375 g Intravenous Q8H  . sodium chloride flush  10-40 mL Intracatheter Q12H  . vancomycin  750 mg Intravenous Q12H  . zolpidem  5 mg Oral QHS   Continuous Infusions: . dextrose 5 % and 0.45% NaCl 1,000 mL with potassium chloride 40 mEq infusion 75 mL/hr at 04/24/16 2254    Time spent: 25 min   LOS: 3 days   Marinda Elk  [facility] Pager [phone]  *Please refer to [email or web address], password [email or web address] to get updated schedule on who will round on this patient, as hospitalists switch teams weekly. If 7PM-7AM, please contact night-coverage at [email or web address], password [email or web address] for any overnight needs.  04/25/2016, 8:28 AM

## 2016-04-25 NOTE — Progress Notes (Signed)
Subjective: No complaints.  Objective: Vital signs in last 24 hours: Temp:  [97.6 F (36.4 C)-99.9 F (37.7 C)] 98.2 F (36.8 C) (11/10 0705) Pulse Rate:  [88-140] 93 (11/10 0600) Resp:  [17-27] 22 (11/10 0600) BP: (91-135)/(47-76) 125/66 (11/10 0600) SpO2:  [92 %-96 %] 95 % (11/10 0600)    Intake/Output from previous day: 11/09 0701 - 11/10 0700 In: 2032.5 [I.V.:1632.5; IV Piggyback:400] Out: 375 [Urine:375] Intake/Output this shift: No intake/output data recorded.  Resp: clear to auscultation bilaterally Cardio: regular rate and rhythm and tachy GI: soft, nontender. no distension  Lab Results:   Recent Labs  04/23/16 0507 04/25/16 0305  WBC 22.7* 16.6*  HGB 12.8* 11.0*  HCT 38.4* 32.0*  PLT 211 161   BMET  Recent Labs  04/24/16 0305 04/25/16 0305  NA 132* 132*  K 3.1* 3.3*  CL 104 104  CO2 21* 24  GLUCOSE 99 135*  BUN 24* 21*  CREATININE 1.03 0.76  CALCIUM 8.2* 7.9*   PT/INR  Recent Labs  04/23/16 0507  LABPROT 17.2*  INR 1.39   ABG No results for input(s): PHART, HCO3 in the last 72 hours.  Invalid input(s): PCO2, PO2  Studies/Results: Dg Chest Port 1 View  Result Date: 04/24/2016 CLINICAL DATA:  Central line placement EXAM: PORTABLE CHEST 1 VIEW COMPARISON:  04/24/2016 400 hours FINDINGS: Right upper extremity PICC placed. Tip is at the cavoatrial junction. Low volumes. Stable ovoid density towards the right apex. Normal heart size. No pneumothorax. IMPRESSION: Right upper extremity PICC placed. Tip is at the cavoatrial junction. Electronically Signed   By: Jolaine ClickArthur  Hoss M.D.   On: 04/24/2016 13:31   Dg Chest Port 1 View  Result Date: 04/24/2016 CLINICAL DATA:  Short of breath EXAM: PORTABLE CHEST 1 VIEW COMPARISON:  04/22/2016 FINDINGS: Normal heart size. Ovoid density at the right apex is stable. Lungs are very under aerated but otherwise grossly clear. Chronic right rib deformities. No pneumothorax. IMPRESSION: Stable examination.   Stable ovoid density at the right apex. Electronically Signed   By: Jolaine ClickArthur  Hoss M.D.   On: 04/24/2016 07:56    Anti-infectives: Anti-infectives    Start     Dose/Rate Route Frequency Ordered Stop   04/25/16 1000  amoxicillin-clavulanate (AUGMENTIN) 875-125 MG per tablet 1 tablet     1 tablet Oral Every 12 hours 04/25/16 0847     04/23/16 1600  vancomycin (VANCOCIN) IVPB 750 mg/150 ml premix  Status:  Discontinued     750 mg 150 mL/hr over 60 Minutes Intravenous Every 24 hours 04/22/16 1705 04/23/16 1049   04/23/16 1200  vancomycin (VANCOCIN) IVPB 750 mg/150 ml premix  Status:  Discontinued     750 mg 150 mL/hr over 60 Minutes Intravenous Every 12 hours 04/23/16 1049 04/25/16 0852   04/23/16 0600  vancomycin (VANCOCIN) IVPB 750 mg/150 ml premix  Status:  Discontinued     750 mg 150 mL/hr over 60 Minutes Intravenous Every 12 hours 04/22/16 1602 04/22/16 1705   04/22/16 2200  piperacillin-tazobactam (ZOSYN) IVPB 3.375 g  Status:  Discontinued     3.375 g 12.5 mL/hr over 240 Minutes Intravenous Every 8 hours 04/22/16 1602 04/25/16 0852   04/22/16 1530  piperacillin-tazobactam (ZOSYN) IVPB 3.375 g  Status:  Discontinued     3.375 g 100 mL/hr over 30 Minutes Intravenous  Once 04/22/16 1516 04/22/16 1522   04/22/16 1530  vancomycin (VANCOCIN) IVPB 1000 mg/200 mL premix  Status:  Discontinued     1,000 mg 200 mL/hr over  60 Minutes Intravenous  Once 04/22/16 1516 04/22/16 1522   04/22/16 1530  vancomycin (VANCOCIN) IVPB 1000 mg/200 mL premix     1,000 mg 200 mL/hr over 60 Minutes Intravenous STAT 04/22/16 1522 04/22/16 1803   04/22/16 1530  piperacillin-tazobactam (ZOSYN) IVPB 3.375 g     3.375 g 100 mL/hr over 30 Minutes Intravenous STAT 04/22/16 1522 04/22/16 1639      Assessment/Plan: s/p * No surgery found * Advance diet  Continue augmentin for UTI Cardiology evaluating for SVT and elevated troponin No plan for surgery right now  LOS: 3 days    TOTH III,PAUL S 04/25/2016

## 2016-04-25 NOTE — Progress Notes (Signed)
PT Cancellation Note  Patient Details Name: Farris HasGraham N Matzek MRN: [id number] DOB: 1934/04/12   Cancelled Treatment:    Reason Eval/Treat Not Completed: Patient not medically ready (will check back in AM.)   Rada HayHill, Terral Cooks Elizabeth 04/25/2016, 4:05 PM  Blanchard KelchKaren Colson Barco PT [phone]

## 2016-04-25 NOTE — Progress Notes (Signed)
  Echocardiogram 2D Echocardiogram has been performed.  Janalyn HarderWest, Kennadie Brenner R 04/25/2016, 12:35 PM

## 2016-04-25 NOTE — Progress Notes (Signed)
Patient Name: Colin West Date of Encounter: 04/25/2016  Primary Cardiologist: New - [facility] Problem List     Active Problems:   Essential hypertension   Cholelithiasis   Severe sepsis (HCC)   Leukocytosis   Hypotension   Sinus tachycardia   History of acute pancreatitis    Subjective   Echocardiogram being performed. Denies any chest pain or palpitations. Feels he is doing better today than when we met yesterday.  Inpatient Medications    Scheduled Meds: . amoxicillin-clavulanate  1 tablet Oral Q12H  . insulin aspart  0-9 Units Subcutaneous Q4H  . lipase/protease/amylase  36,000 Units Oral TID AC  . metoprolol tartrate  50 mg Oral BID  . sodium chloride flush  10-40 mL Intracatheter Q12H  . zolpidem  5 mg Oral QHS   Continuous Infusions: . dextrose 5 % and 0.45% NaCl 1,000 mL with potassium chloride 40 mEq infusion 75 mL/hr at 04/24/16 2254   PRN Meds: HYDROmorphone (DILAUDID) injection, metoCLOPramide, metoprolol, morphine injection, ondansetron **OR** ondansetron (ZOFRAN) IV, perflutren lipid microspheres (DEFINITY) IV suspension, sodium chloride flush   Vital Signs    Vitals:   04/25/16 0400 04/25/16 0500 04/25/16 0600 04/25/16 0705  BP: (!) 127/52 (!) 111/56 125/66   Pulse: 93 95 93   Resp: (!) 23 (!) 24 (!) 22   Temp:    98.2 F (36.8 C)  TempSrc:    Oral  SpO2: 94% 92% 95%   Weight:      Height:        Intake/Output Summary (Last 24 hours) at 04/25/16 1216 Last data filed at 04/25/16 0800  Gross per 24 hour  Intake             1780 ml  Output              575 ml  Net             1205 ml   Filed Weights   04/22/16 1454 04/22/16 1547 04/24/16 0500  Weight: 153 lb (69.4 kg) 155 lb (70.3 kg) 162 lb 7.7 oz (73.7 kg)    Physical Exam    GEN: Elderly Caucasian male appearing in no acute distress. Frequent hiccups. HEENT: Grossly normal.  Neck: Supple, no JVD, carotid bruits, or masses. Cardiac: RRR, no murmurs, rubs, or  gallops. No clubbing, cyanosis, edema.  Radials/DP/PT 2+ and equal bilaterally.  Respiratory:  Respirations regular and unlabored, clear to auscultation bilaterally. GI: Soft, nontender, nondistended, BS + x 4. MS: no deformity or atrophy. Skin: warm and dry, no rash. Neuro:  Strength and sensation are intact. Psych: AAOx3.  Normal affect.  Labs    CBC  Recent Labs  04/22/16 1554 04/23/16 0507 04/25/16 0305  WBC 30.6* 22.7* 16.6*  NEUTROABS 27.3*  --   --   HGB 15.3 12.8* 11.0*  HCT 44.3 38.4* 32.0*  MCV 96.5 97.0 93.8  PLT 266 211 154   Basic Metabolic Panel  Recent Labs  04/24/16 0305 04/25/16 0305  NA 132* 132*  K 3.1* 3.3*  CL 104 104  CO2 21* 24  GLUCOSE 99 135*  BUN 24* 21*  CREATININE 1.03 0.76  CALCIUM 8.2* 7.9*  MG  --  1.6*   Liver Function Tests  Recent Labs  04/22/16 1554 04/23/16 0507  AST 26 21  ALT 34 27  ALKPHOS 70 56  BILITOT 2.1* 1.7*  PROT 7.5 5.7*  ALBUMIN 3.6 2.7*    Recent Labs  04/22/16 1554  LIPASE 22  AMYLASE 30   Cardiac Enzymes  Recent Labs  04/23/16 1852 04/24/16 0305  TROPONINI 0.17* 0.09*     Telemetry    NSR, HR in 80's - 90's. Episodes of ST with PAC's and PVC's with HR into the 130's at times.  - Personally Reviewed  ECG    SR, HR 98, with PAC's.  - Personally Reviewed  Radiology    Dg Chest Port 1 View  Result Date: 04/24/2016 CLINICAL DATA:  Central line placement EXAM: PORTABLE CHEST 1 VIEW COMPARISON:  04/24/2016 400 hours FINDINGS: Right upper extremity PICC placed. Tip is at the cavoatrial junction. Low volumes. Stable ovoid density towards the right apex. Normal heart size. No pneumothorax. IMPRESSION: Right upper extremity PICC placed. Tip is at the cavoatrial junction. Electronically Signed   By: Marybelle Killings M.D.   On: 04/24/2016 13:31   Dg Chest Port 1 View  Result Date: 04/24/2016 CLINICAL DATA:  Short of breath EXAM: PORTABLE CHEST 1 VIEW COMPARISON:  04/22/2016 FINDINGS: Normal heart  size. Ovoid density at the right apex is stable. Lungs are very under aerated but otherwise grossly clear. Chronic right rib deformities. No pneumothorax. IMPRESSION: Stable examination.  Stable ovoid density at the right apex. Electronically Signed   By: Marybelle Killings M.D.   On: 04/24/2016 07:56    Cardiac Studies   Echo: Pending  Patient Profile     82 yo male w/ PMH of of recent acute pancreatitis, cholelithiasis, HTN, and Type 2 DM who presented to [facility] ED on 04/22/2016 for generalized weakness. Diagnosed with Urosepsis. Cards consulted for tachycardia.   Assessment & Plan    1. Narrow-Complex Tachycardia - currently admitted with Sepsis likely secondary to UTI.  - EKG on admission showed sinus tachycardia, HR 120, with occasional PVC's. Has continued to have episodes of tachycardia on telemetry with HR ranging from 80's - 150's. Telemetry seems most consistent with episodes of SVT with frequent PAC's and PVC's along with sinus tachycardia (as p-waves are present on strips when HR is in the 130's).  - denies any chest discomfort or palpitations. No past history of cardiac arrhythmias.   - PO Lopressor increased from 21m BID to 546mBID. HR improved and peaking into the 130's. Mostly in the 80's - 90's. Would not further titrate BB dosing at this time with episodes of hypotension.   2. Elevated Troponin - cyclic troponin values have been 0.09 and 0.17.  - he denies any recent episodes of chest discomfort or dyspnea with exertion. No prior cardiac history. - likely secondary to demand ischemia in the setting of his SVT, sepsis, and AKI.  - echocardiogram is pending to assess LV function and wall motion.   3. HTN - BP has been variable at 81/55 - 135/72  in the past 24 hours. - Lopressor dosing as above. Hold for SBP < 100.  4. Urosepsis - WBC elevated to 30.6 on admission with Lactic Acid of 3.14. Started on IVF and broad-spectrum antibiotics. -  Not thought to be secondary to his  pancreatitis as Lipase is normal this admission. Likely due to Urosepsis with urine culture being positive for Enterococcus Faecalis.  - per admitting team  5. Pancreatitis - admitted from 03/23/2016 - 04/07/2016 for acute hemorrhagic pancreatitis thought to be secondary to gallstones. - surgery currently following and planning for cholecystectomy as inpatient vs. outpatient.   6. AKI - creatinine 2.03 on admission (was 0.69 two weeks prior). - improved to 0.76 today.  Signed, Erma Heritage, PA  04/25/2016, 12:16 PM   The patient has been seen in conjunction with Bernerd Pho, PA. All aspects of care have been considered and discussed. The patient has been personally interviewed, examined, and all clinical data has been reviewed.   Heart rates under much better control.  Echo is still pending.  Continue current regimen for cardiac issues.  I do not believe an ischemic evaluation is warranted under the circumstances, unless the echocardiogram dictates otherwise.

## 2016-04-26 ENCOUNTER — Inpatient Hospital Stay (HOSPITAL_COMMUNITY): Payer: Medicare Other

## 2016-04-26 DIAGNOSIS — I420 Dilated cardiomyopathy: Secondary | ICD-10-CM

## 2016-04-26 LAB — GLUCOSE, CAPILLARY
GLUCOSE-CAPILLARY: 147 mg/dL — AB (ref 65–99)
GLUCOSE-CAPILLARY: 114 mg/dL — AB (ref 65–99)
GLUCOSE-CAPILLARY: 136 mg/dL — AB (ref 65–99)
Glucose-Capillary: 136 mg/dL — ABNORMAL HIGH (ref 65–99)
Glucose-Capillary: 406 mg/dL — ABNORMAL HIGH (ref 65–99)
Glucose-Capillary: 154 mg/dL — ABNORMAL HIGH (ref 65–99)

## 2016-04-26 LAB — BASIC METABOLIC PANEL
Anion gap: 5 (ref 5–15)
BUN: 15 mg/dL (ref 6–20)
CALCIUM: 7.9 mg/dL — AB (ref 8.9–10.3)
CHLORIDE: 102 mmol/L (ref 101–111)
CO2: 24 mmol/L (ref 22–32)
CREATININE: 0.7 mg/dL (ref 0.61–1.24)
GFR calc Af Amer: >60 mL/min (ref >60)
GFR calc non Af Amer: >60 mL/min (ref >60)
Glucose, Bld: 141 mg/dL — ABNORMAL HIGH (ref 65–99)
Potassium: 3.4 mmol/L — ABNORMAL LOW (ref 3.5–5.1)
SODIUM: 131 mmol/L — AB (ref 135–145)

## 2016-04-26 LAB — MAGNESIUM: Magnesium: 1.6 mg/dL — ABNORMAL LOW (ref 1.7–2.4)

## 2016-04-26 MED ORDER — POTASSIUM CHLORIDE CRYS ER 20 MEQ PO TBCR
40.0000 meq | EXTENDED_RELEASE_TABLET | Freq: Two times a day (BID) | ORAL | Status: AC
  Administered 2016-04-26 (×2): 40 meq via ORAL
  Filled 2016-04-26 (×2): qty 2

## 2016-04-26 MED ORDER — PANTOPRAZOLE SODIUM 40 MG PO TBEC
40.0000 mg | DELAYED_RELEASE_TABLET | Freq: Two times a day (BID) | ORAL | Status: DC
  Administered 2016-04-26 – 2016-04-28 (×5): 40 mg via ORAL
  Filled 2016-04-26 (×5): qty 1

## 2016-04-26 MED ORDER — LISINOPRIL 2.5 MG PO TABS
2.5000 mg | ORAL_TABLET | Freq: Every day | ORAL | Status: DC
  Administered 2016-04-26 – 2016-04-28 (×3): 2.5 mg via ORAL
  Filled 2016-04-26 (×3): qty 1

## 2016-04-26 MED ORDER — METOPROLOL SUCCINATE ER 100 MG PO TB24
100.0000 mg | ORAL_TABLET | Freq: Every day | ORAL | Status: DC
  Administered 2016-04-26 – 2016-04-27 (×2): 100 mg via ORAL
  Filled 2016-04-26: qty 1
  Filled 2016-04-26: qty 4

## 2016-04-26 MED ORDER — GUAIFENESIN-DM 100-10 MG/5ML PO SYRP
5.0000 mL | ORAL_SOLUTION | ORAL | Status: DC | PRN
  Administered 2016-04-26: 5 mL via ORAL
  Filled 2016-04-26: qty 10

## 2016-04-26 NOTE — Progress Notes (Signed)
[facility] PROGRESS NOTE    Progress Note  Colin West  ZOX:[id number] DOB: 11/25/1933 DOA: 04/22/2016 PCP: Verl BangsADIONTCHENKO, ALEXEI, MD     Brief Narrative:   Colin West is an 82 y.o. male with history of HTN o/w healthy admitted here 2 weeks ago with acute pancreatitis and gallstones, felt to be gallstone pancreatitis.  He had some hemorrhagic changes by CT, recovered slowly, was Rx with Imipenem, NG tube feeds.  WBC high, eventually improved and pt was dc'd on 04/07/16.  Plan was to have GB removed at a later date.  Pt here now with gen'd weakness for 3-4 days.  Assessment/Plan:   Severe Sepsis due to E. Coli pyelonephritis: Urine culture grew out more than 100,000 colonies of enterococcus species pansensitive. His lipase was normal. Continue strict I's and O's, will change IV antibiotics to oral ampicillin.He is tolerating diet well. Blood cultures remain negative till date. Continue to monitor electrolytes replete his needed. Transfer to telemetry floor.Check a chest x-ray. Awaiting PT evaluation.  Acute kidney injury: In setting of severe sepsis likely prerenal, resolved with IV fluid hydration. No JVD physical exam lungs are clear. Follow strict I's and O's.  Essential hypertension Continue hold antihypertensive medication. Blood pressure is stable.  Cholelithiasis Surgery following an appreciate assistance.    Leukocytosis Likely due to sepsis  Narrow complex tachycardia: Likely due to Sepsis, cont oral metoprolol to 50 mg, continue Toprol IV when necessary for heart rate greater than 110. 2-D echo showed a significant drop in ejection fraction of 30-35% with a grade 1 diastolic heart failure appreciate cardiology's assistance awaiting cardiology recommendations.   DVT prophylaxis: lovenox Family Communication:none Disposition Plan/Barrier to D/C: Transfer to telemetry Code Status:     Code Status Orders        Start     Ordered   04/22/16 2306   Full code  Continuous     04/22/16 2305    Code Status History    Date Active Date Inactive Code Status Order ID Comments User Context   03/23/2016  2:48 PM 04/07/2016  5:03 PM DNR [id number]  Meredeth IdeGagan S Lama, MD Inpatient        IV Access:    Peripheral IV   Procedures and diagnostic studies:   Dg Chest Port 1 View  Result Date: 04/24/2016 CLINICAL DATA:  Central line placement EXAM: PORTABLE CHEST 1 VIEW COMPARISON:  04/24/2016 400 hours FINDINGS: Right upper extremity PICC placed. Tip is at the cavoatrial junction. Low volumes. Stable ovoid density towards the right apex. Normal heart size. No pneumothorax. IMPRESSION: Right upper extremity PICC placed. Tip is at the cavoatrial junction. Electronically Signed   By: Jolaine ClickArthur  Hoss M.D.   On: 04/24/2016 13:31     Medical Consultants:    None.  Anti-Infectives:   Vancomycin and Zosyn started on admission.  Subjective:    Colin West tolerating diet with some cough  Objective:    Vitals:   04/26/16 0400 04/26/16 0405 04/26/16 0500 04/26/16 0600  BP: (!) 147/77     Pulse: 95   94  Resp: (!) 22   (!) 22  Temp:  97.4 F (36.3 C)    TempSrc:  Oral    SpO2: 95%   95%  Weight:   75.4 kg (166 lb 3.6 oz)   Height:        Intake/Output Summary (Last 24 hours) at 04/26/16 0708 Last data filed at 04/26/16 0600  Gross per 24 hour  Intake  1920 ml  Output             1075 ml  Net              845 ml   Filed Weights   04/22/16 1547 04/24/16 0500 04/26/16 0500  Weight: 70.3 kg (155 lb) 73.7 kg (162 lb 7.7 oz) 75.4 kg (166 lb 3.6 oz)    Exam: General exam: In no acute distress. Respiratory system: Good air movement and clear to auscultation. Cardiovascular system: S1 & S2 heard, RRR.  Gastrointestinal system: Abdomen is nondistended, soft and nontender.  Central nervous system: Alert and oriented. No focal neurological deficits. Extremities: No pedal edema. Skin: No rashes, lesions or  ulcers Psychiatry: Judgement and insight appear normal. Mood & affect appropriate.    Data Reviewed:    Labs: Basic Metabolic Panel:  Recent Labs Lab 04/23/16 0507 04/23/16 1852 04/24/16 0305 04/25/16 0305 04/26/16 0401  NA 134* 133* 132* 132* 131*  K 3.6 3.6 3.1* 3.3* 3.4*  CL 106 104 104 104 102  CO2 20* 22 21* 24 24  GLUCOSE 125* 112* 99 135* 141*  BUN 35* 25* 24* 21* 15  CREATININE 1.27* 1.08 1.03 0.76 0.70  CALCIUM 8.7* 8.4* 8.2* 7.9* 7.9*  MG  --   --   --  1.6* 1.6*   GFR Estimated Creatinine Clearance: 73.6 mL/min (by C-G formula based on SCr of 0.7 mg/dL). Liver Function Tests:  Recent Labs Lab 04/22/16 1554 04/23/16 0507  AST 26 21  ALT 34 27  ALKPHOS 70 56  BILITOT 2.1* 1.7*  PROT 7.5 5.7*  ALBUMIN 3.6 2.7*    Recent Labs Lab 04/22/16 1554  LIPASE 22  AMYLASE 30   No results for input(s): AMMONIA in the last 168 hours. Coagulation profile  Recent Labs Lab 04/23/16 0507  INR 1.39    CBC:  Recent Labs Lab 04/22/16 1554 04/23/16 0507 04/25/16 0305  WBC 30.6* 22.7* 16.6*  NEUTROABS 27.3*  --   --   HGB 15.3 12.8* 11.0*  HCT 44.3 38.4* 32.0*  MCV 96.5 97.0 93.8  PLT 266 211 161   Cardiac Enzymes:  Recent Labs Lab 04/23/16 1852 04/24/16 0305  TROPONINI 0.17* 0.09*   BNP (last 3 results) No results for input(s): PROBNP in the last 8760 hours. CBG:  Recent Labs Lab 04/25/16 1606 04/25/16 1931 04/25/16 2310 04/26/16 0400 04/26/16 0403  GLUCAP 155* 167* 195* 406* 136*   D-Dimer: No results for input(s): DDIMER in the last 72 hours. Hgb A1c: No results for input(s): HGBA1C in the last 72 hours. Lipid Profile: No results for input(s): CHOL, HDL, LDLCALC, TRIG, CHOLHDL, LDLDIRECT in the last 72 hours. Thyroid function studies: No results for input(s): TSH, T4TOTAL, T3FREE, THYROIDAB in the last 72 hours.  Invalid input(s): FREET3 Anemia work up: No results for input(s): VITAMINB12, FOLATE, FERRITIN, TIBC, IRON,  RETICCTPCT in the last 72 hours. Sepsis Labs:  Recent Labs Lab 04/22/16 1554 04/22/16 1608 04/22/16 1815 04/23/16 0507 04/23/16 0541 04/25/16 0305  WBC 30.6*  --   --  22.7*  --  16.6*  LATICACIDVEN  --  3.14* 3.05*  --  1.2  --    Microbiology Recent Results (from the past 240 hour(s))  Culture, blood (Routine x 2)     Status: None (Preliminary result)   Collection Time: 04/22/16  3:54 PM  Result Value Ref Range Status   Specimen Description BLOOD RIGHT ANTECUBITAL  Final   Special Requests BOTTLES DRAWN AEROBIC AND  ANAEROBIC 5CC  Final   Culture   Final    NO GROWTH 3 DAYS Performed at [facility]    Report Status PENDING  Incomplete  Culture, blood (Routine x 2)     Status: None (Preliminary result)   Collection Time: 04/22/16  4:01 PM  Result Value Ref Range Status   Specimen Description BLOOD LEFT ANTECUBITAL  Final   Special Requests BOTTLES DRAWN AEROBIC AND ANAEROBIC 5CC  Final   Culture   Final    NO GROWTH 3 DAYS Performed at [facility]    Report Status PENDING  Incomplete  Urine culture     Status: Abnormal   Collection Time: 04/22/16  4:14 PM  Result Value Ref Range Status   Specimen Description URINE, CLEAN CATCH  Final   Special Requests NONE  Final   Culture >=100,000 COLONIES/mL ENTEROCOCCUS FAECALIS (A)  Final   Report Status 04/24/2016 FINAL  Final   Organism ID, Bacteria ENTEROCOCCUS FAECALIS (A)  Final      Susceptibility   Enterococcus faecalis - MIC*    AMPICILLIN <=2 SENSITIVE Sensitive     LEVOFLOXACIN 1 SENSITIVE Sensitive     NITROFURANTOIN <=16 SENSITIVE Sensitive     VANCOMYCIN 1 SENSITIVE Sensitive     * >=100,000 COLONIES/mL ENTEROCOCCUS FAECALIS  MRSA PCR Screening     Status: None   Collection Time: 04/23/16 10:42 AM  Result Value Ref Range Status   MRSA by PCR NEGATIVE NEGATIVE Final    Comment:        The GeneXpert MRSA Assay (FDA approved for NASAL specimens only), is one component of a comprehensive MRSA  colonization surveillance program. It is not intended to diagnose MRSA infection nor to guide or monitor treatment for MRSA infections.      Medications:   . amoxicillin-clavulanate  1 tablet Oral Q12H  . feeding supplement (GLUCERNA SHAKE)  237 mL Oral TID BM  . insulin aspart  0-9 Units Subcutaneous Q4H  . lipase/protease/amylase  36,000 Units Oral TID AC  . metoprolol tartrate  50 mg Oral BID  . sodium chloride flush  10-40 mL Intracatheter Q12H  . zolpidem  5 mg Oral QHS   Continuous Infusions:   Time spent: 25 min   LOS: 4 days   Marinda Elk  [facility] Pager [phone]  *Please refer to [email or web address], password [email or web address] to get updated schedule on who will round on this patient, as hospitalists switch teams weekly. If 7PM-7AM, please contact night-coverage at [email or web address], password [email or web address] for any overnight needs.  04/26/2016, 7:08 AM

## 2016-04-26 NOTE — Progress Notes (Signed)
Subjective: Complains of some mild abd discomfort with eating  Objective: Vital signs in last 24 hours: Temp:  [97.4 F (36.3 C)-98.9 F (37.2 C)] 97.4 F (36.3 C) (11/11 0405) Pulse Rate:  [84-137] 94 (11/11 0600) Resp:  [17-33] 22 (11/11 0600) BP: (117-176)/(61-85) 147/77 (11/11 0400) SpO2:  [93 %-98 %] 95 % (11/11 0600) Weight:  [75.4 kg (166 lb 3.6 oz)] 75.4 kg (166 lb 3.6 oz) (11/11 0500) Last BM Date: 04/25/16  Intake/Output from previous day: 11/10 0701 - 11/11 0700 In: 1920 [P.O.:120; I.V.:1800] Out: 1075 [Urine:1075] Intake/Output this shift: No intake/output data recorded.  Resp: clear to auscultation bilaterally Cardio: regular rate and rhythm and still tachy GI: soft, nontender  Lab Results:   Recent Labs  04/25/16 0305  WBC 16.6*  HGB 11.0*  HCT 32.0*  PLT 161   BMET  Recent Labs  04/25/16 0305 04/26/16 0401  NA 132* 131*  K 3.3* 3.4*  CL 104 102  CO2 24 24  GLUCOSE 135* 141*  BUN 21* 15  CREATININE 0.76 0.70  CALCIUM 7.9* 7.9*   PT/INR No results for input(s): LABPROT, INR in the last 72 hours. ABG No results for input(s): PHART, HCO3 in the last 72 hours.  Invalid input(s): PCO2, PO2  Studies/Results: Dg Chest Port 1 View  Result Date: 04/24/2016 CLINICAL DATA:  Central line placement EXAM: PORTABLE CHEST 1 VIEW COMPARISON:  04/24/2016 400 hours FINDINGS: Right upper extremity PICC placed. Tip is at the cavoatrial junction. Low volumes. Stable ovoid density towards the right apex. Normal heart size. No pneumothorax. IMPRESSION: Right upper extremity PICC placed. Tip is at the cavoatrial junction. Electronically Signed   By: Jolaine ClickArthur  Hoss M.D.   On: 04/24/2016 13:31    Anti-infectives: Anti-infectives    Start     Dose/Rate Route Frequency Ordered Stop   04/25/16 1000  amoxicillin-clavulanate (AUGMENTIN) 875-125 MG per tablet 1 tablet     1 tablet Oral Every 12 hours 04/25/16 0847     04/23/16 1600  vancomycin (VANCOCIN) IVPB 750  mg/150 ml premix  Status:  Discontinued     750 mg 150 mL/hr over 60 Minutes Intravenous Every 24 hours 04/22/16 1705 04/23/16 1049   04/23/16 1200  vancomycin (VANCOCIN) IVPB 750 mg/150 ml premix  Status:  Discontinued     750 mg 150 mL/hr over 60 Minutes Intravenous Every 12 hours 04/23/16 1049 04/25/16 0852   04/23/16 0600  vancomycin (VANCOCIN) IVPB 750 mg/150 ml premix  Status:  Discontinued     750 mg 150 mL/hr over 60 Minutes Intravenous Every 12 hours 04/22/16 1602 04/22/16 1705   04/22/16 2200  piperacillin-tazobactam (ZOSYN) IVPB 3.375 g  Status:  Discontinued     3.375 g 12.5 mL/hr over 240 Minutes Intravenous Every 8 hours 04/22/16 1602 04/25/16 0852   04/22/16 1530  piperacillin-tazobactam (ZOSYN) IVPB 3.375 g  Status:  Discontinued     3.375 g 100 mL/hr over 30 Minutes Intravenous  Once 04/22/16 1516 04/22/16 1522   04/22/16 1530  vancomycin (VANCOCIN) IVPB 1000 mg/200 mL premix  Status:  Discontinued     1,000 mg 200 mL/hr over 60 Minutes Intravenous  Once 04/22/16 1516 04/22/16 1522   04/22/16 1530  vancomycin (VANCOCIN) IVPB 1000 mg/200 mL premix     1,000 mg 200 mL/hr over 60 Minutes Intravenous STAT 04/22/16 1522 04/22/16 1803   04/22/16 1530  piperacillin-tazobactam (ZOSYN) IVPB 3.375 g     3.375 g 100 mL/hr over 30 Minutes Intravenous STAT 04/22/16 1522 04/22/16  1639      Assessment/Plan: s/p * No surgery found * Advance diet  Continue augmentin for UTI Echo complete. Troponin elevated. Wait for cardiology eval Pancreatitis stable  LOS: 4 days    TOTH III,Jasher Barkan S 04/26/2016

## 2016-04-26 NOTE — Progress Notes (Signed)
Patient Name: Colin West Date of Encounter: 04/26/2016  [facility]  Hospital Problem List     Active Problems:   Essential hypertension   Cholelithiasis   Sepsis (HCC)   Leukocytosis   Hypotension   Sinus tachycardia   History of acute pancreatitis   SOB (shortness of breath)   Elevated troponin    Subjective   Denies dyspnea or chest pain  Inpatient Medications    Scheduled Meds: . amoxicillin-clavulanate  1 tablet Oral Q12H  . feeding supplement (GLUCERNA SHAKE)  237 mL Oral TID BM  . insulin aspart  0-9 Units Subcutaneous Q4H  . lipase/protease/amylase  36,000 Units Oral TID AC  . metoprolol tartrate  50 mg Oral BID  . pantoprazole  40 mg Oral BID  . potassium chloride  40 mEq Oral BID  . sodium chloride flush  10-40 mL Intracatheter Q12H  . zolpidem  5 mg Oral QHS   Continuous Infusions:  PRN Meds: HYDROmorphone (DILAUDID) injection, metoCLOPramide, metoprolol, morphine injection, ondansetron **OR** ondansetron (ZOFRAN) IV, polyvinyl alcohol, sodium chloride flush   Vital Signs    Vitals:   04/26/16 0405 04/26/16 0500 04/26/16 0600 04/26/16 0800  BP:      Pulse:   94   Resp:   (!) 22   Temp: 97.4 F (36.3 C)   98.4 F (36.9 C)  TempSrc: Oral   Oral  SpO2:   95%   Weight:  166 lb 3.6 oz (75.4 kg)    Height:        Intake/Output Summary (Last 24 hours) at 04/26/16 0911 Last data filed at 04/26/16 0600  Gross per 24 hour  Intake             1695 ml  Output              875 ml  Net              820 ml   Filed Weights   04/22/16 1547 04/24/16 0500 04/26/16 0500  Weight: 155 lb (70.3 kg) 162 lb 7.7 oz (73.7 kg) 166 lb 3.6 oz (75.4 kg)    Physical Exam    GEN: Elderly Caucasian appearing in no acute distress.  HEENT: Grossly normal.  Neck: Supple Cardiac: RRR Respiratory:  CTA GI: Soft, nontender, nondistended MS: no deformity or atrophy. Skin: warm and dry, no rash. Neuro:  Strength and sensation are  intact. Psych: AAOx3.  Normal affect.  Labs    CBC  Recent Labs  04/25/16 0305  WBC 16.6*  HGB 11.0*  HCT 32.0*  MCV 93.8  PLT 161   Basic Metabolic Panel  Recent Labs  04/25/16 0305 04/26/16 0401  NA 132* 131*  K 3.3* 3.4*  CL 104 102  CO2 24 24  GLUCOSE 135* 141*  BUN 21* 15  CREATININE 0.76 0.70  CALCIUM 7.9* 7.9*  MG 1.6* 1.6*   Cardiac Enzymes  Recent Labs  04/23/16 1852 04/24/16 0305  TROPONINI 0.17* 0.09*     Telemetry    NSR, PVCs Personally Reviewed    Radiology    Dg Chest Port 1 View  Result Date: 04/24/2016 CLINICAL DATA:  Central line placement EXAM: PORTABLE CHEST 1 VIEW COMPARISON:  04/24/2016 400 hours FINDINGS: Right upper extremity PICC placed. Tip is at the cavoatrial junction. Low volumes. Stable ovoid density towards the right apex. Normal heart size. No pneumothorax. IMPRESSION: Right upper extremity PICC placed. Tip is at the cavoatrial junction. Electronically Signed  By: Jolaine ClickArthur  Hoss M.D.   On: 04/24/2016 13:31   Dg Chest Port 1 View  Result Date: 04/24/2016 CLINICAL DATA:  Short of breath EXAM: PORTABLE CHEST 1 VIEW COMPARISON:  04/22/2016 FINDINGS: Normal heart size. Ovoid density at the right apex is stable. Lungs are very under aerated but otherwise grossly clear. Chronic right rib deformities. No pneumothorax. IMPRESSION: Stable examination.  Stable ovoid density at the right apex. Electronically Signed   By: Jolaine ClickArthur  Hoss M.D.   On: 04/24/2016 07:56     Patient Profile     82 yo male w/ PMH of of recent acute pancreatitis, cholelithiasis, HTN, and Type 2 DM [facility] for generalized weakness. Diagnosed with Urosepsis. Cards consulted for tachycardia. Also with cardiomyopathy on echo.  Assessment & Plan    1. Narrow-Complex Tachycardia - currently admitted with Sepsis secondary to UTI.  - EKG on admission showed sinus tachycardia, HR 120, with occasional PVC's. Also with episodes of SVT; now  improved. Change lopressor to toprol.  2. Elevated Troponin - cyclic troponin values have been 0.09 and 0.17.  - he denies any recent episodes of chest discomfort or dyspnea with exertion. No prior cardiac history. - enzymes not consistent with ACS  3. HTN - BP elevated; add ACEI for hypertension and elevated BP  4. Urosepsis -Enterococcus Faecalis. Continue antibiotics  5. Pancreatitis - admitted from 03/23/2016 - 04/07/2016 for acute hemorrhagic pancreatitis thought to be secondary to gallstones. - surgery currently following and planning for cholecystectomy as inpatient vs. outpatient.   6. AKI - improved  7. Cardiomyopathy - Echo shows EF 30-35. Etiology of LV dysfunction unclear. Question septic cardiomyopathy or tachycardia mediated cardiomyopathy. He has not had chest pain and enzymes are not consistent with acute coronary syndrome. I would favor medical therapy until he recovers from his recent pancreatitis and urosepsis. Could then have nuclear study to exclude ischemia as an outpatient and also follow-up echocardiogram to see if LV function improves. If he requires cholecystectomy while in-house would need nuclear study preoperatively.  Signed, Olga MillersBrian Harriet Sutphen, MD  04/26/2016, 9:11 AM

## 2016-04-26 NOTE — Evaluation (Signed)
Physical Therapy Evaluation Patient Details Name: Colin West MRN: [id number] DOB: [birth year] Today's Date: 04/26/2016   History of Present Illness  The patient is an 82 yo wm who presents with weakness and sepsis syndrome. He was admitted about 3 weeks ago with hemorrhagic gallstone pancreatitis. Admitted 11/7 for nausea and loss of appetite. He denies abdominal pain. CT shows persistent inflammation around pancreas that is unchanged -   Clinical Impression  The patient is weaker than last admission per the PT notes. Family present and confirms that patient has 24/7 caregivers.Pt admitted with above diagnosis. Pt currently with functional limitations due to the deficits listed below (see PT Problem List).  Pt will benefit from skilled PT to increase their independence and safety with mobility to allow discharge to the venue listed below.       Follow Up Recommendations Home health PT;Supervision for mobility/OOB    Equipment Recommendations  None recommended by PT    Recommendations for Other Services       Precautions / Restrictions Precautions Precautions: Fall      Mobility  Bed Mobility Overal bed mobility: Needs Assistance Bed Mobility: Supine to Sit     Supine to sit: Mod assist     General bed mobility comments: assist with trunk, extra time   Transfers Overall transfer level: Needs assistance Equipment used: Rolling walker (2 wheeled) Transfers: Sit to/from Stand Sit to Stand: Min assist         General transfer comment: close guard for safety, extra time. Practiced x 2 then ambulated to recliner  Ambulation/Gait Ambulation/Gait assistance: Min assist Ambulation Distance (Feet): 20 Feet Assistive device: Rolling walker (2 wheeled) Gait Pattern/deviations: Shuffle     General Gait Details: close guard for safety.  extra time, moves slowly, steady assist   Stairs            Wheelchair Mobility    Modified Rankin (Stroke Patients Only)        Balance Overall balance assessment: Needs assistance Sitting-balance support: Feet supported;Bilateral upper extremity supported Sitting balance-Leahy Scale: Fair     Standing balance support: During functional activity;Bilateral upper extremity supported Standing balance-Leahy Scale: Poor                               Pertinent Vitals/Pain Pain Assessment: No/denies pain    Home Living Family/patient expects to be discharged to:: Private residence Living Arrangements: Alone Available Help at Discharge: Family Type of Home: House Home Access: Stairs to enter   Secretary/administratorntrance Stairs-Number of Steps: 4 Home Layout: One level Home Equipment: Environmental consultantWalker - 2 wheels      Prior Function           Comments: was needing some assistance after discharge 3 weeks ago     Hand Dominance        Extremity/Trunk Assessment   Upper Extremity Assessment: Generalized weakness           Lower Extremity Assessment: Generalized weakness      Cervical / Trunk Assessment: Kyphotic  Communication   Communication: No difficulties  Cognition Arousal/Alertness: Awake/alert Behavior During Therapy: WFL for tasks assessed/performed Overall Cognitive Status: Within Functional Limits for tasks assessed                      General Comments      Exercises     Assessment/Plan    PT Assessment Patient needs continued PT services  PT Problem List Decreased mobility;Decreased strength;Decreased balance;Decreased knowledge of use of DME          PT Treatment Interventions DME instruction;Gait training;Therapeutic activities;Therapeutic exercise;Patient/family education;Functional mobility training;Balance training    PT Goals (Current goals can be found in the Care Plan section)  Acute Rehab PT Goals Patient Stated Goal: none stated PT Goal Formulation: With patient/family Time For Goal Achievement: 05/10/16 Potential to Achieve Goals: Good    Frequency  Min 3X/week   Barriers to discharge        Co-evaluation               End of Session   Activity Tolerance: Patient tolerated treatment well Patient left: in chair;with call bell/phone within reach;with family/visitor present Nurse Communication: Mobility status         Time: [id number] PT Time Calculation (min) (ACUTE ONLY): 26 min   Charges:     PT Treatments $Gait Training: 8-22 mins   PT G Codes:        Rada HayHill, Aleah Ahlgrim Elizabeth 04/26/2016, 4:34 PM  Blanchard KelchKaren Doroteo Nickolson PT [phone]

## 2016-04-27 DIAGNOSIS — I471 Supraventricular tachycardia: Secondary | ICD-10-CM

## 2016-04-27 LAB — GLUCOSE, CAPILLARY
GLUCOSE-CAPILLARY: 152 mg/dL — AB (ref 65–99)
GLUCOSE-CAPILLARY: 208 mg/dL — AB (ref 65–99)
GLUCOSE-CAPILLARY: 234 mg/dL — AB (ref 65–99)
Glucose-Capillary: 121 mg/dL — ABNORMAL HIGH (ref 65–99)
Glucose-Capillary: 125 mg/dL — ABNORMAL HIGH (ref 65–99)
Glucose-Capillary: 129 mg/dL — ABNORMAL HIGH (ref 65–99)
Glucose-Capillary: 211 mg/dL — ABNORMAL HIGH (ref 65–99)

## 2016-04-27 LAB — BASIC METABOLIC PANEL
Anion gap: 3 — ABNORMAL LOW (ref 5–15)
BUN: 14 mg/dL (ref 6–20)
CALCIUM: 8.1 mg/dL — AB (ref 8.9–10.3)
CHLORIDE: 104 mmol/L (ref 101–111)
CO2: 25 mmol/L (ref 22–32)
CREATININE: 0.68 mg/dL (ref 0.61–1.24)
GFR calc Af Amer: >60 mL/min (ref >60)
GFR calc non Af Amer: >60 mL/min (ref >60)
Glucose, Bld: 130 mg/dL — ABNORMAL HIGH (ref 65–99)
Potassium: 4.2 mmol/L (ref 3.5–5.1)
Sodium: 132 mmol/L — ABNORMAL LOW (ref 135–145)

## 2016-04-27 LAB — CULTURE, BLOOD (ROUTINE X 2)
Culture: NO GROWTH
Culture: NO GROWTH

## 2016-04-27 LAB — MAGNESIUM: Magnesium: 1.8 mg/dL (ref 1.7–2.4)

## 2016-04-27 MED ORDER — HYDROCODONE-ACETAMINOPHEN 5-325 MG PO TABS
1.0000 | ORAL_TABLET | ORAL | Status: DC | PRN
  Administered 2016-04-28 (×2): 2 via ORAL
  Filled 2016-04-27 (×2): qty 1
  Filled 2016-04-27: qty 2

## 2016-04-27 MED ORDER — OXYCODONE-ACETAMINOPHEN 5-325 MG PO TABS
1.0000 | ORAL_TABLET | ORAL | Status: DC | PRN
  Administered 2016-04-28: 1 via ORAL
  Filled 2016-04-27: qty 1

## 2016-04-27 MED ORDER — METOPROLOL TARTRATE 50 MG PO TABS
100.0000 mg | ORAL_TABLET | Freq: Two times a day (BID) | ORAL | Status: DC
Start: 2016-04-27 — End: 2016-04-28
  Administered 2016-04-27 – 2016-04-28 (×3): 100 mg via ORAL
  Filled 2016-04-27 (×3): qty 2

## 2016-04-27 NOTE — Progress Notes (Signed)
Patient Name: Colin West N Schulenburg Date of Encounter: 04/27/2016  Primary Cardiologist: [facility]  Hospital Problem List     Active Problems:   Essential hypertension   Cholelithiasis   Sepsis (HCC)   Leukocytosis   Hypotension   Sinus tachycardia   History of acute pancreatitis   SOB (shortness of breath)   Elevated troponin   Congestive dilated cardiomyopathy (HCC)    Subjective   Denies dyspnea or chest pain, feels weak.  Inpatient Medications    Scheduled Meds: . amoxicillin-clavulanate  1 tablet Oral Q12H  . feeding supplement (GLUCERNA SHAKE)  237 mL Oral TID BM  . insulin aspart  0-9 Units Subcutaneous Q4H  . lipase/protease/amylase  36,000 Units Oral TID AC  . lisinopril  2.5 mg Oral Daily  . metoprolol succinate  100 mg Oral Daily  . pantoprazole  40 mg Oral BID  . sodium chloride flush  10-40 mL Intracatheter Q12H  . zolpidem  5 mg Oral QHS   Continuous Infusions:  PRN Meds: guaiFENesin-dextromethorphan, HYDROmorphone (DILAUDID) injection, metoCLOPramide, metoprolol, morphine injection, ondansetron **OR** ondansetron (ZOFRAN) IV, polyvinyl alcohol, sodium chloride flush   Vital Signs    Vitals:   04/26/16 0800 04/26/16 1121 04/26/16 2022 04/27/16 0423  BP:  138/80 (!) 141/87 134/70  Pulse:  99 (!) 105 (!) 103  Resp:  16 16 16   Temp: 98.4 F (36.9 C) 98.2 F (36.8 C) 98.5 F (36.9 C) 98.1 F (36.7 C)  TempSrc: Oral Oral Oral Oral  SpO2:  100% 96% 95%  Weight:  156 lb 12 oz (71.1 kg)  164 lb 14.5 oz (74.8 kg)  Height:  5\' 9"  (1.753 m)      Intake/Output Summary (Last 24 hours) at 04/27/16 1151 Last data filed at 04/27/16 0424  Gross per 24 hour  Intake                0 ml  Output              675 ml  Net             -675 ml   Filed Weights   04/26/16 0500 04/26/16 1121 04/27/16 0423  Weight: 166 lb 3.6 oz (75.4 kg) 156 lb 12 oz (71.1 kg) 164 lb 14.5 oz (74.8 kg)    Physical Exam    GEN: Elderly Caucasian appearing in no acute  distress.  HEENT: Grossly normal.  Neck: Supple Cardiac: RRR Respiratory:  CTA GI: Soft, nontender, nondistended MS: no deformity or atrophy. Skin: warm and dry, no rash. Neuro:  Strength and sensation are intact. Psych: AAOx3.  Normal affect.  Labs    CBC  Recent Labs  04/25/16 0305  WBC 16.6*  HGB 11.0*  HCT 32.0*  MCV 93.8  PLT 161   Basic Metabolic Panel  Recent Labs  04/26/16 0401 04/27/16 0417  NA 131* 132*  K 3.4* 4.2  CL 102 104  CO2 24 25  GLUCOSE 141* 130*  BUN 15 14  CREATININE 0.70 0.68  CALCIUM 7.9* 8.1*  MG 1.6* 1.8   Cardiac Enzymes No results for input(s): CKTOTAL, CKMB, CKMBINDEX, TROPONINI in the last 72 hours.   Telemetry    NSR, PVCs Personally Reviewed    Radiology    Dg Chest Port 1 View  Result Date: 04/24/2016 CLINICAL DATA:  Central line placement EXAM: PORTABLE CHEST 1 VIEW COMPARISON:  04/24/2016 400 hours FINDINGS: Right upper extremity PICC placed. Tip is at the cavoatrial junction. Low  volumes. Stable ovoid density towards the right apex. Normal heart size. No pneumothorax. IMPRESSION: Right upper extremity PICC placed. Tip is at the cavoatrial junction. Electronically Signed   By: Jolaine ClickArthur  Hoss M.D.   On: 04/24/2016 13:31   Dg Chest Port 1 View  Result Date: 04/24/2016 CLINICAL DATA:  Short of breath EXAM: PORTABLE CHEST 1 VIEW COMPARISON:  04/22/2016 FINDINGS: Normal heart size. Ovoid density at the right apex is stable. Lungs are very under aerated but otherwise grossly clear. Chronic right rib deformities. No pneumothorax. IMPRESSION: Stable examination.  Stable ovoid density at the right apex. Electronically Signed   By: Jolaine ClickArthur  Hoss M.D.   On: 04/24/2016 07:56     Patient Profile     82 yo male w/ PMH of of recent acute pancreatitis, cholelithiasis, HTN, and Type 2 DM who [facility] for generalized weakness. Diagnosed with Urosepsis. Cards consulted for tachycardia. Also with cardiomyopathy on  echo.  Assessment & Plan    1. Narrow-Complex Tachycardia - currently admitted with Sepsis secondary to UTI.  - EKG on admission showed sinus tachycardia, he continues to have frequent episodes of SVT with ventricular rate 140-160 BPM,  - I will switch toprol XL 100 to metoprolol tartrate 100 mg PO BID  2. Elevated Troponin - cyclic troponin values have been 0.09 and 0.17.  - he denies any recent episodes of chest discomfort or dyspnea with exertion. No prior cardiac history. - enzymes not consistent with ACS  3. HTN - BP elevated; I will switch toprol XL 100 to metoprolol tartrate 100 mg PO BID  4. Urosepsis -Enterococcus Faecalis. Continue antibiotics  5. Pancreatitis - admitted from 03/23/2016 - 04/07/2016 for acute hemorrhagic pancreatitis thought to be secondary to gallstones. - surgery currently following and planning for cholecystectomy as inpatient vs. outpatient.   6. AKI - improved  7. Cardiomyopathy - Echo shows EF 30-35. Etiology of LV dysfunction unclear. Question septic cardiomyopathy or tachycardia mediated cardiomyopathy. He has not had chest pain and enzymes are not consistent with acute coronary syndrome. I would favor medical therapy until he recovers from his recent pancreatitis and urosepsis. Could then have nuclear study to exclude ischemia as an outpatient and also follow-up echocardiogram to see if LV function improves. If he requires cholecystectomy while in-house would need nuclear study preoperatively.  Signed, Tobias AlexanderKatarina Jahnessa Vanduyn, MD  04/27/2016, 11:51 AM

## 2016-04-27 NOTE — Care Management Note (Signed)
Case Management Note  Patient Details  Name: Colin West MRN: [id number] Date of Birth: Mar 28, 1934  Subjective/Objective:   SIRS d/t acute gallstone pancreatitis, AKI, HTN                  Action/Plan: Discharge Planning: Chart reviewed. Family requesting SNF placement. CSW following for placement.    Expected Discharge Date:               Expected Discharge Plan:  Skilled Nursing Facility  In-House Referral:  Clinical Social Work  Discharge planning Services  CM Consult  Post Acute Care Choice:  NA Choice offered to:  NA  DME Arranged:  N/A DME Agency:  NA  HH Arranged:  NA HH Agency:  NA  Status of Service:  In process, will continue to follow  If discussed at Long Length of Stay Meetings, dates discussed:    Additional Comments:  Elliot CousinShavis, Cristina Ceniceros Ellen, RN 04/27/2016, 12:49 PM

## 2016-04-27 NOTE — Progress Notes (Signed)
Multiple family members at bedside requesting updates and with many questions.  Family members mention possible SNF placement at discharge.  Referring to most recent PT note which said that a family member present during PT treatment had stated that patient had 24/7 care at home.  Current family members in room state this is not the case.  CSW has been made aware that patient may possibly be needing SNF upon discharge; she will follow-up.

## 2016-04-27 NOTE — Clinical Social Work Note (Signed)
Clinical Social Work Assessment  Patient Details  Name: Colin West MRN: [id number] Date of Birth: [birth year]  Date of referral:  04/27/16               Reason for consult:  Facility Placement                Permission sought to share information with:  Facility Medical sales representativeContact Representative, Family Supports Permission granted to share information::  Yes, Designer, fashion/clothingVerbal Permission Granted (By son and daughter- pt is currently very groggy)    Housing/Transportation Living arrangements for the past 2 months:  Single Family Home Source of Information:  Adult Children Patient Interpreter Needed:  None Criminal Activity/Legal Involvement Pertinent to Current Situation/Hospitalization:  No - Comment as needed Significant Relationships:  Adult Children, Other(Comment) (Ex wife, extended family) Lives with:  Self Do you feel safe going back to the place where you live?  Yes (after rehab) Need for family participation in patient care:  Yes (Comment) (Pt is normally A & O. Currently groggy from medication)  Care giving concerns:  Lives alone; family "in and out" during the day but cannot stay 24 hours a day.  Pt was driving, working 40 hrs a week, going to football games etc. Currently can only walk about 20 ft and requires assistance to get out of bed. MD note states "possible d/c tomorrow".   Social Worker assessment / plan:   CSW approached by patient's daughter to request SNF placement for her father.  Review of records indicated that PT recommends home with home health- however- it also indicated that he had 24/7 care. Daughter was upset that her father's health has deteriorated in the past 3 weeks and he was in the hospital at that time for similar issues.  He currently is sleeping heavily and will not awaken very much per daughter due to being given dilaudid (which has been d/c'd per nursing). Family state that they watched PT get patient up and he could sit on the edge of the bed but had to be assisted  to stand and get to the chair where he feel right back asleep. They adamantly do not feel he is safe to go home nor can they be with him 24 hours a day.  [facility] and family is requesting placement there. This SW counseled family that based on current PT recommendation, patient may not meet criteria for placement.  Patient's ex-wife contacted Donne HazelSharon Lowa, [facility] who called stating that based on current medical issues, it is felt that patient could received Medicare covered services at least for a short while, then [facility].  Family was pleased with this. Mrs. Lowa stated that she does not anticipate any openings [facility] on Monday but  Recommended that family consider [facility] (Son [name] who is primary contact lives [place]  Discussed with son who was agreeable to this referral as well.  Fl2 sent to both facilities for review.  Pt will hopefully be more alert tomorrow.    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  Patient/Family's Understanding of and Emotional Response to Diagnosis, Current Treatment, and Prognosis:  Patient is currently sedated; family is very involved in his care and state that he has been placed in the past for SNF and thus are aware of the process. They were noted to be calm, cooperative and seeking information.  Emotional  Assessment Appearance:  Appears stated age Attitude/Demeanor/Rapport:  Sedated (Sleeping heavily; normally oriented. Dilaudid stopped per nursing) Affect (typically observed):  Unable to Assess (currently sedated) Orientation:  Oriented to Self, Oriented to Place, Oriented to  Time, Oriented to Situation Alcohol / Substance use:   (smoked 42 years ago. none since) Psych involvement (Current and /or in the community):  No (Comment)  Discharge Needs  Concerns to be addressed:  Care Coordination Readmission within the last 30 days:  No Current discharge risk:  Other (lives alone and health deterorated in past 3 weeks) Barriers to Discharge:  Continued Medical Work up   Lovette ClicheCrowder, Cypress Fanfan T, Alexander MtLCSW 04/27/2016, 3:20 PM

## 2016-04-27 NOTE — NC FL2 (Signed)
[place] MEDICAID FL2 LEVEL OF CARE SCREENING TOOL     IDENTIFICATION  Patient Name: Colin West Birthdate: [birth year] Sex: male Admission Date (Current Location): 04/22/2016  [facility] [place]   Facility and Address:  [facility],  [address], [place] [id number]      Provider Number: [id number]  Attending Physician Name and Address:  Marinda ElkAbraham Feliz Ortiz, MD  Relative Name and Phone Number:       Current Level of Care: Hospital Recommended Level of Care: Skilled Nursing Facility Prior Approval Number:    Date Approved/Denied:   PASRR Number:    Discharge Plan: SNF    Current Diagnoses: Patient Active Problem List   Diagnosis Date Noted  . SVT (supraventricular tachycardia) (HCC)   . Congestive dilated cardiomyopathy (HCC)   . SOB (shortness of breath)   . Elevated troponin   . Sepsis (HCC) 04/22/2016  . Leukocytosis 04/22/2016  . Hypotension 04/22/2016  . Sinus tachycardia 04/22/2016  . History of acute pancreatitis 04/22/2016  . SIRS (systemic inflammatory response syndrome) (HCC) 04/22/2016  . Cholelithiasis   . Malnutrition of moderate degree 03/28/2016  . Hyperbilirubinemia 03/26/2016  . Abdominal pain 03/23/2016  . Acute pancreatitis 03/23/2016  . Femoral hernia - left - s/p lap repair 06/01/2013 06/01/2013  . Bilateral inguinal hernia (BIH) s/p lap repair 06/01/2013 05/30/2013  . Occlusion and stenosis of carotid artery without mention of cerebral infarction 04/01/2012  . Hypertrophy of prostate with urinary obstruction and other lower urinary tract symptoms (LUTS) 05/30/2010  . CRYPTOCOCCOSIS 03/27/2010  . Essential hypertension 03/27/2010  . HYDROCELE 03/27/2010    Orientation RESPIRATION BLADDER Height & Weight     Self, Time, Situation, Place (currently groggy from dilaudid)  Normal Continent Weight: 164 lb 14.5 oz (74.8 kg) Height:  5\' 9"  (175.3 cm)  BEHAVIORAL SYMPTOMS/MOOD NEUROLOGICAL BOWEL  NUTRITION STATUS      Continent Diet (Regular)  AMBULATORY STATUS COMMUNICATION OF NEEDS Skin Contact dermatits- bil. groin   Limited Assist Verbally Normal                       Personal Care Assistance Level of Assistance  Bathing, Dressing Bathing Assistance: Limited assistance   Dressing Assistance: Limited assistance     Functional Limitations Info   (reading glasses)          SPECIAL CARE FACTORS FREQUENCY  PT (By licensed PT), OT (By licensed OT)   SS Insulin (currently) but no on it at home.     PT Frequency: weak- walking 4520ft with RW OT Frequency: Eval and treat            Contractures Contractures Info: Not present    Additional Factors Info  Code Status, Allergies Code Status Info: Full Allergies Info: No known allergies           Current Medications (04/27/2016):  This is the current hospital active medication list Current Facility-Administered Medications  Medication Dose Route Frequency Provider Last Rate Last Dose  . amoxicillin-clavulanate (AUGMENTIN) 875-125 MG per tablet 1 tablet  1 tablet Oral Q12H Marinda ElkAbraham Feliz Ortiz, MD   1 tablet at 04/27/16 0855  . feeding supplement (GLUCERNA SHAKE) (GLUCERNA SHAKE) liquid 237 mL  237 mL Oral TID BM Marinda ElkAbraham Feliz Ortiz, MD   237 mL at 04/27/16 1000  . guaiFENesin-dextromethorphan (ROBITUSSIN DM) 100-10 MG/5ML syrup 5 mL  5 mL Oral Q4H PRN Marinda ElkAbraham Feliz Ortiz, MD   5 mL at 04/26/16 2300  .  HYDROcodone-acetaminophen (NORCO/VICODIN) 5-325 MG per tablet 1-2 tablet  1-2 tablet Oral Q4H PRN Marinda ElkAbraham Feliz Ortiz, MD      . insulin aspart (novoLOG) injection 0-9 Units  0-9 Units Subcutaneous Q4H Delano Metzobert Schertz, MD   3 Units at 04/27/16 1237  . lipase/protease/amylase (CREON) capsule 36,000 Units  36,000 Units Oral TID AC Marinda ElkAbraham Feliz Ortiz, MD   36,000 Units at 04/27/16 1237  . lisinopril (PRINIVIL,ZESTRIL) tablet 2.5 mg  2.5 mg Oral Daily Lewayne BuntingBrian S Crenshaw, MD   2.5 mg at 04/27/16 0854  . metoCLOPramide  (REGLAN) tablet 5-10 mg  5-10 mg Oral Q8H PRN Marinda ElkAbraham Feliz Ortiz, MD   10 mg at 04/27/16 0854  . metoprolol (LOPRESSOR) injection 5 mg  5 mg Intravenous Q6H PRN Marinda ElkAbraham Feliz Ortiz, MD   5 mg at 04/26/16 2300  . metoprolol (LOPRESSOR) tablet 100 mg  100 mg Oral BID Lars MassonKatarina H Nelson, MD   100 mg at 04/27/16 1245  . morphine 2 MG/ML injection 2 mg  2 mg Intravenous Q4H PRN Marinda ElkAbraham Feliz Ortiz, MD   2 mg at 04/26/16 1804  . ondansetron (ZOFRAN) tablet 4 mg  4 mg Oral Q6H PRN Delano Metzobert Schertz, MD       Or  . ondansetron [facility](ZOFRAN) injection 4 mg  4 mg Intravenous Q6H PRN Delano Metzobert Schertz, MD   4 mg at 04/23/16 0256  . pantoprazole (PROTONIX) EC tablet 40 mg  40 mg Oral BID Marinda ElkAbraham Feliz Ortiz, MD   40 mg at 04/27/16 0854  . polyvinyl alcohol (LIQUIFILM TEARS) 1.4 % ophthalmic solution 1 drop  1 drop Both Eyes PRN Marinda ElkAbraham Feliz Ortiz, MD   1 drop at 04/25/16 2245  . sodium chloride flush (NS) 0.9 % injection 10-40 mL  10-40 mL Intracatheter Q12H Marinda ElkAbraham Feliz Ortiz, MD   20 mL at 04/26/16 2153  . sodium chloride flush (NS) 0.9 % injection 10-40 mL  10-40 mL Intracatheter PRN Marinda ElkAbraham Feliz Ortiz, MD   20 mL at 04/27/16 0417  . zolpidem (AMBIEN) tablet 5 mg  5 mg Oral QHS Ellsworth LennoxBrittany M Strader, [place]   5 mg at 04/26/16 2149     Discharge Medications: Please see discharge summary for a list of discharge medications.  Relevant Imaging Results:  Relevant Lab Results:   Additional Information SSN: [id number]  Pt very active normally- still works full time   Lovette ClicheCrowder, [name], [address]CSW

## 2016-04-27 NOTE — Progress Notes (Signed)
[facility] PROGRESS NOTE    Progress Note  Colin HasGraham N Tullis  UJW:[id number] DOB: Apr 29, 1934 DOA: 04/22/2016 PCP: Verl BangsADIONTCHENKO, ALEXEI, MD     Brief Narrative:   Colin West is an 82 y.o. male with history of HTN o/w healthy admitted here 2 weeks ago with acute pancreatitis and gallstones, felt to be gallstone pancreatitis.  He had some hemorrhagic changes by CT, recovered slowly, was Rx with Imipenem, NG tube feeds.  WBC high, eventually improved and pt was dc'd on 04/07/16.  Plan was to have GB removed at a later date.  Pt here now with gen'd weakness for 3-4 days.Severe sepsis with elevated blood gases leukocytosis and urine culture growing 100k colonies, his lipase was normal. He was treated empirically with IV vancomycin and cefepime. Once his urine cultures came back daily escalated to oral ampicillin. Patient was having since admission sinus tachycardia with a mild elevation in his cardiac troponins with hard to control heart rate cardiology was consulted a 2-D echo was done that showed a EF of 50%. Cardiology recommended conservative management and repeat 2-D echo and a stress test as an outpatient in 3-4 weeks. Surgery was consulted and appreciate assistance and would like to have him evaluated from a cardiac standpoint and recommended surgical intervention to remove the gallbladder in 4-6 weeks as an outpatient.  Assessment/Plan:   Severe Sepsis due to E. Coli pyelonephritis: Urine culture grew out more than 100,000 colonies of enterococcus species pansensitive.  Continue oral ampicillin. He is tolerating diet well No abdominal pain. Blood cultures remain negative till date. Continue telemetry mildly tachycardic. PT recommended home health PT.  Acute kidney injury: In setting of severe sepsis likely prerenal, resolved with IV fluid hydration. No JVD physical exam lungs are clear. Follow strict I's and O's.  Essential hypertension Pressure at goal Continue hold  antihypertensive medication. Blood pressure is stable.  Cholelithiasis Surgery following an appreciate assistance. Surgery to follow-up as an outpatient for possible cholecystectomy as an outpatient.    Leukocytosis Likely due to sepsis, now resolved.  Narrow complex tachycardia: Likely due to Sepsis, cont oral metoprolol to 50 mg, continue Toprol IV when necessary for heart rate greater than 110. 2-D echo showed a significant drop in ejection fraction of 30-35% with a grade 1 diastolic heart failure appreciate cardiology's assistance. Cardiology question if this is septic cardiomyopathy versus tachycardia mediated cardiomyopathy. Would favor medical therapy until he recovers from his pancreatitis and sepsis syndrome, with a nuclear stress test as an outpatient and 2-D echo.   DVT prophylaxis: lovenox Family Communication:none Disposition Plan/Barrier to D/C: Hopefully in the morning. Code Status:     Code Status Orders        Start     Ordered   04/22/16 2306  Full code  Continuous     04/22/16 2305    Code Status History    Date Active Date Inactive Code Status Order ID Comments User Context   03/23/2016  2:48 PM 04/07/2016  5:03 PM DNR [id number]  Meredeth IdeGagan S Lama, MD Inpatient        IV Access:    Peripheral IV   Procedures and diagnostic studies:   Dg Chest 1 View  Result Date: 04/26/2016 CLINICAL DATA:  Productive cough, diabetes, hypertension EXAM: CHEST 1 VIEW COMPARISON:  He 04/24/2016 FINDINGS: Right PICC line tip remains at the SVC RA junction level. Low lung volumes persist with slight interstitial prominence as before. Postop changes in the right lung. No developing focal pneumonia, collapse  or consolidation. Negative for effusion or pneumothorax. Trachea is midline. IMPRESSION: Stable low lung volumes and slight interstitial prominence, nonspecific. Stable postop changes in the right lung No significant interval change. Electronically Signed   By: Judie Petit.  Shick  M.D.   On: 04/26/2016 09:16     Medical Consultants:    None.  Anti-Infectives:   Vancomycin and Zosyn started on admission.  Subjective:    Colin West tolerating diet,Abdominal pain with eating.  Objective:    Vitals:   04/26/16 0800 04/26/16 1121 04/26/16 2022 04/27/16 0423  BP:  138/80 (!) 141/87 134/70  Pulse:  99 (!) 105 (!) 103  Resp:  16 16 16   Temp: 98.4 F (36.9 C) 98.2 F (36.8 C) 98.5 F (36.9 C) 98.1 F (36.7 C)  TempSrc: Oral Oral Oral Oral  SpO2:  100% 96% 95%  Weight:  71.1 kg (156 lb 12 oz)  74.8 kg (164 lb 14.5 oz)  Height:  5\' 9"  (1.753 m)      Intake/Output Summary (Last 24 hours) at 04/27/16 0810 Last data filed at 04/27/16 0424  Gross per 24 hour  Intake                0 ml  Output              875 ml  Net             -875 ml   Filed Weights   04/26/16 0500 04/26/16 1121 04/27/16 0423  Weight: 75.4 kg (166 lb 3.6 oz) 71.1 kg (156 lb 12 oz) 74.8 kg (164 lb 14.5 oz)    Exam: General exam: In no acute distress. Respiratory system: Good air movement and clear to auscultation. Cardiovascular system: S1 & S2 heard, RRR.  Gastrointestinal system: Abdomen is nondistended, soft and nontender.  Extremities: No pedal edema. Skin: No rashes, lesions or ulcers  Data Reviewed:    Labs: Basic Metabolic Panel:  Recent Labs Lab 04/23/16 1852 04/24/16 0305 04/25/16 0305 04/26/16 0401 04/27/16 0417  NA 133* 132* 132* 131* 132*  K 3.6 3.1* 3.3* 3.4* 4.2  CL 104 104 104 102 104  CO2 22 21* 24 24 25   GLUCOSE 112* 99 135* 141* 130*  BUN 25* 24* 21* 15 14  CREATININE 1.08 1.03 0.76 0.70 0.68  CALCIUM 8.4* 8.2* 7.9* 7.9* 8.1*  MG  --   --  1.6* 1.6* 1.8   GFR Estimated Creatinine Clearance: 73.6 mL/min (by C-G formula based on SCr of 0.68 mg/dL). Liver Function Tests:  Recent Labs Lab 04/22/16 1554 04/23/16 0507  AST 26 21  ALT 34 27  ALKPHOS 70 56  BILITOT 2.1* 1.7*  PROT 7.5 5.7*  ALBUMIN 3.6 2.7*    Recent Labs Lab  04/22/16 1554  LIPASE 22  AMYLASE 30   No results for input(s): AMMONIA in the last 168 hours. Coagulation profile  Recent Labs Lab 04/23/16 0507  INR 1.39    CBC:  Recent Labs Lab 04/22/16 1554 04/23/16 0507 04/25/16 0305  WBC 30.6* 22.7* 16.6*  NEUTROABS 27.3*  --   --   HGB 15.3 12.8* 11.0*  HCT 44.3 38.4* 32.0*  MCV 96.5 97.0 93.8  PLT 266 211 161   Cardiac Enzymes:  Recent Labs Lab 04/23/16 1852 04/24/16 0305  TROPONINI 0.17* 0.09*   BNP (last 3 results) No results for input(s): PROBNP in the last 8760 hours. CBG:  Recent Labs Lab 04/26/16 1658 04/26/16 2020 04/27/16 0037 04/27/16 1610 04/27/16 9604  GLUCAP 114* 136* 129* 121* 125*   D-Dimer: No results for input(s): DDIMER in the last 72 hours. Hgb A1c: No results for input(s): HGBA1C in the last 72 hours. Lipid Profile: No results for input(s): CHOL, HDL, LDLCALC, TRIG, CHOLHDL, LDLDIRECT in the last 72 hours. Thyroid function studies: No results for input(s): TSH, T4TOTAL, T3FREE, THYROIDAB in the last 72 hours.  Invalid input(s): FREET3 Anemia work up: No results for input(s): VITAMINB12, FOLATE, FERRITIN, TIBC, IRON, RETICCTPCT in the last 72 hours. Sepsis Labs:  Recent Labs Lab 04/22/16 1554 04/22/16 1608 04/22/16 1815 04/23/16 0507 04/23/16 0541 04/25/16 0305  WBC 30.6*  --   --  22.7*  --  16.6*  LATICACIDVEN  --  3.14* 3.05*  --  1.2  --    Microbiology Recent Results (from the past 240 hour(s))  Culture, blood (Routine x 2)     Status: None (Preliminary result)   Collection Time: 04/22/16  3:54 PM  Result Value Ref Range Status   Specimen Description BLOOD RIGHT ANTECUBITAL  Final   Special Requests BOTTLES DRAWN AEROBIC AND ANAEROBIC 5CC  Final   Culture   Final    NO GROWTH 4 DAYS Per[facility]    Report Status PENDING  Incomplete  Culture, blood (Routine x 2)     Status: None (Preliminary result)   Collection Time: 04/22/16  4:01 PM  Result  Value Ref Range Status   Specimen Description BLOOD LEFT ANTECUBITAL  Final   Special Requests BOTTLES DRAWN AEROBIC AND ANAEROBIC 5CC  Final   Culture   Final    NO GROWTH 4 DAYS Performed at [facility]    Report Status PENDING  Incomplete  Urine culture     Status: Abnormal   Collection Time: 04/22/16  4:14 PM  Result Value Ref Range Status   Specimen Description URINE, CLEAN CATCH  Final   Special Requests NONE  Final   Culture >=100,000 COLONIES/mL ENTEROCOCCUS FAECALIS (A)  Final   Report Status 04/24/2016 FINAL  Final   Organism ID, Bacteria ENTEROCOCCUS FAECALIS (A)  Final      Susceptibility   Enterococcus faecalis - MIC*    AMPICILLIN <=2 SENSITIVE Sensitive     LEVOFLOXACIN 1 SENSITIVE Sensitive     NITROFURANTOIN <=16 SENSITIVE Sensitive     VANCOMYCIN 1 SENSITIVE Sensitive     * >=100,000 COLONIES/mL ENTEROCOCCUS FAECALIS  MRSA PCR Screening     Status: None   Collection Time: 04/23/16 10:42 AM  Result Value Ref Range Status   MRSA by PCR NEGATIVE NEGATIVE Final    Comment:        The GeneXpert MRSA Assay (FDA approved for NASAL specimens only), is one component of a comprehensive MRSA colonization surveillance program. It is not intended to diagnose MRSA infection nor to guide or monitor treatment for MRSA infections.      Medications:   . amoxicillin-clavulanate  1 tablet Oral Q12H  . feeding supplement (GLUCERNA SHAKE)  237 mL Oral TID BM  . insulin aspart  0-9 Units Subcutaneous Q4H  . lipase/protease/amylase  36,000 Units Oral TID AC  . lisinopril  2.5 mg Oral Daily  . metoprolol succinate  100 mg Oral Daily  . pantoprazole  40 mg Oral BID  . sodium chloride flush  10-40 mL Intracatheter Q12H  . zolpidem  5 mg Oral QHS   Continuous Infusions:   Time spent: 15 min   LOS: 5 days   FELIZ ORTIZ, Darin EngelsABRAHAM  [facility] Pager [phone]  *Please refer to [email or web address], password [email or web address] to get updated schedule on who will round on this  patient, as hospitalists switch teams weekly. If 7PM-7AM, please contact night-coverage at [email or web address], password [email or web address] for any overnight needs.  04/27/2016, 8:10 AM

## 2016-04-27 NOTE — Clinical Social Work Placement (Signed)
   CLINICAL SOCIAL WORK PLACEMENT  NOTE  Date:  04/27/2016  Patient Details  Name: [name] MRN: [id number] Date of Birth: 1934/03/15  Clinical Social Work is seeking post-discharge placement for this patient at the Skilled  Nursing Facility level of care (*CSW will initial, date and re-position this form in  chart as items are completed):  Yes   Patient/family provided with [facility] Clinical Social Work Department's list of facilities offering this level of care within the geographic area requested by the patient (or if unable, by the patient's family).  Yes   Patient/family informed of their freedom to choose among providers that offer the needed level of care, that participate in Medicare, Medicaid or managed care program needed by the patient, have an available bed and are willing to accept the patient.  Yes   Patient/family informed of [facility]'s ownership interest in [facility] and [facility], as well as of the fact that they are under no obligation to receive care at these facilities.  PASRR submitted to EDS on       PASRR number received on       Existing PASRR number confirmed on  (Has existing number but unable to pull up 04/27/16)     FL2 transmitted to all facilities in geographic area requested by pt/family on 04/27/16     FL2 transmitted to all facilities within larger geographic area on       Patient informed that his/her managed care company has contracts with or will negotiate with certain facilities, including the following:            Patient/family informed of bed offers received.  Patient chooses bed at       Physician recommends and patient chooses bed at      Patient to be transferred to   on  .  Patient to be transferred to facility by Ambulance Sharin Mons([facility])     Patient family notified on   of transfer.  Name of family member notified:  SSN [phone]   STR   Very active prior to getting sick 3 weeks ago     PHYSICIAN Please  prepare priority discharge summary, including medications, Please prepare prescriptions, Please sign FL2     Additional Comment:    _______________________________________________ Darylene Pricerowder, Mizani Dilday T, LCSW 04/27/2016, 2:04 PM

## 2016-04-27 NOTE — Progress Notes (Signed)
Subjective: Resting comfortably. No complaints  Objective: Vital signs in last 24 hours: Temp:  [98.1 F (36.7 C)-98.5 F (36.9 C)] 98.1 F (36.7 C) (11/12 0423) Pulse Rate:  [99-105] 103 (11/12 0423) Resp:  [16] 16 (11/12 0423) BP: (134-141)/(70-87) 134/70 (11/12 0423) SpO2:  [95 %-100 %] 95 % (11/12 0423) Weight:  [71.1 kg (156 lb 12 oz)-74.8 kg (164 lb 14.5 oz)] 74.8 kg (164 lb 14.5 oz) (11/12 0423) Last BM Date: 04/25/16  Intake/Output from previous day: 11/11 0701 - 11/12 0700 In: -  Out: 875 [Urine:875] Intake/Output this shift: No intake/output data recorded.  Resp: clear to auscultation bilaterally Cardio: regular rate and rhythm and tachy GI: soft, minimal tenderness  Lab Results:   Recent Labs  04/25/16 0305  WBC 16.6*  HGB 11.0*  HCT 32.0*  PLT 161   BMET  Recent Labs  04/26/16 0401 04/27/16 0417  NA 131* 132*  K 3.4* 4.2  CL 102 104  CO2 24 25  GLUCOSE 141* 130*  BUN 15 14  CREATININE 0.70 0.68  CALCIUM 7.9* 8.1*   PT/INR No results for input(s): LABPROT, INR in the last 72 hours. ABG No results for input(s): PHART, HCO3 in the last 72 hours.  Invalid input(s): PCO2, PO2  Studies/Results: Dg Chest 1 View  Result Date: 04/26/2016 CLINICAL DATA:  Productive cough, diabetes, hypertension EXAM: CHEST 1 VIEW COMPARISON:  He 04/24/2016 FINDINGS: Right PICC line tip remains at the SVC RA junction level. Low lung volumes persist with slight interstitial prominence as before. Postop changes in the right lung. No developing focal pneumonia, collapse or consolidation. Negative for effusion or pneumothorax. Trachea is midline. IMPRESSION: Stable low lung volumes and slight interstitial prominence, nonspecific. Stable postop changes in the right lung No significant interval change. Electronically Signed   By: Judie PetitM.  Shick M.D.   On: 04/26/2016 09:16    Anti-infectives: Anti-infectives    Start     Dose/Rate Route Frequency Ordered Stop   04/25/16  1000  amoxicillin-clavulanate (AUGMENTIN) 875-125 MG per tablet 1 tablet     1 tablet Oral Every 12 hours 04/25/16 0847     04/23/16 1600  vancomycin (VANCOCIN) IVPB 750 mg/150 ml premix  Status:  Discontinued     750 mg 150 mL/hr over 60 Minutes Intravenous Every 24 hours 04/22/16 1705 04/23/16 1049   04/23/16 1200  vancomycin (VANCOCIN) IVPB 750 mg/150 ml premix  Status:  Discontinued     750 mg 150 mL/hr over 60 Minutes Intravenous Every 12 hours 04/23/16 1049 04/25/16 0852   04/23/16 0600  vancomycin (VANCOCIN) IVPB 750 mg/150 ml premix  Status:  Discontinued     750 mg 150 mL/hr over 60 Minutes Intravenous Every 12 hours 04/22/16 1602 04/22/16 1705   04/22/16 2200  piperacillin-tazobactam (ZOSYN) IVPB 3.375 g  Status:  Discontinued     3.375 g 12.5 mL/hr over 240 Minutes Intravenous Every 8 hours 04/22/16 1602 04/25/16 0852   04/22/16 1530  piperacillin-tazobactam (ZOSYN) IVPB 3.375 g  Status:  Discontinued     3.375 g 100 mL/hr over 30 Minutes Intravenous  Once 04/22/16 1516 04/22/16 1522   04/22/16 1530  vancomycin (VANCOCIN) IVPB 1000 mg/200 mL premix  Status:  Discontinued     1,000 mg 200 mL/hr over 60 Minutes Intravenous  Once 04/22/16 1516 04/22/16 1522   04/22/16 1530  vancomycin (VANCOCIN) IVPB 1000 mg/200 mL premix     1,000 mg 200 mL/hr over 60 Minutes Intravenous STAT 04/22/16 1522 04/22/16 1803  04/22/16 1530  piperacillin-tazobactam (ZOSYN) IVPB 3.375 g     3.375 g 100 mL/hr over 30 Minutes Intravenous STAT 04/22/16 1522 04/22/16 1639      Assessment/Plan: s/p * No surgery found * Advance diet  Gallstone pancreatitis stable Augmentin for UTI Cards recommending nuclear study, likely as outpt Will follow  LOS: 5 days    TOTH III,Cheyan Frees S 04/27/2016

## 2016-04-28 LAB — BASIC METABOLIC PANEL
ANION GAP: 6 (ref 5–15)
BUN: 19 mg/dL (ref 6–20)
CHLORIDE: 100 mmol/L — AB (ref 101–111)
CO2: 25 mmol/L (ref 22–32)
Calcium: 8.4 mg/dL — ABNORMAL LOW (ref 8.9–10.3)
Creatinine, Ser: 0.67 mg/dL (ref 0.61–1.24)
GFR calc non Af Amer: >60 mL/min (ref >60)
Glucose, Bld: 133 mg/dL — ABNORMAL HIGH (ref 65–99)
POTASSIUM: 4.1 mmol/L (ref 3.5–5.1)
SODIUM: 131 mmol/L — AB (ref 135–145)

## 2016-04-28 LAB — GLUCOSE, CAPILLARY
GLUCOSE-CAPILLARY: 123 mg/dL — AB (ref 65–99)
GLUCOSE-CAPILLARY: 165 mg/dL — AB (ref 65–99)
Glucose-Capillary: 128 mg/dL — ABNORMAL HIGH (ref 65–99)
Glucose-Capillary: 137 mg/dL — ABNORMAL HIGH (ref 65–99)

## 2016-04-28 LAB — MAGNESIUM: Magnesium: 1.8 mg/dL (ref 1.7–2.4)

## 2016-04-28 MED ORDER — LISINOPRIL 2.5 MG PO TABS: 2.5000 mg | ORAL_TABLET | Freq: Every day | ORAL | 2 refills | Status: DC

## 2016-04-28 MED ORDER — METOPROLOL TARTRATE 100 MG PO TABS: 100.0000 mg | ORAL_TABLET | Freq: Two times a day (BID) | ORAL | 3 refills | Status: DC

## 2016-04-28 MED ORDER — AMOXICILLIN-POT CLAVULANATE 875-125 MG PO TABS: 1.0000 | ORAL_TABLET | Freq: Two times a day (BID) | ORAL | 0 refills | Status: DC

## 2016-04-28 MED ORDER — TRAMADOL HCL 50 MG PO TABS: 50.0000 mg | ORAL_TABLET | Freq: Four times a day (QID) | ORAL | 0 refills | Status: DC | PRN

## 2016-04-28 NOTE — Progress Notes (Signed)
  General Surgery [facility]  Assessment & Plan:  Gallstone pancreatitis  Acute episode resolved  Tolerating diet - would advance to regular low fat diet until surgery CAD / cardiology evaluation  Plans for out-patient echo and stress test noted UTI  On abx per medical team Disposition  Plans for SNF placement noted  Will arrange follow up at [facility] office with Dr. Ovidio Kinavid Newman as previously planned.  Will await cardiology clearance before seeing in office to schedule gallbladder surgery.  Will sign off.  Call if needed while in-patient.        [name] M. [name], MD, [facility]       [facility]       Office: [phone]    Subjective: Patient in bed, comfortable, no complaints.  Tolerating po diet.  Objective: Vital signs in last 24 hours: Temp:  [98.1 F (36.7 C)-98.3 F (36.8 C)] 98.2 F (36.8 C) (11/13 0433) Pulse Rate:  [83-101] 83 (11/13 0433) Resp:  [18-20] 20 (11/13 0433) BP: (119-125)/(59-75) 119/59 (11/13 0433) SpO2:  [95 %-97 %] 97 % (11/13 0433) Weight:  [73.6 kg (162 lb 4.1 oz)] 73.6 kg (162 lb 4.1 oz) (11/13 0433) Last BM Date: 04/25/16  Intake/Output from previous day: 11/12 0701 - 11/13 0700 In: 840 [P.O.:840] Out: 400 [Urine:400] Intake/Output this shift: Total I/O In: -  Out: 300 [Urine:300]  Physical Exam: HEENT - sclerae clear, mucous membranes moist Neck - soft Abdomen - soft without distension; non-tender; no mass Neuro - alert & oriented, no focal deficits  Lab Results:  No results for input(s): WBC, HGB, HCT, PLT in the last 72 hours. BMET  Recent Labs  04/27/16 0417 04/28/16 0445  NA 132* 131*  K 4.2 4.1  CL 104 100*  CO2 25 25  GLUCOSE 130* 133*  BUN 14 19  CREATININE 0.68 0.67  CALCIUM 8.1* 8.4*   PT/INR No results for input(s): LABPROT, INR in the last 72 hours. Comprehensive Metabolic Panel:    Component Value Date/Time   NA 131 (L) 04/28/2016 0445   NA 132 (L) 04/27/2016 0417   K  4.1 04/28/2016 0445   K 4.2 04/27/2016 0417   CL 100 (L) 04/28/2016 0445   CL 104 04/27/2016 0417   CO2 25 04/28/2016 0445   CO2 25 04/27/2016 0417   BUN 19 04/28/2016 0445   BUN 14 04/27/2016 0417   CREATININE 0.67 04/28/2016 0445   CREATININE 0.68 04/27/2016 0417   GLUCOSE 133 (H) 04/28/2016 0445   GLUCOSE 130 (H) 04/27/2016 0417   CALCIUM 8.4 (L) 04/28/2016 0445   CALCIUM 8.1 (L) 04/27/2016 0417   AST 21 04/23/2016 0507   AST 26 04/22/2016 1554   ALT 27 04/23/2016 0507   ALT 34 04/22/2016 1554   ALKPHOS 56 04/23/2016 0507   ALKPHOS 70 04/22/2016 1554   BILITOT 1.7 (H) 04/23/2016 0507   BILITOT 2.1 (H) 04/22/2016 1554   PROT 5.7 (L) 04/23/2016 0507   PROT 7.5 04/22/2016 1554   ALBUMIN 2.7 (L) 04/23/2016 0507   ALBUMIN 3.6 04/22/2016 1554    Studies/Results: No results found.    Colin West M 04/28/2016  Patient ID: Colin West, male   DOB: [birth year], 82 y.o.   MRN: [id number]

## 2016-04-28 NOTE — Progress Notes (Signed)
Pt has a bed available at [facility] today pending medical clearance.   Dellie BurnsJosie Elicia Lui, MSW, LCSW [phone] (coverage)

## 2016-04-28 NOTE — Progress Notes (Signed)
Physical Therapy Treatment Patient Details Name: Colin West N [place]styne MRN: [id number] DOB: 01-Sep-1933 Today's Date: 04/28/2016    History of Present Illness The patient is an 82 yo wm who presents with weakness and sepsis syndrome. He was admitted about 3 weeks ago with hemorrhagic gallstone pancreatitis. Over the last few days he did develop some nausea and loss of appetite. He denies abdominal pain. CT shows persistent inflammation around pancreas that is unchanged -     PT Comments    Pt assisted with ambulating in hallway.  Per RN and chart review, pt does not have 24/7 assist at home. Pt would benefit from ST-SNF upon d/c.  Follow Up Recommendations  Supervision/Assistance - 24 hour;SNF     Equipment Recommendations  None recommended by PT    Recommendations for Other Services       Precautions / Restrictions Precautions Precautions: Fall Restrictions Weight Bearing Restrictions: No    Mobility  Bed Mobility Overal bed mobility: Needs Assistance Bed Mobility: Supine to Sit     Supine to sit: Min assist     General bed mobility comments: assist with trunk, extra time   Transfers Overall transfer level: Needs assistance Equipment used: Rolling walker (2 wheeled) Transfers: Sit to/from Stand Sit to Stand: Min assist         General transfer comment: close guard for safety, extra time. assist to rise, verbal cues for safe technique  Ambulation/Gait Ambulation/Gait assistance: Min assist Ambulation Distance (Feet): 80 Feet Assistive device: Rolling walker (2 wheeled) Gait Pattern/deviations: Step-through pattern;Shuffle;Trunk flexed     General Gait Details: verbal cues for RW distance and posture, assist for turning   Stairs            Wheelchair Mobility    Modified Rankin (Stroke Patients Only)       Balance                                    Cognition Arousal/Alertness: Awake/alert Behavior During Therapy: WFL for tasks  assessed/performed Overall Cognitive Status: Within Functional Limits for tasks assessed                      Exercises      General Comments        Pertinent Vitals/Pain Pain Assessment: No/denies pain    Home Living                      Prior Function            PT Goals (current goals can now be found in the care plan section) Progress towards PT goals: Progressing toward goals    Frequency    Min 3X/week      PT Plan Discharge plan needs to be updated    Co-evaluation             End of Session   Activity Tolerance: Patient tolerated treatment well Patient left: in chair;with call bell/phone within reach     Time: [id number] PT Time Calculation (min) (ACUTE ONLY): 15 min  Charges:  $Gait Training: 8-22 mins                    G Codes:      Elanda Garmany,KATHrine E 04/28/2016, 11:59 AM Zenovia JarredKati Reggie Bise, PT, DPT 04/28/2016 Pager: [phone]

## 2016-04-28 NOTE — Discharge Summary (Addendum)
Physician Discharge Summary  RUXIN RANSOME ZOX:[id number] DOB: 1933/08/16 DOA: 04/22/2016  PCP: [name], MD  Admit date: 04/22/2016 Discharge date: 04/28/2016  Admitted From: Home(Home, ALF, ILF, SNF) Disposition:  [facility]  Recommendations for Outpatient Follow-up:  1. Follow up with Cardiology in 1-2 weeks, Recheck a 2-D echo and probably a stress as an outpatient. Please obtain BMP/CBC in one week   Home Health:No Equipment/Devices:None  Discharge Condition:stable CODE STATUS:full Diet recommendation: Heart Healthy   Brief/Interim Summary: 82 y.o. male with history of HTN o/w healthy admitted here 2 weeks ago with acute pancreatitis and gallstones, felt to be gallstone pancreatitis. He had some hemorrhagic changes by CT, recovered slowly, was Rx with Imipenem, NG tube feeds. WBC high, eventually improved and pt was dc'd on 04/07/16. Plan was to have GB removed at a later date. Pt here now with gen'd weakness for 3-4 days.Severe sepsis with elevated blood gases leukocytosis and urine culture growing 100k colonies, his lipase was normal. He was treated empirically with IV vancomycin and cefepime. Once his urine cultures came back daily escalated to oral ampicillin. Patient was having since admission sinus tachycardia with a mild elevation in his cardiac troponins with hard to control heart rate cardiology was consulted a 2-D echo was done that showed a EF of 50%. Cardiology recommended conservative management and repeat 2-D echo and a stress test as an outpatient in 3-4 weeks. Surgery was consulted and appreciate assistance and would like to have him evaluated from a cardiac standpoint and recommended surgical intervention to remove the gallbladder in 4-6 weeks as an outpatient after cardiac work up is completed.  Discharge Diagnoses:  Active Problems:   Essential hypertension   Cholelithiasis   Sepsis (HCC)   Leukocytosis   Hypotension   Sinus tachycardia    History of acute pancreatitis   SOB (shortness of breath)   Elevated troponin   Congestive dilated cardiomyopathy (HCC)   SVT (supraventricular tachycardia) (HCC)  Severe Sepsis due to E. Coli pyelonephritis: On admission he was started empirically on IV vancomycin and cefepime. Urine culture grew out more than 100,000 colonies of enterococcus species pansensitive.  He was changed to oral Augmentin which she will continue as an outpatient. He is tolerating diet well No abdominal pain. Blood cultures remain negative till date. Physical therapy evaluated the patient and recommended skilled nursing facility.  Acute kidney injury: In setting of severe sepsis likely prerenal, resolved with IV fluid hydration. No JVD physical exam lungs are clear. Follow strict I's and O's.  Essential hypertension Pressure at goal Continue hold antihypertensive medication. Blood pressure is stable.  Cholelithiasis Surgery following an appreciate assistance. Surgery to follow-up as an outpatient for possible cholecystectomy as an outpatient.    Leukocytosis Likely due to sepsis, now resolved.  Narrow complex tachycardia and Dilated cardiomyopathy : Likely due to Sepsis,home dose metoprolol to 50 mg was continued, as his heart rate was hard to control cardiology was consulted, titrate his metoprolol, and his heart rate became controlled.  2-D echo showed a significant drop in ejection fraction of 30-35% with a grade 1 diastolic heart failure appreciate cardiology's assistance. Cardiology question if this is septic cardiomyopathy versus tachycardia mediated cardiomyopathy. Would favor medical therapy until he recovers from his pancreatitis and sepsis syndrome, with a nuclear stress test as an outpatient and 2-D echo. He was started on low-dose ace hydroxide was DC'd and follow-up with cardiology as an outpatient for a stress as an 2-D echo as an outpatient.   Discharge  Instructions     Medication  List    STOP taking these medications   lisinopril-hydrochlorothiazide 20-25 MG tablet Commonly known as:  PRINZIDE,ZESTORETIC     TAKE these medications   amoxicillin-clavulanate 875-125 MG tablet Commonly known as:  AUGMENTIN Take 1 tablet by mouth every 12 (twelve) hours.   famotidine 20 MG tablet Commonly known as:  PEPCID Take 1 tablet (20 mg total) by mouth 2 (two) times daily.   feeding supplement (GLUCERNA SHAKE) Liqd Take 237 mLs by mouth 3 (three) times daily between meals.   lipase/protease/amylase 25366 UNITS Cpep capsule Commonly known as:  CREON Take 1 capsule (36,000 Units total) by mouth 3 (three) times daily before meals.   lisinopril 2.5 MG tablet Commonly known as:  PRINIVIL,ZESTRIL Take 1 tablet (2.5 mg total) by mouth daily. Start taking on:  04/29/2016   lovastatin 20 MG tablet Commonly known as:  MEVACOR Take 20 mg by mouth daily.   metFORMIN 500 MG 24 hr tablet Commonly known as:  GLUCOPHAGE-XR Take 500 mg by mouth daily with breakfast.   metoCLOPramide 5 MG tablet Commonly known as:  REGLAN Take 5-10 mg by mouth every 8 (eight) hours as needed (for hiccups).   metoprolol 100 MG tablet Commonly known as:  LOPRESSOR Take 1 tablet (100 mg total) by mouth 2 (two) times daily. What changed:  medication strength  how much to take   potassium chloride SA 20 MEQ tablet Commonly known as:  K-DUR,KLOR-CON Take 1 tablet (20 mEq total) by mouth daily.   traMADol 50 MG tablet Commonly known as:  ULTRAM Take 1 tablet (50 mg total) by mouth every 6 (six) hours as needed for moderate pain.       No Known Allergies  Consultations: Cardiology Surgery  Procedures/Studies: Ct Abdomen Pelvis Wo Contrast  Result Date: 04/22/2016 CLINICAL DATA:  Leukocytosis, sepsis. History of recent pancreatitis. EXAM: CT ABDOMEN AND PELVIS WITHOUT CONTRAST TECHNIQUE: Multidetector CT imaging of the abdomen and pelvis was performed following the standard  protocol without IV contrast. COMPARISON:  CT from 04/03/2016 FINDINGS: Lower chest: Bibasilar atelectasis. Stable scarring at the right base. No pneumonic consolidation or pneumothorax. Old right sixth and seventh rib fractures. Normal size cardiac chambers without significant pericardial effusion. Coronary arteriosclerosis is seen. Hepatobiliary: Hepatic steatosis. No space-occupying mass of the liver though assessment is limited by lack of IV contrast. Uncomplicated cholelithiasis. No biliary dilatation. Pancreas: The pancreas line is difficult to discretely identify due to the surrounding peripancreatic fatty inflammation and edema. This appears overall stable without apparent findings of pancreatic necrosis nor pseudocyst formation. Spleen: Normal in size without focal abnormality. Adrenals/Urinary Tract: Normal normal bilateral adrenal glands. Two punctate interpolar right renal calculi without obstructive uropathy. Apparent passage of the left UVJ stone since prior exam. No hydroureteronephrosis is noted. Stable bilateral perinephric fat stranding. Stomach/Bowel: Stomach is moderately distended with enteric contrast. No bowel obstruction or definite inflammation. Extensive colonic diverticulosis is noted along the colon, in particular involving the sigmoid colon. No findings of acute diverticulitis. No definite abscess. Colonic interposition of the liver seen. There is a normal-appearing appendix. Vascular/Lymphatic: Aortic and branch vessel atherosclerosis. No intraperitoneal, retroperitoneal, pelvic sidewall or inguinal lymphadenopathy. Reproductive: The prostate is enlarged with calcifications as before at the junction of the central and peripheral zone. Other: No abdominal wall hernia or abnormality. No abdominopelvic ascites. Musculoskeletal: No worrisome lytic or sclerotic osseous lesions. IMPRESSION: 1. Stable sequela of pancreatitis without pancreatic necrosis, definite abscess on this unenhanced  study nor pseudocyst  formation. 2. Uncomplicated cholelithiasis. 3. Passage of left UVJ stone since prior exam. 4. Stable hepatic steatosis. 5. Abdominal aortic atherosclerosis. Electronically Signed   By: Tollie Ethavid  Kwon M.D.   On: 04/22/2016 18:07   Dg Chest 1 View  Result Date: 04/26/2016 CLINICAL DATA:  Productive cough, diabetes, hypertension EXAM: CHEST 1 VIEW COMPARISON:  He 04/24/2016 FINDINGS: Right PICC line tip remains at the SVC RA junction level. Low lung volumes persist with slight interstitial prominence as before. Postop changes in the right lung. No developing focal pneumonia, collapse or consolidation. Negative for effusion or pneumothorax. Trachea is midline. IMPRESSION: Stable low lung volumes and slight interstitial prominence, nonspecific. Stable postop changes in the right lung No significant interval change. Electronically Signed   By: Judie PetitM.  Shick M.D.   On: 04/26/2016 09:16   Dg Chest 2 View  Result Date: 04/22/2016 CLINICAL DATA:  Strain fatigue and left G which began earlier today. History of diabetes and hypertension and unspecified lung surgery. Former smoker. EXAM: CHEST  2 VIEW COMPARISON:  Portable chest x-ray of March 28, 2016 FINDINGS: There is mild chronic volume loss on the right with elevation of the hemidiaphragm. The aerated portion of the right lung is clear. The left lung is clear. The heart and pulmonary vascularity are normal. There is old deformity of the posterior aspect of the right sixth and seventh ribs. There is degenerative change of the left shoulder. The thoracic spine exhibits mild multilevel degenerative disc space narrowing. IMPRESSION: Chronic volume loss on the right with mild elevation of the hemidiaphragm. No evidence of pneumonia, CHF, nor other acute cardiopulmonary abnormality. Electronically Signed   By: David  Swazil[place]Jordan M.D.   On: 04/22/2016 15:39   Ct Abdomen Pelvis W Contrast  Result Date: 04/03/2016 CLINICAL DATA:  Pancreatitis. EXAM: CT  ABDOMEN AND PELVIS WITH CONTRAST TECHNIQUE: Multidetector CT imaging of the abdomen and pelvis was performed using the standard protocol following bolus administration of intravenous contrast. CONTRAST:  100mL ISOVUE-300 IOPAMIDOL (ISOVUE-300) INJECTION 61% COMPARISON:  03/26/2016 FINDINGS: Lower chest:  Stable areas of scarring right base. Hepatobiliary: The liver shows diffusely decreased attenuation suggesting steatosis. Gallstones again noted. No intrahepatic or extrahepatic biliary dilation. Pancreas: Interval slight progression of diffuse peripancreatic edema/inflammation. Pancreas enhances throughout with no overt findings of pancreatic necrosis. No evidence for organized pseudocyst or rim enhancing abscess. Spleen: No splenomegaly. No focal mass lesion. Adrenals/Urinary Tract: No adrenal nodule or mass. No evidence for enhancing lesion in either kidney. No evidence for hydroureter. Left UVJ stone is unchanged. The urinary bladder appears normal for the degree of distention. Stomach/Bowel: Stomach is distended with fluid. A feeding tube tip is positioned in the transverse duodenum. Duodenum is mildly distended. No small bowel wall thickening. No small bowel dilatation. The terminal ileum is normal. The appendix is normal. Diverticuli are seen scattered along the entire length of the colon without CT findings of diverticulitis. Vascular/Lymphatic: There is abdominal aortic atherosclerosis without aneurysm. As before, there is marked mass-effect/attenuation of the superior mesenteric vein just proximal to the porta splenic confluence. Mass-effect on the splenic vein has increased in the interval although the vessel does remain patent. No evidence for pseudoaneurysm. Celiac axis and SMA opacified. There is no gastrohepatic or hepatoduodenal ligament lymphadenopathy. No intraperitoneal or retroperitoneal lymphadenopathy. No pelvic sidewall lymphadenopathy. Reproductive: Prostate gland is enlarged. Other: Trace  intraperitoneal free fluid noted in the pelvis. Musculoskeletal: Bone windows reveal no worrisome lytic or sclerotic osseous lesions. IMPRESSION: 1. Slight interval progression of diffuse peripancreatic edema/inflammation. No  evidence for pancreatic necrosis. No organized pseudocyst or rim enhancing fluid collection to suggest abscess. 2. Mass-effect on the superior mesenteric vein and splenic vein, slightly progressed in the interval although both vessels remain patent. 3. Interval decrease in intraperitoneal free fluid. 4. Cholelithiasis. 5. Stable appearance left UVJ stone without left hydroureteronephrosis. 6. Hepatic steatosis. 7. Interval resolution of left pleural effusion with persistent tiny right pleural effusion. 8. Abdominal aortic atherosclerosis. Electronically Signed   By: Kennith CenterEric  Mansell M.D.   On: 04/03/2016 17:05   Dg Chest Port 1 View  Result Date: 04/24/2016 CLINICAL DATA:  Central line placement EXAM: PORTABLE CHEST 1 VIEW COMPARISON:  04/24/2016 400 hours FINDINGS: Right upper extremity PICC placed. Tip is at the cavoatrial junction. Low volumes. Stable ovoid density towards the right apex. Normal heart size. No pneumothorax. IMPRESSION: Right upper extremity PICC placed. Tip is at the cavoatrial junction. Electronically Signed   By: Jolaine ClickArthur  Hoss M.D.   On: 04/24/2016 13:31   Dg Chest Port 1 View  Result Date: 04/24/2016 CLINICAL DATA:  Short of breath EXAM: PORTABLE CHEST 1 VIEW COMPARISON:  04/22/2016 FINDINGS: Normal heart size. Ovoid density at the right apex is stable. Lungs are very under aerated but otherwise grossly clear. Chronic right rib deformities. No pneumothorax. IMPRESSION: Stable examination.  Stable ovoid density at the right apex. Electronically Signed   By: Jolaine ClickArthur  Hoss M.D.   On: 04/24/2016 07:56      Subjective: He relates he feels great no new complaints.  Discharge Exam: Vitals:   04/27/16 1957 04/28/16 0433  BP: 125/66 (!) 119/59  Pulse: (!) 101 83   Resp: 18 20  Temp: 98.3 F (36.8 C) 98.2 F (36.8 C)   Vitals:   04/27/16 0423 04/27/16 1530 04/27/16 1957 04/28/16 0433  BP: 134/70 125/75 125/66 (!) 119/59  Pulse: (!) 103 97 (!) 101 83  Resp: 16 18 18 20   Temp: 98.1 F (36.7 C) 98.1 F (36.7 C) 98.3 F (36.8 C) 98.2 F (36.8 C)  TempSrc: Oral Oral Oral Oral  SpO2: 95% 95% 95% 97%  Weight: 74.8 kg (164 lb 14.5 oz)   73.6 kg (162 lb 4.1 oz)  Height:        General: Pt is alert, awake, not in acute distress Cardiovascular: RRR, S1/S2 +, no rubs, no gallops Respiratory: CTA bilaterally, no wheezing, no rhonchi Abdominal: Soft, NT, ND, bowel sounds + Extremities: no edema, no cyanosis    The results of significant diagnostics from this hospitalization (including imaging, microbiology, ancillary and laboratory) are listed below for reference.     Microbiology: Recent Results (from the past 240 hour(s))  Culture, blood (Routine x 2)     Status: None   Collection Time: 04/22/16  3:54 PM  Result Value Ref Range Status   Specimen Description BLOOD RIGHT ANTECUBITAL  Final   Special Requests BOTTLES DRAWN AEROBIC AND ANAEROBIC 5CC  Final   Culture   Final    NO GROWTH 5 DAYS [facility]    Report Status 04/27/2016 FINAL  Final  Culture, blood (Routine x 2)     Status: None   Collection Time: 04/22/16  4:01 PM  Result Value Ref Range Status   Specimen Description BLOOD LEFT ANTECUBITAL  Final   Special Requests BOTTLES DRAWN AEROBIC AND ANAEROBIC 5CC  Final   Culture   Final    NO GROWTH 5 DAYS [facility]    Report Status 04/27/2016 FINAL  Final  Urine culture     Status: Abnormal   Collection Time: 04/22/16  4:14 PM  Result Value Ref Range Status   Specimen Description URINE, CLEAN CATCH  Final   Special Requests NONE  Final   Culture >=100,000 COLONIES/mL ENTEROCOCCUS FAECALIS (A)  Final   Report Status 04/24/2016 FINAL  Final   Organism ID, Bacteria ENTEROCOCCUS  FAECALIS (A)  Final      Susceptibility   Enterococcus faecalis - MIC*    AMPICILLIN <=2 SENSITIVE Sensitive     LEVOFLOXACIN 1 SENSITIVE Sensitive     NITROFURANTOIN <=16 SENSITIVE Sensitive     VANCOMYCIN 1 SENSITIVE Sensitive     * >=100,000 COLONIES/mL ENTEROCOCCUS FAECALIS  MRSA PCR Screening     Status: None   Collection Time: 04/23/16 10:42 AM  Result Value Ref Range Status   MRSA by PCR NEGATIVE NEGATIVE Final    Comment:        The GeneXpert MRSA Assay (FDA approved for NASAL specimens only), is one component of a comprehensive MRSA colonization surveillance program. It is not intended to diagnose MRSA infection nor to guide or monitor treatment for MRSA infections.      Labs: BNP (last 3 results)  Recent Labs  04/05/16 0647  BNP 170.5*   Basic Metabolic Panel:  Recent Labs Lab 04/24/16 0305 04/25/16 0305 04/26/16 0401 04/27/16 0417 04/28/16 0445  NA 132* 132* 131* 132* 131*  K 3.1* 3.3* 3.4* 4.2 4.1  CL 104 104 102 104 100*  CO2 21* 24 24 25 25   GLUCOSE 99 135* 141* 130* 133*  BUN 24* 21* 15 14 19   CREATININE 1.03 0.76 0.70 0.68 0.67  CALCIUM 8.2* 7.9* 7.9* 8.1* 8.4*  MG  --  1.6* 1.6* 1.8 1.8   Liver Function Tests:  Recent Labs Lab 04/22/16 1554 04/23/16 0507  AST 26 21  ALT 34 27  ALKPHOS 70 56  BILITOT 2.1* 1.7*  PROT 7.5 5.7*  ALBUMIN 3.6 2.7*    Recent Labs Lab 04/22/16 1554  LIPASE 22  AMYLASE 30   No results for input(s): AMMONIA in the last 168 hours. CBC:  Recent Labs Lab 04/22/16 1554 04/23/16 0507 04/25/16 0305  WBC 30.6* 22.7* 16.6*  NEUTROABS 27.3*  --   --   HGB 15.3 12.8* 11.0*  HCT 44.3 38.4* 32.0*  MCV 96.5 97.0 93.8  PLT 266 211 161   Cardiac Enzymes:  Recent Labs Lab 04/23/16 1852 04/24/16 0305  TROPONINI 0.17* 0.09*   BNP: Invalid input(s): POCBNP CBG:  Recent Labs Lab 04/27/16 1734 04/27/16 2003 04/28/16 0001 04/28/16 0425 04/28/16 0749  GLUCAP 208* 234* 152* 128* 123*    D-Dimer No results for input(s): DDIMER in the last 72 hours. Hgb A1c No results for input(s): HGBA1C in the last 72 hours. Lipid Profile No results for input(s): CHOL, HDL, LDLCALC, TRIG, CHOLHDL, LDLDIRECT in the last 72 hours. Thyroid function studies No results for input(s): TSH, T4TOTAL, T3FREE, THYROIDAB in the last 72 hours.  Invalid input(s): FREET3 Anemia work up No results for input(s): VITAMINB12, FOLATE, FERRITIN, TIBC, IRON, RETICCTPCT in the last 72 hours. Urinalysis    Component Value Date/Time   COLORURINE ORANGE (A) 04/22/2016 1614   APPEARANCEUR CLOUDY (A) 04/22/2016 1614   LABSPEC 1.023 04/22/2016 1614   PHURINE 6.0 04/22/2016 1614   GLUCOSEU 100 (A) 04/22/2016 1614   HGBUR NEGATIVE 04/22/2016 1614   BILIRUBINUR SMALL (A) 04/22/2016 1614   KETONESUR NEGATIVE 04/22/2016 1614   PROTEINUR 100 (A) 04/22/2016 1614  UROBILINOGEN 0.2 03/08/2010 1026   NITRITE NEGATIVE 04/22/2016 1614   LEUKOCYTESUR SMALL (A) 04/22/2016 1614   Sepsis Labs Invalid input(s): PROCALCITONIN,  WBC,  LACTICIDVEN Microbiology Recent Results (from the past 240 hour(s))  Culture, blood (Routine x 2)     Status: None   Collection Time: 04/22/16  3:54 PM  Result Value Ref Range Status   Specimen Description BLOOD RIGHT ANTECUBITAL  Final   Special Requests BOTTLES DRAWN AEROBIC AND ANAEROBIC 5CC  Final   Culture   Final    NO GROWTH 5 DAYS Performed at [facility]    Report Status 04/27/2016 FINAL  Final  Culture, blood (Routine x 2)     Status: None   Collection Time: 04/22/16  4:01 PM  Result Value Ref Range Status   Specimen Description BLOOD LEFT ANTECUBITAL  Final   Special Requests BOTTLES DRAWN AEROBIC AND ANAEROBIC 5CC  Final   Culture   Final    NO GROWTH 5 DAYS Performed at [facility]    Report Status 04/27/2016 FINAL  Final  Urine culture     Status: Abnormal   Collection Time: 04/22/16  4:14 PM  Result Value Ref Range Status   Specimen  Description URINE, CLEAN CATCH  Final   Special Requests NONE  Final   Culture >=100,000 COLONIES/mL ENTEROCOCCUS FAECALIS (A)  Final   Report Status 04/24/2016 FINAL  Final   Organism ID, Bacteria ENTEROCOCCUS FAECALIS (A)  Final      Susceptibility   Enterococcus faecalis - MIC*    AMPICILLIN <=2 SENSITIVE Sensitive     LEVOFLOXACIN 1 SENSITIVE Sensitive     NITROFURANTOIN <=16 SENSITIVE Sensitive     VANCOMYCIN 1 SENSITIVE Sensitive     * >=100,000 COLONIES/mL ENTEROCOCCUS FAECALIS  MRSA PCR Screening     Status: None   Collection Time: 04/23/16 10:42 AM  Result Value Ref Range Status   MRSA by PCR NEGATIVE NEGATIVE Final    Comment:        The GeneXpert MRSA Assay (FDA approved for NASAL specimens only), is one component of a comprehensive MRSA colonization surveillance program. It is not intended to diagnose MRSA infection nor to guide or monitor treatment for MRSA infections.      Time coordinating discharge: Over 30 minutes  SIGNED:   Marinda Elk, MD  [facility] 04/28/2016, 10:32 AM Pager   If 7PM-7AM, please contact night-coverage [email or web address] Password [email or web address]

## 2016-04-28 NOTE — Clinical Social Work Placement (Signed)
Patient is set to discharge to [facility] today. Patient & son, [name] made aware. Discharge packet given to RN, Charline BillsKristyn. [facility] called for transport.     Lincoln MaxinKelly Jawad Wiacek, LCSW [facility] Clinical Social Worker cell #: [phone]    CLINICAL SOCIAL WORK PLACEMENT  NOTE  Date:  04/28/2016  Patient Details  Name: Colin West MRN: [id number] Date of Birth: Nov 05, 1933  Clinical Social Work is seeking post-discharge placement for this patient at the Skilled  Nursing Facility level of care (*CSW will initial, date and re-position this form in  chart as items are completed):  Yes   Patient/family provided with [place] Clinical Social Work Department's list of facilities offering this level of care within the geographic area requested by the patient (or if unable, by the patient's family).  Yes   Patient/family informed of their freedom to choose among providers that offer the needed level of care, that participate in Medicare, Medicaid or managed care program needed by the patient, have an available bed and are willing to accept the patient.  Yes   Patient/family informed of 's ownership interest in [facility] and [facility], as well as of the fact that they are under no obligation to receive care at these facilities.  PASRR submitted to EDS on       PASRR number received on       Existing PASRR number confirmed on  (Has existing number but unable to pull up 04/27/16)     FL2 transmitted to all facilities in geographic area requested by pt/family on 04/27/16     FL2 transmitted to all facilities within larger geographic area on       Patient informed that his/her managed care company has contracts with or will negotiate with certain facilities, including the following:        Yes   Patient/family informed of bed offers received.  Patient chooses bed at [facility]     Physician recommends and patient chooses bed at      Patient  to be transferred to [facility] on 04/28/16.  Patient to be transferred to facility by [facility]     Patient family notified on 04/28/16 of transfer.  Name of family member notified:  patient's son, [name]     PHYSICIAN       Additional Comment:    _______________________________________________ Arlyss RepressHarrison, Arihaan Bellucci F, LCSW 04/28/2016, 3:40 PM

## 2016-04-28 NOTE — Progress Notes (Signed)
Patient Name: Colin West Date of Encounter: 04/28/2016  [facility] Cardi[facility]  Hospital Problem List     Active Problems:   Essential hypertension   Cholelithiasis   Sepsis (HCC)   Leukocytosis   Hypotension   Sinus tachycardia   History of acute pancreatitis   SOB (shortness of breath)   Elevated troponin   Congestive dilated cardiomyopathy (HCC)   SVT (supraventricular tachycardia) (HCC)     Subjective   No chest pain and no SOB, no abd pain  Inpatient Medications    Scheduled Meds: . amoxicillin-clavulanate  1 tablet Oral Q12H  . feeding supplement (GLUCERNA SHAKE)  237 mL Oral TID BM  . insulin aspart  0-9 Units Subcutaneous Q4H  . lipase/protease/amylase  36,000 Units Oral TID AC  . lisinopril  2.5 mg Oral Daily  . metoprolol tartrate  100 mg Oral BID  . pantoprazole  40 mg Oral BID  . sodium chloride flush  10-40 mL Intracatheter Q12H  . zolpidem  5 mg Oral QHS   Continuous Infusions:  PRN Meds: guaiFENesin-dextromethorphan, HYDROcodone-acetaminophen, metoCLOPramide, metoprolol, ondansetron **OR** ondansetron (ZOFRAN) IV, oxyCODONE-acetaminophen, polyvinyl alcohol, sodium chloride flush   Vital Signs    Vitals:   04/27/16 0423 04/27/16 1530 04/27/16 1957 04/28/16 0433  BP: 134/70 125/75 125/66 (!) 119/59  Pulse: (!) 103 97 (!) 101 83  Resp: 16 18 18 20   Temp: 98.1 F (36.7 C) 98.1 F (36.7 C) 98.3 F (36.8 C) 98.2 F (36.8 C)  TempSrc: Oral Oral Oral Oral  SpO2: 95% 95% 95% 97%  Weight: 164 lb 14.5 oz (74.8 kg)   162 lb 4.1 oz (73.6 kg)  Height:        Intake/Output Summary (Last 24 hours) at 04/28/16 0833 Last data filed at 04/28/16 0710  Gross per 24 hour  Intake              840 ml  Output              700 ml  Net              140 ml   Filed Weights   04/26/16 1121 04/27/16 0423 04/28/16 0433  Weight: 156 lb 12 oz (71.1 kg) 164 lb 14.5 oz (74.8 kg) 162 lb 4.1 oz (73.6 kg)    Physical Exam    GEN: thin male in  chair,  in no acute distress.  HEENT: normocephalic, sclera clear, mucus membranes moist.  Neck: Supple, no JVD Cardiac: RRR, no murmurs, rubs, or gallops. No clubbing, cyanosis, edema.  Radials/DP/PT 2+ and equal bilaterally.  Respiratory:  Respirations regular and unlabored, clear to auscultation bilaterally without rales, rhonchi or wheezes. GI: Abd -Soft, nontender, nondistended, BS + x 4. MS: no deformity or atrophy. Skin: warm and dry, brisk capillary refill, no obvious rash Neuro:  Alert and oriented X 3 MAE, follows commands Psych: answers questions appropriately,Normal and pleasant affect.   Labs    CBC No results for input(s): WBC, NEUTROABS, HGB, HCT, MCV, PLT in the last 72 hours. Basic Metabolic Panel  Recent Labs  04/27/16 0417 04/28/16 0445  NA 132* 131*  K 4.2 4.1  CL 104 100*  CO2 25 25  GLUCOSE 130* 133*  BUN 14 19  CREATININE 0.68 0.67  CALCIUM 8.1* 8.4*  MG 1.8 1.8   Liver Function Tests No results for input(s): AST, ALT, ALKPHOS, BILITOT, PROT, ALBUMIN in the last 72 hours. No results for input(s): LIPASE, AMYLASE in the  last 72 hours. Cardiac Enzymes No results for input(s): CKTOTAL, CKMB, CKMBINDEX, TROPONINI in the last 72 hours. BNP Invalid input(s): POCBNP D-Dimer No results for input(s): DDIMER in the last 72 hours. Hemoglobin A1C No results for input(s): HGBA1C in the last 72 hours. Fasting Lipid Panel No results for input(s): CHOL, HDL, LDLCALC, TRIG, CHOLHDL, LDLDIRECT in the last 72 hours. Thyroid Function Tests No results for input(s): TSH, T4TOTAL, T3FREE, THYROIDAB in the last 72 hours.  Invalid input(s): FREET3  Telemetry    SR to ST at 112  or so more yesterday, slower today - Personally Reviewed  ECG     04/26/16  SR no acute changes from previous.- Personally Reviewed  Radiology    EXAM: CHEST 1 VIEW  COMPARISON:  He 04/24/2016  FINDINGS: Right PICC line tip remains at the SVC RA junction level. Low  lung volumes persist with slight interstitial prominence as before. Postop changes in the right lung. No developing focal pneumonia, collapse or consolidation. Negative for effusion or pneumothorax. Trachea is midline.  IMPRESSION: Stable low lung volumes and slight interstitial prominence, nonspecific.  Stable postop changes in the right lung  No significant interval change.    Cardiac Studies   Echo:  04/25/16  Study Conclusions  - Left ventricle: The cavity size was normal. Systolic function was   moderately to severely reduced. The estimated ejection fraction   was in the range of 30% to 35%. Doppler parameters are consistent   with abnormal left ventricular relaxation (grade 1 diastolic   dysfunction). - Aortic valve: There was mild regurgitation. - Mitral valve: There was moderate regurgitation. - Left atrium: The atrium was mildly dilated.  Impressions:  - There is diffuse hypokinesis more pronounced in the inferior,   inferolateral and inferoseptal walls. Right ventricle is poorly   visualized, EF can&'t be estimated.   There was no evidence of a vegetation.  Patient Profile       82 yo male w/ PMH of of recent acute pancreatitis, cholelithiasis, HTN, and Type 2 DM who presented to [facility] ED on 04/22/2016 for generalized weakness. Diagnosed with Urosepsis. Cards consulted for tachycardia. Also with cardiomyopathy on echo.   Assessment & Plan    1. Narrow-Complex Tachycardia - currently admitted with Sepsis secondary to UTI.  - EKG on admission showed sinus tachycardia, he continues to have frequent episodes of SVT with ventricular rate 140-160 BPM,  - toprol XL 100 switched to metoprolol tartrate 100 mg PO BID-  No further episodes occ PVCs  2. Elevated Troponin - cyclic troponin values have been 0.09 and 0.17.  - he denies any recent episodes of chest discomfort or dyspnea with exertion. No prior cardiac history. - enzymes not consistent with ACS-  may be demand ischemia with tachycardia  3. HTN - BP elevated; toprol XL 100 switched to metoprolol tartrate 100 mg PO BID  4. Urosepsis -Enterococcus Faecalis. Continue antibiotics  5. Pancreatitis - admitted from 03/23/2016 - 04/07/2016 for acute hemorrhagic pancreatitis thought to be secondary to gallstones. - surgery currently following and planning for cholecystectomy as inpatient vs. outpatient.   6. AKI - improved  7. Cardiomyopathy - Echo shows EF 30-35. Etiology of LV dysfunction unclear. Question septic cardiomyopathy or tachycardia mediated cardiomyopathy. He has not had chest pain and enzymes are not consistent with acute coronary syndrome. Per Dr. Delton See " I would favor medical therapy until he recovers from his recent pancreatitis and urosepsis. Could then have nuclear study to exclude ischemia as an outpatient  and also follow-up echocardiogram to see if LV function improves. If he requires cholecystectomy while in-house would need nuclear study preoperatively."  Pt is + 6,084 since admit with wt increase from 153 to 162 lbs. But lungs are clear.   Signed, Nada BoozerLaura Ingold, NP  04/28/2016, 8:33 AM  [facility] Pager [phone]  After 5 or weekends [phone]  I have seen and examined the patient along with Nada BoozerLaura Ingold, NP .  I have reviewed the chart, notes and new data.  I agree with her note.  Key new complaints: hiccups Key examination changes: normal CV exam today Key new findings / data: echo reviewed; no new arrhythmia on telemetry  PLAN: Will arrange outpatient follow up with echo and Lexiscan Myoview after DC from [facility].  Thurmon FairMihai Avanelle Pixley, MD, [facility] [phone] 04/28/2016, 11:57 AM

## 2016-04-30 ENCOUNTER — Encounter: Payer: Self-pay | Admitting: Internal Medicine

## 2016-04-30 ENCOUNTER — Non-Acute Institutional Stay (SKILLED_NURSING_FACILITY): Payer: Medicare Other | Admitting: Internal Medicine

## 2016-04-30 DIAGNOSIS — K851 Biliary acute pancreatitis without necrosis or infection: Secondary | ICD-10-CM | POA: Diagnosis not present

## 2016-04-30 DIAGNOSIS — I471 Supraventricular tachycardia: Secondary | ICD-10-CM

## 2016-04-30 DIAGNOSIS — I10 Essential (primary) hypertension: Secondary | ICD-10-CM

## 2016-04-30 DIAGNOSIS — N179 Acute kidney failure, unspecified: Secondary | ICD-10-CM | POA: Diagnosis not present

## 2016-04-30 DIAGNOSIS — E871 Hypo-osmolality and hyponatremia: Secondary | ICD-10-CM

## 2016-04-30 DIAGNOSIS — K219 Gastro-esophageal reflux disease without esophagitis: Secondary | ICD-10-CM

## 2016-04-30 DIAGNOSIS — E440 Moderate protein-calorie malnutrition: Secondary | ICD-10-CM

## 2016-04-30 DIAGNOSIS — R1114 Bilious vomiting: Secondary | ICD-10-CM

## 2016-04-30 DIAGNOSIS — R5381 Other malaise: Secondary | ICD-10-CM

## 2016-04-30 DIAGNOSIS — D72825 Bandemia: Secondary | ICD-10-CM

## 2016-04-30 DIAGNOSIS — E118 Type 2 diabetes mellitus with unspecified complications: Secondary | ICD-10-CM

## 2016-04-30 DIAGNOSIS — N10 Acute tubulo-interstitial nephritis: Secondary | ICD-10-CM | POA: Diagnosis not present

## 2016-04-30 DIAGNOSIS — D649 Anemia, unspecified: Secondary | ICD-10-CM | POA: Diagnosis not present

## 2016-04-30 NOTE — Progress Notes (Signed)
[facility]  PCP: Verl BangsADIONTCHENKO, ALEXEI, MD   Code Status: Full Code  Goals of care: Advanced Directive information Advanced Directives 04/22/2016  Does patient have an advance directive? No  Type of Advance Directive -  Does patient want to make changes to advanced directive? -  Copy of advanced directive(s) in chart? -       Extended Emergency Contact Information Primary Emergency Contact: Marny LowensteinSarkisian,Tonya  [place] Home Phone: [phone] Relation: Daughter Secondary Emergency Contact: Juanetta SnowFuquay,Rodney          [place] Home Phone: [phone] Relation: Brother   No Known Allergies  Chief Complaint  Patient presents with  . New Admit To SNF    New Admission Visit     HPI:  Patient is a 82 y.o. male seen today for short term rehabilitation post hospital admission from 04/22/16-04/28/16 with sepsis from E.coli pyelonephritis. He was started on iv antibiotics and later switched to oral antibiotics. He had acute renal failure and received iv fluids. He had narrow complex tachycardia and cardiology was consulted. His metoprolol dosing was adjusted. His echocardiogram showed EF of 50%. Of note, he was in the hospital few weeks ago with acute gallstone pancreatitis and required antibiotics. He has PMH of HTN. He is to undergo cholecystectomy after cardiac clearance post recovery from infection. He is seen in his room today.   Review of Systems:  Constitutional: Negative for fever, chills, diaphoresis. Feels weak and tired. HENT: Negative for headache, congestion, nasal discharge, sore throat, difficulty swallowing.   Eyes: Negative for blurred vision, double vision and discharge.  Respiratory: Negative for cough, shortness of breath and wheezing.   Cardiovascular: Negative for chest pain, palpitations, leg swelling.  Gastrointestinal: Negative for heartburn, abdominal pain. Positive for nausea and poor appetite x1  year per patient. Had nausea and vomiting last evening with bilious vomit. Had one episode of loose stool yesterday. denies melena or rectal bleed. Genitourinary: Negative for dysuria, hematuria and flank pain.  Musculoskeletal: Negative for back pain, fall in the facility.  Skin: Negative for itching, rash.  Neurological: Negative for dizziness. Psychiatric/Behavioral: Negative for depression.    Past Medical History:  Diagnosis Date  . Anxiety   . Carotid artery stenosis    a. < 40% bilateral stenosis by US in 2014  . Depression    denies  . Diabetes mellitus    denies  . History of kidney stones   . Hyperlipidemia   . Hypertension    Past Surgical History:  Procedure Laterality Date  . ELBOW ARTHROSCOPY Right   . INGUINAL HERNIA REPAIR Left Sept 2006   Dr Corliss Skainssuei  . INGUINAL HERNIA REPAIR Bilateral 06/01/2013   Procedure: LAPAROSCOPIC EXPLORATION BILATERAL INGUINAL HERNIA REPAIR;  Surgeon: Ardeth SportsmanSteven C. Gross, MD;  Location: [facility] OR;  Service: General;  Laterality: Bilateral;  Left Femoral, Left Recurrent inguinal, Right Inguinal  . INSERTION OF MESH Bilateral 06/01/2013   Procedure: INSERTION OF MESH;  Surgeon: Ardeth SportsmanSteven C. Gross, MD;  Location: [facility] OR;  Service: General;  Laterality: Bilateral;  Left Femoral, Left Recurrent inguinal, Right Inguinal  . LAPAROSCOPIC LYSIS OF ADHESIONS Bilateral 06/01/2013   Procedure: LAPAROSCOPIC LYSIS OF ADHESIONS;  Surgeon: Ardeth SportsmanSteven C. Gross, MD;  Location: [facility] OR;  Service: General;  Laterality: Bilateral;  . LUNG REMOVAL, PARTIAL  Sept. 2011    Dr. Edwyna ShellBurney  . ROTATOR CUFF REPAIR     Bilateral repair   Social History:   reports that he quit  smoking about 42 years ago. He has never used smokeless tobacco. He reports that he does not drink alcohol or use drugs.  Family History  Problem Relation Age of Onset  . Heart disease Brother     Heart Disease before age 80  . Heart attack Brother     Medications:   Medication List       Accurate as of  04/30/16 12:09 PM. Always use your most recent med list.          amoxicillin-clavulanate 875-125 MG tablet Commonly known as:  AUGMENTIN Take 1 tablet by mouth every 12 (twelve) hours.   famotidine 20 MG tablet Commonly known as:  PEPCID Take 1 tablet (20 mg total) by mouth 2 (two) times daily.   feeding supplement (GLUCERNA SHAKE) Liqd Take 237 mLs by mouth 3 (three) times daily between meals.   lipase/protease/amylase [id number] UNITS Cpep capsule Commonly known as:  CREON Take 1 capsule (36,000 Units total) by mouth 3 (three) times daily before meals.   lisinopril 2.5 MG tablet Commonly known as:  PRINIVIL,ZESTRIL Take 1 tablet (2.5 mg total) by mouth daily.   lovastatin 20 MG tablet Commonly known as:  MEVACOR Take 20 mg by mouth daily.   metFORMIN 500 MG 24 hr tablet Commonly known as:  GLUCOPHAGE-XR Take 500 mg by mouth daily with breakfast.   metoCLOPramide 5 MG tablet Commonly known as:  REGLAN Take 5-10 mg by mouth every 8 (eight) hours as needed (for hiccups).   metoprolol 100 MG tablet Commonly known as:  LOPRESSOR Take 1 tablet (100 mg total) by mouth 2 (two) times daily.   potassium chloride SA 20 MEQ tablet Commonly known as:  K-DUR,KLOR-CON Take 1 tablet (20 mEq total) by mouth daily.   traMADol 50 MG tablet Commonly known as:  ULTRAM Take 1 tablet (50 mg total) by mouth every 6 (six) hours as needed for moderate pain.       Physical Exam:  Vitals:   04/30/16 1205  BP: 108/63  Pulse: 93  Resp: 20  Temp: 98.3 F (36.8 C)  TempSrc: Oral  SpO2: 93%  Weight: 162 lb (73.5 kg)   Body mass index is 23.92 kg/m.  General- elderly male, frail, ill appearing, in no acute distress Head- normocephalic, atraumatic Nose- no maxillary or frontal sinus tenderness, no nasal discharge Throat- moist mucus membrane  Eyes- PERRLA, EOMI, no pallor, no icterus Neck- no cervical lymphadenopathy Cardiovascular- normal s1,s2, no murmur Respiratory- bilateral  clear to auscultation, no wheeze, no rhonchi, no crackles, no use of accessory muscles Abdomen- bowel sounds present, soft, non tender but mild distension present Musculoskeletal- able to move all 4 extremities, generalized weakness, no leg edema Neurological- alert and oriented to person, place and time Skin- warm and dry Psychiatry- normal mood and affect    Labs reviewed: Basic Metabolic Panel:  Recent Labs  60/45/4010/13/17 0445 03/29/16 0432  04/26/16 0401 04/27/16 0417 04/28/16 0445  NA 137 139  < > 131* 132* 131*  K 3.8 3.0*  < > 3.4* 4.2 4.1  CL 109 109  < > 102 104 100*  CO2 23 23  < > 24 25 25   GLUCOSE 209* 164*  < > 141* 130* 133*  BUN 27* 20  < > 15 14 19   CREATININE 0.55* 0.48*  < > 0.70 0.68 0.67  CALCIUM 7.8* 7.8*  < > 7.9* 8.1* 8.4*  MG 2.2 1.9  < > 1.6* 1.8 1.8  PHOS 1.6* 1.5*  --   --   --   --   < > =  values in this interval not displayed. Liver Function Tests:  Recent Labs  04/04/16 0400 04/22/16 1554 04/23/16 0507  AST 53* 26 21  ALT 58 34 27  ALKPHOS 155* 70 56  BILITOT 1.8* 2.1* 1.7*  PROT 5.6* 7.5 5.7*  ALBUMIN 2.4* 3.6 2.7*    Recent Labs  03/24/16 0416 03/25/16 0455 04/22/16 1554  LIPASE 165* 45 22  AMYLASE  --   --  30   No results for input(s): AMMONIA in the last 8760 hours. CBC:  Recent Labs  03/29/16 0432  04/01/16 1020  04/22/16 1554 04/23/16 0507 04/25/16 0305  WBC 16.8*  < > 19.0*  < > 30.6* 22.7* 16.6*  NEUTROABS 13.2*  --  16.1*  --  27.3*  --   --   HGB 12.6*  < > 11.9*  < > 15.3 12.8* 11.0*  HCT 36.8*  < > 34.1*  < > 44.3 38.4* 32.0*  MCV 94.8  < > 93.4  < > 96.5 97.0 93.8  PLT 165  < > 197  < > 266 211 161  < > = values in this interval not displayed. Cardiac Enzymes:  Recent Labs  04/23/16 1852 04/24/16 0305  TROPONINI 0.17* 0.09*   BNP: Invalid input(s): POCBNP CBG:  Recent Labs  04/28/16 0749 04/28/16 1209 04/28/16 1713  GLUCAP 123* 165* 137*    Radiological Exams: Ct Abdomen Pelvis Wo  Contrast  Result Date: 04/22/2016 CLINICAL DATA:  Leukocytosis, sepsis. History of recent pancreatitis. EXAM: CT ABDOMEN AND PELVIS WITHOUT CONTRAST TECHNIQUE: Multidetector CT imaging of the abdomen and pelvis was performed following the standard protocol without IV contrast. COMPARISON:  CT from 04/03/2016 FINDINGS: Lower chest: Bibasilar atelectasis. Stable scarring at the right base. No pneumonic consolidation or pneumothorax. Old right sixth and seventh rib fractures. Normal size cardiac chambers without significant pericardial effusion. Coronary arteriosclerosis is seen. Hepatobiliary: Hepatic steatosis. No space-occupying mass of the liver though assessment is limited by lack of IV contrast. Uncomplicated cholelithiasis. No biliary dilatation. Pancreas: The pancreas line is difficult to discretely identify due to the surrounding peripancreatic fatty inflammation and edema. This appears overall stable without apparent findings of pancreatic necrosis nor pseudocyst formation. Spleen: Normal in size without focal abnormality. Adrenals/Urinary Tract: Normal normal bilateral adrenal glands. Two punctate interpolar right renal calculi without obstructive uropathy. Apparent passage of the left UVJ stone since prior exam. No hydroureteronephrosis is noted. Stable bilateral perinephric fat stranding. Stomach/Bowel: Stomach is moderately distended with enteric contrast. No bowel obstruction or definite inflammation. Extensive colonic diverticulosis is noted along the colon, in particular involving the sigmoid colon. No findings of acute diverticulitis. No definite abscess. Colonic interposition of the liver seen. There is a normal-appearing appendix. Vascular/Lymphatic: Aortic and branch vessel atherosclerosis. No intraperitoneal, retroperitoneal, pelvic sidewall or inguinal lymphadenopathy. Reproductive: The prostate is enlarged with calcifications as before at the junction of the central and peripheral zone. Other:  No abdominal wall hernia or abnormality. No abdominopelvic ascites. Musculoskeletal: No worrisome lytic or sclerotic osseous lesions. IMPRESSION: 1. Stable sequela of pancreatitis without pancreatic necrosis, definite abscess on this unenhanced study nor pseudocyst formation. 2. Uncomplicated cholelithiasis. 3. Passage of left UVJ stone since prior exam. 4. Stable hepatic steatosis. 5. Abdominal aortic atherosclerosis. Electronically Signed   By: Tollie Eth M.D.   On: 04/22/2016 18:07   Dg Chest 1 View  Result Date: 04/26/2016 CLINICAL DATA:  Productive cough, diabetes, hypertension EXAM: CHEST 1 VIEW COMPARISON:  He 04/24/2016 FINDINGS: Right PICC line tip remains at the  SVC RA junction level. Low lung volumes persist with slight interstitial prominence as before. Postop changes in the right lung. No developing focal pneumonia, collapse or consolidation. Negative for effusion or pneumothorax. Trachea is midline. IMPRESSION: Stable low lung volumes and slight interstitial prominence, nonspecific. Stable postop changes in the right lung No significant interval change. Electronically Signed   By: Judie Petit.  Shick M.D.   On: 04/26/2016 09:16   Dg Chest 2 View  Result Date: 04/22/2016 CLINICAL DATA:  Strain fatigue and left G which began earlier today. History of diabetes and hypertension and unspecified lung surgery. Former smoker. EXAM: CHEST  2 VIEW COMPARISON:  Portable chest x-ray of March 28, 2016 FINDINGS: There is mild chronic volume loss on the right with elevation of the hemidiaphragm. The aerated portion of the right lung is clear. The left lung is clear. The heart and pulmonary vascularity are normal. There is old deformity of the posterior aspect of the right sixth and seventh ribs. There is degenerative change of the left shoulder. The thoracic spine exhibits mild multilevel degenerative disc space narrowing. IMPRESSION: Chronic volume loss on the right with mild elevation of the hemidiaphragm. No  evidence of pneumonia, CHF, nor other acute cardiopulmonary abnormality. Electronically Signed   By: David  [place] M.D.   On: 04/22/2016 15:39   Ct Abdomen Pelvis W Contrast  Result Date: 04/03/2016 CLINICAL DATA:  Pancreatitis. EXAM: CT ABDOMEN AND PELVIS WITH CONTRAST TECHNIQUE: Multidetector CT imaging of the abdomen and pelvis was performed using the standard protocol following bolus administration of intravenous contrast. CONTRAST:  ISOVUE-300 IOPAMIDOL (ISOVUE-300) INJECTION 61% COMPARISON:  03/26/2016 FINDINGS: Lower chest:  Stable areas of scarring right base. Hepatobiliary: The liver shows diffusely decreased attenuation suggesting steatosis. Gallstones again noted. No intrahepatic or extrahepatic biliary dilation. Pancreas: Interval slight progression of diffuse peripancreatic edema/inflammation. Pancreas enhances throughout with no overt findings of pancreatic necrosis. No evidence for organized pseudocyst or rim enhancing abscess. Spleen: No splenomegaly. No focal mass lesion. Adrenals/Urinary Tract: No adrenal nodule or mass. No evidence for enhancing lesion in either kidney. No evidence for hydroureter. Left UVJ stone is unchanged. The urinary bladder appears normal for the degree of distention. Stomach/Bowel: Stomach is distended with fluid. A feeding tube tip is positioned in the transverse duodenum. Duodenum is mildly distended. No small bowel wall thickening. No small bowel dilatation. The terminal ileum is normal. The appendix is normal. Diverticuli are seen scattered along the entire length of the colon without CT findings of diverticulitis. Vascular/Lymphatic: There is abdominal aortic atherosclerosis without aneurysm. As before, there is marked mass-effect/attenuation of the superior mesenteric vein just proximal to the porta splenic confluence. Mass-effect on the splenic vein has increased in the interval although the vessel does remain patent. No evidence for pseudoaneurysm. Celiac  axis and SMA opacified. There is no gastrohepatic or hepatoduodenal ligament lymphadenopathy. No intraperitoneal or retroperitoneal lymphadenopathy. No pelvic sidewall lymphadenopathy. Reproductive: Prostate gland is enlarged. Other: Trace intraperitoneal free fluid noted in the pelvis. Musculoskeletal: Bone windows reveal no worrisome lytic or sclerotic osseous lesions. IMPRESSION: 1. Slight interval progression of diffuse peripancreatic edema/inflammation. No evidence for pancreatic necrosis. No organized pseudocyst or rim enhancing fluid collection to suggest abscess. 2. Mass-effect on the superior mesenteric vein and splenic vein, slightly progressed in the interval although both vessels remain patent. 3. Interval decrease in intraperitoneal free fluid. 4. Cholelithiasis. 5. Stable appearance left UVJ stone without left hydroureteronephrosis. 6. Hepatic steatosis. 7. Interval resolution of left pleural effusion with persistent tiny right pleural  effusion. 8. Abdominal aortic atherosclerosis. Electronically Signed   By: Kennith Center M.D.   On: 04/03/2016 17:05   Dg Chest Port 1 View  Result Date: 04/24/2016 CLINICAL DATA:  Central line placement EXAM: PORTABLE CHEST 1 VIEW COMPARISON:  04/24/2016 400 hours FINDINGS: Right upper extremity PICC placed. Tip is at the cavoatrial junction. Low volumes. Stable ovoid density towards the right apex. Normal heart size. No pneumothorax. IMPRESSION: Right upper extremity PICC placed. Tip is at the cavoatrial junction. Electronically Signed   By: Jolaine Click M.D.   On: 04/24/2016 13:31   Dg Chest Port 1 View  Result Date: 04/24/2016 CLINICAL DATA:  Short of breath EXAM: PORTABLE CHEST 1 VIEW COMPARISON:  04/22/2016 FINDINGS: Normal heart size. Ovoid density at the right apex is stable. Lungs are very under aerated but otherwise grossly clear. Chronic right rib deformities. No pneumothorax. IMPRESSION: Stable examination.  Stable ovoid density at the right apex.  Electronically Signed   By: Jolaine Click M.D.   On: 04/24/2016 07:56    Assessment/Plan  Physical deconditioning Will have him work with physical therapy and occupational therapy team to help with gait training and muscle strengthening exercises.fall precautions. Skin care. Encourage to be out of bed.   E.coli pyelonephritis Afebrile. Has generalized weakness. Continue augmentin until 05/03/16. Monitor wbc and temp curve.  Nausea and vomiting Start zofran 4 mg q8h prn and monitor. Continue reglan.  Acute pancreatitis Had gallstone pancreatitis and required iv antibiotics. Denies abdominal pain but had bilious vomiting yesterday. Monitor for signs of acute abdomen. Plan is for him to undergo cholecystectomy post cardiac clearance. Continue pancreatic enzyme.   Narrow complex tachycardia Monitor bp, continue lorpessor 100 mg bid. Reviewed echocardiogram. Will make cardiology follow up.  AKI S/p iv fluids. Monitor bmp.  Leukocytosis Being treated for pyelonephritis. Monitor wbc and temp curve.   Protein calorie malnutrition RD consult, monitor weight. Encourage po intake.   Hyponatremia Monitor bmp  Anemia unspecified Monitor cbc  gerd Continue pepcid and monitor  DM type 2 Monitor cbg bid for now, continue metformin 500 mg daily and monitor  HTN Continue metoprolol and lisinopril, monitor bmp    Goals of care: short term rehabilitation   Labs/tests ordered: cbc, cmp 04/30/16 and 05/08/16  Family/ staff Communication: reviewed care plan with patient and nursing supervisor    Oneal Grout, MD Internal Medicine [facility] [address] [place], [place] [address] Cell Phone (Monday-Friday 8 am - 5 pm): [phone] On Call: [phone] and follow prompts after 5 pm and on weekends Office Phone: [phone] Office Fax: [phone]

## 2016-05-01 LAB — BASIC METABOLIC PANEL
BUN: 23 mg/dL — AB (ref 4–21)
Creatinine: 0.7 mg/dL (ref 0.6–1.3)
Glucose: 119 mg/dL
Potassium: 4 mmol/L (ref 3.4–5.3)
SODIUM: 136 mmol/L — AB (ref 137–147)

## 2016-05-01 LAB — CBC AND DIFFERENTIAL
HEMATOCRIT: 35 % — AB (ref 41–53)
HEMOGLOBIN: 11.9 g/dL — AB (ref 13.5–17.5)
PLATELETS: 200 10*3/uL (ref 150–399)
WBC: 19.9 10^3/mL

## 2016-05-01 LAB — HEPATIC FUNCTION PANEL
ALK PHOS: 86 U/L (ref 25–125)
ALT: 20 U/L (ref 10–40)
AST: 22 U/L (ref 14–40)
BILIRUBIN, TOTAL: 1.1 mg/dL

## 2016-05-02 ENCOUNTER — Inpatient Hospital Stay (HOSPITAL_COMMUNITY)
Admission: EM | Admit: 2016-05-02 | Discharge: 2016-05-21 | DRG: 438 | Disposition: A | Discharge status: Other Acute Inpatient Hospital | Payer: Medicare Other | Attending: Internal Medicine | Admitting: Internal Medicine

## 2016-05-02 ENCOUNTER — Emergency Department (HOSPITAL_COMMUNITY): Payer: Medicare Other

## 2016-05-02 ENCOUNTER — Encounter (HOSPITAL_COMMUNITY): Payer: Self-pay | Admitting: Emergency Medicine

## 2016-05-02 ENCOUNTER — Non-Acute Institutional Stay (SKILLED_NURSING_FACILITY): Payer: Medicare Other | Admitting: Family

## 2016-05-02 DIAGNOSIS — K567 Ileus, unspecified: Secondary | ICD-10-CM | POA: Diagnosis not present

## 2016-05-02 DIAGNOSIS — Z01818 Encounter for other preprocedural examination: Secondary | ICD-10-CM

## 2016-05-02 DIAGNOSIS — I10 Essential (primary) hypertension: Secondary | ICD-10-CM | POA: Diagnosis not present

## 2016-05-02 DIAGNOSIS — E119 Type 2 diabetes mellitus without complications: Secondary | ICD-10-CM | POA: Diagnosis present

## 2016-05-02 DIAGNOSIS — D72829 Elevated white blood cell count, unspecified: Secondary | ICD-10-CM | POA: Diagnosis present

## 2016-05-02 DIAGNOSIS — G9341 Metabolic encephalopathy: Secondary | ICD-10-CM | POA: Diagnosis not present

## 2016-05-02 DIAGNOSIS — Z01810 Encounter for preprocedural cardiovascular examination: Secondary | ICD-10-CM | POA: Diagnosis not present

## 2016-05-02 DIAGNOSIS — R600 Localized edema: Secondary | ICD-10-CM

## 2016-05-02 DIAGNOSIS — Y95 Nosocomial condition: Secondary | ICD-10-CM | POA: Diagnosis present

## 2016-05-02 DIAGNOSIS — Z7984 Long term (current) use of oral hypoglycemic drugs: Secondary | ICD-10-CM

## 2016-05-02 DIAGNOSIS — R066 Hiccough: Secondary | ICD-10-CM | POA: Diagnosis present

## 2016-05-02 DIAGNOSIS — B356 Tinea cruris: Secondary | ICD-10-CM | POA: Diagnosis present

## 2016-05-02 DIAGNOSIS — I110 Hypertensive heart disease with heart failure: Secondary | ICD-10-CM | POA: Diagnosis present

## 2016-05-02 DIAGNOSIS — Z789 Other specified health status: Secondary | ICD-10-CM | POA: Diagnosis not present

## 2016-05-02 DIAGNOSIS — J189 Pneumonia, unspecified organism: Secondary | ICD-10-CM | POA: Diagnosis not present

## 2016-05-02 DIAGNOSIS — K863 Pseudocyst of pancreas: Secondary | ICD-10-CM | POA: Diagnosis present

## 2016-05-02 DIAGNOSIS — E440 Moderate protein-calorie malnutrition: Secondary | ICD-10-CM | POA: Diagnosis present

## 2016-05-02 DIAGNOSIS — B9620 Unspecified Escherichia coli [E. coli] as the cause of diseases classified elsewhere: Secondary | ICD-10-CM | POA: Diagnosis present

## 2016-05-02 DIAGNOSIS — E785 Hyperlipidemia, unspecified: Secondary | ICD-10-CM | POA: Diagnosis present

## 2016-05-02 DIAGNOSIS — R059 Cough, unspecified: Secondary | ICD-10-CM

## 2016-05-02 DIAGNOSIS — R5383 Other fatigue: Secondary | ICD-10-CM | POA: Diagnosis not present

## 2016-05-02 DIAGNOSIS — I420 Dilated cardiomyopathy: Secondary | ICD-10-CM | POA: Diagnosis present

## 2016-05-02 DIAGNOSIS — Z87891 Personal history of nicotine dependence: Secondary | ICD-10-CM

## 2016-05-02 DIAGNOSIS — E43 Unspecified severe protein-calorie malnutrition: Secondary | ICD-10-CM | POA: Diagnosis present

## 2016-05-02 DIAGNOSIS — Z87442 Personal history of urinary calculi: Secondary | ICD-10-CM | POA: Diagnosis not present

## 2016-05-02 DIAGNOSIS — I5023 Acute on chronic systolic (congestive) heart failure: Secondary | ICD-10-CM | POA: Diagnosis not present

## 2016-05-02 DIAGNOSIS — Z6823 Body mass index (BMI) 23.0-23.9, adult: Secondary | ICD-10-CM

## 2016-05-02 DIAGNOSIS — Z8719 Personal history of other diseases of the digestive system: Secondary | ICD-10-CM

## 2016-05-02 DIAGNOSIS — Z79899 Other long term (current) drug therapy: Secondary | ICD-10-CM | POA: Diagnosis not present

## 2016-05-02 DIAGNOSIS — R05 Cough: Secondary | ICD-10-CM

## 2016-05-02 DIAGNOSIS — I480 Paroxysmal atrial fibrillation: Secondary | ICD-10-CM | POA: Diagnosis not present

## 2016-05-02 DIAGNOSIS — L89152 Pressure ulcer of sacral region, stage 2: Secondary | ICD-10-CM | POA: Diagnosis present

## 2016-05-02 DIAGNOSIS — Z8249 Family history of ischemic heart disease and other diseases of the circulatory system: Secondary | ICD-10-CM

## 2016-05-02 DIAGNOSIS — K8512 Biliary acute pancreatitis with infected necrosis: Secondary | ICD-10-CM | POA: Diagnosis present

## 2016-05-02 DIAGNOSIS — B3749 Other urogenital candidiasis: Secondary | ICD-10-CM

## 2016-05-02 DIAGNOSIS — K5900 Constipation, unspecified: Secondary | ICD-10-CM

## 2016-05-02 DIAGNOSIS — T85598A Other mechanical complication of other gastrointestinal prosthetic devices, implants and grafts, initial encounter: Secondary | ICD-10-CM

## 2016-05-02 DIAGNOSIS — R0600 Dyspnea, unspecified: Secondary | ICD-10-CM

## 2016-05-02 DIAGNOSIS — K8020 Calculus of gallbladder without cholecystitis without obstruction: Secondary | ICD-10-CM | POA: Diagnosis present

## 2016-05-02 DIAGNOSIS — K859 Acute pancreatitis without necrosis or infection, unspecified: Secondary | ICD-10-CM | POA: Diagnosis present

## 2016-05-02 DIAGNOSIS — Z0189 Encounter for other specified special examinations: Secondary | ICD-10-CM

## 2016-05-02 DIAGNOSIS — Z4659 Encounter for fitting and adjustment of other gastrointestinal appliance and device: Secondary | ICD-10-CM

## 2016-05-02 LAB — I-STAT CG4 LACTIC ACID, ED
Lactic Acid, Venous: 2.28 mmol/L (ref 0.5–1.9)
Lactic Acid, Venous: 1.86 mmol/L (ref 0.5–1.9)

## 2016-05-02 LAB — URINE MICROSCOPIC-ADD ON

## 2016-05-02 LAB — CBC WITH DIFFERENTIAL/PLATELET
Basophils Absolute: 0 10*3/uL (ref 0.0–0.1)
Basophils Relative: 0 %
Eosinophils Absolute: 0.3 10*3/uL (ref 0.0–0.7)
Eosinophils Relative: 1 %
HCT: 38.6 % — ABNORMAL LOW (ref 39.0–52.0)
Hemoglobin: 12.9 g/dL — ABNORMAL LOW (ref 13.0–17.0)
Lymphocytes Relative: 6 %
Lymphs Abs: 1.6 10*3/uL (ref 0.7–4.0)
MCH: 32.2 pg (ref 26.0–34.0)
MCHC: 33.4 g/dL (ref 30.0–36.0)
MCV: 96.3 fL (ref 78.0–100.0)
Monocytes Absolute: 1.8 10*3/uL — ABNORMAL HIGH (ref 0.1–1.0)
Monocytes Relative: 7 %
Neutro Abs: 22.3 10*3/uL — ABNORMAL HIGH (ref 1.7–7.7)
Neutrophils Relative %: 86 %
Platelets: 231 10*3/uL (ref 150–400)
RBC: 4.01 MIL/uL — ABNORMAL LOW (ref 4.22–5.81)
RDW: 14 % (ref 11.5–15.5)
WBC: 26 10*3/uL — ABNORMAL HIGH (ref 4.0–10.5)

## 2016-05-02 LAB — COMPREHENSIVE METABOLIC PANEL
ALT: 25 U/L (ref 17–63)
AST: 27 U/L (ref 15–41)
Albumin: 2.3 g/dL — ABNORMAL LOW (ref 3.5–5.0)
Alkaline Phosphatase: 88 U/L (ref 38–126)
Anion gap: 9 (ref 5–15)
BUN: 23 mg/dL — ABNORMAL HIGH (ref 6–20)
CO2: 23 mmol/L (ref 22–32)
Calcium: 8.9 mg/dL (ref 8.9–10.3)
Chloride: 101 mmol/L (ref 101–111)
Creatinine, Ser: 0.91 mg/dL (ref 0.61–1.24)
GFR calc Af Amer: >60 mL/min (ref >60)
GFR calc non Af Amer: >60 mL/min (ref >60)
Glucose, Bld: 174 mg/dL — ABNORMAL HIGH (ref 65–99)
Potassium: 4.3 mmol/L (ref 3.5–5.1)
Sodium: 133 mmol/L — ABNORMAL LOW (ref 135–145)
Total Bilirubin: 1.5 mg/dL — ABNORMAL HIGH (ref 0.3–1.2)
Total Protein: 5.9 g/dL — ABNORMAL LOW (ref 6.5–8.1)

## 2016-05-02 LAB — MRSA PCR SCREENING: MRSA by PCR: NEGATIVE

## 2016-05-02 LAB — URINALYSIS, ROUTINE W REFLEX MICROSCOPIC
Glucose, UA: NEGATIVE mg/dL
Hgb urine dipstick: NEGATIVE
Ketones, ur: NEGATIVE mg/dL
Nitrite: NEGATIVE
Protein, ur: NEGATIVE mg/dL
Specific Gravity, Urine: 1.024 (ref 1.005–1.030)
pH: 6 (ref 5.0–8.0)

## 2016-05-02 LAB — GLUCOSE, CAPILLARY
Glucose-Capillary: 114 mg/dL — ABNORMAL HIGH (ref 65–99)
Glucose-Capillary: 112 mg/dL — ABNORMAL HIGH (ref 65–99)

## 2016-05-02 LAB — TROPONIN I: TROPONIN I: 0.03 ng/mL — AB (ref <0.03)

## 2016-05-02 LAB — LIPASE, BLOOD: Lipase: 18 U/L (ref 11–51)

## 2016-05-02 LAB — PREALBUMIN: Prealbumin: <5 mg/dL — ABNORMAL LOW (ref 18–38)

## 2016-05-02 MED ORDER — ONDANSETRON HCL 4 MG PO TABS: 4.0000 mg | ORAL_TABLET | Freq: Four times a day (QID) | ORAL | Status: DC | PRN

## 2016-05-02 MED ORDER — ATORVASTATIN CALCIUM 10 MG PO TABS
10.0000 mg | ORAL_TABLET | Freq: Every day | ORAL | Status: DC
  Administered 2016-05-02 – 2016-05-20 (×18): 10 mg via ORAL
  Filled 2016-05-02 (×19): qty 1

## 2016-05-02 MED ORDER — IOPAMIDOL (ISOVUE-300) INJECTION 61%
INTRAVENOUS | Status: AC
  Administered 2016-05-02: 75 mL via INTRAVENOUS
  Filled 2016-05-02: qty 100

## 2016-05-02 MED ORDER — SODIUM CHLORIDE 0.9 % IV BOLUS (SEPSIS)
1000.0000 mL | Freq: Once | INTRAVENOUS | Status: AC
  Administered 2016-05-02: 1000 mL via INTRAVENOUS

## 2016-05-02 MED ORDER — ACETAMINOPHEN 650 MG RE SUPP
650.0000 mg | Freq: Four times a day (QID) | RECTAL | Status: DC | PRN
  Administered 2016-05-09: 650 mg via RECTAL
  Filled 2016-05-02: qty 1

## 2016-05-02 MED ORDER — SODIUM CHLORIDE 0.9 % IV SOLN
INTRAVENOUS | Status: AC
  Administered 2016-05-02 – 2016-05-04 (×4): via INTRAVENOUS

## 2016-05-02 MED ORDER — PANCRELIPASE (LIP-PROT-AMYL) 36000-114000 UNITS PO CPEP
36000.0000 [IU] | ORAL_CAPSULE | Freq: Three times a day (TID) | ORAL | Status: DC
  Administered 2016-05-03 – 2016-05-13 (×19): 36000 [IU] via ORAL
  Filled 2016-05-02 (×38): qty 1

## 2016-05-02 MED ORDER — INSULIN ASPART 100 UNIT/ML ~~LOC~~ SOLN
0.0000 [IU] | Freq: Three times a day (TID) | SUBCUTANEOUS | Status: DC
  Administered 2016-05-05: 1 [IU] via SUBCUTANEOUS

## 2016-05-02 MED ORDER — PIPERACILLIN-TAZOBACTAM 3.375 G IVPB
3.3750 g | Freq: Three times a day (TID) | INTRAVENOUS | Status: DC
  Administered 2016-05-03: 3.375 g via INTRAVENOUS
  Filled 2016-05-02 (×4): qty 50

## 2016-05-02 MED ORDER — BISACODYL 10 MG RE SUPP
10.0000 mg | Freq: Every day | RECTAL | Status: DC | PRN
  Administered 2016-05-15: 10 mg via RECTAL
  Filled 2016-05-02 (×2): qty 1

## 2016-05-02 MED ORDER — INSULIN ASPART 100 UNIT/ML ~~LOC~~ SOLN: 0.0000 [IU] | Freq: Three times a day (TID) | SUBCUTANEOUS | Status: DC

## 2016-05-02 MED ORDER — SENNOSIDES-DOCUSATE SODIUM 8.6-50 MG PO TABS
1.0000 | ORAL_TABLET | Freq: Every evening | ORAL | Status: DC | PRN
  Administered 2016-05-03 – 2016-05-14 (×2): 1 via ORAL
  Filled 2016-05-02 (×2): qty 1

## 2016-05-02 MED ORDER — ACETAMINOPHEN 325 MG PO TABS
650.0000 mg | ORAL_TABLET | Freq: Four times a day (QID) | ORAL | Status: DC | PRN
  Administered 2016-05-02 – 2016-05-21 (×4): 650 mg via ORAL
  Filled 2016-05-02 (×4): qty 2

## 2016-05-02 MED ORDER — SODIUM CHLORIDE 0.9% FLUSH
3.0000 mL | Freq: Two times a day (BID) | INTRAVENOUS | Status: DC
  Administered 2016-05-02 – 2016-05-20 (×16): 3 mL via INTRAVENOUS

## 2016-05-02 MED ORDER — LISINOPRIL 2.5 MG PO TABS
2.5000 mg | ORAL_TABLET | Freq: Every day | ORAL | Status: DC
  Administered 2016-05-03 – 2016-05-04 (×2): 2.5 mg via ORAL
  Filled 2016-05-02 (×2): qty 1

## 2016-05-02 MED ORDER — LISINOPRIL 2.5 MG PO TABS: 2.5000 mg | ORAL_TABLET | Freq: Every day | ORAL | Status: DC

## 2016-05-02 MED ORDER — FAMOTIDINE IN NACL 20-0.9 MG/50ML-% IV SOLN
20.0000 mg | Freq: Two times a day (BID) | INTRAVENOUS | Status: DC
  Administered 2016-05-02 – 2016-05-04 (×5): 20 mg via INTRAVENOUS
  Filled 2016-05-02 (×4): qty 50

## 2016-05-02 MED ORDER — FLUCONAZOLE 100 MG PO TABS
100.0000 mg | ORAL_TABLET | Freq: Every day | ORAL | Status: DC
  Administered 2016-05-02 – 2016-05-05 (×4): 100 mg via ORAL
  Filled 2016-05-02 (×4): qty 1

## 2016-05-02 MED ORDER — PIPERACILLIN-TAZOBACTAM 3.375 G IVPB 30 MIN
3.3750 g | Freq: Once | INTRAVENOUS | Status: AC
  Administered 2016-05-02: 3.375 g via INTRAVENOUS
  Filled 2016-05-02: qty 50

## 2016-05-02 MED ORDER — MAGNESIUM CITRATE PO SOLN
1.0000 | Freq: Once | ORAL | Status: DC | PRN
  Filled 2016-05-02: qty 296

## 2016-05-02 MED ORDER — FAMOTIDINE 20 MG PO TABS: 20.0000 mg | ORAL_TABLET | Freq: Two times a day (BID) | ORAL | Status: DC

## 2016-05-02 MED ORDER — ONDANSETRON HCL 4 MG/2ML IJ SOLN
4.0000 mg | Freq: Four times a day (QID) | INTRAMUSCULAR | Status: DC | PRN
  Administered 2016-05-04 – 2016-05-16 (×8): 4 mg via INTRAVENOUS
  Filled 2016-05-02 (×8): qty 2

## 2016-05-02 MED ORDER — METOPROLOL TARTRATE 100 MG PO TABS
100.0000 mg | ORAL_TABLET | Freq: Two times a day (BID) | ORAL | Status: DC
  Administered 2016-05-03 – 2016-05-20 (×36): 100 mg via ORAL
  Filled 2016-05-02 (×39): qty 1

## 2016-05-02 MED ORDER — ATORVASTATIN CALCIUM 10 MG PO TABS: 10.0000 mg | ORAL_TABLET | Freq: Every day | ORAL | Status: DC

## 2016-05-02 MED ORDER — METOCLOPRAMIDE HCL 10 MG PO TABS
5.0000 mg | ORAL_TABLET | Freq: Three times a day (TID) | ORAL | Status: DC | PRN
  Administered 2016-05-05 – 2016-05-18 (×5): 10 mg via ORAL
  Filled 2016-05-02 (×5): qty 1

## 2016-05-02 NOTE — ED Notes (Signed)
Pt returned from CT °

## 2016-05-02 NOTE — Consult Note (Signed)
Reason for Consult:  Pancreatitis  Referring Physician: Dr. Eugenio Hoes  Colin West is an 82 y.o. male.  HPI: Pt returns to ED with fatigue and generalized weakness. Productive cough but no SOB.  Pt weakness increasing in SNF, with new bedsores buttocks and heels. He has only been up walking once since d/c 04/28/16.  He was hospitalized 04/22/16 - 04/28/16.with pyleonephritis and sepsis.  Before that he was hospitalized from 10/8-10/23/17 with acute hemorrhagic pancreatitis precipitated by gallstones.  Work up in the ED shows temperature up to 100.1.  Na 133, bilirubin 1.5, WBC 26K.  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 Decrease in pancreatic gland enhancement consistent with probable areas of necrosis since the initial study. Patent splenic and portal veins and SMA. Splenic vein is slightly narrowed along its midportion from the phlegmonous mass.  We are ask to see     Past Medical History:  Diagnosis Date  . Anxiety   . Carotid artery stenosis    a. < 40% bilateral stenosis by [place] in 2014  . Depression    denies  . Diabetes mellitus    denies  . History of kidney stones   . Hyperlipidemia   . Hypertension     Past Surgical History:  Procedure Laterality Date  . ELBOW ARTHROSCOPY Right   . INGUINAL HERNIA REPAIR Left Sept 2006   Dr Georgette Dover  . INGUINAL HERNIA REPAIR Bilateral 06/01/2013   Procedure: LAPAROSCOPIC EXPLORATION BILATERAL INGUINAL HERNIA REPAIR;  Surgeon: Adin Hector, MD;  Location: [place];  Service: General;  Laterality: Bilateral;  Left Femoral, Left Recurrent inguinal, Right Inguinal  . INSERTION OF MESH Bilateral 06/01/2013   Procedure: INSERTION OF MESH;  Surgeon: Adin Hector, MD;  Location: [place];  Service: General;  Laterality: Bilateral;  Left Femoral, Left Recurrent inguinal, Right Inguinal  . LAPAROSCOPIC LYSIS OF ADHESIONS Bilateral  06/01/2013   Procedure: LAPAROSCOPIC LYSIS OF ADHESIONS;  Surgeon: Adin Hector, MD;  Location: [place];  Service: General;  Laterality: Bilateral;  . LUNG REMOVAL, PARTIAL  Sept. 2011    Dr. Arlyce Dice  . ROTATOR CUFF REPAIR     Bilateral repair    Family History  Problem Relation Age of Onset  . Heart disease Brother     Heart Disease before age 2  . Heart attack Brother     Social History:  reports that he quit smoking about 42 years ago. He has never used smokeless tobacco. He reports that he does not drink alcohol or use drugs.  Allergies: No Known Allergies  Medications:  Prior to Admission:  (Not in a hospital admission) Scheduled:  Continuous: . piperacillin-tazobactam     PRN: Anti-infectives    Start     Dose/Rate Route Frequency Ordered Stop   05/02/16 1600  piperacillin-tazobactam (ZOSYN) IVPB 3.375 g     3.375 g 100 mL/hr over 30 Minutes Intravenous  Once 05/02/16 1548        Results for orders placed or performed during the hospital encounter of 05/02/16 (from the past 48 hour(s))  Comprehensive metabolic panel     Status: Abnormal   Collection Time: 05/02/16 11:46 AM  Result Value Ref Range   Sodium 133 (L) 135 - 145 mmol/L   Potassium 4.3 3.5 - 5.1 mmol/L   Chloride 101 101 - 111 mmol/L   CO2 23 22 - 32 mmol/L   Glucose, Bld 174 (H) 65 - 99  mg/dL   BUN 23 (H) 6 - 20 mg/dL   Creatinine, Ser 0.91 0.61 - 1.24 mg/dL   Calcium 8.9 8.9 - 10.3 mg/dL   Total Protein 5.9 (L) 6.5 - 8.1 g/dL   Albumin 2.3 (L) 3.5 - 5.0 g/dL   AST 27 15 - 41 U/L   ALT 25 17 - 63 U/L   Alkaline Phosphatase 88 38 - 126 U/L   Total Bilirubin 1.5 (H) 0.3 - 1.2 mg/dL   GFR calc non Af Amer >60 >60 mL/min   GFR calc Af Amer >60 >60 mL/min    Comment: (NOTE) The eGFR has been calculated using the CKD EPI equation. This calculation has not been validated in all clinical situations. eGFR's persistently <60 mL/min signify possible Chronic Kidney Disease.    Anion gap 9 5 - 15   CBC with Differential     Status: Abnormal   Collection Time: 05/02/16 11:46 AM  Result Value Ref Range   WBC 26.0 (H) 4.0 - 10.5 K/uL   RBC 4.01 (L) 4.22 - 5.81 MIL/uL   Hemoglobin 12.9 (L) 13.0 - 17.0 g/dL   HCT 38.6 (L) 39.0 - 52.0 %   MCV 96.3 78.0 - 100.0 fL   MCH 32.2 26.0 - 34.0 pg   MCHC 33.4 30.0 - 36.0 g/dL   RDW 14.0 11.5 - 15.5 %   Platelets 231 150 - 400 K/uL   Neutrophils Relative % 86 %   Lymphocytes Relative 6 %   Monocytes Relative 7 %   Eosinophils Relative 1 %   Basophils Relative 0 %   Neutro Abs 22.3 (H) 1.7 - 7.7 K/uL   Lymphs Abs 1.6 0.7 - 4.0 K/uL   Monocytes Absolute 1.8 (H) 0.1 - 1.0 K/uL   Eosinophils Absolute 0.3 0.0 - 0.7 K/uL   Basophils Absolute 0.0 0.0 - 0.1 K/uL   WBC Morphology TOXIC GRANULATION   Urinalysis, Routine w reflex microscopic     Status: Abnormal   Collection Time: 05/02/16 11:49 AM  Result Value Ref Range   Color, Urine AMBER (A) YELLOW    Comment: BIOCHEMICALS MAY BE AFFECTED BY COLOR   APPearance CLOUDY (A) CLEAR   Specific Gravity, Urine 1.024 1.005 - 1.030   pH 6.0 5.0 - 8.0   Glucose, UA NEGATIVE NEGATIVE mg/dL   Hgb urine dipstick NEGATIVE NEGATIVE   Bilirubin Urine SMALL (A) NEGATIVE   Ketones, ur NEGATIVE NEGATIVE mg/dL   Protein, ur NEGATIVE NEGATIVE mg/dL   Nitrite NEGATIVE NEGATIVE   Leukocytes, UA SMALL (A) NEGATIVE  Urine microscopic-add on     Status: Abnormal   Collection Time: 05/02/16 11:49 AM  Result Value Ref Range   Squamous Epithelial / LPF 0-5 (A) NONE SEEN   WBC, UA 6-30 0 - 5 WBC/hpf   RBC / HPF 0-5 0 - 5 RBC/hpf   Bacteria, UA RARE (A) NONE SEEN   Urine-Other MUCOUS PRESENT   I-Stat CG4 Lactic Acid, ED     Status: Abnormal   Collection Time: 05/02/16 12:24 PM  Result Value Ref Range   Lactic Acid, Venous 2.28 (HH) 0.5 - 1.9 mmol/L   Comment NOTIFIED PHYSICIAN   I-Stat CG4 Lactic Acid, ED     Status: None   Collection Time: 05/02/16  3:12 PM  Result Value Ref Range   Lactic Acid, Venous 1.86  0.5 - 1.9 mmol/L    Dg Chest 2 View  Result Date: 05/02/2016 CLINICAL DATA:  Weakness and fever EXAM: CHEST  2  VIEW COMPARISON:  04/26/2016; 03/28/2016; 05/31/2013 ; CT abdomen pelvis - 04/22/2016 FINDINGS: Grossly unchanged cardiac silhouette and mediastinal contours with atherosclerotic plaque within thoracic aorta. Lung volumes were reduced. Stable postsurgical change of the right upper lung with volume loss and elevation involving the anterior aspect the right hemidiaphragm. Note is again made of a interposed loop of hepatic flexure of the colon below the right hemidiaphragm as seen on prior abdominal CT. Grossly unchanged mild diffuse slightly nodular thickening of the pulmonary interstitium. No discrete focal airspace opacities. No pleural effusion or pneumothorax. No evidence of edema. No acute osseus abnormalities. Post right rotator cuff repair. Degenerative change within the lower thoracic and upper lumbar spine. IMPRESSION: 1. Similar findings of decreased lung volumes without acute cardiopulmonary disease. No new focal airspace opacities to suggest pneumonia. 2. Stable postsurgical change of the right upper lung with associated volume loss. Electronically Signed   By: Sandi Mariscal M.D.   On: 05/02/2016 13:27   Ct Abdomen Pelvis W Contrast  Result Date: 05/02/2016 CLINICAL DATA:  Hemorrhagic pancreatitis with periumbilical pain. EXAM: CT ABDOMEN AND PELVIS WITH CONTRAST TECHNIQUE: Multidetector CT imaging of the abdomen and pelvis was performed using the standard protocol following bolus administration of intravenous contrast. CONTRAST:  75 cc of Isovue-300 IV COMPARISON:  04/22/2016 FINDINGS: Lower chest: There is bibasilar dependent atelectasis. Three-vessel coronary arteriosclerosis. No pericardial effusion. Heart size is within normal to the extent visible. There is aortic atherosclerosis of the descending aorta. There is a small hiatal hernia. Hepatobiliary: No space-occupying mass or  abscess of the liver. Slight surface nodularity of the liver consistent cirrhosis. Gallstones are seen within a physiologically distended gallbladder. There is colonic interposition large bowel between the liver and ventral abdominal wall. Pancreas: There are multifocal abscesses within the phlegmonous inflammatory soft tissue mass outlining the pancreas which have developed since the interim. There is an approximately 5.8 x 3.5 x 5.6 cm abscess immediately anterior to the third portion of the duodenum interposed between the duodenum and SMA, series 2, image 46, series 5, image 52. There is an approximately 5.9 x 4.2 x 4.2 cm right upper quadrant abscess just anterior to the junction of the second and third portion of the duodenum, series 2 image 40 all, series 5, image 44. There is an approximately 6.1 x 3.9 x 3.3 cm abscess with air just anterior to the distal third portion of the duodenum and overlying the SMA, series 2 image 35 and series 5 image 44. Smaller foci of air seen within the phlegmonous mass further cephalad anterior to the pancreatic head and midbody, series 2, image 30. Faint enhancement of the atretic pancreatic gland is seen within the phlegmonous mass. There appears to have been some necrosis of portions of the pancreatic body and head since initial study from 04/03/2016. The portal and splenic veins remain patent with slight narrowing or mass-effect on the mid to distal splenic vein, series 2, image 32 . Spleen: Normal in size without focal abnormality. Adrenals/Urinary Tract: Stable appearance of the adrenal glands and kidneys without obstructive uropathy. Bladder is partially distended. Stomach/Bowel: There is mild sympathetic thickening of small intestine in the region of the pancreas. No bowel obstruction is noted. Vascular/Lymphatic: Aortoiliac and branch vessel atherosclerosis. The celiac vessels, SMA and IMA appear patent. Reproductive: Prostate is mildly enlarged up to 5.3 cm with  peripheral zone calcifications. Other: Small amount of fluid is seen in the pelvis with trace edema along the pericolic gutters and adjacent to the liver. Musculoskeletal:  Thoracolumbar degenerative disc disease consistent with spondylosis. No acute osseous abnormality. 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 Decrease in pancreatic gland enhancement consistent with probable areas of necrosis since the initial study. Patent splenic and portal veins and SMA. Splenic vein is slightly narrowed along its midportion from the phlegmonous mass. Critical Value/emergent results were called by telephone at the time of interpretation on 05/02/2016 at 3:22 pm to Dr. Virgel Manifold , who verbally acknowledged these results. Electronically Signed   By: Ashley Royalty M.D.   On: 05/02/2016 15:22    Review of Systems  Constitutional: Positive for fever and weight loss (20 lbs since 03/23/16).  HENT: Negative.   Eyes: Negative.   Respiratory: Negative.   Cardiovascular: Negative.   Gastrointestinal: Positive for nausea and vomiting. Negative for abdominal pain, blood in stool, constipation, diarrhea and heartburn.  Genitourinary: Negative.   Musculoskeletal: Positive for back pain.  Skin:       Skin break down buttocks, he has walked x 1 since discharge 04/28/16 at SNF.  Neurological: Negative.   Endo/Heme/Allergies: Negative for environmental allergies and polydipsia. Does not bruise/bleed easily.  Psychiatric/Behavioral: Negative.    Blood pressure 113/62, pulse 80, temperature 100.1 F (37.8 C), temperature source Rectal, resp. rate 21, SpO2 96 %. Physical Exam  Constitutional: He is oriented to person, place, and time. No distress.  Elderly male, in no acute distress, but appears chronically ill.  His family notes he was independent and active till his admit 03/23/16.  HENT:  Head: Normocephalic and  atraumatic.  Nose: Nose normal.  Eyes: Right eye exhibits no discharge. Left eye exhibits no discharge. No scleral icterus.  Neck: Normal range of motion. Neck supple. No JVD present. No tracheal deviation present. No thyromegaly present.  Marked wasting upper chest and neck  Cardiovascular: Normal rate, regular rhythm, normal heart sounds and intact distal pulses.   No murmur heard. Respiratory: Effort normal. No respiratory distress. He has no wheezes. He has no rales. He exhibits no tenderness.  GI: Soft. Bowel sounds are normal. He exhibits no distension and no mass. There is no tenderness. There is no rebound and no guarding.  Musculoskeletal: He exhibits no edema or tenderness.  Lymphadenopathy:    He has no cervical adenopathy.  Neurological: He is alert and oriented to person, place, and time. No cranial nerve deficit.  Skin: Skin is warm and dry. No rash noted. He is not diaphoretic. No erythema. No pallor.  Psychiatric: He has a normal mood and affect. His behavior is normal. Judgment and thought content normal.    Assessment/Plan: Infected pancreatitis vs developing pancreatic abscess Hx of gallstone pancreatitis with hemorrhage Recent sepsis with pyelonephritis  Hx of Narrow complex tachycardia and cardiomyopathy Hypertension  AODM  Plan:  Medicine is admitting and we recommend step down unit for monitoring him for sepsis.  Cardiology consult, he needs fluid resuscitation in the presence of cardiomyopathy.  Fluid hydration, NPO, except ice chips and sips.  Zosyn and Vancomycin for antibiotics.  We will ask IR to see and see if they can get some fluid from the site and culture it for [place].   Recommend PICC, he will most likely need TNA.  Recheck labs in AM.  We will follow with you.   Graciemae Delisle 05/02/2016, 4:18 PM

## 2016-05-02 NOTE — ED Notes (Signed)
Pt in CT.

## 2016-05-02 NOTE — Progress Notes (Addendum)
ANTIBIOTIC CONSULT NOTE - INITIAL  Pharmacy Consult for Zosyn/Vanco Indication: Intraabdominal infection  No Known Allergies  Patient Measurements:   Adjusted Body Weight:    Vital Signs: Temp: 100.1 F (37.8 C) (11/17 1338) Temp Source: Rectal (11/17 1338) BP: 125/65 (11/17 1634) Pulse Rate: 86 (11/17 1634) Intake/Output from previous day: No intake/output data recorded. Intake/Output from this shift: Total I/O In: 1000 [IV Piggyback:1000] Out: 50 [Urine:50]  Labs:  Recent Labs  05/01/16 05/02/16 1146  WBC 19.9 26.0*  HGB 11.9* 12.9*  PLT 200 231  CREATININE 0.7 0.91   Estimated Creatinine Clearance: 64.7 mL/min (by C-G formula based on SCr of 0.91 mg/dL). No results for input(s): VANCOTROUGH, VANCOPEAK, VANCORANDOM, GENTTROUGH, GENTPEAK, GENTRANDOM, TOBRATROUGH, TOBRAPEAK, TOBRARND, AMIKACINPEAK, AMIKACINTROU, AMIKACIN in the last 72 hours.   Microbiology:   Medical History: Past Medical History:  Diagnosis Date  . Anxiety   . Carotid artery stenosis    a. < 40% bilateral stenosis by [place] in 2014  . Depression    denies  . Diabetes mellitus    denies  . History of kidney stones   . Hyperlipidemia   . Hypertension     Assessment: 82 y/[facility] with increased lethargy.  Recent hospital admission from 04/22/2016-04/28/2016 due to sepsis from E.coli Pyelonephritis. He is currently on Augmentin twice daily.He was to undergo cholecystectomy upon recovery from infection.   WBC 26. Scr 0.91, CrCl 64, Temp 100.1  Goal of Therapy:  Eradication of infection  Plan:  Zosyn 3.375g IV q8hr. Dose ok for CrCl>20. Vancomycin 1g IV q12h. Vanco trough after 3-5 doses at steady state.  Viraaj Vorndran S. Merilynn Finlandobertson, PharmD, BCPS Clinical Staff Pharmacist Pager [phone]  Misty Stanleyobertson, Miria Cappelli Stillinger 05/02/2016,4:47 PM

## 2016-05-02 NOTE — ED Triage Notes (Signed)
Per [facility], pt was at [facility], getting ready to move to [facility]. Pt has hx of sepsis, [facility]. Pt is AAOX4, VSS. C/o headache, 99.3 Temp. Started showing signs of sepsis so they sent him back here for reeval.

## 2016-05-02 NOTE — H&P (Signed)
History and Physical    Colin West WUJ:[id number] DOB: 1933-12-01 DOA: 05/02/2016   PCP: Verl BangsADIONTCHENKO, ALEXEI, MD   Patient coming from:  Home  Chief Complaint: Generalized Weakness   HPI: Colin HasGraham N Fero is a 82 y.o. male  with complex history recently hospitalized from 10/8-10/12 for hemorrhagic pancreatitis from gallblader source, and on  11/7-11/13 for Severe Sepsis due to E.Coli pyelonephritis, discharged to  [facility] on oral antibiotics, brought to the emergency department with worsening generalized weakness, today  evere enough with  associated nausea and vomiting. He denies shortness of breath or productive cough. He has been unable to ambulate due to weakness. He denies any abdominal pain, he has occasional nausea without vomiting, he denies any diarrhea or constipation. He has not been able to eat a normal meal, lost about 15 lbs in the last 2 months.  He reports frequent hiccups. He denies any lower extremity swelling. Due to prolonged bed rest, he has developed some the ulcers in the sacrum and gluteal  areas as well as some minimal heel blanching hese is being cared at the facility as well. No bleeding issues are reported such as hemoptysis, cemetery messages, hematuria, or hematochezia  ED Course:  BP 125/65   Pulse 86   Temp 100.1 F (37.8 C) (Rectal)   Resp 26   SpO2 95%    sodium 133 potassium 4.3 creatinine 0.91 glucose 174 with a gap 9 alkaline phosphatase 88, stable from prior albumin 2.3 AST 27 ALT 25, similar to prior in November 16,  total bilirubin 1.5  lactic acid initially 2.28, now 1.86.  White count 26 hemoglobin 12.9 platelets 231  CT abdomen and pelvis shows Development of multifocal abscesses within the phlegmonousretroperitoneal inflammatory mass surrounding an atretic pancreatic gland.and  Probable areas of necrosis since prior CXR without acute findings. No PNA    Review of Systems: As per HPI otherwise 10 point review of systems negative.    Past Medical History:  Diagnosis Date  . Anxiety   . Carotid artery stenosis    a. < 40% bilateral stenosis by US in 2014  . Depression    denies  . Diabetes mellitus    denies  . History of kidney stones   . Hyperlipidemia   . Hypertension     Past Surgical History:  Procedure Laterality Date  . ELBOW ARTHROSCOPY Right   . INGUINAL HERNIA REPAIR Left Sept 2006   Dr Corliss Skainssuei  . INGUINAL HERNIA REPAIR Bilateral 06/01/2013   Procedure: LAPAROSCOPIC EXPLORATION BILATERAL INGUINAL HERNIA REPAIR;  Surgeon: Ardeth SportsmanSteven C. Gross, MD;  Location: [facility] OR;  Service: General;  Laterality: Bilateral;  Left Femoral, Left Recurrent inguinal, Right Inguinal  . INSERTION OF MESH Bilateral 06/01/2013   Procedure: INSERTION OF MESH;  Surgeon: Ardeth SportsmanSteven C. Gross, MD;  Location: [facility] OR;  Service: General;  Laterality: Bilateral;  Left Femoral, Left Recurrent inguinal, Right Inguinal  . LAPAROSCOPIC LYSIS OF ADHESIONS Bilateral 06/01/2013   Procedure: LAPAROSCOPIC LYSIS OF ADHESIONS;  Surgeon: Ardeth SportsmanSteven C. Gross, MD;  Location: [facility] OR;  Service: General;  Laterality: Bilateral;  . LUNG REMOVAL, PARTIAL  Sept. 2011    Dr. Edwyna ShellBurney  . ROTATOR CUFF REPAIR     Bilateral repair    Social History Social History   Social History  . Marital status: Divorced    Spouse name: N/A  . Number of children: N/A  . Years of education: N/A   Occupational History  . Not on file.   Social History  Main Topics  . Smoking status: Former Smoker    Quit date: 05/31/1973  . Smokeless tobacco: Never Used  . Alcohol use No  . Drug use: No  . Sexual activity: Not on file   Other Topics Concern  . Not on file   Social History Narrative  . No narrative on file     No Known Allergies  Family History  Problem Relation Age of Onset  . Heart disease Brother     Heart Disease before age 19  . Heart attack Brother       Prior to Admission medications   Medication Sig Start Date End Date Taking? Authorizing Provider   amoxicillin-clavulanate (AUGMENTIN) 875-125 MG tablet Take 1 tablet by mouth every 12 (twelve) hours. 04/28/16  Yes Marinda Elk, MD  atorvastatin (LIPITOR) 10 MG tablet Take 10 mg by mouth daily.   Yes Historical Provider, MD  famotidine (PEPCID) 20 MG tablet Take 1 tablet (20 mg total) by mouth 2 (two) times daily. 04/07/16  Yes Osvaldo Shipper, MD  feeding supplement, GLUCERNA SHAKE, (GLUCERNA SHAKE) LIQD Take 237 mLs by mouth 3 (three) times daily between meals. 04/07/16  Yes Osvaldo Shipper, MD  lipase/protease/amylase (CREON) 36000 UNITS CPEP capsule Take 1 capsule (36,000 Units total) by mouth 3 (three) times daily before meals. 04/07/16  Yes Osvaldo Shipper, MD  lisinopril (PRINIVIL,ZESTRIL) 2.5 MG tablet Take 1 tablet (2.5 mg total) by mouth daily. 04/29/16  Yes Marinda Elk, MD  metFORMIN (GLUCOPHAGE-XR) 500 MG 24 hr tablet Take 500 mg by mouth daily with breakfast.   Yes Historical Provider, MD  metoCLOPramide (REGLAN) 5 MG tablet Take 5-10 mg by mouth every 8 (eight) hours as needed (for hiccups).   Yes Historical Provider, MD  metoprolol (LOPRESSOR) 100 MG tablet Take 1 tablet (100 mg total) by mouth 2 (two) times daily. 04/28/16  Yes Marinda Elk, MD  ondansetron (ZOFRAN) 4 MG tablet Take 4 mg by mouth every 8 (eight) hours as needed for nausea or vomiting.   Yes Historical Provider, MD  potassium chloride SA (K-DUR,KLOR-CON) 20 MEQ tablet Take 1 tablet (20 mEq total) by mouth daily. 04/07/16  Yes Osvaldo Shipper, MD  traMADol (ULTRAM) 50 MG tablet Take 1 tablet (50 mg total) by mouth every 6 (six) hours as needed for moderate pain. 04/28/16  Yes Marinda Elk, MD    Physical Exam:    Vitals:   05/02/16 1415 05/02/16 1458 05/02/16 1530 05/02/16 1634  BP: 126/62 116/63 113/62 125/65  Pulse: 81 80 80 86  Resp: 23 21 21 26   Temp:      TempSrc:      SpO2: 96% 95% 96% 95%       Constitutional: NAD, ill appearing, lethargic, cahectic  Vitals:    05/02/16 1415 05/02/16 1458 05/02/16 1530 05/02/16 1634  BP: 126/62 116/63 113/62 125/65  Pulse: 81 80 80 86  Resp: 23 21 21 26   Temp:      TempSrc:      SpO2: 96% 95% 96% 95%   Eyes: PERRL, lids and conjunctivae normal ENMT: Mucous membranes are dry  Posterior pharynx clear of any exudate or lesions.Normal dentition.  Neck: normal, supple, no masses, no thyromegaly Respiratory: clear to auscultation bilaterally, no wheezing, no crackles. Normal respiratory effort. No accessory muscle use.  Cardiovascular: Regular rate and rhythm, no murmurs / rubs / gallops. No extremity edema. 2+ pedal pulses. No carotid bruits.  Abdomen: no tenderness, no masses palpated. No hepatosplenomegaly. Bowel sounds  positive.  Musculoskeletal: no clubbing / cyanosis. No joint deformity upper and lower extremities. Good ROM, no contractures. Normal muscle tone.  Skin: known  ulcers in the sacrum and gluteal  areas as well as some minimal heel blanching Neurologic: CN 2-12 grossly intact. Sensation intact, DTR normal. Strength 5/5 in all 4. Very lethargic due to generalized weakness    Labs on Admission: I have personally reviewed following labs and imaging studies  CBC:  Recent Labs Lab 05/01/16 05/02/16 1146  WBC 19.9 26.0*  NEUTROABS  --  22.3*  HGB 11.9* 12.9*  HCT 35* 38.6*  MCV  --  96.3  PLT 200 231    Basic Metabolic Panel:  Recent Labs Lab 04/26/16 0401 04/27/16 0417 04/28/16 0445 05/01/16 05/02/16 1146  NA 131* 132* 131* 136* 133*  K 3.4* 4.2 4.1 4.0 4.3  CL 102 104 100*  --  101  CO2 24 25 25   --  23  GLUCOSE 141* 130* 133*  --  174*  BUN 15 14 19  23* 23*  CREATININE 0.70 0.68 0.67 0.7 0.91  CALCIUM 7.9* 8.1* 8.4*  --  8.9  MG 1.6* 1.8 1.8  --   --     GFR: Estimated Creatinine Clearance: 64.7 mL/min (by C-G formula based on SCr of 0.91 mg/dL).  Liver Function Tests:  Recent Labs Lab 05/01/16 05/02/16 1146  AST 22 27  ALT 20 25  ALKPHOS 86 88  BILITOT  --  1.5*    PROT  --  5.9*  ALBUMIN  --  2.3*   No results for input(s): LIPASE, AMYLASE in the last 168 hours. No results for input(s): AMMONIA in the last 168 hours.  Coagulation Profile: No results for input(s): INR, PROTIME in the last 168 hours.  Cardiac Enzymes: No results for input(s): CKTOTAL, CKMB, CKMBINDEX, TROPONINI in the last 168 hours.  BNP (last 3 results) No results for input(s): PROBNP in the last 8760 hours.  HbA1C: No results for input(s): HGBA1C in the last 72 hours.  CBG:  Recent Labs Lab 04/28/16 0001 04/28/16 0425 04/28/16 0749 04/28/16 1209 04/28/16 1713  GLUCAP 152* 128* 123* 165* 137*    Lipid Profile: No results for input(s): CHOL, HDL, LDLCALC, TRIG, CHOLHDL, LDLDIRECT in the last 72 hours.  Thyroid Function Tests: No results for input(s): TSH, T4TOTAL, FREET4, T3FREE, THYROIDAB in the last 72 hours.  Anemia Panel: No results for input(s): VITAMINB12, FOLATE, FERRITIN, TIBC, IRON, RETICCTPCT in the last 72 hours.  Urine analysis:    Component Value Date/Time   COLORURINE AMBER (A) 05/02/2016 1149   APPEARANCEUR CLOUDY (A) 05/02/2016 1149   LABSPEC 1.024 05/02/2016 1149   PHURINE 6.0 05/02/2016 1149   GLUCOSEU NEGATIVE 05/02/2016 1149   HGBUR NEGATIVE 05/02/2016 1149   BILIRUBINUR SMALL (A) 05/02/2016 1149   KETONESUR NEGATIVE 05/02/2016 1149   PROTEINUR NEGATIVE 05/02/2016 1149   UROBILINOGEN 0.2 03/08/2010 1026   NITRITE NEGATIVE 05/02/2016 1149   LEUKOCYTESUR SMALL (A) 05/02/2016 1149    Sepsis Labs: @LABRCNTIP (procalcitonin:4,lacticidven:4) ) Recent Results (from the past 240 hour(s))  MRSA PCR Screening     Status: None   Collection Time: 04/23/16 10:42 AM  Result Value Ref Range Status   MRSA by PCR NEGATIVE NEGATIVE Final    Comment:        The GeneXpert MRSA Assay (FDA approved for NASAL specimens only), is one component of a comprehensive MRSA colonization surveillance program. It is not intended to diagnose  MRSA infection nor to guide or  monitor treatment for MRSA infections.      Radiological Exams on Admission: Dg Chest 2 View  Result Date: 05/02/2016 CLINICAL DATA:  Weakness and fever EXAM: CHEST  2 VIEW COMPARISON:  04/26/2016; 03/28/2016; 05/31/2013 ; CT abdomen pelvis - 04/22/2016 FINDINGS: Grossly unchanged cardiac silhouette and mediastinal contours with atherosclerotic plaque within thoracic aorta. Lung volumes were reduced. Stable postsurgical change of the right upper lung with volume loss and elevation involving the anterior aspect the right hemidiaphragm. Note is again made of a interposed loop of hepatic flexure of the colon below the right hemidiaphragm as seen on prior abdominal CT. Grossly unchanged mild diffuse slightly nodular thickening of the pulmonary interstitium. No discrete focal airspace opacities. No pleural effusion or pneumothorax. No evidence of edema. No acute osseus abnormalities. Post right rotator cuff repair. Degenerative change within the lower thoracic and upper lumbar spine. IMPRESSION: 1. Similar findings of decreased lung volumes without acute cardiopulmonary disease. No new focal airspace opacities to suggest pneumonia. 2. Stable postsurgical change of the right upper lung with associated volume loss. Electronically Signed   By: Simonne Come M.D.   On: 05/02/2016 13:27   Ct Abdomen Pelvis W Contrast  Result Date: 05/02/2016 CLINICAL DATA:  Hemorrhagic pancreatitis with periumbilical pain. EXAM: CT ABDOMEN AND PELVIS WITH CONTRAST TECHNIQUE: Multidetector CT imaging of the abdomen and pelvis was performed using the standard protocol following bolus administration of intravenous contrast. CONTRAST:  75 cc of Isovue-300 IV COMPARISON:  04/22/2016 FINDINGS: Lower chest: There is bibasilar dependent atelectasis. Three-vessel coronary arteriosclerosis. No pericardial effusion. Heart size is within normal to the extent visible. There is aortic atherosclerosis of the  descending aorta. There is a small hiatal hernia. Hepatobiliary: No space-occupying mass or abscess of the liver. Slight surface nodularity of the liver consistent cirrhosis. Gallstones are seen within a physiologically distended gallbladder. There is colonic interposition large bowel between the liver and ventral abdominal wall. Pancreas: There are multifocal abscesses within the phlegmonous inflammatory soft tissue mass outlining the pancreas which have developed since the interim. There is an approximately 5.8 x 3.5 x 5.6 cm abscess immediately anterior to the third portion of the duodenum interposed between the duodenum and SMA, series 2, image 46, series 5, image 52. There is an approximately 5.9 x 4.2 x 4.2 cm right upper quadrant abscess just anterior to the junction of the second and third portion of the duodenum, series 2 image 40 all, series 5, image 44. There is an approximately 6.1 x 3.9 x 3.3 cm abscess with air just anterior to the distal third portion of the duodenum and overlying the SMA, series 2 image 35 and series 5 image 44. Smaller foci of air seen within the phlegmonous mass further cephalad anterior to the pancreatic head and midbody, series 2, image 30. Faint enhancement of the atretic pancreatic gland is seen within the phlegmonous mass. There appears to have been some necrosis of portions of the pancreatic body and head since initial study from 04/03/2016. The portal and splenic veins remain patent with slight narrowing or mass-effect on the mid to distal splenic vein, series 2, image 32 . Spleen: Normal in size without focal abnormality. Adrenals/Urinary Tract: Stable appearance of the adrenal glands and kidneys without obstructive uropathy. Bladder is partially distended. Stomach/Bowel: There is mild sympathetic thickening of small intestine in the region of the pancreas. No bowel obstruction is noted. Vascular/Lymphatic: Aortoiliac and branch vessel atherosclerosis. The celiac vessels,  SMA and IMA appear patent. Reproductive: Prostate is mildly  enlarged up to 5.3 cm with peripheral zone calcifications. Other: Small amount of fluid is seen in the pelvis with trace edema along the pericolic gutters and adjacent to the liver. Musculoskeletal: Thoracolumbar degenerative disc disease consistent with spondylosis. No acute osseous abnormality. 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 Decrease in pancreatic gland enhancement consistent with probable areas of necrosis since the initial study. Patent splenic and portal veins and SMA. Splenic vein is slightly narrowed along its midportion from the phlegmonous mass. Critical Value/emergent results were called by telephone at the time of interpretation on 05/02/2016 at 3:22 pm to Dr. Raeford RazorSTEPHEN KOHUT , who verbally acknowledged these results. Electronically Signed   By: Tollie Ethavid  Kwon M.D.   On: 05/02/2016 15:22    EKG: Independently reviewed.  Assessment/Plan Active Problems:   Essential hypertension   Hyperbilirubinemia   Malnutrition of moderate degree   Leukocytosis   History of acute pancreatitis   Congestive dilated cardiomyopathy (HCC)   Pancreatic abscess    Multiple Pancreatic Abscesses. recently hospitalized from 10/8-10/12 for hemorrhagic pancreatitis from gallbladder source, and on  11/7-11/13 for Severe Sepsis due to E.Coli pyelonephritis, discharged to  [facility]  On Augmentin on d/c on 11/13   Appreciate Surgery to consult for recommendations regarding drainage by Surg vs. IR.  Leuk 26, LA 1.82,  Tmax 100.1. No tachycardia or tachypnnea, BP OK . No confusion noted . Received Zosyn IVx1 and IV bolus 1 L  Admit to Stepdown  IVF at 125 cc NS /h  CBC and CMET in am NPO  Except sips with meds  IV antibiotics with Vanc and Zosyn per Pharmacy   Malnutrition  IV access for possible PICC line as patient my need TNA  As he has los  20 lbs in the last 2 months, albumin 2.3     Type II Diabetes Current blood sugar level is 174 Lab Results  Component Value Date   HGBA1C 5.9 (H) 03/23/2016  Hold home oral diabetic medications.  SSI    Hypertension BP 125/65   Pulse 86   Controlled Continue home anti-hypertensive medications    Hiccups Continue Reglan   Hyperlipidemia Continue home statins   Dilated Cardiomyopathy in view of recent sepsis. 2 D echo recently shows EF 30-35 % with Gr 1 DD Cards to see, appreciate involvement   DVT prophylaxis: SCD's  Code Status:   Full     Family Communication:  Discussed with patient Disposition Plan: Expect patient to be discharged to home after condition improves Consults called:    Surgery and Cardiology  Admission status:   Inpatient Telemetry   [facility] CenterWERTMAN,Costas Sena E, PA-C [facility]   05/02/2016, 4:37 PM

## 2016-05-02 NOTE — ED Notes (Signed)
Pt to xray

## 2016-05-02 NOTE — ED Notes (Signed)
Patient is resting comfortably. 

## 2016-05-02 NOTE — Progress Notes (Signed)
Location:  [facility] Room Number: 501 Place of Service:  SNF (31) Provider: Karey Stucki FNP-C   [name], MD  Patient Care Team: [name], MD as PCP - General (Family Medicine) Bjorn Pippin, MD as Consulting Physician (Urology)  Extended Emergency Contact Information Primary Emergency Contact: Marny Lowenstein [place] Home Phone: [phone] Relation: Daughter Secondary Emergency Contact: Juanetta Snow          [place], [place] Home Phone: [phone] Relation: Brother  Code Status: Full code  Goals of care: Advanced Directive information Advanced Directives 04/22/2016  Does patient have an advance directive? No  Type of Advance Directive -  Does patient want to make changes to advanced directive? -  Copy of advanced directive(s) in chart? -     Chief Complaint  Patient presents with  . Acute Visit    Lethargy     HPI:  Pt is a 82 y.o. male seen today at [facility]  for an acute visit for evaluation of increased lethargy.He has a medical history of HTN, CHF, Type 2 DM, CAD among other conditions. He is seen in his room today with daughter at bedside. Facility Nurse reports change in patient's condition. Patient states not feeling well. He denies any fever or chills though patient's daughter states he has been keeping his room hot. She also reports patient coughing up yellow sputum. Of note he is post hospital admission from 04/22/2016-04/28/2016 due to sepsis from E.coli Pyelonephritis. He is currently on Augmentin twice daily.He was to undergo cholecystectomy upon recovery from infection. His recent WBC 19.9, TP 4.9, ALb 2.34 ( 05/01/2016) previous WBC 16.6.    Past Medical History:  Diagnosis Date  . Anxiety   . Carotid artery stenosis    a. < 40% bilateral stenosis by [place] in 2014  . Depression    denies  . Diabetes mellitus    denies  . History of  kidney stones   . Hyperlipidemia   . Hypertension    Past Surgical History:  Procedure Laterality Date  . ELBOW ARTHROSCOPY Right   . INGUINAL HERNIA REPAIR Left Sept 2006   Dr Corliss Skains  . INGUINAL HERNIA REPAIR Bilateral 06/01/2013   Procedure: LAPAROSCOPIC EXPLORATION BILATERAL INGUINAL HERNIA REPAIR;  Surgeon: Ardeth Sportsman, MD;  Location: [facility] OR;  Service: General;  Laterality: Bilateral;  Left Femoral, Left Recurrent inguinal, Right Inguinal  . INSERTION OF MESH Bilateral 06/01/2013   Procedure: INSERTION OF MESH;  Surgeon: Ardeth Sportsman, MD;  Location: [facility] OR;  Service: General;  Laterality: Bilateral;  Left Femoral, Left Recurrent inguinal, Right Inguinal  . LAPAROSCOPIC LYSIS OF ADHESIONS Bilateral 06/01/2013   Procedure: LAPAROSCOPIC LYSIS OF ADHESIONS;  Surgeon: Ardeth Sportsman, MD;  Location: [facility] OR;  Service: General;  Laterality: Bilateral;  . LUNG REMOVAL, PARTIAL  Sept. 2011    Dr. Edwyna Shell  . ROTATOR CUFF REPAIR     Bilateral repair    No Known Allergies    Medication List       Accurate as of 05/02/16 11:26 AM. Always use your most recent med list.          amoxicillin-clavulanate 875-125 MG tablet Commonly known as:  AUGMENTIN Take 1 tablet by mouth every 12 (twelve) hours.   famotidine 20 MG tablet Commonly known as:  PEPCID Take 1 tablet (20 mg total) by mouth 2 (two) times daily.   feeding supplement (GLUCERNA SHAKE) Liqd Take 237  mLs by mouth 3 (three) times daily between meals.   lipase/protease/amylase [id number] UNITS Cpep capsule Commonly known as:  CREON Take 1 capsule (36,000 Units total) by mouth 3 (three) times daily before meals.   lisinopril 2.5 MG tablet Commonly known as:  PRINIVIL,ZESTRIL Take 1 tablet (2.5 mg total) by mouth daily.   lovastatin 20 MG tablet Commonly known as:  MEVACOR Take 20 mg by mouth daily.   metFORMIN 500 MG 24 hr tablet Commonly known as:  GLUCOPHAGE-XR Take 500 mg by mouth daily with breakfast.     metoCLOPramide 5 MG tablet Commonly known as:  REGLAN Take 5-10 mg by mouth every 8 (eight) hours as needed (for hiccups).   metoprolol 100 MG tablet Commonly known as:  LOPRESSOR Take 1 tablet (100 mg total) by mouth 2 (two) times daily.   potassium chloride SA 20 MEQ tablet Commonly known as:  K-DUR,KLOR-CON Take 1 tablet (20 mEq total) by mouth daily.   traMADol 50 MG tablet Commonly known as:  ULTRAM Take 1 tablet (50 mg total) by mouth every 6 (six) hours as needed for moderate pain.       Review of Systems  Constitutional: Positive for fatigue. Negative for activity change, appetite change, chills and fever.  HENT: Negative for congestion, rhinorrhea, sinus pressure, sneezing and sore throat.   Eyes: Negative.   Respiratory: Positive for cough. Negative for chest tightness, shortness of breath and wheezing.   Gastrointestinal: Negative for abdominal distention, abdominal pain, constipation, diarrhea, nausea and vomiting.  Endocrine: Negative.   Genitourinary: Negative for dysuria, frequency and urgency.  Musculoskeletal: Positive for gait problem.  Skin: Negative for color change, pallor and rash.  Neurological: Negative for dizziness, seizures, syncope, light-headedness and headaches.  Hematological: Does not bruise/bleed easily.  Psychiatric/Behavioral: Negative for agitation, confusion, hallucinations and sleep disturbance.     There is no immunization history on file for this patient. Pertinent  Health Maintenance Due  Topic Date Due  . PNA vac Low Risk Adult (1 of 2 - PCV13) 01/05/2001  . INFLUENZA VACCINE  01/15/2016      Vitals:   05/02/16 1049  BP: 140/73  Pulse: 87  Resp: 18  Temp: 97.5 F (36.4 C)  SpO2: 98%  Weight: 162 lb (73.5 kg)  Height: 5\' 9"  (1.753 m)   Body mass index is 23.92 kg/m. Physical Exam  Constitutional: He is oriented to person, place, and time.  Thin Elderly lethargic in no acute distress.   HENT:  Head: Normocephalic.   Mouth/Throat: No oropharyngeal exudate.  Eyes: Conjunctivae and EOM are normal. Pupils are equal, round, and reactive to light. Right eye exhibits no discharge. Left eye exhibits no discharge. No scleral icterus.  Neck: Normal range of motion. No JVD present. No thyromegaly present.  Cardiovascular: Normal rate, regular rhythm, normal heart sounds and intact distal pulses.  Exam reveals no gallop and no friction rub.   No murmur heard. Pulmonary/Chest: Effort normal and breath sounds normal. No respiratory distress. He has no wheezes. He has no rales.  Abdominal: Soft. Bowel sounds are normal. He exhibits no distension. There is no tenderness. There is no rebound and no guarding.  Musculoskeletal: He exhibits no tenderness or deformity.  Moves x 4 extremities. 1-2 + pitting edema to Lower extremities. Provolone boots in place.   Lymphadenopathy:    He has no cervical adenopathy.  Neurological: He is oriented to person, place, and time.  Lethargic  Skin: Skin is warm and dry. No rash noted. No  erythema.  Skin red, peeling to sacral and gluteal areas. No drainage noted.   Psychiatric: He has a normal mood and affect.    Labs reviewed:  Recent Labs  03/28/16 0445 03/29/16 0432  04/26/16 0401 04/27/16 0417 04/28/16 0445 05/01/16  NA 137 139  < > 131* 132* 131* 136*  K 3.8 3.0*  < > 3.4* 4.2 4.1 4.0  CL 109 109  < > 102 104 100*  --   CO2 23 23  < > 24 25 25   --   GLUCOSE 209* 164*  < > 141* 130* 133*  --   BUN 27* 20  < > 15 14 19  23*  CREATININE 0.55* 0.48*  < > 0.70 0.68 0.67 0.7  CALCIUM 7.8* 7.8*  < > 7.9* 8.1* 8.4*  --   MG 2.2 1.9  < > 1.6* 1.8 1.8  --   PHOS 1.6* 1.5*  --   --   --   --   --   < > = values in this interval not displayed.  Recent Labs  04/04/16 0400 04/22/16 1554 04/23/16 0507 05/01/16  AST 53* 26 21 22   ALT 58 34 27 20  ALKPHOS 155* 70 56 86  BILITOT 1.8* 2.1* 1.7*  --   PROT 5.6* 7.5 5.7*  --   ALBUMIN 2.4* 3.6 2.7*  --     Recent Labs   03/29/16 0432  04/01/16 1020  04/22/16 1554 04/23/16 0507 04/25/16 0305 05/01/16  WBC 16.8*  < > 19.0*  < > 30.6* 22.7* 16.6* 19.9  NEUTROABS 13.2*  --  16.1*  --  27.3*  --   --   --   HGB 12.6*  < > 11.9*  < > 15.3 12.8* 11.0* 11.9*  HCT 36.8*  < > 34.1*  < > 44.3 38.4* 32.0* 35*  MCV 94.8  < > 93.4  < > 96.5 97.0 93.8  --   PLT 165  < > 197  < > 266 211 161 200  < > = values in this interval not displayed. No results found for: TSH Lab Results  Component Value Date   HGBA1C 5.9 (H) 03/23/2016   Lab Results  Component Value Date   TRIG 179 (H) 03/28/2016   Assessment/Plan 1. Lethargy Worsening. Status post hospital admission due to sepsis completed IV antibiotics blood cultures were negative; Switched to oral antibiotics. currently on Augmentin. Planned for outpatient cholecystectomy once recovered from  Infections. Will send to ER for evaluation possible sepsis.   2. Leukocytosis, unspecified type Afebrile. WBC Trending up WBC 19.9 ( 05/01/2016) previous WBC 16.6. Send to ER for Evaluation for sepsis.   3. Edema  Bilateral lower extremities 1-2 + edema. Might benefit from low dose diuretic given hx of CHF. Will refer to ER for now.   4. Protein Malnutrition  TP 4.9, ALb 2.34 ( 05/01/2016) continue with Protein supplements. RD to follow up.   5. Candidiasis  Bilateral gluteal redness and skin peeling. Recommended Nystatin to peri area twice daily.   Family/ staff Communication: Reviewed plan of care with patient, Patient's daughter and facility Nurse supervisor. Refer to ER for evaluation of increased lethargy and Leukocytosis   Labs/tests ordered:  None

## 2016-05-02 NOTE — ED Notes (Signed)
Surgery at bedside.

## 2016-05-02 NOTE — ED Provider Notes (Signed)
[facility]-EMERGENCY DEPT Provider Note   CSN: [id number] Arrival date & time: 05/02/16  1132   By signing my name below, I, Freida Busmaniana Omoyeni, attest that this documentation has been prepared under the direction and in the presence of Raeford RazorStephen Rahshawn Remo, MD . Electronically Signed: Freida Busmaniana Omoyeni, Scribe. 05/02/2016. 12:22 PM.   History   Chief Complaint Chief Complaint  Patient presents with  . Weakness    The history is provided by the patient, a relative and medical records. No language interpreter was used.     HPI Comments:  Colin West is a 82 y.o. male who presents to the Emergency Department for fatigue and generalized weakness x ~ 6 weeks.  Pt states he feels awful. He reports associated nausea and vomiting. He also notes occasional productive cough but denies SOB. Pt was seen in the ED on 05/02/16 for generalized weakness and was admitted for sepsis. Patient was also admitted on 03/23/16 for hemorrhagic pancreatitis which was thought to be precipitated by gallstones. Daughter states pt is in need of a cholecystectomy but has not been well enough to have the surgery. Following his 05/02/16 admission, pt was discharged to [facility] 5 days ago. Daughter states she has noticed an increase in fatigue and continued decrease in activity due to weakness. She also notes bedsores to the buttocks and acute wounds to his heels; states she does not believe pt is being ambulated or moved adequately at the rehab facility. She states 2 months ago pt had a full time job, lived alone, and was completely independent.     Past Medical History:  Diagnosis Date  . Anxiety   . Carotid artery stenosis    a. < 40% bilateral stenosis by US in 2014  . Depression    denies  . Diabetes mellitus    denies  . History of kidney stones   . Hyperlipidemia   . Hypertension     Patient Active Problem List   Diagnosis Date Noted  . SVT (supraventricular tachycardia) (HCC)   . Congestive  dilated cardiomyopathy (HCC)   . SOB (shortness of breath)   . Elevated troponin   . Sepsis (HCC) 04/22/2016  . Leukocytosis 04/22/2016  . Hypotension 04/22/2016  . Sinus tachycardia 04/22/2016  . History of acute pancreatitis 04/22/2016  . SIRS (systemic inflammatory response syndrome) (HCC) 04/22/2016  . Cholelithiasis   . Malnutrition of moderate degree 03/28/2016  . Hyperbilirubinemia 03/26/2016  . Abdominal pain 03/23/2016  . Acute pancreatitis 03/23/2016  . Femoral hernia - left - s/p lap repair 06/01/2013 06/01/2013  . Bilateral inguinal hernia (BIH) s/p lap repair 06/01/2013 05/30/2013  . Occlusion and stenosis of carotid artery without mention of cerebral infarction 04/01/2012  . Hypertrophy of prostate with urinary obstruction and other lower urinary tract symptoms (LUTS) 05/30/2010  . CRYPTOCOCCOSIS 03/27/2010  . Essential hypertension 03/27/2010  . HYDROCELE 03/27/2010    Past Surgical History:  Procedure Laterality Date  . ELBOW ARTHROSCOPY Right   . INGUINAL HERNIA REPAIR Left Sept 2006   Dr Corliss Skainssuei  . INGUINAL HERNIA REPAIR Bilateral 06/01/2013   Procedure: LAPAROSCOPIC EXPLORATION BILATERAL INGUINAL HERNIA REPAIR;  Surgeon: Ardeth SportsmanSteven C. Gross, MD;  Location: [facility] OR;  Service: General;  Laterality: Bilateral;  Left Femoral, Left Recurrent inguinal, Right Inguinal  . INSERTION OF MESH Bilateral 06/01/2013   Procedure: INSERTION OF MESH;  Surgeon: Ardeth SportsmanSteven C. Gross, MD;  Location: [facility] OR;  Service: General;  Laterality: Bilateral;  Left Femoral, Left Recurrent inguinal, Right Inguinal  .  LAPAROSCOPIC LYSIS OF ADHESIONS Bilateral 06/01/2013   Procedure: LAPAROSCOPIC LYSIS OF ADHESIONS;  Surgeon: Ardeth Sportsman, MD;  Location: [facility] OR;  Service: General;  Laterality: Bilateral;  . LUNG REMOVAL, PARTIAL  Sept. 2011    Dr. Edwyna Shell  . ROTATOR CUFF REPAIR     Bilateral repair       Home Medications    Prior to Admission medications   Medication Sig Start Date End Date  Taking? Authorizing Provider  amoxicillin-clavulanate (AUGMENTIN) 875-125 MG tablet Take 1 tablet by mouth every 12 (twelve) hours. 04/28/16  Yes Marinda Elk, MD  atorvastatin (LIPITOR) 10 MG tablet Take 10 mg by mouth daily.   Yes Historical Provider, MD  famotidine (PEPCID) 20 MG tablet Take 1 tablet (20 mg total) by mouth 2 (two) times daily. 04/07/16  Yes Osvaldo Shipper, MD  feeding supplement, GLUCERNA SHAKE, (GLUCERNA SHAKE) LIQD Take 237 mLs by mouth 3 (three) times daily between meals. 04/07/16  Yes Osvaldo Shipper, MD  lipase/protease/amylase (CREON) 36000 UNITS CPEP capsule Take 1 capsule (36,000 Units total) by mouth 3 (three) times daily before meals. 04/07/16  Yes Osvaldo Shipper, MD  lisinopril (PRINIVIL,ZESTRIL) 2.5 MG tablet Take 1 tablet (2.5 mg total) by mouth daily. 04/29/16  Yes Marinda Elk, MD  metFORMIN (GLUCOPHAGE-XR) 500 MG 24 hr tablet Take 500 mg by mouth daily with breakfast.   Yes Historical Provider, MD  metoCLOPramide (REGLAN) 5 MG tablet Take 5-10 mg by mouth every 8 (eight) hours as needed (for hiccups).   Yes Historical Provider, MD  metoprolol (LOPRESSOR) 100 MG tablet Take 1 tablet (100 mg total) by mouth 2 (two) times daily. 04/28/16  Yes Marinda Elk, MD  ondansetron (ZOFRAN) 4 MG tablet Take 4 mg by mouth every 8 (eight) hours as needed for nausea or vomiting.   Yes Historical Provider, MD  potassium chloride SA (K-DUR,KLOR-CON) 20 MEQ tablet Take 1 tablet (20 mEq total) by mouth daily. 04/07/16  Yes Osvaldo Shipper, MD  traMADol (ULTRAM) 50 MG tablet Take 1 tablet (50 mg total) by mouth every 6 (six) hours as needed for moderate pain. 04/28/16  Yes Marinda Elk, MD    Family History Family History  Problem Relation Age of Onset  . Heart disease Brother     Heart Disease before age 49  . Heart attack Brother     Social History Social History  Substance Use Topics  . Smoking status: Former Smoker    Quit date: 05/31/1973  .  Smokeless tobacco: Never Used  . Alcohol use No     Allergies   Patient has no known allergies.   Review of Systems Review of Systems  Gastrointestinal: Positive for nausea and vomiting.  Skin: Positive for wound.  Neurological: Positive for weakness.  All other systems reviewed and are negative.   Physical Exam Updated Vital Signs BP 102/66   Pulse 81   Temp 98.7 F (37.1 C)   Resp 19   SpO2 93%   Physical Exam  Constitutional: He is oriented to person, place, and time. He appears well-developed and well-nourished.  Drowsy but opens eyes to voice Frequent hiccups  HENT:  Head: Normocephalic and atraumatic.  Eyes: EOM are normal.  Neck: Normal range of motion.  Cardiovascular: Normal rate, regular rhythm, normal heart sounds and intact distal pulses.   Pulmonary/Chest: Effort normal and breath sounds normal. No respiratory distress.  Abdominal: Soft. He exhibits no distension. There is no tenderness.  Musculoskeletal: Normal range of motion.  Neurological: He is alert and oriented to person, place, and time.  Skin: Skin is warm and dry.  Stage 2 decubitus ulcer to lower sacrum and buttocks  Blanching to bilateral heels without obvious skin breakdown   Psychiatric: He has a normal mood and affect. Judgment normal.  Nursing note and vitals reviewed.    ED Treatments / Results  DIAGNOSTIC STUDIES:  Oxygen Saturation is 97% on RA, normal by my interpretation.    COORDINATION OF CARE:  12:20 PM Discussed treatment plan with pt and family at bedside.  Labs (all labs ordered are listed, but only abnormal results are displayed) Labs Reviewed  COMPREHENSIVE METABOLIC PANEL - Abnormal; Notable for the following:       Result Value   Sodium 133 (*)    Glucose, Bld 174 (*)    BUN 23 (*)    Total Protein 5.9 (*)    Albumin 2.3 (*)    Total Bilirubin 1.5 (*)    All other components within normal limits  CBC WITH DIFFERENTIAL/PLATELET - Abnormal; Notable for the  following:    WBC 26.0 (*)    RBC 4.01 (*)    Hemoglobin 12.9 (*)    HCT 38.6 (*)    Neutro Abs 22.3 (*)    Monocytes Absolute 1.8 (*)    All other components within normal limits  URINALYSIS, ROUTINE W REFLEX MICROSCOPIC (NOT AT [facility]) - Abnormal; Notable for the following:    Color, Urine AMBER (*)    APPearance CLOUDY (*)    Bilirubin Urine SMALL (*)    Leukocytes, UA SMALL (*)    All other components within normal limits  URINE MICROSCOPIC-ADD ON - Abnormal; Notable for the following:    Squamous Epithelial / LPF 0-5 (*)    Bacteria, UA RARE (*)    All other components within normal limits  I-STAT CG4 LACTIC ACID, ED - Abnormal; Notable for the following:    Lactic Acid, Venous 2.28 (*)    All other components within normal limits  CULTURE, BLOOD (ROUTINE X 2)  CULTURE, BLOOD (ROUTINE X 2)  URINE CULTURE  I-STAT CG4 LACTIC ACID, ED    EKG  EKG Interpretation None       Radiology Dg Chest 2 View  Result Date: 05/02/2016 CLINICAL DATA:  Weakness and fever EXAM: CHEST  2 VIEW COMPARISON:  04/26/2016; 03/28/2016; 05/31/2013 ; CT abdomen pelvis - 04/22/2016 FINDINGS: Grossly unchanged cardiac silhouette and mediastinal contours with atherosclerotic plaque within thoracic aorta. Lung volumes were reduced. Stable postsurgical change of the right upper lung with volume loss and elevation involving the anterior aspect the right hemidiaphragm. Note is again made of a interposed loop of hepatic flexure of the colon below the right hemidiaphragm as seen on prior abdominal CT. Grossly unchanged mild diffuse slightly nodular thickening of the pulmonary interstitium. No discrete focal airspace opacities. No pleural effusion or pneumothorax. No evidence of edema. No acute osseus abnormalities. Post right rotator cuff repair. Degenerative change within the lower thoracic and upper lumbar spine. IMPRESSION: 1. Similar findings of decreased lung volumes without acute cardiopulmonary disease. No  new focal airspace opacities to suggest pneumonia. 2. Stable postsurgical change of the right upper lung with associated volume loss. Electronically Signed   By: Simonne Come M.D.   On: 05/02/2016 13:27   Ct Abdomen Pelvis W Contrast  Result Date: 05/02/2016 CLINICAL DATA:  Hemorrhagic pancreatitis with periumbilical pain. EXAM: CT ABDOMEN AND PELVIS WITH CONTRAST TECHNIQUE: Multidetector CT imaging of the abdomen  and pelvis was performed using the standard protocol following bolus administration of intravenous contrast. CONTRAST:  75 cc of Isovue-300 IV COMPARISON:  04/22/2016 FINDINGS: Lower chest: There is bibasilar dependent atelectasis. Three-vessel coronary arteriosclerosis. No pericardial effusion. Heart size is within normal to the extent visible. There is aortic atherosclerosis of the descending aorta. There is a small hiatal hernia. Hepatobiliary: No space-occupying mass or abscess of the liver. Slight surface nodularity of the liver consistent cirrhosis. Gallstones are seen within a physiologically distended gallbladder. There is colonic interposition large bowel between the liver and ventral abdominal wall. Pancreas: There are multifocal abscesses within the phlegmonous inflammatory soft tissue mass outlining the pancreas which have developed since the interim. There is an approximately 5.8 x 3.5 x 5.6 cm abscess immediately anterior to the third portion of the duodenum interposed between the duodenum and SMA, series 2, image 46, series 5, image 52. There is an approximately 5.9 x 4.2 x 4.2 cm right upper quadrant abscess just anterior to the junction of the second and third portion of the duodenum, series 2 image 40 all, series 5, image 44. There is an approximately 6.1 x 3.9 x 3.3 cm abscess with air just anterior to the distal third portion of the duodenum and overlying the SMA, series 2 image 35 and series 5 image 44. Smaller foci of air seen within the phlegmonous mass further cephalad anterior  to the pancreatic head and midbody, series 2, image 30. Faint enhancement of the atretic pancreatic gland is seen within the phlegmonous mass. There appears to have been some necrosis of portions of the pancreatic body and head since initial study from 04/03/2016. The portal and splenic veins remain patent with slight narrowing or mass-effect on the mid to distal splenic vein, series 2, image 32 . Spleen: Normal in size without focal abnormality. Adrenals/Urinary Tract: Stable appearance of the adrenal glands and kidneys without obstructive uropathy. Bladder is partially distended. Stomach/Bowel: There is mild sympathetic thickening of small intestine in the region of the pancreas. No bowel obstruction is noted. Vascular/Lymphatic: Aortoiliac and branch vessel atherosclerosis. The celiac vessels, SMA and IMA appear patent. Reproductive: Prostate is mildly enlarged up to 5.3 cm with peripheral zone calcifications. Other: Small amount of fluid is seen in the pelvis with trace edema along the pericolic gutters and adjacent to the liver. Musculoskeletal: Thoracolumbar degenerative disc disease consistent with spondylosis. No acute osseous abnormality. 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 Decrease in pancreatic gland enhancement consistent with probable areas of necrosis since the initial study. Patent splenic and portal veins and SMA. Splenic vein is slightly narrowed along its midportion from the phlegmonous mass. Critical Value/emergent results were called by telephone at the time of interpretation on 05/02/2016 at 3:22 pm to Dr. Raeford Razor , who verbally acknowledged these results. Electronically Signed   By: Tollie Eth M.D.   On: 05/02/2016 15:22    Procedures Procedures (including critical care time)  Medications Ordered in ED Medications  piperacillin-tazobactam (ZOSYN) IVPB 3.375 g (3.375 g  Intravenous New Bag/Given 05/02/16 1632)  piperacillin-tazobactam (ZOSYN) IVPB 3.375 g (not administered)  sodium chloride 0.9 % bolus 1,000 mL (0 mLs Intravenous Stopped 05/02/16 1400)  iopamidol (ISOVUE-300) 61 % injection (75 mLs Intravenous Contrast Given 05/02/16 1524)     Initial Impression / Assessment and Plan / ED Course  I have reviewed the triage vital signs and the nursing notes.  Pertinent labs & imaging results that were  available during my care of the patient were reviewed by me and considered in my medical decision making (see chart for details).  Clinical Course     82yM with pregressive weakness. Complicated recent medical hx. CT today with multifocal intrababdominal abscesses. Discussed with medicine and surgery.   Final Clinical Impressions(s) / ED Diagnoses   Final diagnoses:  Pancreatic abscess    New Prescriptions New Prescriptions   No medications on file   I personally preformed the services scribed in my presence. The recorded information has been reviewed is accurate. Raeford RazorStephen Darbie Biancardi, MD.     Raeford RazorStephen Sabreen Kitchen, MD 05/02/16 [phone]

## 2016-05-03 ENCOUNTER — Inpatient Hospital Stay (HOSPITAL_COMMUNITY): Payer: Medicare Other

## 2016-05-03 ENCOUNTER — Encounter (HOSPITAL_COMMUNITY): Payer: Self-pay | Admitting: *Deleted

## 2016-05-03 DIAGNOSIS — E440 Moderate protein-calorie malnutrition: Secondary | ICD-10-CM

## 2016-05-03 DIAGNOSIS — L89152 Pressure ulcer of sacral region, stage 2: Secondary | ICD-10-CM

## 2016-05-03 DIAGNOSIS — B356 Tinea cruris: Secondary | ICD-10-CM

## 2016-05-03 DIAGNOSIS — D72829 Elevated white blood cell count, unspecified: Secondary | ICD-10-CM

## 2016-05-03 LAB — GLUCOSE, CAPILLARY
Glucose-Capillary: 116 mg/dL — ABNORMAL HIGH (ref 65–99)
Glucose-Capillary: 118 mg/dL — ABNORMAL HIGH (ref 65–99)
Glucose-Capillary: 102 mg/dL — ABNORMAL HIGH (ref 65–99)
Glucose-Capillary: 112 mg/dL — ABNORMAL HIGH (ref 65–99)

## 2016-05-03 LAB — COMPREHENSIVE METABOLIC PANEL
ALBUMIN: 1.9 g/dL — AB (ref 3.5–5.0)
ALT: 20 U/L (ref 17–63)
ANION GAP: 10 (ref 5–15)
AST: 23 U/L (ref 15–41)
Alkaline Phosphatase: 76 U/L (ref 38–126)
BILIRUBIN TOTAL: 1.3 mg/dL — AB (ref 0.3–1.2)
BUN: 22 mg/dL — ABNORMAL HIGH (ref 6–20)
CO2: 23 mmol/L (ref 22–32)
Calcium: 8 mg/dL — ABNORMAL LOW (ref 8.9–10.3)
Chloride: 103 mmol/L (ref 101–111)
Creatinine, Ser: 0.81 mg/dL (ref 0.61–1.24)
GFR calc non Af Amer: >60 mL/min (ref >60)
GLUCOSE: 108 mg/dL — AB (ref 65–99)
POTASSIUM: 4.2 mmol/L (ref 3.5–5.1)
Sodium: 136 mmol/L (ref 135–145)
TOTAL PROTEIN: 5.1 g/dL — AB (ref 6.5–8.1)

## 2016-05-03 LAB — PROTIME-INR
INR: 1.43
Prothrombin Time: 17.5 seconds — ABNORMAL HIGH (ref 11.4–15.2)

## 2016-05-03 LAB — URINE CULTURE: Culture: NO GROWTH

## 2016-05-03 LAB — TROPONIN I
TROPONIN I: 0.04 ng/mL — AB (ref <0.03)
TROPONIN I: 0.03 ng/mL — AB (ref <0.03)

## 2016-05-03 LAB — CBC
HEMATOCRIT: 33.9 % — AB (ref 39.0–52.0)
HEMOGLOBIN: 11 g/dL — AB (ref 13.0–17.0)
MCH: 31.2 pg (ref 26.0–34.0)
MCHC: 32.4 g/dL (ref 30.0–36.0)
MCV: 96 fL (ref 78.0–100.0)
Platelets: 216 10*3/uL (ref 150–400)
RBC: 3.53 MIL/uL — AB (ref 4.22–5.81)
RDW: 14 % (ref 11.5–15.5)
WBC: 23.6 10*3/uL — AB (ref 4.0–10.5)

## 2016-05-03 MED ORDER — SODIUM CHLORIDE 0.9% FLUSH
10.0000 mL | INTRAVENOUS | Status: DC | PRN
  Administered 2016-05-08: 10 mL
  Filled 2016-05-03: qty 40

## 2016-05-03 MED ORDER — MORPHINE SULFATE (PF) 2 MG/ML IV SOLN
1.0000 mg | INTRAVENOUS | Status: DC | PRN
  Administered 2016-05-03: 1 mg via INTRAVENOUS
  Administered 2016-05-04 – 2016-05-08 (×9): 2 mg via INTRAVENOUS
  Filled 2016-05-03 (×10): qty 1

## 2016-05-03 MED ORDER — DILTIAZEM HCL-DEXTROSE 100-5 MG/100ML-% IV SOLN (PREMIX)
5.0000 mg/h | INTRAVENOUS | Status: DC
  Administered 2016-05-03: 5 mg/h via INTRAVENOUS
  Filled 2016-05-03: qty 100

## 2016-05-03 MED ORDER — FENTANYL CITRATE (PF) 100 MCG/2ML IJ SOLN
INTRAMUSCULAR | Status: AC | PRN
  Administered 2016-05-03: 25 ug via INTRAVENOUS
  Administered 2016-05-03: 50 ug via INTRAVENOUS

## 2016-05-03 MED ORDER — SODIUM CHLORIDE 0.9% FLUSH
10.0000 mL | Freq: Two times a day (BID) | INTRAVENOUS | Status: DC
  Administered 2016-05-03 – 2016-05-10 (×9): 10 mL
  Administered 2016-05-10: 20 mL
  Administered 2016-05-11: 10 mL
  Administered 2016-05-11: 20 mL
  Administered 2016-05-12 – 2016-05-20 (×15): 10 mL

## 2016-05-03 MED ORDER — LIDOCAINE HCL 1 % IJ SOLN
INTRAMUSCULAR | Status: AC
  Filled 2016-05-03: qty 20

## 2016-05-03 MED ORDER — FENTANYL CITRATE (PF) 100 MCG/2ML IJ SOLN
INTRAMUSCULAR | Status: AC
  Filled 2016-05-03: qty 4

## 2016-05-03 MED ORDER — MIDAZOLAM HCL 2 MG/2ML IJ SOLN
INTRAMUSCULAR | Status: AC | PRN
  Administered 2016-05-03: 1 mg via INTRAVENOUS

## 2016-05-03 MED ORDER — MIDAZOLAM HCL 2 MG/2ML IJ SOLN
INTRAMUSCULAR | Status: AC
  Filled 2016-05-03: qty 4

## 2016-05-03 MED ORDER — DILTIAZEM LOAD VIA INFUSION
10.0000 mg | Freq: Once | INTRAVENOUS | Status: AC
  Administered 2016-05-03: 10 mg via INTRAVENOUS
  Filled 2016-05-03: qty 10

## 2016-05-03 MED ORDER — SODIUM CHLORIDE 0.9 % IV SOLN
500.0000 mg | Freq: Four times a day (QID) | INTRAVENOUS | Status: DC
  Administered 2016-05-03 – 2016-05-09 (×24): 500 mg via INTRAVENOUS
  Filled 2016-05-03 (×26): qty 500

## 2016-05-03 NOTE — Progress Notes (Signed)
Cortrak team consulted to obtain post pyloric TF access at the LOT. At full length of tube, the tube tip was located roughly at D2. Pt had a difficult to navigate stomach and bowel, suspect the tip will progress as the tube straightens out with normal peristalsis.   RD asked RN to order xray in 1hour. PT is working with patient now. THis should hopefully help facilitate movement as well.   Colin LouisNathan Tanea West RD, LDN, CNSC Clinical Nutrition Pager: [phone] 05/03/2016 3:42 PM

## 2016-05-03 NOTE — Progress Notes (Signed)
Peripherally Inserted Central Catheter/Midline Placement  The IV Nurse has discussed with the patient and/or persons authorized to consent for the patient, the purpose of this procedure and the potential benefits and risks involved with this procedure.  The benefits include less needle sticks, lab draws from the catheter, and the patient may be discharged home with the catheter. Risks include, but not limited to, infection, bleeding, blood clot (thrombus formation), and puncture of an artery; nerve damage and irregular heartbeat and possibility to perform a PICC exchange if needed/ordered by physician.  Alternatives to this procedure were also discussed.  Bard Power PICC patient education guide, fact sheet on infection prevention and patient information card has been provided to patient /or left at bedside.    PICC/Midline Placement Documentation  PICC Double Lumen 05/03/16 PICC Right Basilic 40 cm 0 cm (Active)  Indication for Insertion or Continuance of Line Administration of hyperosmolar/irritating solutions (i.e. TPN, Vancomycin, etc.);Prolonged intravenous therapies 05/03/2016  8:34 AM  Exposed Catheter (cm) 0 cm 05/03/2016  8:34 AM  Site Assessment Clean;Dry;Intact 05/03/2016  8:34 AM  Lumen #1 Status Flushed;Saline locked;Blood return noted 05/03/2016  8:34 AM  Lumen #2 Status Flushed;Saline locked;Blood return noted 05/03/2016  8:34 AM  Dressing Type Transparent 05/03/2016  8:34 AM  Dressing Status Clean;Dry;Intact;Antimicrobial disc in place 05/03/2016  8:34 AM  Line Care Connections checked and tightened 05/03/2016  8:34 AM  Line Adjustment (NICU/IV Team Only) No 05/03/2016  8:34 AM  Dressing Intervention New dressing 05/03/2016  8:34 AM  Dressing Change Due 05/10/16 05/03/2016  8:34 AM       Elliot Dallyiggs, Ruslan Mccabe Wright 05/03/2016, 8:34 AM

## 2016-05-03 NOTE — Sedation Documentation (Signed)
Patient denies pain and is resting comfortably.  

## 2016-05-03 NOTE — Progress Notes (Signed)
PROGRESS NOTE  Colin HasGraham N Enderle  AOZ:[id number] DOB: 1933-12-04 DOA: 05/02/2016 PCP: Verl BangsADIONTCHENKO, ALEXEI, MD Outpatient Specialists:  Subjective: Seen with his son at bedside, complaining about mild to moderate abdominal pain. Aspiration of peripancreatic abscess done by IR earlier today, with retrieval of 70 mL of purulent fluid.  Brief Narrative:  Colin West is a 82 y.o. male  with complex history recently hospitalized from 10/8-10/12 for hemorrhagic pancreatitis from gallblader source, and on  11/7-11/13 for Severe Sepsis due to E.Coli pyelonephritis, discharged to  [facility] on oral antibiotics, brought to the emergency department with worsening generalized weakness, today  evere enough with  associated nausea and vomiting. He denies shortness of breath or productive cough. He has been unable to ambulate due to weakness. He denies any abdominal pain, he has occasional nausea without vomiting, he denies any diarrhea or constipation. He has not been able to eat a normal meal, lost about 15 lbs in the last 2 months.  He reports frequent hiccups. He denies any lower extremity swelling. Due to prolonged bed rest, he has developed some the ulcers in the sacrum and gluteal  areas as well as some minimal heel blanching hese is being cared at the facility as well. No bleeding issues are reported such as hemoptysis, cemetery messages, hematuria, or hematochezia  Assessment & Plan:   Principal Problem:   Pancreatic abscess Active Problems:   Essential hypertension   Hyperbilirubinemia   Malnutrition of moderate degree   Leukocytosis   History of acute pancreatitis   Congestive dilated cardiomyopathy (HCC)   Tinea cruris   Sacral decubitus ulcer, stage II   Multiple Pancreatic Abscesses -Presented to the hospital with abdominal pain, nausea and loss of appetite. -CT scan showed multiple peripancreatic abscesses, was recently in the hospital from 10/8-10/12 for hemorrhagic pancreatitis  from gallbladder source. -Had low-grade fever of 100.1, nontoxic without tachycardia or tachypnea. -Started on vancomycin and Zosyn, will switch to Primaxin per general surgery recommendations. -Continue IV fluids.  Protein calorie malnutrition  -Lost 20 pounds in the past 2 months, albumin is 2.3. -Per general surgery recommendation try postpyloric enteral nutrition prior to TPN    Type II Diabetes Current blood sugar level is 174 Recent Labs       Lab Results  Component Value Date   HGBA1C 5.9 (H) 03/23/2016    Hold home oral diabetic medications.  SSI    Hypertension BP 125/65   Pulse 86   Controlled Continue home anti-hypertensive medications    Hiccups Continue Reglan.  Hyperlipidemia Continue home statins   Dilated Cardiomyopathy in view of recent sepsis. 2 D echo recently shows EF 30-35 % with Gr 1 DD Cards to see, appreciate involvement     DVT prophylaxis:  Code Status: Full Code Family Communication:  Disposition Plan:  Diet: Diet NPO time specified Except for: Sips with Meds  Consultants:   Gen. surgery.  Interventional radiology  Procedures:   Aspiration of abscess on 05/04/2016  Antimicrobials:   Zosyn and vancomycin changed to Primaxin on 05/03/2016  Objective: Vitals:   05/03/16 1020 05/03/16 1025 05/03/16 1029 05/03/16 1045  BP: (!) 110/57 (!) 105/46 (!) 111/46 111/84  Pulse: 99 98 98 98  Resp: (!) 22 (!) 22 (!) 22 (!) 22  Temp:      TempSrc:      SpO2: 96% 96% 96% 95%  Weight:      Height:        Intake/Output Summary (Last 24 hours) at 05/03/16 1054  Last data filed at 05/03/16 0900  Gross per 24 hour  Intake             2525 ml  Output              700 ml  Net             1825 ml   Filed Weights   05/02/16 1835  Weight: 72.9 kg (160 lb 11.5 oz)    Examination: General exam: Appears calm and comfortable  Respiratory system: Clear to auscultation. Respiratory effort normal. Cardiovascular system: S1 & S2  heard, RRR. No JVD, murmurs, rubs, gallops or clicks. No pedal edema. Gastrointestinal system: Abdomen is nondistended, soft and nontender. No organomegaly or masses felt. Normal bowel sounds heard. Central nervous system: Alert and oriented. No focal neurological deficits. Extremities: Symmetric 5 x 5 power. Skin: No rashes, lesions or ulcers Psychiatry: Judgement and insight appear normal. Mood & affect appropriate.   Data Reviewed: I have personally reviewed following labs and imaging studies  CBC:  Recent Labs Lab 05/01/16 05/02/16 1146 05/03/16 0242  WBC 19.9 26.0* 23.6*  NEUTROABS  --  22.3*  --   HGB 11.9* 12.9* 11.0*  HCT 35* 38.6* 33.9*  MCV  --  96.3 96.0  PLT 200 231 216   Basic Metabolic Panel:  Recent Labs Lab 04/27/16 0417 04/28/16 0445 05/01/16 05/02/16 1146 05/03/16 0242  NA 132* 131* 136* 133* 136  K 4.2 4.1 4.0 4.3 4.2  CL 104 100*  --  101 103  CO2 25 25  --  23 23  GLUCOSE 130* 133*  --  174* 108*  BUN 14 19 23* 23* 22*  CREATININE 0.68 0.67 0.7 0.91 0.81  CALCIUM 8.1* 8.4*  --  8.9 8.0*  MG 1.8 1.8  --   --   --    GFR: Estimated Creatinine Clearance: 72.7 mL/min (by C-G formula based on SCr of 0.81 mg/dL). Liver Function Tests:  Recent Labs Lab 05/01/16 05/02/16 1146 05/03/16 0242  AST 22 27 23   ALT 20 25 20   ALKPHOS 86 88 76  BILITOT  --  1.5* 1.3*  PROT  --  5.9* 5.1*  ALBUMIN  --  2.3* 1.9*    Recent Labs Lab 05/02/16 1729  LIPASE 18   No results for input(s): AMMONIA in the last 168 hours. Coagulation Profile:  Recent Labs Lab 05/03/16 0242  INR 1.43   Cardiac Enzymes:  Recent Labs Lab 05/02/16 1729 05/02/16 2305 05/03/16 0242  TROPONINI 0.03* 0.04* 0.03*   BNP (last 3 results) No results for input(s): PROBNP in the last 8760 hours. HbA1C: No results for input(s): HGBA1C in the last 72 hours. CBG:  Recent Labs Lab 04/28/16 1209 04/28/16 1713 05/02/16 1855 05/02/16 2142 05/03/16 0745  GLUCAP 165* 137*  114* 112* 116*   Lipid Profile: No results for input(s): CHOL, HDL, LDLCALC, TRIG, CHOLHDL, LDLDIRECT in the last 72 hours. Thyroid Function Tests: No results for input(s): TSH, T4TOTAL, FREET4, T3FREE, THYROIDAB in the last 72 hours. Anemia Panel: No results for input(s): VITAMINB12, FOLATE, FERRITIN, TIBC, IRON, RETICCTPCT in the last 72 hours. Urine analysis:    Component Value Date/Time   COLORURINE AMBER (A) 05/02/2016 1149   APPEARANCEUR CLOUDY (A) 05/02/2016 1149   LABSPEC 1.024 05/02/2016 1149   PHURINE 6.0 05/02/2016 1149   GLUCOSEU NEGATIVE 05/02/2016 1149   HGBUR NEGATIVE 05/02/2016 1149   BILIRUBINUR SMALL (A) 05/02/2016 1149   KETONESUR NEGATIVE 05/02/2016 1149   PROTEINUR  NEGATIVE 05/02/2016 1149   UROBILINOGEN 0.2 03/08/2010 1026   NITRITE NEGATIVE 05/02/2016 1149   LEUKOCYTESUR SMALL (A) 05/02/2016 1149   Sepsis Labs: @LABRCNTIP (procalcitonin:4,lacticidven:4)  ) Recent Results (from the past 240 hour(s))  Culture, blood (Routine x 2)     Status: None (Preliminary result)   Collection Time: 05/02/16 11:45 AM  Result Value Ref Range Status   Specimen Description BLOOD LEFT ANTECUBITAL  Final   Special Requests BOTTLES DRAWN AEROBIC AND ANAEROBIC 5CC  Final   Culture NO GROWTH < 24 HOURS  Final   Report Status PENDING  Incomplete  Urine culture     Status: None   Collection Time: 05/02/16 11:49 AM  Result Value Ref Range Status   Specimen Description URINE, CLEAN CATCH  Final   Special Requests NONE  Final   Culture NO GROWTH  Final   Report Status 05/03/2016 FINAL  Final  Culture, blood (Routine x 2)     Status: None (Preliminary result)   Collection Time: 05/02/16 12:40 PM  Result Value Ref Range Status   Specimen Description BLOOD RIGHT ANTECUBITAL  Final   Special Requests BOTTLES DRAWN AEROBIC AND ANAEROBIC 10ML  Final   Culture NO GROWTH < 24 HOURS  Final   Report Status PENDING  Incomplete  MRSA PCR Screening     Status: None   Collection Time:  05/02/16  6:32 PM  Result Value Ref Range Status   MRSA by PCR NEGATIVE NEGATIVE Final    Comment:        The GeneXpert MRSA Assay (FDA approved for NASAL specimens only), is one component of a comprehensive MRSA colonization surveillance program. It is not intended to diagnose MRSA infection nor to guide or monitor treatment for MRSA infections.      Invalid input(s): PROCALCITONIN, LACTICACIDVEN   Radiology Studies: X-ray Chest Pa And Lateral  Result Date: 05/03/2016 CLINICAL DATA:  Preoperative examination.  PICC placement. EXAM: CHEST  2 VIEW COMPARISON:  PA and lateral chest 05/02/2016. FINDINGS: New right PICC is in place with the tip near the superior cavoatrial junction. Lung volumes are low but the lungs are clear. Heart size is normal. No pneumothorax or pleural effusion. Remote right rib fractures are seen. Postoperative change right lung apex and right shoulder noted. IMPRESSION: No active cardiopulmonary disease. Electronically Signed   By: Drusilla Kannerhomas  Dalessio M.D.   On: 05/03/2016 09:26   Dg Chest 2 View  Result Date: 05/02/2016 CLINICAL DATA:  Weakness and fever EXAM: CHEST  2 VIEW COMPARISON:  04/26/2016; 03/28/2016; 05/31/2013 ; CT abdomen pelvis - 04/22/2016 FINDINGS: Grossly unchanged cardiac silhouette and mediastinal contours with atherosclerotic plaque within thoracic aorta. Lung volumes were reduced. Stable postsurgical change of the right upper lung with volume loss and elevation involving the anterior aspect the right hemidiaphragm. Note is again made of a interposed loop of hepatic flexure of the colon below the right hemidiaphragm as seen on prior abdominal CT. Grossly unchanged mild diffuse slightly nodular thickening of the pulmonary interstitium. No discrete focal airspace opacities. No pleural effusion or pneumothorax. No evidence of edema. No acute osseus abnormalities. Post right rotator cuff repair. Degenerative change within the lower thoracic and upper  lumbar spine. IMPRESSION: 1. Similar findings of decreased lung volumes without acute cardiopulmonary disease. No new focal airspace opacities to suggest pneumonia. 2. Stable postsurgical change of the right upper lung with associated volume loss. Electronically Signed   By: Simonne ComeJohn  Watts M.D.   On: 05/02/2016 13:27   Ct Abdomen Pelvis  W Contrast  Result Date: 05/02/2016 CLINICAL DATA:  Hemorrhagic pancreatitis with periumbilical pain. EXAM: CT ABDOMEN AND PELVIS WITH CONTRAST TECHNIQUE: Multidetector CT imaging of the abdomen and pelvis was performed using the standard protocol following bolus administration of intravenous contrast. CONTRAST:  75 cc of Isovue-300 IV COMPARISON:  04/22/2016 FINDINGS: Lower chest: There is bibasilar dependent atelectasis. Three-vessel coronary arteriosclerosis. No pericardial effusion. Heart size is within normal to the extent visible. There is aortic atherosclerosis of the descending aorta. There is a small hiatal hernia. Hepatobiliary: No space-occupying mass or abscess of the liver. Slight surface nodularity of the liver consistent cirrhosis. Gallstones are seen within a physiologically distended gallbladder. There is colonic interposition large bowel between the liver and ventral abdominal wall. Pancreas: There are multifocal abscesses within the phlegmonous inflammatory soft tissue mass outlining the pancreas which have developed since the interim. There is an approximately 5.8 x 3.5 x 5.6 cm abscess immediately anterior to the third portion of the duodenum interposed between the duodenum and SMA, series 2, image 46, series 5, image 52. There is an approximately 5.9 x 4.2 x 4.2 cm right upper quadrant abscess just anterior to the junction of the second and third portion of the duodenum, series 2 image 40 all, series 5, image 44. There is an approximately 6.1 x 3.9 x 3.3 cm abscess with air just anterior to the distal third portion of the duodenum and overlying the SMA, series  2 image 35 and series 5 image 44. Smaller foci of air seen within the phlegmonous mass further cephalad anterior to the pancreatic head and midbody, series 2, image 30. Faint enhancement of the atretic pancreatic gland is seen within the phlegmonous mass. There appears to have been some necrosis of portions of the pancreatic body and head since initial study from 04/03/2016. The portal and splenic veins remain patent with slight narrowing or mass-effect on the mid to distal splenic vein, series 2, image 32 . Spleen: Normal in size without focal abnormality. Adrenals/Urinary Tract: Stable appearance of the adrenal glands and kidneys without obstructive uropathy. Bladder is partially distended. Stomach/Bowel: There is mild sympathetic thickening of small intestine in the region of the pancreas. No bowel obstruction is noted. Vascular/Lymphatic: Aortoiliac and branch vessel atherosclerosis. The celiac vessels, SMA and IMA appear patent. Reproductive: Prostate is mildly enlarged up to 5.3 cm with peripheral zone calcifications. Other: Small amount of fluid is seen in the pelvis with trace edema along the pericolic gutters and adjacent to the liver. Musculoskeletal: Thoracolumbar degenerative disc disease consistent with spondylosis. No acute osseous abnormality. 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 Decrease in pancreatic gland enhancement consistent with probable areas of necrosis since the initial study. Patent splenic and portal veins and SMA. Splenic vein is slightly narrowed along its midportion from the phlegmonous mass. Critical Value/emergent results were called by telephone at the time of interpretation on 05/02/2016 at 3:22 pm to Dr. Raeford Razor , who verbally acknowledged these results. Electronically Signed   By: Tollie Eth M.D.   On: 05/02/2016 15:22        Scheduled Meds: . atorvastatin   10 mg Oral q1800  . famotidine (PEPCID) IV  20 mg Intravenous Q12H  . fentaNYL      . fluconazole  100 mg Oral Daily  . insulin aspart  0-9 Units Subcutaneous TID WC  . lidocaine      . lipase/protease/amylase  36,000 Units Oral TID AC  .  lisinopril  2.5 mg Oral Daily  . metoprolol  100 mg Oral BID  . midazolam      . piperacillin-tazobactam (ZOSYN)  IV  3.375 g Intravenous Q8H  . sodium chloride flush  10-40 mL Intracatheter Q12H  . sodium chloride flush  3 mL Intravenous Q12H   Continuous Infusions: . sodium chloride 125 mL/hr at 05/03/16 0341     LOS: 1 day    Time spent: 35 minutes    Finley Dinkel A, MD [facility] Pager [phone]  If 7PM-7AM, please contact night-coverage [email or web address] Password [email or web address] 05/03/2016, 10:54 AM

## 2016-05-03 NOTE — Procedures (Signed)
Technically successful aspiration of peripancreatic fluid collection yielding 70 cc of purulent appearing fluid.   All aspirated samples sent to the laboratory for analysis.   EBL: Minimal No immediate post procedural complications.   Katherina RightJay Violia Knopf, MD Pager #: [phone]

## 2016-05-03 NOTE — Progress Notes (Signed)
05/03/16  0900  Applied foam dressings bilateral to patient's heels for preventive measures.

## 2016-05-03 NOTE — Progress Notes (Signed)
Patient ID: Colin West, male   DOB: Oct 20, 1933, 82 y.o.   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  Would consider changing abx to a carbapenem or ceftaz/quinolone and flagyl.  Would consider enteral nutrition via nasojejunal tube prior to tna.  Will continue to follow

## 2016-05-03 NOTE — Progress Notes (Signed)
PT Cancellation Note  Patient Details Name: Colin West MRN: [id number] DOB: Mar 05, 1934   Cancelled Treatment:    Reason Eval/Treat Not Completed: Patient at procedure or test/unavailable   Angelina OkCary W [facility] 05/03/2016, 2:45 PM [facility] Center[facility] Rema Lievanos PT [phone]

## 2016-05-03 NOTE — Progress Notes (Signed)
Chief Complaint: Patient was seen in consultation today for pancreatitis with peripancreatic fluid collections at the request of Dr. Claud KelpHaywood Ingram  Referring Physician(s): Dr. Claud KelpHaywood Ingram  Supervising Physician: Simonne ComeWatts, John  [facility]  History of Present Illness: Colin West is a 82 y.o. male with pancreatitis secondary to gallbladder disease, but now has peripancreatic fluid collections/abscesses. He has been admitted and placed on IV abx but IR is asked to aspirate fluid sample for culture to target therapy. Chart, meds, labs, imaging reviewed. Pt has been NPO since admission.  Past Medical History:  Diagnosis Date  . Anxiety   . Carotid artery stenosis    a. < 40% bilateral stenosis by US in 2014  . Depression    denies  . Diabetes mellitus    denies  . History of kidney stones   . Hyperlipidemia   . Hypertension     Past Surgical History:  Procedure Laterality Date  . ELBOW ARTHROSCOPY Right   . INGUINAL HERNIA REPAIR Left Sept 2006   Dr Corliss Skainssuei  . INGUINAL HERNIA REPAIR Bilateral 06/01/2013   Procedure: LAPAROSCOPIC EXPLORATION BILATERAL INGUINAL HERNIA REPAIR;  Surgeon: Ardeth SportsmanSteven C. Gross, MD;  Location: [facility] OR;  Service: General;  Laterality: Bilateral;  Left Femoral, Left Recurrent inguinal, Right Inguinal  . INSERTION OF MESH Bilateral 06/01/2013   Procedure: INSERTION OF MESH;  Surgeon: Ardeth SportsmanSteven C. Gross, MD;  Location: [facility] OR;  Service: General;  Laterality: Bilateral;  Left Femoral, Left Recurrent inguinal, Right Inguinal  . LAPAROSCOPIC LYSIS OF ADHESIONS Bilateral 06/01/2013   Procedure: LAPAROSCOPIC LYSIS OF ADHESIONS;  Surgeon: Ardeth SportsmanSteven C. Gross, MD;  Location: [facility] OR;  Service: General;  Laterality: Bilateral;  . LUNG REMOVAL, PARTIAL  Sept. 2011    Dr. Edwyna ShellBurney  . ROTATOR CUFF REPAIR     Bilateral repair    Allergies: Patient has no known allergies.  Medications:  Current Facility-Administered Medications:  .  lidocaine  (XYLOCAINE) 1 % (with pres) injection, , , ,  .  0.9 %  sodium chloride infusion, , Intravenous, Continuous, Marcos EkeSara E Wertman, PA-C, Last Rate: 125 mL/hr at 05/03/16 0341 .  acetaminophen (TYLENOL) tablet 650 mg, 650 mg, Oral, Q6H PRN, 650 mg at 05/02/16 2021 **OR** acetaminophen (TYLENOL) suppository 650 mg, 650 mg, Rectal, Q6H PRN, Marcos EkeSara E Wertman, PA-C .  atorvastatin (LIPITOR) tablet 10 mg, 10 mg, Oral, q1800, Rhona RaiderJacob J Stinson, DO, 10 mg at 05/02/16 2022 .  bisacodyl (DULCOLAX) suppository 10 mg, 10 mg, Rectal, Daily PRN, Marcos EkeSara E Wertman, PA-C .  famotidine (PEPCID) IVPB 20 mg premix, 20 mg, Intravenous, Q12H, Sherrie GeorgeWillard Jennings, PA-C, 20 mg at 05/02/16 2118 .  fluconazole (DIFLUCAN) tablet 100 mg, 100 mg, Oral, Daily, Rhona RaiderJacob J Stinson, DO, 100 mg at 05/02/16 2021 .  insulin aspart (novoLOG) injection 0-9 Units, 0-9 Units, Subcutaneous, TID WC, Rhona RaiderJacob J Stinson, DO .  lipase/protease/amylase (CREON) capsule 36,000 Units, 36,000 Units, Oral, TID AC, Marcos EkeSara E Wertman, PA-C .  lisinopril (PRINIVIL,ZESTRIL) tablet 2.5 mg, 2.5 mg, Oral, Daily, Rhona RaiderJacob J Stinson, DO .  magnesium citrate solution 1 Bottle, 1 Bottle, Oral, Once PRN, Marcos EkeSara E Wertman, PA-C .  metoCLOPramide (REGLAN) tablet 5-10 mg, 5-10 mg, Oral, Q8H PRN, Marcos EkeSara E Wertman, PA-C .  metoprolol tartrate (LOPRESSOR) tablet 100 mg, 100 mg, Oral, BID, Sung AmabileSara E Wertman, PA-C .  morphine 2 MG/ML injection 1-2 mg, 1-2 mg, Intravenous, Q4H PRN, Clydia LlanoMutaz Elmahi, MD .  ondansetron (ZOFRAN) tablet 4 mg, 4 mg, Oral, Q6H PRN **  OR** ondansetron (ZOFRAN) injection 4 mg, 4 mg, Intravenous, Q6H PRN, Marcos Eke, PA-C .  piperacillin-tazobactam (ZOSYN) IVPB 3.375 g, 3.375 g, Intravenous, Q8H, Crystal Jacqualin Combes, RPH, 3.375 g at 05/03/16 0332 .  senna-docusate (Senokot-S) tablet 1 tablet, 1 tablet, Oral, QHS PRN, Sung Amabile Wertman, PA-C .  sodium chloride flush (NS) 0.9 % injection 10-40 mL, 10-40 mL, Intracatheter, Q12H, Clydia Llano, MD .  sodium chloride flush (NS) 0.9 %  injection 10-40 mL, 10-40 mL, Intracatheter, PRN, Clydia Llano, MD .  sodium chloride flush (NS) 0.9 % injection 3 mL, 3 mL, Intravenous, Q12H, Marcos Eke, PA-C, 3 mL at 05/02/16 2119    Family History  Problem Relation Age of Onset  . Heart disease Brother     Heart Disease before age 47  . Heart attack Brother     Social History   Social History  . Marital status: Divorced    Spouse name: N/A  . Number of children: N/A  . Years of education: N/A   Social History Main Topics  . Smoking status: Former Smoker    Quit date: 05/31/1973  . Smokeless tobacco: Never Used  . Alcohol use No  . Drug use: No  . Sexual activity: Not Asked   Other Topics Concern  . None   Social History Narrative  . None     Review of Systems: A 12 point ROS discussed and pertinent positives are indicated in the HPI above.  All other systems are negative.  Review of Systems  Vital Signs: BP 95/81   Pulse 96   Temp 99.2 F (37.3 C) (Oral)   Resp 20   Ht 5\' 9"  (1.753 m)   Wt 160 lb 11.5 oz (72.9 kg)   SpO2 94%   BMI 23.73 kg/m   Physical Exam  Constitutional: He is oriented to person, place, and time. He appears well-developed and well-nourished. No distress.  HENT:  Head: Normocephalic.  Mouth/Throat: Oropharynx is clear and moist.  Neck: Normal range of motion. No tracheal deviation present.  Cardiovascular: Normal rate, regular rhythm and normal heart sounds.   Pulmonary/Chest: Effort normal and breath sounds normal. No respiratory distress. He has no wheezes. He has no rales.  Abdominal: Soft. He exhibits no mass. There is tenderness. There is no guarding.  Neurological: He is alert and oriented to person, place, and time.  Skin: Skin is warm and dry.  Psychiatric: He has a normal mood and affect. Judgment normal.    Mallampati Score:  MD Evaluation Airway: WNL Heart: WNL Abdomen: WNL Chest/ Lungs: WNL ASA  Classification: 3 Mallampati/Airway Score:  One  Imaging:  Ct Abdomen Pelvis W Contrast  Result Date: 05/02/2016 CLINICAL DATA:  Hemorrhagic pancreatitis with periumbilical pain. EXAM: CT ABDOMEN AND PELVIS WITH CONTRAST TECHNIQUE: Multidetector CT imaging of the abdomen and pelvis was performed using the standard protocol following bolus administration of intravenous contrast. CONTRAST:  75 cc of Isovue-300 IV COMPARISON:  04/22/2016 FINDINGS: Lower chest: There is bibasilar dependent atelectasis. Three-vessel coronary arteriosclerosis. No pericardial effusion. Heart size is within normal to the extent visible. There is aortic atherosclerosis of the descending aorta. There is a small hiatal hernia. Hepatobiliary: No space-occupying mass or abscess of the liver. Slight surface nodularity of the liver consistent cirrhosis. Gallstones are seen within a physiologically distended gallbladder. There is colonic interposition large bowel between the liver and ventral abdominal wall. Pancreas: There are multifocal abscesses within the phlegmonous inflammatory soft tissue mass outlining the pancreas which have  developed since the interim. There is an approximately 5.8 x 3.5 x 5.6 cm abscess immediately anterior to the third portion of the duodenum interposed between the duodenum and SMA, series 2, image 46, series 5, image 52. There is an approximately 5.9 x 4.2 x 4.2 cm right upper quadrant abscess just anterior to the junction of the second and third portion of the duodenum, series 2 image 40 all, series 5, image 44. There is an approximately 6.1 x 3.9 x 3.3 cm abscess with air just anterior to the distal third portion of the duodenum and overlying the SMA, series 2 image 35 and series 5 image 44. Smaller foci of air seen within the phlegmonous mass further cephalad anterior to the pancreatic head and midbody, series 2, image 30. Faint enhancement of the atretic pancreatic gland is seen within the phlegmonous mass. There appears to have been some necrosis of  portions of the pancreatic body and head since initial study from 04/03/2016. The portal and splenic veins remain patent with slight narrowing or mass-effect on the mid to distal splenic vein, series 2, image 32 . Spleen: Normal in size without focal abnormality. Adrenals/Urinary Tract: Stable appearance of the adrenal glands and kidneys without obstructive uropathy. Bladder is partially distended. Stomach/Bowel: There is mild sympathetic thickening of small intestine in the region of the pancreas. No bowel obstruction is noted. Vascular/Lymphatic: Aortoiliac and branch vessel atherosclerosis. The celiac vessels, SMA and IMA appear patent. Reproductive: Prostate is mildly enlarged up to 5.3 cm with peripheral zone calcifications. Other: Small amount of fluid is seen in the pelvis with trace edema along the pericolic gutters and adjacent to the liver. Musculoskeletal: Thoracolumbar degenerative disc disease consistent with spondylosis. No acute osseous abnormality. 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 Decrease in pancreatic gland enhancement consistent with probable areas of necrosis since the initial study. Patent splenic and portal veins and SMA. Splenic vein is slightly narrowed along its midportion from the phlegmonous mass. Critical Value/emergent results were called by telephone at the time of interpretation on 05/02/2016 at 3:22 pm to Dr. Raeford RazorSTEPHEN KOHUT , who verbally acknowledged these results. Electronically Signed   By: Tollie Ethavid  Kwon M.D.   On: 05/02/2016 15:22     Labs:  CBC:  Recent Labs  04/25/16 0305 05/01/16 05/02/16 1146 05/03/16 0242  WBC 16.6* 19.9 26.0* 23.6*  HGB 11.0* 11.9* 12.9* 11.0*  HCT 32.0* 35* 38.6* 33.9*  PLT 161 200 231 216    COAGS:  Recent Labs  03/27/16 0500 04/23/16 0507 05/03/16 0242  INR 1.23 1.39 1.43    BMP:  Recent Labs  04/27/16 0417  04/28/16 0445 05/01/16 05/02/16 1146 05/03/16 0242  NA 132* 131* 136* 133* 136  K 4.2 4.1 4.0 4.3 4.2  CL 104 100*  --  101 103  CO2 25 25  --  23 23  GLUCOSE 130* 133*  --  174* 108*  BUN 14 19 23* 23* 22*  CALCIUM 8.1* 8.4*  --  8.9 8.0*  CREATININE 0.68 0.67 0.7 0.91 0.81  GFRNONAA >60 >60  --  >60 >60  GFRAA >60 >60  --  >60 >60    LIVER FUNCTION TESTS:  Recent Labs  04/22/16 1554 04/23/16 0507 05/01/16 05/02/16 1146 05/03/16 0242  BILITOT 2.1* 1.7*  --  1.5* 1.3*  AST 26 21 22 27 23   ALT 34 27 20 25 20   ALKPHOS 70 56 86 88 76  PROT 7.5  5.7*  --  5.9* 5.1*  ALBUMIN 3.6 2.7*  --  2.3* 1.9*    TUMOR MARKERS: No results for input(s): AFPTM, CEA, CA199, CHROMGRNA in the last 8760 hours.  Assessment and Plan: Pancreatitis with evolving abscess/fluid collections For CT guided aspiration today Labs ok Risks and Benefits discussed with the patient including bleeding, infection, damage to adjacent structures, bowel perforation/fistula connection, and sepsis. All of the patient's questions were answered, patient is agreeable to proceed. Consent signed and in chart.    Thank you for this interesting consult.  I greatly enjoyed meeting Colin West and look forward to participating in their care.  A copy of this report was sent to the requesting provider on this date.  Electronically Signed: Brayton El 05/03/2016, 9:50 AM   I spent a total of 20 minutes in face to face in clinical consultation, greater than 50% of which was counseling/coordinating care for aspiration of pancreatic fluid collection

## 2016-05-03 NOTE — Sedation Documentation (Signed)
75 ml of drainage removed by MD.

## 2016-05-03 NOTE — Progress Notes (Addendum)
Initial Nutrition Assessment  DOCUMENTATION CODES:   Severe malnutrition in context of acute illness/injury  INTERVENTION:   Recommend NJ tube: Vital AF 1.2 start @ 20 ml/hr and advance by 10 ml every 12 hours to goal rate of 70 ml/hr Provides: 2016 kcal, 126 grams protein, and 1362 ml H2O.   Recommend a pon starting enteral nutrition therapy monitor magnesium, potassium, and phosphorus daily for at least 3 days, MD to replete as needed, as pt is at risk for refeeding syndrome given severe malnutrition.   NUTRITION DIAGNOSIS:   Malnutrition (Severe) related to acute illness (Pancreatitis) as evidenced by severe depletion of muscle mass, severe depletion of body fat, energy intake < or equal to 50% for > or equal to 5 days.  GOAL:   Patient will meet greater than or equal to 90% of their needs  MONITOR:   TF tolerance, Skin, I & O's, Labs  REASON FOR ASSESSMENT:   Consult New TPN/TNA  ASSESSMENT:   82 year old male with a history of HTN, DM diet-controlled, hyperlipidemia. Patient was recently admitted with hemorrhagic pancreatitis secondary to gallbladder from 10/8-10/12. Patient was readmitted on 11/7-11/13 due to severe sepsis secondary to E coli and [facility]. He's been continuing to become weak with nausea and vomiting and mild abdominal pain.   11/18 aspiration of peripancreatic fluid collection (70 cc) Spoke with pt who was sleepy and daughter at bedside. Per daughter pt had a full time job prior to October. Was admitted with gall stones in Oct 2017 and has gone down hill since. Over the past week daughter suspects pt has not ate anything. At rehab pt would get a meal tray but it would go back untouched due to lack of appetite and nausea.  Spoke with RN about recommendations.   Per surgery notes will consider NJ tube with enteral nutrition therapy prior to trying TPN.   Medications reviewed and include: novolog, creon,  Labs reviewed.   CBG: 116 Nutrition-Focused physical exam completed. Findings are severe fat depletion, severe muscle depletion, and unable to assess edema. Feet and ankles in boots.     Diet Order:  Diet NPO time specified Except for: Sips with Meds  Skin:  Wound (see comment) (MASD to buttocks, groin, and sacrum)  Last BM:  unknown  Height:   Ht Readings from Last 1 Encounters:  05/02/16 5\' 9"  (1.753 m)    Weight:   Wt Readings from Last 1 Encounters:  05/02/16 160 lb 11.5 oz (72.9 kg)    Ideal Body Weight:  72.7 kg  BMI:  Body mass index is 23.73 kg/m.  Estimated Nutritional Needs:   Kcal:  1900-2100  Protein:  100-125 grams  Fluid:  > 1.9 L/day  EDUCATION NEEDS:   No education needs identified at this time  Kendell BaneHeather Xhaiden Coombs RD, LDN, CNSC [phone] Pager [phone] After Hours Pager

## 2016-05-03 NOTE — Progress Notes (Addendum)
RN noticed pt HR in 130s-140.  EKG showed A fib RVR rate of 140.  Pt lying in bed watching tv.  Pt denies having any chest pain or discomfort. BP: 114/69, 142, 19, O2 sat 96% RA.  Daphane ShepherdM. Lynch, NP notified. Orders given for Cardizem drip. Will continue to monitor.

## 2016-05-03 NOTE — Evaluation (Signed)
Physical Therapy Evaluation Patient Details Name: Colin West N [place] MRN: [id number] DOB: November 18, 1933 Today's Date: 05/03/2016   History of Present Illness  Pt readmitted with pancreatic abcessess. Pt with 2 recent admissions for hemorrhagic gallstone pancreatitis and sepsis. PMH - HTN, DM  Clinical Impression  Pt admitted with above diagnosis and presents to PT with functional limitations due to deficits listed below (See PT problem list). Pt needs skilled PT to maximize independence and safety to allow discharge to SNF. Expect pt will need extended recovery time offered by SNF to hopefully eventually return home.     Follow Up Recommendations SNF    Equipment Recommendations  None recommended by PT    Recommendations for Other Services       Precautions / Restrictions Precautions Precautions: Fall Restrictions Weight Bearing Restrictions: No      Mobility  Bed Mobility Overal bed mobility: Needs Assistance Bed Mobility: Supine to Sit     Supine to sit: Mod assist     General bed mobility comments: Assist to elevate trunk and to bring hips to EOB  Transfers Overall transfer level: Needs assistance Equipment used: Rolling walker (2 wheeled) Transfers: Sit to/from Stand Sit to Stand: Mod assist         General transfer comment: Assist to bring hips up and for balance. Pt with posterior lean  Ambulation/Gait Ambulation/Gait assistance: Mod assist;+2 safety/equipment Ambulation Distance (Feet): 10 Feet Assistive device: Rolling walker (2 wheeled) Gait Pattern/deviations: Step-through pattern;Decreased step length - right;Decreased step length - left;Shuffle;Staggering right;Staggering left;Leaning posteriorly Gait velocity: decr Gait velocity interpretation: Below normal speed for age/gender General Gait Details: Assist for balance and support. Verbal cues to stay closer to the walker.  Stairs            Wheelchair Mobility    Modified Rankin (Stroke  Patients Only)       Balance Overall balance assessment: Needs assistance Sitting-balance support: No upper extremity supported;Feet supported Sitting balance-Leahy Scale: Fair     Standing balance support: Bilateral upper extremity supported Standing balance-Leahy Scale: Poor Standing balance comment: walker and initially mod A for static standing due to posterior lean. Progressed to min A                             Pertinent Vitals/Pain Pain Assessment: No/denies pain    Home Living Family/patient expects to be discharged to:: Skilled nursing facility Living Arrangements: Alone Available Help at Discharge: Family Type of Home: House Home Access: Stairs to enter   Secretary/administratorntrance Stairs-Number of Steps: 4 Home Layout: One level Home Equipment: Environmental consultantWalker - 2 wheels      Prior Function Level of Independence: Needs assistance   Gait / Transfers Assistance Needed: Assist to amb with walker at SNF. Prior to 1 month ago pt was independent without assistive device.           Hand Dominance        Extremity/Trunk Assessment   Upper Extremity Assessment: Generalized weakness           Lower Extremity Assessment: Generalized weakness         Communication   Communication: No difficulties  Cognition Arousal/Alertness: Awake/alert Behavior During Therapy: WFL for tasks assessed/performed Overall Cognitive Status: Within Functional Limits for tasks assessed                      General Comments      Exercises  Assessment/Plan    PT Assessment Patient needs continued PT services  PT Problem List Decreased strength;Decreased activity tolerance;Decreased balance;Decreased mobility;Decreased knowledge of use of DME          PT Treatment Interventions DME instruction;Gait training;Functional mobility training;Therapeutic activities;Therapeutic exercise;Balance training;Patient/family education    PT Goals (Current goals can be found in the  Care Plan section)  Acute Rehab PT Goals Patient Stated Goal: get better PT Goal Formulation: With patient/family Time For Goal Achievement: 05/17/16 Potential to Achieve Goals: Good    Frequency Min 3X/week   Barriers to discharge        Co-evaluation               End of Session Equipment Utilized During Treatment: Gait belt Activity Tolerance: Patient limited by fatigue Patient left: in chair;with call bell/phone within reach;with family/visitor present Nurse Communication: Mobility status         Time: 1535-1555 PT Time Calculation (min) (ACUTE ONLY): 20 min   Charges:   PT Evaluation $PT Eval Moderate Complexity: 1 Procedure     PT G CodesAngelina Ok:        Milee Qualls [facility] 05/03/2016, 4:23 PM [employer] CorporationCary Antavion Bartoszek PT [phone]

## 2016-05-03 NOTE — Progress Notes (Signed)
Pharmacy Antibiotic Note  Colin West is a 82 y.o. male admitted on 05/02/2016 with intra-abdominal infection.  Pharmacy has been consulted for Primaxin dosing.  Plan: -Primaxin 500mg  IV q6h -follow c/s, renal function, clinical progression  Height: 5\' 9"  (175.3 cm) Weight: 160 lb 11.5 oz (72.9 kg) IBW/kg (Calculated) : 70.7  Temp (24hrs), Avg:99.1 F (37.3 C), Min:98.7 F (37.1 C), Max:100.1 F (37.8 C)   Recent Labs Lab 04/27/16 0417 04/28/16 0445 05/01/16 05/02/16 1146 05/02/16 1224 05/02/16 1512 05/03/16 0242  WBC  --   --  19.9 26.0*  --   --  23.6*  CREATININE 0.68 0.67 0.7 0.91  --   --  0.81  LATICACIDVEN  --   --   --   --  2.28* 1.86  --     Estimated Creatinine Clearance: 72.7 mL/min (by C-G formula based on SCr of 0.81 mg/dL).    No Known Allergies   Antimicrobials this admission:  Vanc consult ordered but doses were never entered Zosyn 11/17 >> 11/18 Fluc 11/17>> Primaxin 11/18>>  Dose adjustments this admission:  N/a  Microbiology results:  11/17 BCx: ngtd 11/17 UCx: neg  11/18 Abscess from drain: sent 11/17 MRSA PCR: neg  Thank you for allowing pharmacy to be a part of this patient's care.  Colin West 05/03/2016 11:20 AM

## 2016-05-03 NOTE — Clinical Social Work Note (Signed)
CSW acknowledges SNF consult. CSW awaiting PT eval.  Charlynn CourtSarah Benedicta Sultan, CSW [phone]

## 2016-05-03 NOTE — Progress Notes (Signed)
Pt converted back to NSR in the 90's.  Orders given to stop Cardizem.

## 2016-05-04 LAB — CBC
HEMATOCRIT: 34.4 % — AB (ref 39.0–52.0)
HEMOGLOBIN: 11.1 g/dL — AB (ref 13.0–17.0)
MCH: 31 pg (ref 26.0–34.0)
MCHC: 32.3 g/dL (ref 30.0–36.0)
MCV: 96.1 fL (ref 78.0–100.0)
PLATELETS: 233 10*3/uL (ref 150–400)
RBC: 3.58 MIL/uL — AB (ref 4.22–5.81)
RDW: 14 % (ref 11.5–15.5)
WBC: 21 10*3/uL — AB (ref 4.0–10.5)

## 2016-05-04 LAB — COMPREHENSIVE METABOLIC PANEL
ALT: 19 U/L (ref 17–63)
ANION GAP: 8 (ref 5–15)
AST: 20 U/L (ref 15–41)
Albumin: 1.7 g/dL — ABNORMAL LOW (ref 3.5–5.0)
Alkaline Phosphatase: 76 U/L (ref 38–126)
BUN: 18 mg/dL (ref 6–20)
CHLORIDE: 105 mmol/L (ref 101–111)
CO2: 22 mmol/L (ref 22–32)
CREATININE: 0.77 mg/dL (ref 0.61–1.24)
Calcium: 8.1 mg/dL — ABNORMAL LOW (ref 8.9–10.3)
Glucose, Bld: 104 mg/dL — ABNORMAL HIGH (ref 65–99)
POTASSIUM: 3.8 mmol/L (ref 3.5–5.1)
Sodium: 135 mmol/L (ref 135–145)
Total Bilirubin: 1.4 mg/dL — ABNORMAL HIGH (ref 0.3–1.2)
Total Protein: 5 g/dL — ABNORMAL LOW (ref 6.5–8.1)

## 2016-05-04 LAB — GLUCOSE, CAPILLARY
Glucose-Capillary: 106 mg/dL — ABNORMAL HIGH (ref 65–99)
Glucose-Capillary: 116 mg/dL — ABNORMAL HIGH (ref 65–99)
Glucose-Capillary: 113 mg/dL — ABNORMAL HIGH (ref 65–99)
Glucose-Capillary: 102 mg/dL — ABNORMAL HIGH (ref 65–99)

## 2016-05-04 MED ORDER — DILTIAZEM HCL-DEXTROSE 100-5 MG/100ML-% IV SOLN (PREMIX): 5.0000 mg/h | INTRAVENOUS | Status: DC

## 2016-05-04 MED ORDER — PANTOPRAZOLE SODIUM 40 MG IV SOLR
40.0000 mg | Freq: Two times a day (BID) | INTRAVENOUS | Status: DC
  Administered 2016-05-04 – 2016-05-14 (×21): 40 mg via INTRAVENOUS
  Filled 2016-05-04 (×21): qty 40

## 2016-05-04 MED ORDER — CHLORHEXIDINE GLUCONATE 0.12 % MT SOLN
15.0000 mL | Freq: Two times a day (BID) | OROMUCOSAL | Status: DC
  Administered 2016-05-04 – 2016-05-21 (×32): 15 mL via OROMUCOSAL
  Filled 2016-05-04 (×25): qty 15

## 2016-05-04 MED ORDER — METOPROLOL TARTRATE 5 MG/5ML IV SOLN: 5.0000 mg | Freq: Four times a day (QID) | INTRAVENOUS | Status: DC | PRN

## 2016-05-04 MED ORDER — METOPROLOL TARTRATE 5 MG/5ML IV SOLN
5.0000 mg | Freq: Once | INTRAVENOUS | Status: AC
  Administered 2016-05-04: 5 mg via INTRAVENOUS
  Filled 2016-05-04: qty 5

## 2016-05-04 MED ORDER — PROMETHAZINE HCL 25 MG/ML IJ SOLN
12.5000 mg | Freq: Once | INTRAMUSCULAR | Status: AC
  Administered 2016-05-04: 12.5 mg via INTRAVENOUS
  Filled 2016-05-04: qty 1

## 2016-05-04 MED ORDER — ORAL CARE MOUTH RINSE
15.0000 mL | Freq: Two times a day (BID) | OROMUCOSAL | Status: DC
  Administered 2016-05-04 – 2016-05-21 (×27): 15 mL via OROMUCOSAL

## 2016-05-04 NOTE — Consult Note (Signed)
WOC Nurse wound consult note Reason for Consult: concern for pressure injuries to heels and buttocks Wound type:No pressure injuries noted.  Moisture associated skin damage, specifically Incontinence associated dermatitis with fungal over growth (severe) in the perineal area, bilateral inguinal, scrotal and medial thigh areas.  Peeling epidermis at buttocks. Red confluent presentation with satellite lesions. Patient started on diflucan 48 hours ago. Bilateral heels with blanching erythema, sacrum intact.  Pressure Ulcer POA: No Measurement: N/A Wound bed:As described above Drainage (amount, consistency, odor) None Periwound:As described above Dressing procedure/placement/frequency: Patient will today be provided with a therapeutic mattress with low air loss feature. Bilateral pressure redistribution heel boots provided.  Staff to turn and reposition side to side to maximize air flow to affected area, avoiding the supine position.  Oral diflucan has been initiated.  It might be prudent to use an indwelling urinary catheter for three days to keep urine off the skin and accelerate tissue repair, despite risks of CAUTI. If you agree, please order. WOC nursing team will not follow, but will remain available to this patient, the nursing and medical teams.  Please re-consult if needed. Thanks, Ladona MowLaurie Cliffard Hair, MSN, RN, GNP, Hans EdenCWOCN, CWON-AP, FAAN  Pager# [phone]

## 2016-05-04 NOTE — Progress Notes (Signed)
Subjective: Pt with copious bilious emesis this AM.  Very nauseated.  Had perc aspiration yesterday, gm stain GNR.   Corepak placed, tip in descending duodenum.    Objective: Vital signs in last 24 hours: Temp:  [97.8 F (36.6 C)-99.3 F (37.4 C)] 99.3 F (37.4 C) (11/19 0655) Pulse Rate:  [38-95] 92 (11/19 0600) Resp:  [19-25] 25 (11/19 0600) BP: (113-142)/(58-76) 142/69 (11/19 0600) SpO2:  [91 %-97 %] 91 % (11/19 0000) Last BM Date: 04/28/16  Intake/Output from previous day: 11/18 0701 - 11/19 0700 In: 2551.7 [P.O.:100; I.V.:1951.7; IV Piggyback:500] Out: 750 [Urine:750] Intake/Output this shift: No intake/output data recorded.  General appearance: alert, cooperative and moderate distress Resp: breathing comfortably GI: soft, non distended, non tender.   Bilious emesis all over patient CV - sinus tachycardia. HEENT:  Sclera anicteric, NCAT  Lab Results:   Recent Labs  05/03/16 0242 05/04/16 0355  WBC 23.6* 21.0*  HGB 11.0* 11.1*  HCT 33.9* 34.4*  PLT 216 233   BMET  Recent Labs  05/03/16 0242 05/04/16 0355  NA 136 135  K 4.2 3.8  CL 103 105  CO2 23 22  GLUCOSE 108* 104*  BUN 22* 18  CREATININE 0.81 0.77  CALCIUM 8.0* 8.1*   PT/INR  Recent Labs  05/03/16 0242  LABPROT 17.5*  INR 1.43   ABG No results for input(s): PHART, HCO3 in the last 72 hours.  Invalid input(s): PCO2, PO2  Studies/Results: X-ray Chest Pa And Lateral  Result Date: 05/03/2016 CLINICAL DATA:  Preoperative examination.  PICC placement. EXAM: CHEST  2 VIEW COMPARISON:  PA and lateral chest 05/02/2016. FINDINGS: New right PICC is in place with the tip near the superior cavoatrial junction. Lung volumes are low but the lungs are clear. Heart size is normal. No pneumothorax or pleural effusion. Remote right rib fractures are seen. Postoperative change right lung apex and right shoulder noted. IMPRESSION: No active cardiopulmonary disease. Electronically Signed   By: Drusilla Kanner M.D.   On: 05/03/2016 09:26   Dg Chest 2 View  Result Date: 05/02/2016 CLINICAL DATA:  Weakness and fever EXAM: CHEST  2 VIEW COMPARISON:  04/26/2016; 03/28/2016; 05/31/2013 ; CT abdomen pelvis - 04/22/2016 FINDINGS: Grossly unchanged cardiac silhouette and mediastinal contours with atherosclerotic plaque within thoracic aorta. Lung volumes were reduced. Stable postsurgical change of the right upper lung with volume loss and elevation involving the anterior aspect the right hemidiaphragm. Note is again made of a interposed loop of hepatic flexure of the colon below the right hemidiaphragm as seen on prior abdominal CT. Grossly unchanged mild diffuse slightly nodular thickening of the pulmonary interstitium. No discrete focal airspace opacities. No pleural effusion or pneumothorax. No evidence of edema. No acute osseus abnormalities. Post right rotator cuff repair. Degenerative change within the lower thoracic and upper lumbar spine. IMPRESSION: 1. Similar findings of decreased lung volumes without acute cardiopulmonary disease. No new focal airspace opacities to suggest pneumonia. 2. Stable postsurgical change of the right upper lung with associated volume loss. Electronically Signed   By: Simonne Come M.D.   On: 05/02/2016 13:27   Ct Abdomen Pelvis W Contrast  Result Date: 05/02/2016 CLINICAL DATA:  Hemorrhagic pancreatitis with periumbilical pain. EXAM: CT ABDOMEN AND PELVIS WITH CONTRAST TECHNIQUE: Multidetector CT imaging of the abdomen and pelvis was performed using the standard protocol following bolus administration of intravenous contrast. CONTRAST:  75 cc of Isovue-300 IV COMPARISON:  04/22/2016 FINDINGS: Lower chest: There is bibasilar dependent atelectasis. Three-vessel coronary arteriosclerosis. No  pericardial effusion. Heart size is within normal to the extent visible. There is aortic atherosclerosis of the descending aorta. There is a small hiatal hernia. Hepatobiliary: No  space-occupying mass or abscess of the liver. Slight surface nodularity of the liver consistent cirrhosis. Gallstones are seen within a physiologically distended gallbladder. There is colonic interposition large bowel between the liver and ventral abdominal wall. Pancreas: There are multifocal abscesses within the phlegmonous inflammatory soft tissue mass outlining the pancreas which have developed since the interim. There is an approximately 5.8 x 3.5 x 5.6 cm abscess immediately anterior to the third portion of the duodenum interposed between the duodenum and SMA, series 2, image 46, series 5, image 52. There is an approximately 5.9 x 4.2 x 4.2 cm right upper quadrant abscess just anterior to the junction of the second and third portion of the duodenum, series 2 image 40 all, series 5, image 44. There is an approximately 6.1 x 3.9 x 3.3 cm abscess with air just anterior to the distal third portion of the duodenum and overlying the SMA, series 2 image 35 and series 5 image 44. Smaller foci of air seen within the phlegmonous mass further cephalad anterior to the pancreatic head and midbody, series 2, image 30. Faint enhancement of the atretic pancreatic gland is seen within the phlegmonous mass. There appears to have been some necrosis of portions of the pancreatic body and head since initial study from 04/03/2016. The portal and splenic veins remain patent with slight narrowing or mass-effect on the mid to distal splenic vein, series 2, image 32 . Spleen: Normal in size without focal abnormality. Adrenals/Urinary Tract: Stable appearance of the adrenal glands and kidneys without obstructive uropathy. Bladder is partially distended. Stomach/Bowel: There is mild sympathetic thickening of small intestine in the region of the pancreas. No bowel obstruction is noted. Vascular/Lymphatic: Aortoiliac and branch vessel atherosclerosis. The celiac vessels, SMA and IMA appear patent. Reproductive: Prostate is mildly enlarged  up to 5.3 cm with peripheral zone calcifications. Other: Small amount of fluid is seen in the pelvis with trace edema along the pericolic gutters and adjacent to the liver. Musculoskeletal: Thoracolumbar degenerative disc disease consistent with spondylosis. No acute osseous abnormality. 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 Decrease in pancreatic gland enhancement consistent with probable areas of necrosis since the initial study. Patent splenic and portal veins and SMA. Splenic vein is slightly narrowed along its midportion from the phlegmonous mass. Critical Value/emergent results were called by telephone at the time of interpretation on 05/02/2016 at 3:22 pm to Dr. Raeford RazorSTEPHEN KOHUT , who verbally acknowledged these results. Electronically Signed   By: Tollie Ethavid  Kwon M.D.   On: 05/02/2016 15:22   Ct Aspiration  Result Date: 05/03/2016 INDICATION: Pancreatitis with peripancreatic abscess/pseudocyst. Please perform CT-guided aspiration for the acquisition of a sample for laboratory analysis. EXAM: CT GUIDANCE NEEDLE PLACEMENT COMPARISON:  CT abdomen pelvis - 05/02/2016 ; 04/22/2016 MEDICATIONS: The patient is currently admitted to the hospital and receiving intravenous antibiotics. The antibiotics were administered within an appropriate time frame prior to the initiation of the procedure. ANESTHESIA/SEDATION: Moderate (conscious) sedation was employed during this procedure. A total of Versed 1 mg and Fentanyl 75 mcg was administered intravenously. Moderate Sedation Time: 15 minutes. The patient's level of consciousness and vital signs were monitored continuously by radiology nursing throughout the procedure under my direct supervision. CONTRAST:  None COMPLICATIONS: None immediate. PROCEDURE: Informed written consent was obtained from the patient  after a discussion of the risks, benefits and alternatives to  treatment. The patient was placed supine, slightly RPO on the CT gantry and a pre procedural CT was performed re-demonstrating the known ill-defined abscess/fluid collection about the pancreas. The procedure was planned. A timeout was performed prior to the initiation of the procedure. The skin overlying the left lateral abdomen was prepped and draped in the usual sterile fashion. The overlying soft tissues were anesthetized with 1% lidocaine with epinephrine. Appropriate trajectory was planned with the use of a 22 gauge spinal needle. An 18 gauge trocar needle was advanced into the abscess/fluid collection and a short Amplatz super stiff wire was coiled within the collection. Appropriate positioning was confirmed with a limited CT scan. The tract was dilated allowing placement of a temporary 8 [place] all-purpose drainage catheter. Approximately 75 cc of purulent fluid was aspirated as the 8 French drainage catheter was slowly retracted. A dressing was placed. The patient tolerated the procedure well without immediate post procedural complication. IMPRESSION: Successful CT guided aspiration of approximately 75 cc of purulent fluid from the peripancreatic fluid collection/pseudocyst. Samples were sent to the laboratory as requested by the ordering clinical team. Electronically Signed   By: Simonne ComeJohn  Watts M.D.   On: 05/03/2016 11:50   Dg Abd Portable 1v  Result Date: 05/03/2016 CLINICAL DATA:  Enteric tube placement. EXAM: PORTABLE ABDOMEN - 1 VIEW COMPARISON:  05/03/2016 CT FINDINGS: A small bore feeding tube is identified with tip overlying the descending duodenum. The bowel gas pattern is unremarkable. No acute bony abnormalities are identified. IMPRESSION: Small bore feeding tube with tip overlying the descending duodenum. Electronically Signed   By: Harmon PierJeffrey  Hu M.D.   On: 05/03/2016 17:39    Anti-infectives: Anti-infectives    Start     Dose/Rate Route Frequency Ordered Stop   05/03/16 1200   imipenem-cilastatin (PRIMAXIN) 500 mg in sodium chloride 0.9 % 100 mL IVPB     500 mg 200 mL/hr over 30 Minutes Intravenous Every 6 hours 05/03/16 1122     05/03/16 0030  piperacillin-tazobactam (ZOSYN) IVPB 3.375 g  Status:  Discontinued     3.375 g 12.5 mL/hr over 240 Minutes Intravenous Every 8 hours 05/02/16 1647 05/03/16 1101   05/02/16 1930  fluconazole (DIFLUCAN) tablet 100 mg     100 mg Oral Daily 05/02/16 1816     05/02/16 1600  piperacillin-tazobactam (ZOSYN) IVPB 3.375 g     3.375 g 100 mL/hr over 30 Minutes Intravenous  Once 05/02/16 1548 05/02/16 1720      Assessment/Plan: s/p * No surgery found * gallstone pancreatitis  Pancreatic abscess Severe protein calorie malnutrition E coli pyelonephritis weakness Sacral ulcer CHF   Pt will likely have issues getting tube feeds to goal and in fact would give him time for nausea to resolve. If no improvement by tomorrow, would start TNA while getting tube feeds to goal. Nausea most likely secondary to pancreatitis/abscess. Will need gallbladder out, but pt has barriers to overcome.   Will need improved nutrition, improvement/resolution of pancreatitis, and cards has recommended stress test.    Await cultures Continue primaxin/fluconazole. Will follow.       LOS: 2 days    [facility] Surgery CenterBYERLY,Farouk Vivero 05/04/2016

## 2016-05-04 NOTE — Progress Notes (Signed)
PROGRESS NOTE  Colin West  ZOX:[id number] DOB: Dec 12, 1933 DOA: 05/02/2016 PCP: [name], MD Outpatient Specialists:  Subjective: Patient did not remember that I saw him yesterday or why he is in the hospital. Had transient A. fib run earlier today.  Brief Narrative:  Colin West is a 82 y.o. male  with complex history recently hospitalized from 10/8-10/12 for hemorrhagic pancreatitis from gallblader source, and on  11/7-11/13 for Severe Sepsis due to E.Coli pyelonephritis, discharged to  [facility] on oral antibiotics, brought to the emergency department with worsening generalized weakness, today  evere enough with  associated nausea and vomiting. He denies shortness of breath or productive cough. He has been unable to ambulate due to weakness. He denies any abdominal pain, he has occasional nausea without vomiting, he denies any diarrhea or constipation. He has not been able to eat a normal meal, lost about 15 lbs in the last 2 months.  He reports frequent hiccups. He denies any lower extremity swelling. Due to prolonged bed rest, he has developed some the ulcers in the sacrum and gluteal  areas as well as some minimal heel blanching hese is being cared at the facility as well. No bleeding issues are reported such as hemoptysis, cemetery messages, hematuria, or hematochezia  Assessment & Plan:   Principal Problem:   Pancreatic abscess Active Problems:   Essential hypertension   Hyperbilirubinemia   Malnutrition of moderate degree   Leukocytosis   History of acute pancreatitis   Congestive dilated cardiomyopathy (HCC)   Tinea cruris   Sacral decubitus ulcer, stage II   Multiple Pancreatic Abscesses -Presented to the hospital with abdominal pain, nausea and loss of appetite. -CT scan showed multiple peripancreatic abscesses, was recently in the hospital from 10/8-10/12 for hemorrhagic pancreatitis from gallbladder source. -Had low-grade fever of 100.1, nontoxic  without tachycardia or tachypnea. -Started on vancomycin and Zosyn, will switch to Primaxin per general surgery recommendations. -Continue IV fluids.  Protein calorie malnutrition, severe  -Lost 20 pounds in the past 2 months, albumin is 2.3. Prealbumin is <5 -Per general surgery recommendation try postpyloric enteral nutrition prior to TPN. -CORTRAK tube in the duodenum, hopefully the peristalsis will advance the tube.  Atrial fibrillation, transient -Had run of atrial fibrillation with heart rate in the 130s, back to sinus rhythm with IV metoprolol. -Not sure if he needs anticoagulation for transient transient A. fib. -Check TSH and keep on scheduled metoprolol.  Type II Diabetes Current blood sugar level is 174 Currently nothing by mouth, per general surgery once into postpyloric feeding. Await their recommendations.   Hypertension BP 125/65   Pulse 86   Controlled Continue home anti-hypertensive medications    Hiccups This is likely secondary to the irritation of the diaphragm from pancreatic abscess, on Reglan.  Hyperlipidemia Continue home statins   Dilated Cardiomyopathy in view of recent sepsis 2 D echo recently shows EF 30-35 % with Gr 1 DD Will be very careful with IV fluids.  ?Dementia -Patient was not able to remember what I told him yesterday. Did not remember why he is in the hospital.    DVT prophylaxis:  Code Status: Full Code Family Communication:  Disposition Plan:  Diet: Diet NPO time specified Except for: Sips with Meds  Consultants:   Gen. surgery.  Interventional radiology  Procedures:   Aspiration of abscess on 05/04/2016  Antimicrobials:   Zosyn and vancomycin changed to Primaxin on 05/03/2016  Objective: Vitals:   05/04/16 0000 05/04/16 0400 05/04/16 0600 05/04/16 8119  BP: 118/73  (!) 142/69   Pulse: (!) 38  92   Resp: (!) 21  (!) 25   Temp: 98.1 F (36.7 C) 98.1 F (36.7 C)  99.3 F (37.4 C)  TempSrc: Oral Oral   Oral  SpO2: 91%     Weight:      Height:        Intake/Output Summary (Last 24 hours) at 05/04/16 1039 Last data filed at 05/04/16 0600  Gross per 24 hour  Intake          2301.67 ml  Output              500 ml  Net          1801.67 ml   Filed Weights   05/02/16 1835  Weight: 72.9 kg (160 lb 11.5 oz)    Examination: General exam: Appears calm and comfortable  Respiratory system: Clear to auscultation. Respiratory effort normal. Cardiovascular system: S1 & S2 heard, RRR. No JVD, murmurs, rubs, gallops or clicks. No pedal edema. Gastrointestinal system: Abdomen is nondistended, soft and nontender. No organomegaly or masses felt. Normal bowel sounds heard. Central nervous system: Alert and oriented. No focal neurological deficits. Extremities: Symmetric 5 x 5 power. Skin: No rashes, lesions or ulcers Psychiatry: Judgement and insight appear normal. Mood & affect appropriate.   Data Reviewed: I have personally reviewed following labs and imaging studies  CBC:  Recent Labs Lab 05/01/16 05/02/16 1146 05/03/16 0242 05/04/16 0355  WBC 19.9 26.0* 23.6* 21.0*  NEUTROABS  --  22.3*  --   --   HGB 11.9* 12.9* 11.0* 11.1*  HCT 35* 38.6* 33.9* 34.4*  MCV  --  96.3 96.0 96.1  PLT 200 231 216 233   Basic Metabolic Panel:  Recent Labs Lab 04/28/16 0445 05/01/16 05/02/16 1146 05/03/16 0242 05/04/16 0355  NA 131* 136* 133* 136 135  K 4.1 4.0 4.3 4.2 3.8  CL 100*  --  101 103 105  CO2 25  --  23 23 22   GLUCOSE 133*  --  174* 108* 104*  BUN 19 23* 23* 22* 18  CREATININE 0.67 0.7 0.91 0.81 0.77  CALCIUM 8.4*  --  8.9 8.0* 8.1*  MG 1.8  --   --   --   --    GFR: Estimated Creatinine Clearance: 73.6 mL/min (by C-G formula based on SCr of 0.77 mg/dL). Liver Function Tests:  Recent Labs Lab 05/01/16 05/02/16 1146 05/03/16 0242 05/04/16 0355  AST 22 27 23 20   ALT 20 25 20 19   ALKPHOS 86 88 76 76  BILITOT  --  1.5* 1.3* 1.4*  PROT  --  5.9* 5.1* 5.0*  ALBUMIN  --  2.3*  1.9* 1.7*    Recent Labs Lab 05/02/16 1729  LIPASE 18   No results for input(s): AMMONIA in the last 168 hours. Coagulation Profile:  Recent Labs Lab 05/03/16 0242  INR 1.43   Cardiac Enzymes:  Recent Labs Lab 05/02/16 1729 05/02/16 2305 05/03/16 0242  TROPONINI 0.03* 0.04* 0.03*   BNP (last 3 results) No results for input(s): PROBNP in the last 8760 hours. HbA1C: No results for input(s): HGBA1C in the last 72 hours. CBG:  Recent Labs Lab 05/03/16 0745 05/03/16 1144 05/03/16 1650 05/03/16 2102 05/04/16 0759  GLUCAP 116* 118* 102* 112* 106*   Lipid Profile: No results for input(s): CHOL, HDL, LDLCALC, TRIG, CHOLHDL, LDLDIRECT in the last 72 hours. Thyroid Function Tests: No results for input(s): TSH, T4TOTAL, FREET4, T3FREE,  THYROIDAB in the last 72 hours. Anemia Panel: No results for input(s): VITAMINB12, FOLATE, FERRITIN, TIBC, IRON, RETICCTPCT in the last 72 hours. Urine analysis:    Component Value Date/Time   COLORURINE AMBER (A) 05/02/2016 1149   APPEARANCEUR CLOUDY (A) 05/02/2016 1149   LABSPEC 1.024 05/02/2016 1149   PHURINE 6.0 05/02/2016 1149   GLUCOSEU NEGATIVE 05/02/2016 1149   HGBUR NEGATIVE 05/02/2016 1149   BILIRUBINUR SMALL (A) 05/02/2016 1149   KETONESUR NEGATIVE 05/02/2016 1149   PROTEINUR NEGATIVE 05/02/2016 1149   UROBILINOGEN 0.2 03/08/2010 1026   NITRITE NEGATIVE 05/02/2016 1149   LEUKOCYTESUR SMALL (A) 05/02/2016 1149   Sepsis Labs: @LABRCNTIP (procalcitonin:4,lacticidven:4)  ) Recent Results (from the past 240 hour(s))  Culture, blood (Routine x 2)     Status: None (Preliminary result)   Collection Time: 05/02/16 11:45 AM  Result Value Ref Range Status   Specimen Description BLOOD LEFT ANTECUBITAL  Final   Special Requests BOTTLES DRAWN AEROBIC AND ANAEROBIC 5CC  Final   Culture NO GROWTH < 24 HOURS  Final   Report Status PENDING  Incomplete  Urine culture     Status: None   Collection Time: 05/02/16 11:49 AM  Result  Value Ref Range Status   Specimen Description URINE, CLEAN CATCH  Final   Special Requests NONE  Final   Culture NO GROWTH  Final   Report Status 05/03/2016 FINAL  Final  Culture, blood (Routine x 2)     Status: None (Preliminary result)   Collection Time: 05/02/16 12:40 PM  Result Value Ref Range Status   Specimen Description BLOOD RIGHT ANTECUBITAL  Final   Special Requests BOTTLES DRAWN AEROBIC AND ANAEROBIC  Final   Culture NO GROWTH < 24 HOURS  Final   Report Status PENDING  Incomplete  MRSA PCR Screening     Status: None   Collection Time: 05/02/16  6:32 PM  Result Value Ref Range Status   MRSA by PCR NEGATIVE NEGATIVE Final    Comment:        The GeneXpert MRSA Assay (FDA approved for NASAL specimens only), is one component of a comprehensive MRSA colonization surveillance program. It is not intended to diagnose MRSA infection nor to guide or monitor treatment for MRSA infections.   Aerobic/Anaerobic Culture (surgical/deep wound)     Status: None (Preliminary result)   Collection Time: 05/03/16  2:10 PM  Result Value Ref Range Status   Specimen Description DRAINAGE  Final   Special Requests Normal  Final   Gram Stain   Final    ABUNDANT WBC PRESENT, PREDOMINANTLY PMN MODERATE GRAM NEGATIVE RODS    Culture PENDING  Incomplete   Report Status PENDING  Incomplete     Invalid input(s): PROCALCITONIN, LACTICACIDVEN   Radiology Studies: X-ray Chest Pa And Lateral  Result Date: 05/03/2016 CLINICAL DATA:  Preoperative examination.  PICC placement. EXAM: CHEST  2 VIEW COMPARISON:  PA and lateral chest 05/02/2016. FINDINGS: New right PICC is in place with the tip near the superior cavoatrial junction. Lung volumes are low but the lungs are clear. Heart size is normal. No pneumothorax or pleural effusion. Remote right rib fractures are seen. Postoperative change right lung apex and right shoulder noted. IMPRESSION: No active cardiopulmonary disease. Electronically  Signed   By: Drusilla Kanner M.D.   On: 05/03/2016 09:26   Dg Chest 2 View  Result Date: 05/02/2016 CLINICAL DATA:  Weakness and fever EXAM: CHEST  2 VIEW COMPARISON:  04/26/2016; 03/28/2016; 05/31/2013 ; CT abdomen pelvis -  04/22/2016 FINDINGS: Grossly unchanged cardiac silhouette and mediastinal contours with atherosclerotic plaque within thoracic aorta. Lung volumes were reduced. Stable postsurgical change of the right upper lung with volume loss and elevation involving the anterior aspect the right hemidiaphragm. Note is again made of a interposed loop of hepatic flexure of the colon below the right hemidiaphragm as seen on prior abdominal CT. Grossly unchanged mild diffuse slightly nodular thickening of the pulmonary interstitium. No discrete focal airspace opacities. No pleural effusion or pneumothorax. No evidence of edema. No acute osseus abnormalities. Post right rotator cuff repair. Degenerative change within the lower thoracic and upper lumbar spine. IMPRESSION: 1. Similar findings of decreased lung volumes without acute cardiopulmonary disease. No new focal airspace opacities to suggest pneumonia. 2. Stable postsurgical change of the right upper lung with associated volume loss. Electronically Signed   By: Simonne Come M.D.   On: 05/02/2016 13:27   Ct Abdomen Pelvis W Contrast  Result Date: 05/02/2016 CLINICAL DATA:  Hemorrhagic pancreatitis with periumbilical pain. EXAM: CT ABDOMEN AND PELVIS WITH CONTRAST TECHNIQUE: Multidetector CT imaging of the abdomen and pelvis was performed using the standard protocol following bolus administration of intravenous contrast. CONTRAST:  75 cc of Isovue-300 IV COMPARISON:  04/22/2016 FINDINGS: Lower chest: There is bibasilar dependent atelectasis. Three-vessel coronary arteriosclerosis. No pericardial effusion. Heart size is within normal to the extent visible. There is aortic atherosclerosis of the descending aorta. There is a small hiatal hernia.  Hepatobiliary: No space-occupying mass or abscess of the liver. Slight surface nodularity of the liver consistent cirrhosis. Gallstones are seen within a physiologically distended gallbladder. There is colonic interposition large bowel between the liver and ventral abdominal wall. Pancreas: There are multifocal abscesses within the phlegmonous inflammatory soft tissue mass outlining the pancreas which have developed since the interim. There is an approximately 5.8 x 3.5 x 5.6 cm abscess immediately anterior to the third portion of the duodenum interposed between the duodenum and SMA, series 2, image 46, series 5, image 52. There is an approximately 5.9 x 4.2 x 4.2 cm right upper quadrant abscess just anterior to the junction of the second and third portion of the duodenum, series 2 image 40 all, series 5, image 44. There is an approximately 6.1 x 3.9 x 3.3 cm abscess with air just anterior to the distal third portion of the duodenum and overlying the SMA, series 2 image 35 and series 5 image 44. Smaller foci of air seen within the phlegmonous mass further cephalad anterior to the pancreatic head and midbody, series 2, image 30. Faint enhancement of the atretic pancreatic gland is seen within the phlegmonous mass. There appears to have been some necrosis of portions of the pancreatic body and head since initial study from 04/03/2016. The portal and splenic veins remain patent with slight narrowing or mass-effect on the mid to distal splenic vein, series 2, image 32 . Spleen: Normal in size without focal abnormality. Adrenals/Urinary Tract: Stable appearance of the adrenal glands and kidneys without obstructive uropathy. Bladder is partially distended. Stomach/Bowel: There is mild sympathetic thickening of small intestine in the region of the pancreas. No bowel obstruction is noted. Vascular/Lymphatic: Aortoiliac and branch vessel atherosclerosis. The celiac vessels, SMA and IMA appear patent. Reproductive: Prostate  is mildly enlarged up to 5.3 cm with peripheral zone calcifications. Other: Small amount of fluid is seen in the pelvis with trace edema along the pericolic gutters and adjacent to the liver. Musculoskeletal: Thoracolumbar degenerative disc disease consistent with spondylosis. No acute osseous abnormality.  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 Decrease in pancreatic gland enhancement consistent with probable areas of necrosis since the initial study. Patent splenic and portal veins and SMA. Splenic vein is slightly narrowed along its midportion from the phlegmonous mass. Critical Value/emergent results were called by telephone at the time of interpretation on 05/02/2016 at 3:22 pm to Dr. Raeford Razor , who verbally acknowledged these results. Electronically Signed   By: Tollie Eth M.D.   On: 05/02/2016 15:22   Ct Aspiration  Result Date: 05/03/2016 INDICATION: Pancreatitis with peripancreatic abscess/pseudocyst. Please perform CT-guided aspiration for the acquisition of a sample for laboratory analysis. EXAM: CT GUIDANCE NEEDLE PLACEMENT COMPARISON:  CT abdomen pelvis - 05/02/2016 ; 04/22/2016 MEDICATIONS: The patient is currently admitted to the hospital and receiving intravenous antibiotics. The antibiotics were administered within an appropriate time frame prior to the initiation of the procedure. ANESTHESIA/SEDATION: Moderate (conscious) sedation was employed during this procedure. A total of Versed 1 mg and Fentanyl 75 mcg was administered intravenously. Moderate Sedation Time: 15 minutes. The patient's level of consciousness and vital signs were monitored continuously by radiology nursing throughout the procedure under my direct supervision. CONTRAST:  None COMPLICATIONS: None immediate. PROCEDURE: Informed written consent was obtained from the patient after a discussion of the risks, benefits and  alternatives to treatment. The patient was placed supine, slightly RPO on the CT gantry and a pre procedural CT was performed re-demonstrating the known ill-defined abscess/fluid collection about the pancreas. The procedure was planned. A timeout was performed prior to the initiation of the procedure. The skin overlying the left lateral abdomen was prepped and draped in the usual sterile fashion. The overlying soft tissues were anesthetized with 1% lidocaine with epinephrine. Appropriate trajectory was planned with the use of a 22 gauge spinal needle. An 18 gauge trocar needle was advanced into the abscess/fluid collection and a short Amplatz super stiff wire was coiled within the collection. Appropriate positioning was confirmed with a limited CT scan. The tract was dilated allowing placement of a temporary 8 [place] all-purpose drainage catheter. Approximately 75 cc of purulent fluid was aspirated as the 8 French drainage catheter was slowly retracted. A dressing was placed. The patient tolerated the procedure well without immediate post procedural complication. IMPRESSION: Successful CT guided aspiration of approximately 75 cc of purulent fluid from the peripancreatic fluid collection/pseudocyst. Samples were sent to the laboratory as requested by the ordering clinical team. Electronically Signed   By: Simonne Come M.D.   On: 05/03/2016 11:50   Dg Abd Portable 1v  Result Date: 05/03/2016 CLINICAL DATA:  Enteric tube placement. EXAM: PORTABLE ABDOMEN - 1 VIEW COMPARISON:  05/03/2016 CT FINDINGS: A small bore feeding tube is identified with tip overlying the descending duodenum. The bowel gas pattern is unremarkable. No acute bony abnormalities are identified. IMPRESSION: Small bore feeding tube with tip overlying the descending duodenum. Electronically Signed   By: Harmon Pier M.D.   On: 05/03/2016 17:39        Scheduled Meds: . atorvastatin  10 mg Oral q1800  . chlorhexidine  15 mL Mouth Rinse BID  .  famotidine (PEPCID) IV  20 mg Intravenous Q12H  . fluconazole  100 mg Oral Daily  . imipenem-cilastatin  500 mg Intravenous Q6H  . insulin aspart  0-9 Units Subcutaneous TID WC  . lipase/protease/amylase  36,000 Units Oral TID AC  . lisinopril  2.5 mg Oral Daily  . mouth rinse  15 mL Mouth Rinse q12n4p  . metoprolol  100 mg Oral BID  . sodium chloride flush  10-40 mL Intracatheter Q12H  . sodium chloride flush  3 mL Intravenous Q12H   Continuous Infusions: . sodium chloride 75 mL/hr at 05/04/16 0045  . diltiazem (CARDIZEM) infusion       LOS: 2 days    Time spent: 35 minutes    Danai Gotto A, MD [facility] Pager [phone]  If 7PM-7AM, please contact night-coverage [email or web address] Password [email or web address] 05/04/2016, 10:39 AM

## 2016-05-04 NOTE — Progress Notes (Signed)
Informed Dr. Arthor CaptainElmahi that patient is vomiting every time he is taking medication. Informed MD Zofran given but patient is requesting something for indigestion. Informed MD that patient is on 20 mg IV Pepcid bid. MD ordered to discontinue IV Pepcid and order 40 mg IV Protonix bid instead.

## 2016-05-04 NOTE — Progress Notes (Addendum)
Pt began having irregular heart rhythm with rate going up to the mid 120s. Notified MD pt not complaining of any symptoms. Heart rate then going between 80s-120s up to 128, EKG done showing Afib with RVR new onset in this hospitalization. MD notified again. Dr. Katrinka BlazingSmith called back to state to monitor pt and call if rate gets into 130s. No treatment ordered at this time, will continue to monitor and call back if needed

## 2016-05-04 NOTE — Progress Notes (Signed)
Pt HR converted back to NSR with rate in the 90s. MD made aware and asked to discontinue cardizem gtt. Will talk with MDs in morning of adjusting lopressor or changing to cardizem PO.

## 2016-05-04 NOTE — Progress Notes (Signed)
Pt given 2 doses of lopressor IV 5mg  with no change in hr. Pts HR sustaining at 130s now, pt has no complaints and is currently sleeping without distress. MD made aware and asked to start cardizem gtt without laoding bolus.

## 2016-05-05 LAB — COMPREHENSIVE METABOLIC PANEL
ALT: 17 U/L (ref 17–63)
AST: 24 U/L (ref 15–41)
Albumin: 1.7 g/dL — ABNORMAL LOW (ref 3.5–5.0)
Alkaline Phosphatase: 73 U/L (ref 38–126)
Anion gap: 9 (ref 5–15)
BUN: 24 mg/dL — AB (ref 6–20)
CALCIUM: 8.1 mg/dL — AB (ref 8.9–10.3)
CO2: 22 mmol/L (ref 22–32)
CREATININE: 0.86 mg/dL (ref 0.61–1.24)
Chloride: 106 mmol/L (ref 101–111)
GFR calc non Af Amer: >60 mL/min (ref >60)
GLUCOSE: 112 mg/dL — AB (ref 65–99)
Potassium: 3.6 mmol/L (ref 3.5–5.1)
SODIUM: 137 mmol/L (ref 135–145)
TOTAL PROTEIN: 4.9 g/dL — AB (ref 6.5–8.1)
Total Bilirubin: 1.4 mg/dL — ABNORMAL HIGH (ref 0.3–1.2)

## 2016-05-05 LAB — CBC
HEMATOCRIT: 33.2 % — AB (ref 39.0–52.0)
HEMOGLOBIN: 11 g/dL — AB (ref 13.0–17.0)
MCH: 31.6 pg (ref 26.0–34.0)
MCHC: 33.1 g/dL (ref 30.0–36.0)
MCV: 95.4 fL (ref 78.0–100.0)
Platelets: 223 10*3/uL (ref 150–400)
RBC: 3.48 MIL/uL — ABNORMAL LOW (ref 4.22–5.81)
RDW: 13.9 % (ref 11.5–15.5)
WBC: 18.2 10*3/uL — ABNORMAL HIGH (ref 4.0–10.5)

## 2016-05-05 LAB — GLUCOSE, CAPILLARY
GLUCOSE-CAPILLARY: 104 mg/dL — AB (ref 65–99)
Glucose-Capillary: 126 mg/dL — ABNORMAL HIGH (ref 65–99)
Glucose-Capillary: 117 mg/dL — ABNORMAL HIGH (ref 65–99)
Glucose-Capillary: 172 mg/dL — ABNORMAL HIGH (ref 65–99)

## 2016-05-05 LAB — TSH: TSH: 2.319 u[IU]/mL (ref 0.350–4.500)

## 2016-05-05 MED ORDER — SODIUM CHLORIDE 0.9 % IV SOLN
INTRAVENOUS | Status: DC
  Administered 2016-05-05 – 2016-05-18 (×3): via INTRAVENOUS

## 2016-05-05 MED ORDER — FAT EMULSION 20 % IV EMUL
120.0000 mL | INTRAVENOUS | Status: AC
  Administered 2016-05-05: 120 mL via INTRAVENOUS
  Filled 2016-05-05: qty 200

## 2016-05-05 MED ORDER — FLUCONAZOLE IN SODIUM CHLORIDE 100-0.9 MG/50ML-% IV SOLN
100.0000 mg | INTRAVENOUS | Status: DC
  Administered 2016-05-05 – 2016-05-15 (×11): 100 mg via INTRAVENOUS
  Filled 2016-05-05 (×15): qty 50

## 2016-05-05 MED ORDER — TRACE MINERALS CR-CU-MN-SE-ZN 10-1000-500-60 MCG/ML IV SOLN
INTRAVENOUS | Status: AC
  Administered 2016-05-05: 17:00:00 via INTRAVENOUS
  Filled 2016-05-05: qty 960

## 2016-05-05 MED ORDER — ALUM & MAG HYDROXIDE-SIMETH 200-200-20 MG/5ML PO SUSP
15.0000 mL | ORAL | Status: DC | PRN
  Administered 2016-05-05 – 2016-05-20 (×12): 15 mL via ORAL
  Filled 2016-05-05 (×12): qty 30

## 2016-05-05 MED ORDER — INSULIN ASPART 100 UNIT/ML ~~LOC~~ SOLN
0.0000 [IU] | SUBCUTANEOUS | Status: DC
  Administered 2016-05-05 – 2016-05-06 (×4): 2 [IU] via SUBCUTANEOUS

## 2016-05-05 NOTE — Progress Notes (Signed)
PHARMACY - ADULT TOTAL PARENTERAL NUTRITION CONSULT NOTE   Pharmacy Consult for TPN Indication: Intolerance of enteral feeding  Patient Measurements: Height: 5\' 9"  (175.3 cm) Weight: 160 lb 11.5 oz (72.9 kg) IBW/kg (Calculated) : 70.7 TPN AdjBW (KG): 72.9 Body mass index is 23.73 kg/m.   Assessment:  82yo male admitted 10/8-10/12 with hemorrhagic pancreatitis 2nd GB, readmitted 11/7-11/13 with severe EColi sepsis, and readmitted 11/17 with weakness, N/V, and abdominal pain.  CT(+) intra-abdominal abscesses, WBC 26.  He has probable infected pancreatitic necrosis & will likely require surgery.  Pt has lost 20lb in the last 2 months, severe malnutrition per RD assessment.  A [facility] team placed a post-pyloric TF access on 11/18, which was in descending duodenum with hope to advance via peristalsis.  Pt with significant nausea, bilious emesis on 11/19 and no tube feeding was started however it was never ordered.  DrRamirez note today states no additional nausea/vomiting overnight.  I discussed all of this with BrookeNP as well as Clinimix shortage and desire to start TF instead as had been planned.   She then spoke with DrRamirez in the OR and he does not want to start TF as originally planned.    GI:  Gallstone pancreatitis, need surgery but Cards evaluation first. Endo:  Sensitive SSI with CBGs 102-126 Insulin requirements in the past 24 hours: 1 unit Lytes:  K 3.6 today.  Mg 1.8 on 11/13 (prev admission) Renal:  Cr 0.86.  UOP 0.974ml/kg/hr.  NS 8075ml/hr.  Pulm:  RA Cards:  Carotid artery stenosis, HTN, HLD.  BP a bit up.  Hepatobil:  TBili 1.4, otherwise LFTs wnl Neuro:  Depression, anxiety. ID:  Probable pancreatic necrosis.  WBC 18.2- improved.  AFeb. Microbiology results:  11/17 BCx: ngtd 11/17 UCx: neg  11/18 Abscess from drain: mod GNR> reincubated 11/17 MRSA PCR: neg Antimicrobials this admission:  Zosyn 11/17 >> 11/18 Fluc 11/17>> Primaxin 11/18>>  Best Practices: TPN  Access:  PICC 11/18 TPN start date:  11/20  Nutritional Goals (per RD recommendation on 11/18): KCal:  2000-2100 Protein:  120-130g  Current Nutrition:  NPO  Plan:  Clinimix 4.24/25 at 2840ml/hr and 20% Lipid emulsion at 885ml/hr Decrease NS to 5535ml/hr when TPN started this PM This provides 41 g of protein and 960 kCals per day to start, advance toward goal as able as pt may be at risk for refeeding syndrome Add MVI and trace elements in TPN Change Sensitive SSI to q4 Monitor TPN labs, in AM then qMon/Thurs Watch for signs of Refeeding syndrome   Marisue HumbleKendra Thales Knipple, PharmD Clinical Pharmacist [facility] [place] [place]

## 2016-05-05 NOTE — [place] FL2 (Signed)
[place] MEDICAID FL2 LEVEL OF CARE SCREENING TOOL     IDENTIFICATION  Patient Name: Colin West Birthdate: 04-Aug-1933 Sex: male Admission Date (Current Location): 05/02/2016  [facility] [place]  [place]   Facility and Address:  [facility] [place], [address], [place], [place] [address]      Provider Number: [id number]  Attending Physician Name and Address:  Clydia LlanoMutaz Elmahi, MD  Relative Name and Phone Number:       Current Level of Care: Hospital Recommended Level of Care: Skilled Nursing Facility Prior Approval Number:    Date Approved/Denied:   PASRR Number:    Discharge Plan: SNF    Current Diagnoses: Patient Active Problem List   Diagnosis Date Noted  . Pancreatic abscess 05/02/2016  . Tinea cruris 05/02/2016  . Sacral decubitus ulcer, stage II 05/02/2016  . SVT (supraventricular tachycardia) (HCC)   . Congestive dilated cardiomyopathy (HCC)   . SOB (shortness of breath)   . Elevated troponin   . Sepsis (HCC) 04/22/2016  . Leukocytosis 04/22/2016  . Hypotension 04/22/2016  . Sinus tachycardia 04/22/2016  . History of acute pancreatitis 04/22/2016  . SIRS (systemic inflammatory response syndrome) (HCC) 04/22/2016  . Cholelithiasis   . Malnutrition of moderate degree 03/28/2016  . Hyperbilirubinemia 03/26/2016  . Abdominal pain 03/23/2016  . Acute pancreatitis 03/23/2016  . Femoral hernia - left - s/p lap repair 06/01/2013 06/01/2013  . Bilateral inguinal hernia (BIH) s/p lap repair 06/01/2013 05/30/2013  . Occlusion and stenosis of carotid artery without mention of cerebral infarction 04/01/2012  . Hypertrophy of prostate with urinary obstruction and other lower urinary tract symptoms (LUTS) 05/30/2010  . CRYPTOCOCCOSIS 03/27/2010  . Essential hypertension 03/27/2010  . HYDROCELE 03/27/2010    Orientation RESPIRATION BLADDER Height & Weight     Self, Time, Situation, Place  Normal Continent Weight: 160 lb 11.5 oz  (72.9 kg) Height:  5\' 9"  (175.3 cm)  BEHAVIORAL SYMPTOMS/MOOD NEUROLOGICAL BOWEL NUTRITION STATUS      Continent Diet (see dc summary)  AMBULATORY STATUS COMMUNICATION OF NEEDS Skin   Limited Assist Verbally Normal                       Personal Care Assistance Level of Assistance    Bathing Assistance: Limited assistance   Dressing Assistance: Limited assistance     Functional Limitations Info             SPECIAL CARE FACTORS FREQUENCY  PT (By licensed PT), OT (By licensed OT)     PT Frequency: 5/wk OT Frequency: 5/wk            Contractures      Additional Factors Info  Code Status, Allergies Code Status Info: FULL Allergies Info: NKA           Current Medications (05/05/2016):  This is the current hospital active medication list Current Facility-Administered Medications  Medication Dose Route Frequency Provider Last Rate Last Dose  . 0.9 %  sodium chloride infusion   Intravenous Continuous Clydia LlanoMutaz Elmahi, MD 75 mL/hr at 05/04/16 1733    . acetaminophen (TYLENOL) tablet 650 mg  650 mg Oral Q6H PRN Marcos EkeSara E Wertman, PA-C   650 mg at 05/02/16 2021   Or  . acetaminophen (TYLENOL) suppository 650 mg  650 mg Rectal Q6H PRN Marcos EkeSara E Wertman, PA-C      . atorvastatin (LIPITOR) tablet 10 mg  10 mg Oral q1800 Rhona RaiderJacob J Stinson, DO   10  mg at 05/04/16 1723  . bisacodyl (DULCOLAX) suppository 10 mg  10 mg Rectal Daily PRN Marcos EkeSara E Wertman, PA-C      . chlorhexidine (PERIDEX) 0.12 % solution 15 mL  15 mL Mouth Rinse BID Clydia LlanoMutaz Elmahi, MD   15 mL at 05/05/16 1026  . diltiazem (CARDIZEM) 100 mg in dextrose 5% 100mL (1 mg/mL) infusion  5-15 mg/hr Intravenous Titrated Rondell A Smith, MD      . fluconazole (DIFLUCAN) tablet 100 mg  100 mg Oral Daily Rhona RaiderJacob J Stinson, DO   100 mg at 05/05/16 1027  . imipenem-cilastatin (PRIMAXIN) 500 mg in sodium chloride 0.9 % 100 mL IVPB  500 mg Intravenous Q6H Lauren D Bajbus, RPH   500 mg at 05/05/16 0549  . insulin aspart (novoLOG) injection  0-9 Units  0-9 Units Subcutaneous TID WC Rhona RaiderJacob J Stinson, DO      . lipase/protease/amylase (CREON) capsule 36,000 Units  36,000 Units Oral TID AC Marcos EkeSara E Wertman, PA-C   36,000 Units at 05/05/16 0805  . magnesium citrate solution 1 Bottle  1 Bottle Oral Once PRN Marcos EkeSara E Wertman, PA-C      . MEDLINE mouth rinse  15 mL Mouth Rinse q12n4p Clydia LlanoMutaz Elmahi, MD   15 mL at 05/04/16 1600  . metoCLOPramide (REGLAN) tablet 5-10 mg  5-10 mg Oral Q8H PRN Marcos EkeSara E Wertman, PA-C      . metoprolol (LOPRESSOR) injection 5 mg  5 mg Intravenous Q6H PRN Clydia LlanoMutaz Elmahi, MD      . metoprolol tartrate (LOPRESSOR) tablet 100 mg  100 mg Oral BID Marcos EkeSara E Wertman, PA-C   100 mg at 05/05/16 1026  . morphine 2 MG/ML injection 1-2 mg  1-2 mg Intravenous Q4H PRN Clydia LlanoMutaz Elmahi, MD   2 mg at 05/04/16 1722  . ondansetron (ZOFRAN) tablet 4 mg  4 mg Oral Q6H PRN Marcos EkeSara E Wertman, PA-C       Or  . ondansetron [facility](ZOFRAN) injection 4 mg  4 mg Intravenous Q6H PRN Marcos EkeSara E Wertman, PA-C   4 mg at 05/05/16 0555  . pantoprazole (PROTONIX) injection 40 mg  40 mg Intravenous Q12H Clydia LlanoMutaz Elmahi, MD   40 mg at 05/05/16 1027  . senna-docusate (Senokot-S) tablet 1 tablet  1 tablet Oral QHS PRN Marcos EkeSara E Wertman, PA-C   1 tablet at 05/03/16 2005  . sodium chloride flush (NS) 0.9 % injection 10-40 mL  10-40 mL Intracatheter Q12H Clydia LlanoMutaz Elmahi, MD   10 mL at 05/05/16 1027  . sodium chloride flush (NS) 0.9 % injection 10-40 mL  10-40 mL Intracatheter PRN Clydia LlanoMutaz Elmahi, MD      . sodium chloride flush (NS) 0.9 % injection 3 mL  3 mL Intravenous Q12H Marcos EkeSara E Wertman, PA-C   3 mL at 05/05/16 1027     Discharge Medications: Please see discharge summary for a list of discharge medications.  Relevant Imaging Results:  Relevant Lab Results:   Additional Information SSN: [id number]  Areg Bialas, [place], [place]

## 2016-05-05 NOTE — Clinical Social Work Note (Signed)
Clinical Social Work Assessment  Patient Details  Name: Colin West N Tuman MRN: [id number] Date of Birth: 08/02/1933  Date of referral:  05/05/16               Reason for consult:  Facility Placement                Permission sought to share information with:  Facility Industrial/product designerContact Representative Permission granted to share information::  Yes, Verbal Permission Granted  Name::        Agency::  SNF  Relationship::     Contact Information:     Housing/Transportation Living arrangements for the past 2 months:  Single Family Home, Skilled Nursing Facility Source of Information:  Adult Children Patient Interpreter Needed:  None Criminal Activity/Legal Involvement Pertinent to Current Situation/Hospitalization:  No - Comment as needed Significant Relationships:  Adult Children Lives with:  Self Do you feel safe going back to the place where you live?  Yes Need for family participation in patient care:  No (Coment)  Care giving concerns:  Pt lives at home alone- currently needing extensive physical and medical help   Social Worker assessment / plan:  CSW spoke with pt briefly about plan for time of DC.  Pt groggy but answered questions appropriately.  [facility] about a [facility] and was in process of being transferred [facility] when readmitted to hospital. CSW informed of PT continued recommendation for SNF.  Employment status:  Part-Time Health and safety inspectornsurance information:  Medicare PT Recommendations:  Skilled Nursing Facility Information / Referral to community resources:  Skilled Nursing Facility  Patient/Family's Response to care:  Pt agreeable to return to SNF- does want to continue with transfer to [place].  Patient/Family's Understanding of and Emotional Response to Diagnosis, Current Treatment, and Prognosis:  Pt has no questions or concerns- hopeful he will recover quickly and be able to return to independent lifestyle.  Emotional Assessment Appearance:  Appears  stated age Attitude/Demeanor/Rapport:  Sedated Affect (typically observed):  Appropriate Orientation:  Oriented to Situation, Oriented to  Time, Oriented to Place, Oriented to Self Alcohol / Substance use:  Not Applicable Psych involvement (Current and /or in the community):  No (Comment)  Discharge Needs  Concerns to be addressed:  Care Coordination Readmission within the last 30 days:  Yes Current discharge risk:  Physical Impairment Barriers to Discharge:  Continued Medical Work up   [employer], Charlyn Vialpando H, LCSW 05/05/2016, 11:00 AM

## 2016-05-05 NOTE — Care Management Note (Signed)
Case Management Note  Patient Details  Name: Colin West MRN: [id number] Date of Birth: June 30, 1933  Subjective/Objective:    Pancreatic abscess with aspiration, HTN                 Action/Plan: Discharge Planning:  Chart reviewed. Spoke to CSW. CSW following for SNF placement. Will continue to follow for dc needs.   PCP Verl BangsADIONTCHENKO, ALEXEI MD  Expected Discharge Date:                Expected Discharge Plan:  Skilled Nursing Facility  In-House Referral:  Clinical Social Work  Discharge planning Services  CM Consult  Post Acute Care Choice:  NA Choice offered to:  NA  DME Arranged:  N/A DME Agency:  NA  HH Arranged:  NA HH Agency:  NA  Status of Service:  Completed, signed off  If discussed at Long Length of Stay Meetings, dates discussed:    Additional Comments:  Elliot CousinShavis, Elexius Minar Ellen, RN 05/05/2016, 10:52 AM

## 2016-05-05 NOTE — Progress Notes (Signed)
PROGRESS NOTE  Colin HasGraham N Weinman  ZOX:[id number] DOB: [birth year] DOA: 05/02/2016 PCP: Verl BangsADIONTCHENKO, ALEXEI, MD Outpatient Specialists:  Subjective: No nausea vomiting overnight, in general surgery plans noted, start TPN Continue current antibiotics, cultures still showed GNR, susceptibility pending  Brief Narrative:  Colin West is a 82 y.o. male  with complex history recently hospitalized from 10/8-10/12 for hemorrhagic pancreatitis from gallblader source, and on  11/7-11/13 for Severe Sepsis due to E.Coli pyelonephritis, discharged [facility], brought to the emergency department with worsening generalized weakness, today  evere enough with  associated nausea and vomiting. He denies shortness of breath or productive cough. He has been unable to ambulate due to weakness. He denies any abdominal pain, he has occasional nausea without vomiting, he denies any diarrhea or constipation. He has not been able to eat a normal meal, lost about 15 lbs in the last 2 months.  He reports frequent hiccups. He denies any lower extremity swelling. Due to prolonged bed rest, he has developed some the ulcers in the sacrum and gluteal  areas as well as some minimal heel blanching hese is being cared at the facility as well. No bleeding issues are reported such as hemoptysis, cemetery messages, hematuria, or hematochezia  Assessment & Plan:   Principal Problem:   Pancreatic abscess Active Problems:   Essential hypertension   Hyperbilirubinemia   Malnutrition of moderate degree   Leukocytosis   History of acute pancreatitis   Congestive dilated cardiomyopathy (HCC)   Tinea cruris   Sacral decubitus ulcer, stage II   Multiple Pancreatic Abscesses -Presented to the hospital with abdominal pain, nausea and loss of appetite. -CT scan showed multiple peripancreatic abscesses, was recently in the hospital from 10/8-10/12 for hemorrhagic pancreatitis from gallbladder source. -Had  low-grade fever of 100.1, nontoxic without tachycardia or tachypnea. -Started on vancomycin and Zosyn, will switch to Primaxin per general surgery recommendations. -Continue IV fluids, change Diflucan to IV as patient has nausea and vomiting with medications.  Protein calorie malnutrition, severe  -Lost 20 pounds in the past 2 months, albumin is 2.3. Prealbumin is <5 -Per general surgery recommendation try postpyloric enteral nutrition prior to TPN. -CORTRAK tube in the duodenum, hopefully the peristalsis will advance the tube without intervention.  Atrial fibrillation, transient -Had run of atrial fibrillation with heart rate in the 130s, back to sinus rhythm with IV metoprolol. -Not sure if he needs anticoagulation for transient transient A. fib. -Check TSH and keep on scheduled metoprolol.  Type II Diabetes Current blood sugar level is 174 Currently nothing by mouth, per general surgery once into postpyloric feeding. Await their recommendations.   Hypertension BP 125/65   Pulse 86   Controlled Continue home anti-hypertensive medications    Hiccups This is likely secondary to the irritation of the diaphragm from pancreatic abscess, on Reglan.  Hyperlipidemia Continue home statins   Dilated Cardiomyopathy in view of recent sepsis 2 D echo recently shows EF 30-35 % with Gr 1 DD Will be very careful with IV fluids.  ?Dementia -Patient was not able to remember what I told him yesterday. Did not remember why he is in the hospital.    DVT prophylaxis:  Code Status: Full Code Family Communication:  Disposition Plan:  Diet: Diet NPO time specified Except for: Sips with Meds  Consultants:   Gen. surgery.  Interventional radiology  Procedures:   Aspiration of abscess on 05/04/2016  Antimicrobials:   Zosyn and vancomycin changed to Primaxin on 05/03/2016  Objective:  Vitals:   05/04/16 2345 05/05/16 0515 05/05/16 0802 05/05/16 1000  BP: (!) 153/71 129/62  129/73 132/80  Pulse:   (!) 113 91  Resp:   19 (!) 21  Temp: 97.6 F (36.4 C) 98.5 F (36.9 C) 97.7 F (36.5 C)   TempSrc: Oral Oral Oral   SpO2:   95% 98%  Weight:      Height:        Intake/Output Summary (Last 24 hours) at 05/05/16 1131 Last data filed at 05/05/16 0800  Gross per 24 hour  Intake             3020 ml  Output              800 ml  Net             2220 ml   Filed Weights   05/02/16 1835  Weight: 72.9 kg (160 lb 11.5 oz)    Examination: General exam: Appears calm and comfortable  Respiratory system: Clear to auscultation. Respiratory effort normal. Cardiovascular system: S1 & S2 heard, RRR. No JVD, murmurs, rubs, gallops or clicks. No pedal edema. Gastrointestinal system: Abdomen is nondistended, soft and nontender. No organomegaly or masses felt. Normal bowel sounds heard. Central nervous system: Alert and oriented. No focal neurological deficits. Extremities: Symmetric 5 x 5 power. Skin: No rashes, lesions or ulcers Psychiatry: Judgement and insight appear normal. Mood & affect appropriate.   Data Reviewed: I have personally reviewed following labs and imaging studies  CBC:  Recent Labs Lab 05/01/16 05/02/16 1146 05/03/16 0242 05/04/16 0355 05/05/16 0545  WBC 19.9 26.0* 23.6* 21.0* 18.2*  NEUTROABS  --  22.3*  --   --   --   HGB 11.9* 12.9* 11.0* 11.1* 11.0*  HCT 35* 38.6* 33.9* 34.4* 33.2*  MCV  --  96.3 96.0 96.1 95.4  PLT 200 231 216 233 223   Basic Metabolic Panel:  Recent Labs Lab 05/01/16 05/02/16 1146 05/03/16 0242 05/04/16 0355 05/05/16 0545  NA 136* 133* 136 135 137  K 4.0 4.3 4.2 3.8 3.6  CL  --  101 103 105 106  CO2  --  23 23 22 22   GLUCOSE  --  174* 108* 104* 112*  BUN 23* 23* 22* 18 24*  CREATININE 0.7 0.91 0.81 0.77 0.86  CALCIUM  --  8.9 8.0* 8.1* 8.1*   GFR: Estimated Creatinine Clearance: 68.5 mL/min (by C-G formula based on SCr of 0.86 mg/dL). Liver Function Tests:  Recent Labs Lab 05/01/16 05/02/16 1146  05/03/16 0242 05/04/16 0355 05/05/16 0545  AST 22 27 23 20 24   ALT 20 25 20 19 17   ALKPHOS 86 88 76 76 73  BILITOT  --  1.5* 1.3* 1.4* 1.4*  PROT  --  5.9* 5.1* 5.0* 4.9*  ALBUMIN  --  2.3* 1.9* 1.7* 1.7*    Recent Labs Lab 05/02/16 1729  LIPASE 18   No results for input(s): AMMONIA in the last 168 hours. Coagulation Profile:  Recent Labs Lab 05/03/16 0242  INR 1.43   Cardiac Enzymes:  Recent Labs Lab 05/02/16 1729 05/02/16 2305 05/03/16 0242  TROPONINI 0.03* 0.04* 0.03*   BNP (last 3 results) No results for input(s): PROBNP in the last 8760 hours. HbA1C: No results for input(s): HGBA1C in the last 72 hours. CBG:  Recent Labs Lab 05/04/16 0759 05/04/16 1231 05/04/16 1706 05/04/16 2150 05/05/16 0854  GLUCAP 106* 116* 113* 102* 104*   Lipid Profile: No results for input(s): CHOL, HDL,  LDLCALC, TRIG, CHOLHDL, LDLDIRECT in the last 72 hours. Thyroid Function Tests:  Recent Labs  05/05/16 0545  TSH 2.319   Anemia Panel: No results for input(s): VITAMINB12, FOLATE, FERRITIN, TIBC, IRON, RETICCTPCT in the last 72 hours. Urine analysis:    Component Value Date/Time   COLORURINE AMBER (A) 05/02/2016 1149   APPEARANCEUR CLOUDY (A) 05/02/2016 1149   LABSPEC 1.024 05/02/2016 1149   PHURINE 6.0 05/02/2016 1149   GLUCOSEU NEGATIVE 05/02/2016 1149   HGBUR NEGATIVE 05/02/2016 1149   BILIRUBINUR SMALL (A) 05/02/2016 1149   KETONESUR NEGATIVE 05/02/2016 1149   PROTEINUR NEGATIVE 05/02/2016 1149   UROBILINOGEN 0.2 03/08/2010 1026   NITRITE NEGATIVE 05/02/2016 1149   LEUKOCYTESUR SMALL (A) 05/02/2016 1149   Sepsis Labs: @LABRCNTIP (procalcitonin:4,lacticidven:4)  ) Recent Results (from the past 240 hour(s))  Culture, blood (Routine x 2)     Status: None (Preliminary result)   Collection Time: 05/02/16 11:45 AM  Result Value Ref Range Status   Specimen Description BLOOD LEFT ANTECUBITAL  Final   Special Requests BOTTLES DRAWN AEROBIC AND ANAEROBIC 5CC   Final   Culture NO GROWTH 2 DAYS  Final   Report Status PENDING  Incomplete  Urine culture     Status: None   Collection Time: 05/02/16 11:49 AM  Result Value Ref Range Status   Specimen Description URINE, CLEAN CATCH  Final   Special Requests NONE  Final   Culture NO GROWTH  Final   Report Status 05/03/2016 FINAL  Final  Culture, blood (Routine x 2)     Status: None (Preliminary result)   Collection Time: 05/02/16 12:40 PM  Result Value Ref Range Status   Specimen Description BLOOD RIGHT ANTECUBITAL  Final   Special Requests BOTTLES DRAWN AEROBIC AND ANAEROBIC 10ML  Final   Culture NO GROWTH 2 DAYS  Final   Report Status PENDING  Incomplete  MRSA PCR Screening     Status: None   Collection Time: 05/02/16  6:32 PM  Result Value Ref Range Status   MRSA by PCR NEGATIVE NEGATIVE Final    Comment:        The GeneXpert MRSA Assay (FDA approved for NASAL specimens only), is one component of a comprehensive MRSA colonization surveillance program. It is not intended to diagnose MRSA infection nor to guide or monitor treatment for MRSA infections.   Aerobic/Anaerobic Culture (surgical/deep wound)     Status: None (Preliminary result)   Collection Time: 05/03/16  2:10 PM  Result Value Ref Range Status   Specimen Description DRAINAGE  Final   Special Requests Normal  Final   Gram Stain   Final    ABUNDANT WBC PRESENT, PREDOMINANTLY PMN MODERATE GRAM NEGATIVE RODS    Culture CULTURE REINCUBATED FOR BETTER GROWTH  Final   Report Status PENDING  Incomplete     Invalid input(s): PROCALCITONIN, LACTICACIDVEN   Radiology Studies: Dg Abd Portable 1v  Result Date: 05/03/2016 CLINICAL DATA:  Enteric tube placement. EXAM: PORTABLE ABDOMEN - 1 VIEW COMPARISON:  05/03/2016 CT FINDINGS: A small bore feeding tube is identified with tip overlying the descending duodenum. The bowel gas pattern is unremarkable. No acute bony abnormalities are identified. IMPRESSION: Small bore feeding tube  with tip overlying the descending duodenum. Electronically Signed   By: Harmon PierJeffrey  Hu M.D.   On: 05/03/2016 17:39        Scheduled Meds: . atorvastatin  10 mg Oral q1800  . chlorhexidine  15 mL Mouth Rinse BID  . fluconazole  100 mg Oral Daily  .  imipenem-cilastatin  500 mg Intravenous Q6H  . insulin aspart  0-9 Units Subcutaneous TID WC  . lipase/protease/amylase  36,000 Units Oral TID AC  . mouth rinse  15 mL Mouth Rinse q12n4p  . metoprolol  100 mg Oral BID  . pantoprazole (PROTONIX) IV  40 mg Intravenous Q12H  . sodium chloride flush  10-40 mL Intracatheter Q12H  . sodium chloride flush  3 mL Intravenous Q12H   Continuous Infusions: . sodium chloride 75 mL/hr at 05/04/16 1733  . diltiazem (CARDIZEM) infusion       LOS: 3 days    Time spent: 35 minutes    Alexxander Kurt A, MD [facility] Pager [phone]  If 7PM-7AM, please contact night-coverage [email or web address] Password [email or web address] 05/05/2016, 11:31 AM

## 2016-05-05 NOTE — Significant Event (Signed)
Spoke with patient's RN who stated that patient already has a cortrak feeding tube. This RN will complete order for cortrak that was placed 05/03/2016.

## 2016-05-05 NOTE — Progress Notes (Signed)
Subjective: Pt doing well.  No additional n/v ovenright, but some yesterday.  Objective: Vital signs in last 24 hours: Temp:  [97.6 F (36.4 C)-98.9 F (37.2 C)] 97.7 F (36.5 C) (11/20 0802) Pulse Rate:  [83-113] 113 (11/20 0802) Resp:  [18-24] 19 (11/20 0802) BP: (129-153)/(62-78) 129/73 (11/20 0802) SpO2:  [95 %-97 %] 95 % (11/20 0802) Last BM Date: 05/05/16  Intake/Output from previous day: 11/19 0701 - 11/20 0700 In: 3090 [P.O.:240; I.V.:2350; IV Piggyback:500] Out: 800 [Urine:700; Emesis/NG output:100] Intake/Output this shift: Total I/O In: 450 [I.V.:450] Out: -   General appearance: alert and cooperative GI: soft, dist, nttp, no peritoneal signs  Lab Results:   Recent Labs  05/04/16 0355 05/05/16 0545  WBC 21.0* 18.2*  HGB 11.1* 11.0*  HCT 34.4* 33.2*  PLT 233 223   BMET  Recent Labs  05/04/16 0355 05/05/16 0545  NA 135 137  K 3.8 3.6  CL 105 106  CO2 22 22  GLUCOSE 104* 112*  BUN 18 24*  CREATININE 0.77 0.86  CALCIUM 8.1* 8.1*   PT/INR  Recent Labs  05/03/16 0242  LABPROT 17.5*  INR 1.43   ABG No results for input(s): PHART, HCO3 in the last 72 hours.  Invalid input(s): PCO2, PO2  Studies/Results: X-ray Chest Pa And Lateral  Result Date: 05/03/2016 CLINICAL DATA:  Preoperative examination.  PICC placement. EXAM: CHEST  2 VIEW COMPARISON:  PA and lateral chest 05/02/2016. FINDINGS: New right PICC is in place with the tip near the superior cavoatrial junction. Lung volumes are low but the lungs are clear. Heart size is normal. No pneumothorax or pleural effusion. Remote right rib fractures are seen. Postoperative change right lung apex and right shoulder noted. IMPRESSION: No active cardiopulmonary disease. Electronically Signed   By: Drusilla Kannerhomas  Dalessio M.D.   On: 05/03/2016 09:26   Ct Aspiration  Result Date: 05/03/2016 INDICATION: Pancreatitis with peripancreatic abscess/pseudocyst. Please perform CT-guided aspiration for the  acquisition of a sample for laboratory analysis. EXAM: CT GUIDANCE NEEDLE PLACEMENT COMPARISON:  CT abdomen pelvis - 05/02/2016 ; 04/22/2016 MEDICATIONS: The patient is currently admitted to the hospital and receiving intravenous antibiotics. The antibiotics were administered within an appropriate time frame prior to the initiation of the procedure. ANESTHESIA/SEDATION: Moderate (conscious) sedation was employed during this procedure. A total of Versed 1 mg and Fentanyl 75 mcg was administered intravenously. Moderate Sedation Time: 15 minutes. The patient's level of consciousness and vital signs were monitored continuously by radiology nursing throughout the procedure under my direct supervision. CONTRAST:  None COMPLICATIONS: None immediate. PROCEDURE: Informed written consent was obtained from the patient after a discussion of the risks, benefits and alternatives to treatment. The patient was placed supine, slightly RPO on the CT gantry and a pre procedural CT was performed re-demonstrating the known ill-defined abscess/fluid collection about the pancreas. The procedure was planned. A timeout was performed prior to the initiation of the procedure. The skin overlying the left lateral abdomen was prepped and draped in the usual sterile fashion. The overlying soft tissues were anesthetized with 1% lidocaine with epinephrine. Appropriate trajectory was planned with the use of a 22 gauge spinal needle. An 18 gauge trocar needle was advanced into the abscess/fluid collection and a short Amplatz super stiff wire was coiled within the collection. Appropriate positioning was confirmed with a limited CT scan. The tract was dilated allowing placement of a temporary 8 [place] all-purpose drainage catheter. Approximately 75 cc of purulent fluid was aspirated as the 8 French drainage catheter  was slowly retracted. A dressing was placed. The patient tolerated the procedure well without immediate post procedural complication.  IMPRESSION: Successful CT guided aspiration of approximately 75 cc of purulent fluid from the peripancreatic fluid collection/pseudocyst. Samples were sent to the laboratory as requested by the ordering clinical team. Electronically Signed   By: Simonne ComeJohn  Watts M.D.   On: 05/03/2016 11:50   Dg Abd Portable 1v  Result Date: 05/03/2016 CLINICAL DATA:  Enteric tube placement. EXAM: PORTABLE ABDOMEN - 1 VIEW COMPARISON:  05/03/2016 CT FINDINGS: A small bore feeding tube is identified with tip overlying the descending duodenum. The bowel gas pattern is unremarkable. No acute bony abnormalities are identified. IMPRESSION: Small bore feeding tube with tip overlying the descending duodenum. Electronically Signed   By: Harmon PierJeffrey  Hu M.D.   On: 05/03/2016 17:39    Anti-infectives: Anti-infectives    Start     Dose/Rate Route Frequency Ordered Stop   05/03/16 1200  imipenem-cilastatin (PRIMAXIN) 500 mg in sodium chloride 0.9 % 100 mL IVPB     500 mg 200 mL/hr over 30 Minutes Intravenous Every 6 hours 05/03/16 1122     05/03/16 0030  piperacillin-tazobactam (ZOSYN) IVPB 3.375 g  Status:  Discontinued     3.375 g 12.5 mL/hr over 240 Minutes Intravenous Every 8 hours 05/02/16 1647 05/03/16 1101   05/02/16 1930  fluconazole (DIFLUCAN) tablet 100 mg     100 mg Oral Daily 05/02/16 1816     05/02/16 1600  piperacillin-tazobactam (ZOSYN) IVPB 3.375 g     3.375 g 100 mL/hr over 30 Minutes Intravenous  Once 05/02/16 1548 05/02/16 1720      Assessment/Plan: Gallstone pancreatitis  Pancreatic abscess Severe protein calorie malnutrition E coli pyelonephritis weakness Sacral ulcer CHF  -Will start TNA as likely will not tolerate TFs at this point -Will need gallbladder out, but pt has barriers to overcome.   -Will need improved nutrition, improvement/resolution of pancreatitis, and cards has recommended stress test.   -Cultures pending Continue primaxin/fluconazole. Will follow.     LOS: 3 days     Colin Ehlersamirez Jr., [facility] 05/05/2016

## 2016-05-05 NOTE — Progress Notes (Signed)
Physical Therapy Treatment Patient Details Name: Colin West MRN: [id number] DOB: 1934-02-26 Today's Date: 05/05/2016    History of Present Illness Pt readmitted with pancreatic abcessess. Pt with 2 recent admissions for hemorrhagic gallstone pancreatitis and sepsis. PMH - HTN, DM    PT Comments    Pt making slow progress. Anxious about falling so would not amb further.  Follow Up Recommendations  SNF     Equipment Recommendations  None recommended by PT    Recommendations for Other Services       Precautions / Restrictions Precautions Precautions: Fall Restrictions Weight Bearing Restrictions: No    Mobility  Bed Mobility Overal bed mobility: Needs Assistance Bed Mobility: Supine to Sit     Supine to sit: Mod assist     General bed mobility comments: Assist to elevate trunk  Transfers Overall transfer level: Needs assistance Equipment used: Rolling walker (2 wheeled) Transfers: Sit to/from Stand Sit to Stand: Min assist         General transfer comment: Assist to bring hips up and for balance.  Ambulation/Gait Ambulation/Gait assistance: Min assist Ambulation Distance (Feet): 8 Feet Assistive device: Rolling walker (2 wheeled) Gait Pattern/deviations: Step-through pattern;Decreased stride length;Trunk flexed Gait velocity: decr Gait velocity interpretation: Below normal speed for age/gender General Gait Details: Assist for balance and support. Verbal cues to stay closer to the walker. Pt anxious and worried about falling and would not amb further.   Stairs            Wheelchair Mobility    Modified Rankin (Stroke Patients Only)       Balance Overall balance assessment: Needs assistance Sitting-balance support: No upper extremity supported Sitting balance-Leahy Scale: Fair     Standing balance support: Bilateral upper extremity supported Standing balance-Leahy Scale: Poor Standing balance comment: walker and min A for static  standing                    Cognition Arousal/Alertness: Awake/alert Behavior During Therapy: Anxious Overall Cognitive Status: Within Functional Limits for tasks assessed                      Exercises      General Comments        Pertinent Vitals/Pain      Home Living                      Prior Function            PT Goals (current goals can now be found in the care plan section) Acute Rehab PT Goals Patient Stated Goal: get better Progress towards PT goals: Progressing toward goals    Frequency    Min 3X/week      PT Plan Current plan remains appropriate    Co-evaluation             End of Session Equipment Utilized During Treatment: Gait belt Activity Tolerance: Patient limited by fatigue Patient left: in chair;with call bell/phone within reach;with chair alarm set     Time: [id number] PT Time Calculation (min) (ACUTE ONLY): 24 min  Charges:  $Gait Training: 23-37 mins                    G CodesAngelina Ok:      Terissa Haffey W [place] 05/05/2016, 1:36 PM Fluor CorporationCary Vanice Rappa PT [phone]

## 2016-05-06 ENCOUNTER — Inpatient Hospital Stay (HOSPITAL_COMMUNITY): Payer: Medicare Other

## 2016-05-06 LAB — GLUCOSE, CAPILLARY
GLUCOSE-CAPILLARY: 178 mg/dL — AB (ref 65–99)
GLUCOSE-CAPILLARY: 181 mg/dL — AB (ref 65–99)
GLUCOSE-CAPILLARY: 194 mg/dL — AB (ref 65–99)
GLUCOSE-CAPILLARY: 241 mg/dL — AB (ref 65–99)
Glucose-Capillary: 184 mg/dL — ABNORMAL HIGH (ref 65–99)
Glucose-Capillary: 182 mg/dL — ABNORMAL HIGH (ref 65–99)
Glucose-Capillary: 158 mg/dL — ABNORMAL HIGH (ref 65–99)

## 2016-05-06 LAB — COMPREHENSIVE METABOLIC PANEL
ALBUMIN: 1.7 g/dL — AB (ref 3.5–5.0)
ALT: 18 U/L (ref 17–63)
ANION GAP: 8 (ref 5–15)
AST: 30 U/L (ref 15–41)
Alkaline Phosphatase: 91 U/L (ref 38–126)
BUN: 23 mg/dL — ABNORMAL HIGH (ref 6–20)
CHLORIDE: 105 mmol/L (ref 101–111)
CO2: 24 mmol/L (ref 22–32)
CREATININE: 0.65 mg/dL (ref 0.61–1.24)
Calcium: 7.9 mg/dL — ABNORMAL LOW (ref 8.9–10.3)
GFR calc non Af Amer: >60 mL/min (ref >60)
GLUCOSE: 201 mg/dL — AB (ref 65–99)
Potassium: 3.2 mmol/L — ABNORMAL LOW (ref 3.5–5.1)
SODIUM: 137 mmol/L (ref 135–145)
Total Bilirubin: 0.6 mg/dL (ref 0.3–1.2)
Total Protein: 4.7 g/dL — ABNORMAL LOW (ref 6.5–8.1)

## 2016-05-06 LAB — TRIGLYCERIDES: Triglycerides: 78 mg/dL (ref <150)

## 2016-05-06 LAB — CBC
HCT: 34.5 % — ABNORMAL LOW (ref 39.0–52.0)
HEMOGLOBIN: 11.1 g/dL — AB (ref 13.0–17.0)
MCH: 30.9 pg (ref 26.0–34.0)
MCHC: 32.2 g/dL (ref 30.0–36.0)
MCV: 96.1 fL (ref 78.0–100.0)
PLATELETS: 246 10*3/uL (ref 150–400)
RBC: 3.59 MIL/uL — AB (ref 4.22–5.81)
RDW: 13.9 % (ref 11.5–15.5)
WBC: 15.5 10*3/uL — AB (ref 4.0–10.5)

## 2016-05-06 LAB — DIFFERENTIAL
Basophils Absolute: 0 10*3/uL (ref 0.0–0.1)
Basophils Relative: 0 %
Eosinophils Absolute: 0.3 10*3/uL (ref 0.0–0.7)
Eosinophils Relative: 2 %
LYMPHS PCT: 10 %
Lymphs Abs: 1.6 10*3/uL (ref 0.7–4.0)
MONO ABS: 1.8 10*3/uL — AB (ref 0.1–1.0)
MONOS PCT: 12 %
NEUTROS ABS: 11.8 10*3/uL — AB (ref 1.7–7.7)
Neutrophils Relative %: 76 %

## 2016-05-06 LAB — PHOSPHORUS: Phosphorus: 2.1 mg/dL — ABNORMAL LOW (ref 2.5–4.6)

## 2016-05-06 LAB — MAGNESIUM: MAGNESIUM: 1.8 mg/dL (ref 1.7–2.4)

## 2016-05-06 LAB — PREALBUMIN: Prealbumin: <5 mg/dL — ABNORMAL LOW (ref 18–38)

## 2016-05-06 MED ORDER — TRACE MINERALS CR-CU-MN-SE-ZN 10-1000-500-60 MCG/ML IV SOLN
INTRAVENOUS | Status: AC
  Administered 2016-05-06: 18:00:00 via INTRAVENOUS
  Filled 2016-05-06: qty 960

## 2016-05-06 MED ORDER — PRO-STAT SUGAR FREE PO LIQD: 30.0000 mL | Freq: Two times a day (BID) | ORAL | Status: DC

## 2016-05-06 MED ORDER — VITAL AF 1.2 CAL PO LIQD
1000.0000 mL | ORAL | Status: DC
  Administered 2016-05-06: 1000 mL
  Filled 2016-05-06 (×3): qty 1000

## 2016-05-06 MED ORDER — SODIUM PHOSPHATES 45 MMOLE/15ML IV SOLN
15.0000 mmol | Freq: Once | INTRAVENOUS | Status: AC
  Administered 2016-05-06: 15 mmol via INTRAVENOUS
  Filled 2016-05-06: qty 5

## 2016-05-06 MED ORDER — FAT EMULSION 20 % IV EMUL
120.0000 mL | INTRAVENOUS | Status: AC
  Administered 2016-05-06: 120 mL via INTRAVENOUS
  Filled 2016-05-06: qty 200

## 2016-05-06 MED ORDER — MAGNESIUM SULFATE IN D5W 1-5 GM/100ML-% IV SOLN
1.0000 g | Freq: Once | INTRAVENOUS | Status: AC
  Administered 2016-05-06: 1 g via INTRAVENOUS
  Filled 2016-05-06: qty 100

## 2016-05-06 MED ORDER — POTASSIUM CHLORIDE CRYS ER 20 MEQ PO TBCR
20.0000 meq | EXTENDED_RELEASE_TABLET | ORAL | Status: AC
  Administered 2016-05-06 (×2): 20 meq via ORAL
  Filled 2016-05-06 (×2): qty 1

## 2016-05-06 MED ORDER — DILTIAZEM HCL 100 MG IV SOLR: 5.0000 mg/h | INTRAVENOUS | Status: DC

## 2016-05-06 MED ORDER — INSULIN ASPART 100 UNIT/ML ~~LOC~~ SOLN
0.0000 [IU] | SUBCUTANEOUS | Status: DC
  Administered 2016-05-06: 3 [IU] via SUBCUTANEOUS
  Administered 2016-05-06: 5 [IU] via SUBCUTANEOUS
  Administered 2016-05-06 – 2016-05-07 (×3): 3 [IU] via SUBCUTANEOUS
  Administered 2016-05-07: 2 [IU] via SUBCUTANEOUS
  Administered 2016-05-07 – 2016-05-08 (×6): 3 [IU] via SUBCUTANEOUS
  Administered 2016-05-08: 2 [IU] via SUBCUTANEOUS
  Administered 2016-05-08 (×2): 3 [IU] via SUBCUTANEOUS
  Administered 2016-05-09: 5 [IU] via SUBCUTANEOUS
  Administered 2016-05-09 – 2016-05-10 (×6): 2 [IU] via SUBCUTANEOUS
  Administered 2016-05-10: 3 [IU] via SUBCUTANEOUS
  Administered 2016-05-10 – 2016-05-11 (×4): 2 [IU] via SUBCUTANEOUS
  Administered 2016-05-11: 3 [IU] via SUBCUTANEOUS
  Administered 2016-05-11: 5 [IU] via SUBCUTANEOUS
  Administered 2016-05-11 – 2016-05-12 (×4): 3 [IU] via SUBCUTANEOUS
  Administered 2016-05-12: 2 [IU] via SUBCUTANEOUS
  Administered 2016-05-12 (×2): 3 [IU] via SUBCUTANEOUS
  Administered 2016-05-13 (×3): 2 [IU] via SUBCUTANEOUS
  Administered 2016-05-13: 3 [IU] via SUBCUTANEOUS
  Administered 2016-05-13: 1 [IU] via SUBCUTANEOUS
  Administered 2016-05-14 (×2): 2 [IU] via SUBCUTANEOUS
  Administered 2016-05-14: 1 [IU] via SUBCUTANEOUS
  Administered 2016-05-14: 2 [IU] via SUBCUTANEOUS
  Administered 2016-05-14: 3 [IU] via SUBCUTANEOUS
  Administered 2016-05-14: 2 [IU] via SUBCUTANEOUS
  Administered 2016-05-15 (×6): 3 [IU] via SUBCUTANEOUS
  Administered 2016-05-16: 2 [IU] via SUBCUTANEOUS
  Administered 2016-05-16 (×4): 3 [IU] via SUBCUTANEOUS
  Administered 2016-05-17: 2 [IU] via SUBCUTANEOUS
  Administered 2016-05-17: 3 [IU] via SUBCUTANEOUS
  Administered 2016-05-17: 2 [IU] via SUBCUTANEOUS
  Administered 2016-05-17 (×2): 3 [IU] via SUBCUTANEOUS
  Administered 2016-05-17: 2 [IU] via SUBCUTANEOUS
  Administered 2016-05-18: 3 [IU] via SUBCUTANEOUS
  Administered 2016-05-18: 2 [IU] via SUBCUTANEOUS
  Administered 2016-05-18 (×2): 3 [IU] via SUBCUTANEOUS
  Administered 2016-05-18 – 2016-05-19 (×2): 2 [IU] via SUBCUTANEOUS
  Administered 2016-05-19 – 2016-05-20 (×6): 3 [IU] via SUBCUTANEOUS
  Administered 2016-05-20 (×3): 2 [IU] via SUBCUTANEOUS
  Administered 2016-05-21: 3 [IU] via SUBCUTANEOUS
  Administered 2016-05-21: 2 [IU] via SUBCUTANEOUS

## 2016-05-06 NOTE — Progress Notes (Signed)
Nutrition Follow-up  DOCUMENTATION CODES:   Severe malnutrition in context of acute illness/injury  INTERVENTION:   TPN per Pharmacy, continue to monitor for refeeding Per MD order start Vital AF 1.2 @ 10 ml/hr, trickle per Cortrak tube. Monitor tolerance.    NUTRITION DIAGNOSIS:   Malnutrition (Severe) related to acute illness (Pancreatitis) as evidenced by severe depletion of muscle mass, severe depletion of body fat, energy intake < or equal to 50% for > or equal to 5 days. Ongoing.   GOAL:   Patient will meet greater than or equal to 90% of their needs Progressing.   MONITOR:   TF tolerance, Skin, I & O's, Labs  REASON FOR ASSESSMENT:   Consult Enteral/tube feeding initiation and management  ASSESSMENT:   82 year old male with a history of HTN, DM diet-controlled, hyperlipidemia. Patient was recently admitted with hemorrhagic pancreatitis secondary to gallbladder from 10/8-10/12. Patient was readmitted on 11/7-11/13 due to severe sepsis secondary to E coli and [facility]. He's been continuing to become weak with nausea and vomiting and mild abdominal pain.  11/18 Cortrak tube placed tip was in descending duodenum 11/19 N/V 11/20 Clinimix started - 4.25/25 @ 40 ml/hr with 5 ml/hr 20% lipids - 960 kcal and 41 grams protein. MVI and trace elements added to TPN, pt also receiving KDur, mg, NaPhos and labs are being monitored.  11/21 Consult received to start enteral nutrition via Cortrak tube, follow up xray pending. Will start semi-elemental tube feeding @ 10 ml/hr  No N/V today per RN, pt taking meds by mouth.   Medications reviewed and include: diflucan, novolog, creon, Naphos Labs reviewed: K+ 3.2, PO4: 2.1 CBG's: [phone]   Diet Order:  Diet NPO time specified Except for: Sips with Meds TPN (CLINIMIX-E) Adult TPN (CLINIMIX-E) Adult  Skin:  Wound (see comment) (MASD to groin, scrotum, and sacrum)  Last BM:  11/20  Height:    Ht Readings from Last 1 Encounters:  05/02/16 5\' 9"  (1.753 m)    Weight:   Wt Readings from Last 1 Encounters:  05/02/16 160 lb 11.5 oz (72.9 kg)    Ideal Body Weight:  72.7 kg  BMI:  Body mass index is 23.73 kg/m.  Estimated Nutritional Needs:   Kcal:  1900-2100  Protein:  100-125 grams  Fluid:  > 1.9 L/day  EDUCATION NEEDS:   No education needs identified at this time  Kendell BaneHeather Jair Lindblad RD, LDN, CNSC [phone] Pager [phone] After Hours Pager

## 2016-05-06 NOTE — Progress Notes (Signed)
PHARMACY - ADULT TOTAL PARENTERAL NUTRITION CONSULT NOTE   Pharmacy Consult for TPN Indication: Intolerance of enteral feeding  Patient Measurements: Height: 5\' 9"  (175.3 cm) Weight: 160 lb 11.5 oz (72.9 kg) IBW/kg (Calculated) : 70.7 TPN AdjBW (KG): 72.9 Body mass index is 23.73 kg/m.   Assessment:  82yo male admitted 10/8-10/12 with hemorrhagic pancreatitis 2nd GB, readmitted 11/7-11/13 with severe EColi sepsis, and readmitted 11/17 with weakness, N/V, and abdominal pain.  CT(+) intra-abdominal abscesses, WBC 26.  He has probable infected pancreatitic necrosis & will likely require surgery.  Pt has lost 20lb in the last 2 months, severe malnutrition per RD assessment.  A Cortrak team placed a post-pyloric TF access on 11/18, which was in descending duodenum with hope to advance via peristalsis.  Pt with significant nausea, bilious emesis on 11/19 and no tube feeding was started however it was never ordered.  DrRamirez note today states no additional nausea/vomiting overnight.  I discussed all of this with BrookeNP as well as Clinimix shortage and desire to start TF instead as had been planned.   She then spoke with DrRamirez in the OR and he does not want to start TF as originally planned.    GI:  Gallstone pancreatitis, need surgery but Cards evaluation first.  No emesis.   Possibly will go home on TF & come back for surgery, but cont TPN for now.  Creon, PPI IV q12 Endo:  CBGs 172-184 since TPN started.  Sens SSI Insulin requirements in the past 24 hours:  6 units Lytes:  K 3.2, Mg 1.8, Phos 2.1, CoCa 9.8 Renal:  Cr 0.65.  UOP 0.653ml/kg/hr.  NS 1535ml/hr.  Pulm:  RA Cards:  Carotid artery stenosis, HTN, HLD.  BP a bit up. Metoprolol Hepatobil:  TBili 1.4, otherwise LFTs wnl, TG 78, PAlb < 5 Neuro:  Depression, anxiety. ID:  Probable pancreatic necrosis.  WBC 15.5- improved.  AFeb. Microbiology results:  11/17 BCx: ngtd 11/17 UCx: neg  11/18 Abscess from drain: mod GNR>  reincubated 11/17 MRSA PCR: neg Antimicrobials this admission:  Zosyn 11/17 >> 11/18 Fluc 11/17>> Primaxin 11/18>>  Best Practices: mouth care TPN Access:  PICC 11/18 TPN start date:  11/20  Nutritional Goals (per RD recommendation on 11/18): KCal:  2000-2100 Protein:  120-130g  Current Nutrition:  NPO  Plan:  Continue Clinimix 4.24/25 at 6340ml/hr and 20% Lipid emulsion at 585ml/hr, due to possible refeeding Decrease NS to 3635ml/hr when TPN started this PM This provides 41 g of protein and 960 kCals per day to start, advance toward goal as able as pt may be at risk for refeeding syndrome Add MVI and trace elements in TPN Change to Moderate SSI KDur 20mEq q4 x 2 Mg 1g IV x 1 NaPhos 15mmol IV x 1 Monitor TPN labs, in AM then qMon/Thurs Watch for signs of Refeeding syndrome   Marisue HumbleKendra Lynnda Wiersma, PharmD Clinical Pharmacist [facility]

## 2016-05-06 NOTE — Care Management Important Message (Signed)
Important Message  Patient Details  Name: Farris HasGraham N Courtright MRN: [id number] Date of Birth: May 24, 1934   Medicare Important Message Given:  Yes    Elliot CousinShavis, Johnross Nabozny Ellen, RN 05/06/2016, 9:29 AM

## 2016-05-06 NOTE — Progress Notes (Addendum)
PROGRESS NOTE  Colin West  HQI:[id number] DOB: 1934/01/20 DOA: 05/02/2016 PCP: [name], MD Outpatient Specialists:  Subjective: Denies any nausea and vomiting, no A. fib overnight. Patient currently on TPN and tolerated that very well. Start tube feeding  Brief Narrative:  Colin West is a 82 y.o. male  with complex history recently hospitalized from 10/8-10/12 for hemorrhagic pancreatitis from gallblader source, and on  11/7-11/13 for Severe Sepsis due to E.Coli pyelonephritis, discharged to  [facility] on oral antibiotics, brought to the emergency department with worsening generalized weakness, today  evere enough with  associated nausea and vomiting. He denies shortness of breath or productive cough. He has been unable to ambulate due to weakness. He denies any abdominal pain, he has occasional nausea without vomiting, he denies any diarrhea or constipation. He has not been able to eat a normal meal, lost about 15 lbs in the last 2 months.  He reports frequent hiccups. He denies any lower extremity swelling. Due to prolonged bed rest, he has developed some the ulcers in the sacrum and gluteal  areas as well as some minimal heel blanching hese is being cared at the facility as well. 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 -CT scan showed multiple peripancreatic abscesses, was recently in the hospital from 10/8-10/12 for hemorrhagic pancreatitis from gallbladder source. -Currently  on Primaxin and Diflucan, likely he will need prolonged course of 3 weeks of antibiotics. -Patient developed pancreatic necrosis while he was eating, need to be strict NPO. -Has been the tube, will try to start to feeding (postpyloric). -Discussed with Dr. Derrell Lolling, patient's nutritional and functional status needs to improve prior to consider surgery. -Try to advance to feeding and patient can probably go to the nursing home and come back in 1-2 weeks for surgery. -Need surgery to take the gallbladder out. -If he tolerates tube feeding he might need PEJ tube.  Protein calorie malnutrition, severe  -Lost 20 pounds in the past 2 months, albumin is 2.3. Prealbumin is <5 -Per general surgery recommendation try postpyloric enteral nutrition prior to TPN. -Enteral feeding was not tried, TPN was started for his because patient was vomiting over the weekend. -CORTRAK, will start tube feeding if he tolerates will taper off TPN.  Atrial fibrillation, transient -Had run of atrial fibrillation with heart rate in the 130s, back to sinus rhythm with IV metoprolol. -Not sure if he needs anticoagulation for transient transient A. fib. -Check TSH and keep on scheduled metoprolol.  Type II Diabetes Current blood sugar level is 174 Currently nothing by mouth, per general surgery once into postpyloric feeding. Await their recommendations.   Hypertension BP 125/65   Pulse 86   Controlled Continue home anti-hypertensive medications    Hiccups This is likely secondary to the irritation of the diaphragm from pancreatic abscess, on Reglan.  Hyperlipidemia Continue home statins   Dilated Cardiomyopathy in view of recent sepsis 2 D echo recently shows EF 30-35 % with Gr 1 DD Will be very careful with IV fluids.  ?Dementia -Patient was  not able to remember what I told him yesterday. Did not remember why he is in the hospital.    DVT prophylaxis:  Code Status: Full Code Family Communication:   Disposition Plan:  Diet: Diet NPO time specified Except for: Sips with Meds TPN (CLINIMIX-E) Adult  Consultants:   Gen. surgery.  Interventional radiology  Procedures:   Aspiration of abscess on 05/04/2016  Antimicrobials:   Zosyn and vancomycin changed to Primaxin on 05/03/2016  Objective: Vitals:   05/05/16 2143 05/06/16 0054 05/06/16 0501 05/06/16 0740  BP: (!) 156/84 (!) 149/84 (!) 141/68 (!) 155/84  Pulse: 89 84 82 87  Resp:  (!) 25 18 (!) 23  Temp:  98.7 F (37.1 C) 98.4 F (36.9 C) 97.9 F (36.6 C)  TempSrc:  Oral Oral Oral  SpO2:  96% 97% 98%  Weight:      Height:        Intake/Output Summary (Last 24 hours) at 05/06/16 1114 Last data filed at 05/06/16 0851  Gross per 24 hour  Intake          2450.75 ml  Output              625 ml  Net          1825.75 ml   Filed Weights   05/02/16 1835  Weight: 72.9 kg (160 lb 11.5 oz)    Examination: General exam: Appears calm and comfortable  Respiratory system: Clear to auscultation. Respiratory effort normal. Cardiovascular system: S1 & S2 heard, RRR. No JVD, murmurs, rubs, gallops or clicks. No pedal edema. Gastrointestinal system: Abdomen is nondistended, soft and nontender. No organomegaly or masses felt. Normal bowel sounds heard. Central nervous system: Alert and oriented. No focal neurological deficits. Extremities: Symmetric 5 x 5 power. Skin: No rashes, lesions or ulcers Psychiatry: Judgement and insight appear normal. Mood & affect appropriate.   Data Reviewed: I have personally reviewed following labs and imaging studies  CBC:  Recent Labs Lab 05/02/16 1146 05/03/16 0242 05/04/16 0355 05/05/16 0545 05/06/16 0500  WBC 26.0* 23.6* 21.0* 18.2* 15.5*  NEUTROABS 22.3*  --   --   --  11.8*  HGB 12.9* 11.0* 11.1* 11.0* 11.1*  HCT 38.6* 33.9* 34.4* 33.2* 34.5*  MCV 96.3 96.0 96.1 95.4 96.1  PLT 231 216 233 223 246   Basic Metabolic Panel:  Recent Labs Lab 05/02/16 1146 05/03/16 0242  05/04/16 0355 05/05/16 0545 05/06/16 0500  NA 133* 136 135 137 137  K 4.3 4.2 3.8 3.6 3.2*  CL 101 103 105 106 105  CO2 23 23 22 22 24   GLUCOSE 174* 108* 104* 112* 201*  BUN 23* 22* 18 24* 23*  CREATININE 0.91 0.81 0.77 0.86 0.65  CALCIUM 8.9 8.0* 8.1* 8.1* 7.9*  MG  --   --   --   --  1.8  PHOS  --   --   --   --  2.1*   GFR: Estimated Creatinine Clearance: 73.6 mL/min (by C-G formula based on SCr of 0.65 mg/dL). Liver Function Tests:  Recent Labs Lab 05/02/16 1146 05/03/16 0242 05/04/16 0355 05/05/16 0545 05/06/16 0500  AST 27 23 20 24 30   ALT 25 20 19 17 18   ALKPHOS 88 76 76 73 91  BILITOT 1.5* 1.3* 1.4* 1.4* 0.6  PROT 5.9* 5.1* 5.0* 4.9* 4.7*  ALBUMIN 2.3* 1.9* 1.7* 1.7* 1.7*    Recent Labs Lab 05/02/16 1729  LIPASE 18   No results for input(s): AMMONIA in the last  168 hours. Coagulation Profile:  Recent Labs Lab 05/03/16 0242  INR 1.43   Cardiac Enzymes:  Recent Labs Lab 05/02/16 1729 05/02/16 2305 05/03/16 0242  TROPONINI 0.03* 0.04* 0.03*   BNP (last 3 results) No results for input(s): PROBNP in the last 8760 hours. HbA1C: No results for input(s): HGBA1C in the last 72 hours. CBG:  Recent Labs Lab 05/05/16 1638 05/05/16 2139 05/06/16 0119 05/06/16 0503 05/06/16 0737  GLUCAP 117* 172* 184* 182* 178*   Lipid Profile:  Recent Labs  05/06/16 0500  TRIG 78   Thyroid Function Tests:  Recent Labs  05/05/16 0545  TSH 2.319   Anemia Panel: No results for input(s): VITAMINB12, FOLATE, FERRITIN, TIBC, IRON, RETICCTPCT in the last 72 hours. Urine analysis:    Component Value Date/Time   COLORURINE AMBER (A) 05/02/2016 1149   APPEARANCEUR CLOUDY (A) 05/02/2016 1149   LABSPEC 1.024 05/02/2016 1149   PHURINE 6.0 05/02/2016 1149   GLUCOSEU NEGATIVE 05/02/2016 1149   HGBUR NEGATIVE 05/02/2016 1149   BILIRUBINUR SMALL (A) 05/02/2016 1149   KETONESUR NEGATIVE 05/02/2016 1149   PROTEINUR NEGATIVE 05/02/2016 1149   UROBILINOGEN 0.2  03/08/2010 1026   NITRITE NEGATIVE 05/02/2016 1149   LEUKOCYTESUR SMALL (A) 05/02/2016 1149   Sepsis Labs: @LABRCNTIP (procalcitonin:4,lacticidven:4)  ) Recent Results (from the past 240 hour(s))  Culture, blood (Routine x 2)     Status: None (Preliminary result)   Collection Time: 05/02/16 11:45 AM  Result Value Ref Range Status   Specimen Description BLOOD LEFT ANTECUBITAL  Final   Special Requests BOTTLES DRAWN AEROBIC AND ANAEROBIC 5CC  Final   Culture NO GROWTH 4 DAYS  Final   Report Status PENDING  Incomplete  Urine culture     Status: None   Collection Time: 05/02/16 11:49 AM  Result Value Ref Range Status   Specimen Description URINE, CLEAN CATCH  Final   Special Requests NONE  Final   Culture NO GROWTH  Final   Report Status 05/03/2016 FINAL  Final  Culture, blood (Routine x 2)     Status: None (Preliminary result)   Collection Time: 05/02/16 12:40 PM  Result Value Ref Range Status   Specimen Description BLOOD RIGHT ANTECUBITAL  Final   Special Requests BOTTLES DRAWN AEROBIC AND ANAEROBIC  Final   Culture NO GROWTH 4 DAYS  Final   Report Status PENDING  Incomplete  MRSA PCR Screening     Status: None   Collection Time: 05/02/16  6:32 PM  Result Value Ref Range Status   MRSA by PCR NEGATIVE NEGATIVE Final    Comment:        The GeneXpert MRSA Assay (FDA approved for NASAL specimens only), is one component of a comprehensive MRSA colonization surveillance program. It is not intended to diagnose MRSA infection nor to guide or monitor treatment for MRSA infections.   Aerobic/Anaerobic Culture (surgical/deep wound)     Status: None (Preliminary result)   Collection Time: 05/03/16  2:10 PM  Result Value Ref Range Status   Specimen Description DRAINAGE  Final   Special Requests Normal  Final   Gram Stain   Final    ABUNDANT WBC PRESENT, PREDOMINANTLY PMN MODERATE GRAM NEGATIVE RODS    Culture   Final    MODERATE GRAM NEGATIVE RODS IDENTIFICATION AND  SUSCEPTIBILITIES TO FOLLOW    Report Status PENDING  Incomplete     Invalid input(s): PROCALCITONIN, LACTICACIDVEN   Radiology Studies: No results found.      Scheduled Meds: . atorvastatin  10 mg Oral q1800  . chlorhexidine  15 mL Mouth Rinse BID  . fluconazole (DIFLUCAN) IV  100 mg Intravenous Q24H  . imipenem-cilastatin  500 mg Intravenous Q6H  . insulin aspart  0-15 Units Subcutaneous Q4H  . lipase/protease/amylase  36,000 Units Oral TID AC  . mouth rinse  15 mL Mouth Rinse q12n4p  . metoprolol  100 mg Oral BID  . pantoprazole (PROTONIX) IV  40 mg Intravenous Q12H  . potassium chloride  20 mEq Oral Q4H  . sodium chloride flush  10-40 mL Intracatheter Q12H  . sodium chloride flush  3 mL Intravenous Q12H   Continuous Infusions: . sodium chloride 35 mL/hr at 05/05/16 1727  . diltiazem (CARDIZEM) infusion    . Marland Kitchen.TPN (CLINIMIX-E) Adult 40 mL/hr at 05/05/16 1703   And  . fat emulsion 120 mL (05/05/16 1703)     LOS: 4 days    Time spent: 35 minutes    Zayla Agar A, MD [facility] Pager [phone]  If 7PM-7AM, please contact night-coverage [email or web address] Password [email or web address] 05/06/2016, 11:14 AM

## 2016-05-06 NOTE — Progress Notes (Signed)
Pharmacy Antibiotic Note  Colin West is a 82 y.o. male admitted on 05/02/2016 with intra-abdominal infection.  Pharmacy has been consulted for Primaxin dosing.  Plan: -Primaxin 500mg  IV q6h -follow c/s, renal function, clinical progression  Height: 5\' 9"  (175.3 cm) Weight: 160 lb 11.5 oz (72.9 kg) IBW/kg (Calculated) : 70.7  Temp (24hrs), Avg:98.4 F (36.9 C), Min:97.8 F (36.6 C), Max:99 F (37.2 C)   Recent Labs Lab 05/02/16 1146 05/02/16 1224 05/02/16 1512 05/03/16 0242 05/04/16 0355 05/05/16 0545 05/06/16 0500  WBC 26.0*  --   --  23.6* 21.0* 18.2* 15.5*  CREATININE 0.91  --   --  0.81 0.77 0.86 0.65  LATICACIDVEN  --  2.28* 1.86  --   --   --   --     Estimated Creatinine Clearance: 73.6 mL/min (by C-G formula based on SCr of 0.65 mg/dL).    No Known Allergies   Isaac BlissMichael Autymn Omlor, PharmD, BCPS, [facility] Clinical Pharmacist Phone [phone] 05/06/2016 11:16 AM

## 2016-05-06 NOTE — Progress Notes (Signed)
  Subjective: Pt doing well TNA started States he's ready to be better and impatient  Objective: Vital signs in last 24 hours: Temp:  [97.8 F (36.6 C)-99 F (37.2 C)] 97.9 F (36.6 C) (11/21 0740) Pulse Rate:  [72-89] 87 (11/21 0740) Resp:  [18-25] 23 (11/21 0740) BP: (141-157)/(68-84) 155/84 (11/21 0740) SpO2:  [94 %-98 %] 98 % (11/21 0740) Last BM Date: 05/05/16  Intake/Output from previous day: 11/20 0701 - 11/21 0700 In: 2640.8 [I.V.:2190.8; IV Piggyback:450] Out: 525 [Urine:525] Intake/Output this shift: Total I/O In: 260 [I.V.:160; IV Piggyback:100] Out: 100 [Urine:100]  General appearance: alert and cooperative GI: soft, distended, nttp, hypoactive BS  Lab Results:   Recent Labs  05/05/16 0545 05/06/16 0500  WBC 18.2* 15.5*  HGB 11.0* 11.1*  HCT 33.2* 34.5*  PLT 223 246   BMET  Recent Labs  05/05/16 0545 05/06/16 0500  NA 137 137  K 3.6 3.2*  CL 106 105  CO2 22 24  GLUCOSE 112* 201*  BUN 24* 23*  CREATININE 0.86 0.65  CALCIUM 8.1* 7.9*   PT/INR No results for input(s): LABPROT, INR in the last 72 hours. ABG No results for input(s): PHART, HCO3 in the last 72 hours.  Invalid input(s): PCO2, PO2  Studies/Results: No results found.  Anti-infectives: Anti-infectives    Start     Dose/Rate Route Frequency Ordered Stop   05/05/16 1230  fluconazole (DIFLUCAN) IVPB 100 mg     100 mg 50 mL/hr over 60 Minutes Intravenous Every 24 hours 05/05/16 1135     05/03/16 1200  imipenem-cilastatin (PRIMAXIN) 500 mg in sodium chloride 0.9 % 100 mL IVPB     500 mg 200 mL/hr over 30 Minutes Intravenous Every 6 hours 05/03/16 1122     05/03/16 0030  piperacillin-tazobactam (ZOSYN) IVPB 3.375 g  Status:  Discontinued     3.375 g 12.5 mL/hr over 240 Minutes Intravenous Every 8 hours 05/02/16 1647 05/03/16 1101   05/02/16 1930  fluconazole (DIFLUCAN) tablet 100 mg  Status:  Discontinued     100 mg Oral Daily 05/02/16 1816 05/05/16 1135   05/02/16 1600   piperacillin-tazobactam (ZOSYN) IVPB 3.375 g     3.375 g 100 mL/hr over 30 Minutes Intravenous  Once 05/02/16 1548 05/02/16 1720      Assessment/Plan: Gallstone pancreatitis Pancreatic abscess Severe protein calorie malnutrition E coli pyelonephritis weakness Sacral ulcer CHF  -Con't TNA -Will need gallbladder out, but pt has barriers to overcome.  -Will need improved nutrition, improvement/resolution of pancreatitis, and cards has recommended stress test.  -Cultures= GNR Continue primaxin/fluconazole. Will follow.   LOS: 4 days    Marigene EhlersRamirez Jr., [facility] 05/06/2016

## 2016-05-06 NOTE — Care Management Important Message (Signed)
Important Message  Patient Details  Name: Colin West MRN: [id number] Date of Birth: 11-Jun-1934   Medicare Important Message Given:  Yes    [name] 05/06/2016, 10:12 AM

## 2016-05-07 DIAGNOSIS — I420 Dilated cardiomyopathy: Secondary | ICD-10-CM

## 2016-05-07 DIAGNOSIS — I480 Paroxysmal atrial fibrillation: Secondary | ICD-10-CM

## 2016-05-07 DIAGNOSIS — I10 Essential (primary) hypertension: Secondary | ICD-10-CM

## 2016-05-07 DIAGNOSIS — Z8719 Personal history of other diseases of the digestive system: Secondary | ICD-10-CM

## 2016-05-07 DIAGNOSIS — Z01810 Encounter for preprocedural cardiovascular examination: Secondary | ICD-10-CM

## 2016-05-07 DIAGNOSIS — K859 Acute pancreatitis without necrosis or infection, unspecified: Secondary | ICD-10-CM

## 2016-05-07 LAB — GLUCOSE, CAPILLARY
GLUCOSE-CAPILLARY: 500 mg/dL — AB (ref 65–99)
GLUCOSE-CAPILLARY: 159 mg/dL — AB (ref 65–99)
GLUCOSE-CAPILLARY: 188 mg/dL — AB (ref 65–99)
Glucose-Capillary: 147 mg/dL — ABNORMAL HIGH (ref 65–99)
Glucose-Capillary: 152 mg/dL — ABNORMAL HIGH (ref 65–99)
Glucose-Capillary: 170 mg/dL — ABNORMAL HIGH (ref 65–99)
Glucose-Capillary: 185 mg/dL — ABNORMAL HIGH (ref 65–99)

## 2016-05-07 LAB — BASIC METABOLIC PANEL
ANION GAP: 6 (ref 5–15)
BUN: 18 mg/dL (ref 6–20)
CALCIUM: 8 mg/dL — AB (ref 8.9–10.3)
CO2: 25 mmol/L (ref 22–32)
Chloride: 106 mmol/L (ref 101–111)
Creatinine, Ser: 0.51 mg/dL — ABNORMAL LOW (ref 0.61–1.24)
GFR calc Af Amer: >60 mL/min (ref >60)
Glucose, Bld: 150 mg/dL — ABNORMAL HIGH (ref 65–99)
POTASSIUM: 3.5 mmol/L (ref 3.5–5.1)
SODIUM: 137 mmol/L (ref 135–145)

## 2016-05-07 LAB — CULTURE, BLOOD (ROUTINE X 2)
Culture: NO GROWTH
Culture: NO GROWTH

## 2016-05-07 LAB — PHOSPHORUS: PHOSPHORUS: 2.8 mg/dL (ref 2.5–4.6)

## 2016-05-07 MED ORDER — FAT EMULSION 20 % IV EMUL
120.0000 mL | INTRAVENOUS | Status: AC
  Administered 2016-05-07: 120 mL via INTRAVENOUS
  Filled 2016-05-07: qty 200

## 2016-05-07 MED ORDER — SODIUM CHLORIDE 0.9 % IV SOLN
30.0000 meq | Freq: Once | INTRAVENOUS | Status: AC
  Administered 2016-05-07: 30 meq via INTRAVENOUS
  Filled 2016-05-07: qty 15

## 2016-05-07 MED ORDER — TRACE MINERALS CR-CU-MN-SE-ZN 10-1000-500-60 MCG/ML IV SOLN
INTRAVENOUS | Status: AC
  Administered 2016-05-07: 18:00:00 via INTRAVENOUS
  Filled 2016-05-07: qty 960

## 2016-05-07 MED ORDER — TRACE MINERALS CR-CU-MN-SE-ZN 10-1000-500-60 MCG/ML IV SOLN
INTRAVENOUS | Status: DC
  Filled 2016-05-07: qty 960

## 2016-05-07 MED ORDER — FAT EMULSION 20 % IV EMUL
120.0000 mL | INTRAVENOUS | Status: DC
  Filled 2016-05-07: qty 200

## 2016-05-07 MED ORDER — ZOLPIDEM TARTRATE 5 MG PO TABS
2.5000 mg | ORAL_TABLET | Freq: Once | ORAL | Status: AC
  Administered 2016-05-07: 2.5 mg via ORAL
  Filled 2016-05-07: qty 1

## 2016-05-07 MED ORDER — VITAL AF 1.2 CAL PO LIQD
1000.0000 mL | ORAL | Status: DC
  Filled 2016-05-07 (×4): qty 1000

## 2016-05-07 NOTE — Progress Notes (Signed)
PROGRESS NOTE  Colin West  ZOX:[id number] DOB: 09-06-1933 DOA: 05/02/2016 PCP: [name], MD Outpatient Specialists:  Subjective: Tolerating tube feeds, no A. fib overnight.    Brief Narrative:  Colin West is a 82 y.o. male  with complex history recently hospitalized from 10/8-10/12 for hemorrhagic pancreatitis from gallblader source, and on  11/7-11/13 for Severe Sepsis due to E.Coli pyelonephritis, discharged to  [facility] on oral antibiotics, brought to the emergency department with worsening generalized weakness, today  evere enough with  associated nausea and vomiting. He denies shortness of breath or productive cough. He has been unable to ambulate due to weakness. He denies any abdominal pain, he has occasional nausea without vomiting, he denies any diarrhea or constipation. He has not been able to eat a normal meal, lost about 15 lbs in the last 2 months.  He reports frequent hiccups. He denies any lower extremity swelling. Due to prolonged bed rest, he has developed some the ulcers in the sacrum and gluteal  areas as well as some minimal heel blanching hese is being cared at the facility as well. 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 -CT scan showed multiple peripancreatic abscesses, was recently in the hospital from 10/8-10/12 for hemorrhagic pancreatitis from gallbladder source. -Currently on Primaxin and Diflucan, likely he will need prolonged course of 3 weeks of  antibiotics. -Patient developed pancreatic necrosis while he was eating, need to be strict NPO.  Continue DNA for surgery -Discussed with Dr. Derrell Lolling, patient's nutritional and functional status needs to improve prior to consider surgery. -Try to advance to feeding and patient can probably go to the nursing home and come back in 1-2 weeks for surgery. -Need surgery to take the gallbladder out. Need preop clearance from cardiology, last seen by them 11/13.  will also repeat echo to see if EF has improved , as last echo was done in the setting of sepsis   Protein calorie malnutrition, severe  -Lost 20 pounds in the past 2 months, albumin is 2.3. Prealbumin is <5 -Per general surgery recommendation try postpyloric enteral nutrition prior to TPN. Currently on both TPN and enteric feeding -CORTRAK, started Tube  feeding    Atrial fibrillation, transient -Had run of atrial fibrillation with heart rate in the 130s, back to sinus rhythm with IV metoprolol. -Not sure if he needs anticoagulation for transient transient A. fib.  TSH  2.319  Type II Diabetes Current blood sugar level is 174 Currently nothing by mouth, per general surgery once into postpyloric feeding. Await their recommendations.   Hypertension BP 125/65   Pulse 86   Controlled Continue home anti-hypertensive medications    Hiccups This is likely secondary to the irritation of the diaphragm from pancreatic abscess, on Reglan.  Hyperlipidemia Continue home statins   Dilated Cardiomyopathy in view of recent sepsis 2 D echo recently shows EF 30-35 % 11/10,    Will be very careful with IV fluids.  ?Dementia -Patient was not able to remember what I told him yesterday. Did not remember why  he is in the hospital.    DVT prophylaxis:  Code Status: Full Code Family Communication:  Disposition Plan: refusing SNF, may need to stay until surgery  Diet: Diet NPO time specified Except for: Sips with Meds TPN  (CLINIMIX-E) Adult TPN (CLINIMIX-E) Adult  Consultants:   Gen. surgery.  Interventional radiology  Procedures:   Aspiration of abscess on 05/04/2016  Antimicrobials:   Zosyn and vancomycin changed to Primaxin on 05/03/2016  Objective: Vitals:   05/07/16 0013 05/07/16 0403 05/07/16 0600 05/07/16 0754  BP: 117/67 132/73 133/76 (!) 145/84  Pulse: 79 81 78 88  Resp: 18 18 18  (!) 26  Temp: 97.9 F (36.6 C) 97.6 F (36.4 C)  98 F (36.7 C)  TempSrc: Oral Oral  Oral  SpO2: 95% 96% 93% 98%  Weight:  71.2 kg (157 lb)    Height:        Intake/Output Summary (Last 24 hours) at 05/07/16 1106 Last data filed at 05/07/16 1000  Gross per 24 hour  Intake           2407.5 ml  Output              600 ml  Net           1807.5 ml   Filed Weights   05/02/16 1835 05/07/16 0403  Weight: 72.9 kg (160 lb 11.5 oz) 71.2 kg (157 lb)    Examination: General exam: Appears calm and comfortable  Respiratory system: Clear to auscultation. Respiratory effort normal. Cardiovascular system: S1 & S2 heard, RRR. No JVD, murmurs, rubs, gallops or clicks. No pedal edema. Gastrointestinal system: Abdomen is nondistended, soft and nontender. No organomegaly or masses felt. Normal bowel sounds heard. Central nervous system: Alert and oriented. No focal neurological deficits. Extremities: Symmetric 5 x 5 power. Skin: No rashes, lesions or ulcers Psychiatry: Judgement and insight appear normal. Mood & affect appropriate.   Data Reviewed: I have personally reviewed following labs and imaging studies  CBC:  Recent Labs Lab 05/02/16 1146 05/03/16 0242 05/04/16 0355 05/05/16 0545 05/06/16 0500  WBC 26.0* 23.6* 21.0* 18.2* 15.5*  NEUTROABS 22.3*  --   --   --  11.8*  HGB 12.9* 11.0* 11.1* 11.0* 11.1*  HCT 38.6* 33.9* 34.4* 33.2* 34.5*  MCV 96.3 96.0 96.1 95.4 96.1  PLT 231 216 233 223 246   Basic Metabolic Panel:  Recent Labs Lab 05/03/16 0242 05/04/16 0355 05/05/16 0545 05/06/16 0500  05/07/16 0425  NA 136 135 137 137 137  K 4.2 3.8 3.6 3.2* 3.5  CL 103 105 106 105 106  CO2 23 22 22 24 25   GLUCOSE 108* 104* 112* 201* 150*  BUN 22* 18 24* 23* 18  CREATININE 0.81 0.77 0.86 0.65 0.51*  CALCIUM 8.0* 8.1* 8.1* 7.9* 8.0*  MG  --   --   --  1.8  --   PHOS  --   --   --  2.1* 2.8   GFR: Estimated Creatinine Clearance: 73.6 mL/min (by C-G formula based on SCr of 0.51 mg/dL (L)). Liver Function Tests:  Recent Labs Lab 05/02/16 1146 05/03/16 0242 05/04/16 0355 05/05/16 0545 05/06/16 0500  AST 27 23 20 24 30   ALT 25 20 19 17 18   ALKPHOS 88 76 76 73 91  BILITOT 1.5* 1.3* 1.4* 1.4* 0.6  PROT 5.9* 5.1* 5.0* 4.9* 4.7*  ALBUMIN 2.3* 1.9* 1.7* 1.7* 1.7*    Recent Labs Lab 05/02/16 1729  LIPASE 18   No results for input(s):  AMMONIA in the last 168 hours. Coagulation Profile:  Recent Labs Lab 05/03/16 0242  INR 1.43   Cardiac Enzymes:  Recent Labs Lab 05/02/16 1729 05/02/16 2305 05/03/16 0242  TROPONINI 0.03* 0.04* 0.03*   BNP (last 3 results) No results for input(s): PROBNP in the last 8760 hours. HbA1C: No results for input(s): HGBA1C in the last 72 hours. CBG:  Recent Labs Lab 05/06/16 2007 05/06/16 2353 05/07/16 0407 05/07/16 0409 05/07/16 0752  GLUCAP 158* 181* 500* 147* 152*   Lipid Profile:  Recent Labs  05/06/16 0500  TRIG 78   Thyroid Function Tests:  Recent Labs  05/05/16 0545  TSH 2.319   Anemia Panel: No results for input(s): VITAMINB12, FOLATE, FERRITIN, TIBC, IRON, RETICCTPCT in the last 72 hours. Urine analysis:    Component Value Date/Time   COLORURINE AMBER (A) 05/02/2016 1149   APPEARANCEUR CLOUDY (A) 05/02/2016 1149   LABSPEC 1.024 05/02/2016 1149   PHURINE 6.0 05/02/2016 1149   GLUCOSEU NEGATIVE 05/02/2016 1149   HGBUR NEGATIVE 05/02/2016 1149   BILIRUBINUR SMALL (A) 05/02/2016 1149   KETONESUR NEGATIVE 05/02/2016 1149   PROTEINUR NEGATIVE 05/02/2016 1149   UROBILINOGEN 0.2 03/08/2010 1026   NITRITE  NEGATIVE 05/02/2016 1149   LEUKOCYTESUR SMALL (A) 05/02/2016 1149   Sepsis Labs: @LABRCNTIP (procalcitonin:4,lacticidven:4)  ) Recent Results (from the past 240 hour(s))  Culture, blood (Routine x 2)     Status: None (Preliminary result)   Collection Time: 05/02/16 11:45 AM  Result Value Ref Range Status   Specimen Description BLOOD LEFT ANTECUBITAL  Final   Special Requests BOTTLES DRAWN AEROBIC AND ANAEROBIC 5CC  Final   Culture NO GROWTH 4 DAYS  Final   Report Status PENDING  Incomplete  Urine culture     Status: None   Collection Time: 05/02/16 11:49 AM  Result Value Ref Range Status   Specimen Description URINE, CLEAN CATCH  Final   Special Requests NONE  Final   Culture NO GROWTH  Final   Report Status 05/03/2016 FINAL  Final  Culture, blood (Routine x 2)     Status: None (Preliminary result)   Collection Time: 05/02/16 12:40 PM  Result Value Ref Range Status   Specimen Description BLOOD RIGHT ANTECUBITAL  Final   Special Requests BOTTLES DRAWN AEROBIC AND ANAEROBIC 10ML  Final   Culture NO GROWTH 4 DAYS  Final   Report Status PENDING  Incomplete  MRSA PCR Screening     Status: None   Collection Time: 05/02/16  6:32 PM  Result Value Ref Range Status   MRSA by PCR NEGATIVE NEGATIVE Final    Comment:        The GeneXpert MRSA Assay (FDA approved for NASAL specimens only), is one component of a comprehensive MRSA colonization surveillance program. It is not intended to diagnose MRSA infection nor to guide or monitor treatment for MRSA infections.   Aerobic/Anaerobic Culture (surgical/deep wound)     Status: None (Preliminary result)   Collection Time: 05/03/16  2:10 PM  Result Value Ref Range Status   Specimen Description DRAINAGE  Final   Special Requests Normal  Final   Gram Stain   Final    ABUNDANT WBC PRESENT, PREDOMINANTLY PMN MODERATE GRAM NEGATIVE RODS    Culture   Final    MODERATE ESCHERICHIA COLI NO ANAEROBES ISOLATED; CULTURE IN PROGRESS FOR 5  DAYS    Report Status PENDING  Incomplete   Organism ID, Bacteria ESCHERICHIA COLI  Final      Susceptibility  Escherichia coli - MIC*    AMPICILLIN >=32 RESISTANT Resistant     CEFAZOLIN <=4 SENSITIVE Sensitive     CEFEPIME <=1 SENSITIVE Sensitive     CEFTAZIDIME <=1 SENSITIVE Sensitive     CEFTRIAXONE <=1 SENSITIVE Sensitive     CIPROFLOXACIN >=4 RESISTANT Resistant     GENTAMICIN <=1 SENSITIVE Sensitive     IMIPENEM <=0.25 SENSITIVE Sensitive     TRIMETH/SULFA >=320 RESISTANT Resistant     AMPICILLIN/SULBACTAM >=32 RESISTANT Resistant     PIP/TAZO <=4 SENSITIVE Sensitive     Extended ESBL NEGATIVE Sensitive     * MODERATE ESCHERICHIA COLI     Invalid input(s): PROCALCITONIN, LACTICACIDVEN   Radiology Studies: Dg Abd Portable 1v  Result Date: 05/06/2016 CLINICAL DATA:  Feeding tube placement EXAM: PORTABLE ABDOMEN - 1 VIEW COMPARISON:  05/03/2016 FINDINGS: Upper abdomen is non included. Nonobstructed gas pattern. Esophageal tube tip overlies the junction of second and third portion of duodenum. IMPRESSION: Esophageal tube tip overlies the junction of second and third portion of duodenum. Electronically Signed   By: Jasmine PangKim  Fujinaga M.D.   On: 05/06/2016 19:09        Scheduled Meds: . atorvastatin  10 mg Oral q1800  . chlorhexidine  15 mL Mouth Rinse BID  . feeding supplement (VITAL AF 1.2 CAL)  1,000 mL Per Tube Q24H  . fluconazole (DIFLUCAN) IV  100 mg Intravenous Q24H  . imipenem-cilastatin  500 mg Intravenous Q6H  . insulin aspart  0-15 Units Subcutaneous Q4H  . lipase/protease/amylase  36,000 Units Oral TID AC  . mouth rinse  15 mL Mouth Rinse q12n4p  . metoprolol  100 mg Oral BID  . pantoprazole (PROTONIX) IV  40 mg Intravenous Q12H  . potassium chloride (KCL MULTIRUN) 30 mEq in 265 mL IVPB  30 mEq Intravenous Once  . sodium chloride flush  10-40 mL Intracatheter Q12H  . sodium chloride flush  3 mL Intravenous Q12H   Continuous Infusions: . sodium chloride 35  mL/hr at 05/05/16 1727  . diltiazem (CARDIZEM) infusion    . Marland Kitchen.TPN (CLINIMIX-E) Adult 40 mL/hr at 05/06/16 1749   And  . fat emulsion 120 mL (05/07/16 1000)  . Marland Kitchen.TPN (CLINIMIX-E) Adult     And  . fat emulsion       LOS: 5 days    Time spent: 35 minutes    Richarda OverlieABROL,Aishia Barkey, MD [facility] Pager [phone]  If 7PM-7AM, please contact night-coverage [email or web address] Password [email or web address] 05/07/2016, 11:06 AM

## 2016-05-07 NOTE — Consult Note (Signed)
Cardiology Consult    Patient ID: Colin West MRN: [id number], DOB/AGE: 80-05-1934   Admit date: 05/02/2016 Date of Consult: 05/07/2016  Primary Physician: [name], MD Reason for Consult: surgical clearance  Primary Cardiologist: New Requesting Provider: Dr. Susie Cassette  Patient Profile    Mr. Colin West is a 81 year old male with a past medical history of DM, HTN, HLD and carotid artery disease. He was recently hospitalized from 10/8-10/12 with hemorrhagic pancreatitis from gallbladder source and hospitalized from 11/7-11/13 for severe sepsis due to E. Coli pyelonephritis. He presented to the ED on 05/02/16 with weakness,nausea, vomiting. CT showed multiple pancreatic abscesses, cardiology was consulted regarding evaluation of cardiac risk for surgery.   History of Present Illness   [name] was admitted with nausea, vomiting and severe weakness. His CT showed multifocal abscesses and probable areas of necrosis in his pancreas. He is in need of surgery, however surgery has said he is not a candidate at this time. He was started on post pyloric enteral nutrition and started on IV antibiotics.   Echo was done last admission and showed EF of 30-35% with diffuse hypokinesis more pronounced in the inferior, inferolateral and inferoseptal walls. There is no prior Echo to compare to.  He has never had an ischemic evaluation and does not follow with a Cardiologist. He has been inactive since his hospitalization in October. He has developed multiple bed sores. He is lethargic and chronically ill appearing. He denies ever having any chest pain or SOB. He tells me that he is very tired.   Patient also had a brief episode of atrial fibrillation with RVR. He was started on a Diltazem gtt and converted to NSR. No anticoagulation was started.   Past Medical History   Past Medical History:  Diagnosis Date  . Anxiety   . Carotid artery stenosis    a. < 40% bilateral stenosis by [place] in  2014  . Depression    denies  . Diabetes mellitus    denies  . History of kidney stones   . Hyperlipidemia   . Hypertension     Past Surgical History:  Procedure Laterality Date  . ELBOW ARTHROSCOPY Right   . INGUINAL HERNIA REPAIR Left Sept 2006   Dr Corliss Skains  . INGUINAL HERNIA REPAIR Bilateral 06/01/2013   Procedure: LAPAROSCOPIC EXPLORATION BILATERAL INGUINAL HERNIA REPAIR;  Surgeon: Ardeth Sportsman, MD;  Location: [facility] OR;  Service: General;  Laterality: Bilateral;  Left Femoral, Left Recurrent inguinal, Right Inguinal  . INSERTION OF MESH Bilateral 06/01/2013   Procedure: INSERTION OF MESH;  Surgeon: Ardeth Sportsman, MD;  Location: [facility] OR;  Service: General;  Laterality: Bilateral;  Left Femoral, Left Recurrent inguinal, Right Inguinal  . LAPAROSCOPIC LYSIS OF ADHESIONS Bilateral 06/01/2013   Procedure: LAPAROSCOPIC LYSIS OF ADHESIONS;  Surgeon: Ardeth Sportsman, MD;  Location: [facility] OR;  Service: General;  Laterality: Bilateral;  . LUNG REMOVAL, PARTIAL  Sept. 2011    Dr. Edwyna Shell  . ROTATOR CUFF REPAIR     Bilateral repair     Allergies  No Known Allergies  Inpatient Medications    . atorvastatin  10 mg Oral q1800  . chlorhexidine  15 mL Mouth Rinse BID  . fluconazole (DIFLUCAN) IV  100 mg Intravenous Q24H  . imipenem-cilastatin  500 mg Intravenous Q6H  . insulin aspart  0-15 Units Subcutaneous Q4H  . lipase/protease/amylase  36,000 Units Oral TID AC  . mouth rinse  15 mL Mouth Rinse q12n4p  .  metoprolol  100 mg Oral BID  . pantoprazole (PROTONIX) IV  40 mg Intravenous Q12H  . sodium chloride flush  10-40 mL Intracatheter Q12H  . sodium chloride flush  3 mL Intravenous Q12H    Family History    Family History  Problem Relation Age of Onset  . Heart disease Brother     Heart Disease before age 80  . Heart attack Brother     Social History    Social History   Social History  . Marital status: Divorced    Spouse name: N/A  . Number of children: N/A  . Years of  education: N/A   Occupational History  . Not on file.   Social History Main Topics  . Smoking status: Former Smoker    Quit date: 05/31/1973  . Smokeless tobacco: Never Used  . Alcohol use No  . Drug use: No  . Sexual activity: Not on file   Other Topics Concern  . Not on file   Social History Narrative  . No narrative on file     Review of Systems    General:  No chills, fever, night sweats or weight changes.  Cardiovascular:  No chest pain, dyspnea on exertion, edema, orthopnea, palpitations, paroxysmal nocturnal dyspnea. Dermatological: No rash, lesions/masses Respiratory: No cough, dyspnea Urologic: No hematuria, dysuria Abdominal:   + nausea, vomiting, diarrhea, bright red blood per rectum, melena, or hematemesis Neurologic:  No visual changes, wkns, changes in mental status. All other systems reviewed and are otherwise negative except as noted above.  Physical Exam    Blood pressure 122/69, pulse 88, temperature 97.7 F (36.5 C), temperature source Oral, resp. rate (!) 26, height 5\' 9"  (1.753 m), weight 157 lb (71.2 kg), SpO2 98 %.  General:Ill appearing elderly male  Psych: Normal affect. Neuro: Alert and oriented X 3, lethargic. Moves all extremities spontaneously. HEENT: Normal, NG tube in place.   Neck: Supple without bruits or JVD. Lungs:  Resp regular and unlabored, CTA. Heart: RRR no s3, s4, or murmurs. Abdomen: Soft, tender, non-distended, BS + x 4.  Extremities: No clubbing, cyanosis or edema. DP/PT/Radials 2+ and equal bilaterally.  Labs     Lab Results  Component Value Date   WBC 15.5 (H) 05/06/2016   HGB 11.1 (L) 05/06/2016   HCT 34.5 (L) 05/06/2016   MCV 96.1 05/06/2016   PLT 246 05/06/2016    Recent Labs Lab 05/06/16 0500 05/07/16 0425  NA 137 137  K 3.2* 3.5  CL 105 106  CO2 24 25  BUN 23* 18  CREATININE 0.65 0.51*  CALCIUM 7.9* 8.0*  PROT 4.7*  --   BILITOT 0.6  --   ALKPHOS 91  --   ALT 18  --   AST 30  --   GLUCOSE 201*  150*   Lab Results  Component Value Date   TRIG 78 05/06/2016   No results found for: Plessen Eye LLCDDIMER   Radiology Studies    Ct Abdomen Pelvis Wo Contrast  Result Date: 04/22/2016 CLINICAL DATA:  Leukocytosis, sepsis. History of recent pancreatitis. EXAM: CT ABDOMEN AND PELVIS WITHOUT CONTRAST TECHNIQUE: Multidetector CT imaging of the abdomen and pelvis was performed following the standard protocol without IV contrast. COMPARISON:  CT from 04/03/2016 FINDINGS: Lower chest: Bibasilar atelectasis. Stable scarring at the right base. No pneumonic consolidation or pneumothorax. Old right sixth and seventh rib fractures. Normal size cardiac chambers without significant pericardial effusion. Coronary arteriosclerosis is seen. Hepatobiliary: Hepatic steatosis. No space-occupying mass of the  liver though assessment is limited by lack of IV contrast. Uncomplicated cholelithiasis. No biliary dilatation. Pancreas: The pancreas line is difficult to discretely identify due to the surrounding peripancreatic fatty inflammation and edema. This appears overall stable without apparent findings of pancreatic necrosis nor pseudocyst formation. Spleen: Normal in size without focal abnormality. Adrenals/Urinary Tract: Normal normal bilateral adrenal glands. Two punctate interpolar right renal calculi without obstructive uropathy. Apparent passage of the left UVJ stone since prior exam. No hydroureteronephrosis is noted. Stable bilateral perinephric fat stranding. Stomach/Bowel: Stomach is moderately distended with enteric contrast. No bowel obstruction or definite inflammation. Extensive colonic diverticulosis is noted along the colon, in particular involving the sigmoid colon. No findings of acute diverticulitis. No definite abscess. Colonic interposition of the liver seen. There is a normal-appearing appendix. Vascular/Lymphatic: Aortic and branch vessel atherosclerosis. No intraperitoneal, retroperitoneal, pelvic sidewall or  inguinal lymphadenopathy. Reproductive: The prostate is enlarged with calcifications as before at the junction of the central and peripheral zone. Other: No abdominal wall hernia or abnormality. No abdominopelvic ascites. Musculoskeletal: No worrisome lytic or sclerotic osseous lesions. IMPRESSION: 1. Stable sequela of pancreatitis without pancreatic necrosis, definite abscess on this unenhanced study nor pseudocyst formation. 2. Uncomplicated cholelithiasis. 3. Passage of left UVJ stone since prior exam. 4. Stable hepatic steatosis. 5. Abdominal aortic atherosclerosis. Electronically Signed   By: Tollie Eth M.D.   On: 04/22/2016 18:07   Dg Chest 1 View  Result Date: 04/26/2016 CLINICAL DATA:  Productive cough, diabetes, hypertension EXAM: CHEST 1 VIEW COMPARISON:  He 04/24/2016 FINDINGS: Right PICC line tip remains at the SVC RA junction level. Low lung volumes persist with slight interstitial prominence as before. Postop changes in the right lung. No developing focal pneumonia, collapse or consolidation. Negative for effusion or pneumothorax. Trachea is midline. IMPRESSION: Stable low lung volumes and slight interstitial prominence, nonspecific. Stable postop changes in the right lung No significant interval change. Electronically Signed   By: Judie Petit.  Shick M.D.   On: 04/26/2016 09:16   X-ray Chest Pa And Lateral  Result Date: 05/03/2016 CLINICAL DATA:  Preoperative examination.  PICC placement. EXAM: CHEST  2 VIEW COMPARISON:  PA and lateral chest 05/02/2016. FINDINGS: New right PICC is in place with the tip near the superior cavoatrial junction. Lung volumes are low but the lungs are clear. Heart size is normal. No pneumothorax or pleural effusion. Remote right rib fractures are seen. Postoperative change right lung apex and right shoulder noted. IMPRESSION: No active cardiopulmonary disease. Electronically Signed   By: Drusilla Kanner M.D.   On: 05/03/2016 09:26   Dg Chest 2 View  Result Date:  05/02/2016 CLINICAL DATA:  Weakness and fever EXAM: CHEST  2 VIEW COMPARISON:  04/26/2016; 03/28/2016; 05/31/2013 ; CT abdomen pelvis - 04/22/2016 FINDINGS: Grossly unchanged cardiac silhouette and mediastinal contours with atherosclerotic plaque within thoracic aorta. Lung volumes were reduced. Stable postsurgical change of the right upper lung with volume loss and elevation involving the anterior aspect the right hemidiaphragm. Note is again made of a interposed loop of hepatic flexure of the colon below the right hemidiaphragm as seen on prior abdominal CT. Grossly unchanged mild diffuse slightly nodular thickening of the pulmonary interstitium. No discrete focal airspace opacities. No pleural effusion or pneumothorax. No evidence of edema. No acute osseus abnormalities. Post right rotator cuff repair. Degenerative change within the lower thoracic and upper lumbar spine. IMPRESSION: 1. Similar findings of decreased lung volumes without acute cardiopulmonary disease. No new focal airspace opacities to suggest pneumonia. 2. Stable  postsurgical change of the right upper lung with associated volume loss. Electronically Signed   By: Simonne Come M.D.   On: 05/02/2016 13:27   Dg Chest 2 View  Result Date: 04/22/2016 CLINICAL DATA:  Strain fatigue and left G which began earlier today. History of diabetes and hypertension and unspecified lung surgery. Former smoker. EXAM: CHEST  2 VIEW COMPARISON:  Portable chest x-ray of March 28, 2016 FINDINGS: There is mild chronic volume loss on the right with elevation of the hemidiaphragm. The aerated portion of the right lung is clear. The left lung is clear. The heart and pulmonary vascularity are normal. There is old deformity of the posterior aspect of the right sixth and seventh ribs. There is degenerative change of the left shoulder. The thoracic spine exhibits mild multilevel degenerative disc space narrowing. IMPRESSION: Chronic volume loss on the right with mild  elevation of the hemidiaphragm. No evidence of pneumonia, CHF, nor other acute cardiopulmonary abnormality. Electronically Signed   By: Gautam Langhorst  [place] M.D.   On: 04/22/2016 15:39   Ct Abdomen Pelvis W Contrast  Result Date: 05/02/2016 CLINICAL DATA:  Hemorrhagic pancreatitis with periumbilical pain. EXAM: CT ABDOMEN AND PELVIS WITH CONTRAST TECHNIQUE: Multidetector CT imaging of the abdomen and pelvis was performed using the standard protocol following bolus administration of intravenous contrast. CONTRAST:  75 cc of Isovue-300 IV COMPARISON:  04/22/2016 FINDINGS: Lower chest: There is bibasilar dependent atelectasis. Three-vessel coronary arteriosclerosis. No pericardial effusion. Heart size is within normal to the extent visible. There is aortic atherosclerosis of the descending aorta. There is a small hiatal hernia. Hepatobiliary: No space-occupying mass or abscess of the liver. Slight surface nodularity of the liver consistent cirrhosis. Gallstones are seen within a physiologically distended gallbladder. There is colonic interposition large bowel between the liver and ventral abdominal wall. Pancreas: There are multifocal abscesses within the phlegmonous inflammatory soft tissue mass outlining the pancreas which have developed since the interim. There is an approximately 5.8 x 3.5 x 5.6 cm abscess immediately anterior to the third portion of the duodenum interposed between the duodenum and SMA, series 2, image 46, series 5, image 52. There is an approximately 5.9 x 4.2 x 4.2 cm right upper quadrant abscess just anterior to the junction of the second and third portion of the duodenum, series 2 image 40 all, series 5, image 44. There is an approximately 6.1 x 3.9 x 3.3 cm abscess with air just anterior to the distal third portion of the duodenum and overlying the SMA, series 2 image 35 and series 5 image 44. Smaller foci of air seen within the phlegmonous mass further cephalad anterior to the pancreatic head  and midbody, series 2, image 30. Faint enhancement of the atretic pancreatic gland is seen within the phlegmonous mass. There appears to have been some necrosis of portions of the pancreatic body and head since initial study from 04/03/2016. The portal and splenic veins remain patent with slight narrowing or mass-effect on the mid to distal splenic vein, series 2, image 32 . Spleen: Normal in size without focal abnormality. Adrenals/Urinary Tract: Stable appearance of the adrenal glands and kidneys without obstructive uropathy. Bladder is partially distended. Stomach/Bowel: There is mild sympathetic thickening of small intestine in the region of the pancreas. No bowel obstruction is noted. Vascular/Lymphatic: Aortoiliac and branch vessel atherosclerosis. The celiac vessels, SMA and IMA appear patent. Reproductive: Prostate is mildly enlarged up to 5.3 cm with peripheral zone calcifications. Other: Small amount of fluid is seen in the pelvis  with trace edema along the pericolic gutters and adjacent to the liver. Musculoskeletal: Thoracolumbar degenerative disc disease consistent with spondylosis. No acute osseous abnormality. 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 Decrease in pancreatic gland enhancement consistent with probable areas of necrosis since the initial study. Patent splenic and portal veins and SMA. Splenic vein is slightly narrowed along its midportion from the phlegmonous mass. Critical Value/emergent results were called by telephone at the time of interpretation on 05/02/2016 at 3:22 pm to Dr. Raeford RazorSTEPHEN KOHUT , who verbally acknowledged these results. Electronically Signed   By: Tollie Ethavid  Kwon M.D.   On: 05/02/2016 15:22   Ct Aspiration  Result Date: 05/03/2016 INDICATION: Pancreatitis with peripancreatic abscess/pseudocyst. Please perform CT-guided aspiration for the acquisition of a sample for  laboratory analysis. EXAM: CT GUIDANCE NEEDLE PLACEMENT COMPARISON:  CT abdomen pelvis - 05/02/2016 ; 04/22/2016 MEDICATIONS: The patient is currently admitted to the hospital and receiving intravenous antibiotics. The antibiotics were administered within an appropriate time frame prior to the initiation of the procedure. ANESTHESIA/SEDATION: Moderate (conscious) sedation was employed during this procedure. A total of Versed 1 mg and Fentanyl 75 mcg was administered intravenously. Moderate Sedation Time: 15 minutes. The patient's level of consciousness and vital signs were monitored continuously by radiology nursing throughout the procedure under my direct supervision. CONTRAST:  None COMPLICATIONS: None immediate. PROCEDURE: Informed written consent was obtained from the patient after a discussion of the risks, benefits and alternatives to treatment. The patient was placed supine, slightly RPO on the CT gantry and a pre procedural CT was performed re-demonstrating the known ill-defined abscess/fluid collection about the pancreas. The procedure was planned. A timeout was performed prior to the initiation of the procedure. The skin overlying the left lateral abdomen was prepped and draped in the usual sterile fashion. The overlying soft tissues were anesthetized with 1% lidocaine with epinephrine. Appropriate trajectory was planned with the use of a 22 gauge spinal needle. An 18 gauge trocar needle was advanced into the abscess/fluid collection and a short Amplatz super stiff wire was coiled within the collection. Appropriate positioning was confirmed with a limited CT scan. The tract was dilated allowing placement of a temporary 8 [place] all-purpose drainage catheter. Approximately 75 cc of purulent fluid was aspirated as the 8 French drainage catheter was slowly retracted. A dressing was placed. The patient tolerated the procedure well without immediate post procedural complication. IMPRESSION: Successful CT guided  aspiration of approximately 75 cc of purulent fluid from the peripancreatic fluid collection/pseudocyst. Samples were sent to the laboratory as requested by the ordering clinical team. Electronically Signed   By: Simonne ComeJohn  Watts M.D.   On: 05/03/2016 11:50   Dg Chest Port 1 View  Result Date: 04/24/2016 CLINICAL DATA:  Central line placement EXAM: PORTABLE CHEST 1 VIEW COMPARISON:  04/24/2016 400 hours FINDINGS: Right upper extremity PICC placed. Tip is at the cavoatrial junction. Low volumes. Stable ovoid density towards the right apex. Normal heart size. No pneumothorax. IMPRESSION: Right upper extremity PICC placed. Tip is at the cavoatrial junction. Electronically Signed   By: Jolaine ClickArthur  Hoss M.D.   On: 04/24/2016 13:31   Dg Chest Port 1 View  Result Date: 04/24/2016 CLINICAL DATA:  Short of breath EXAM: PORTABLE CHEST 1 VIEW COMPARISON:  04/22/2016 FINDINGS: Normal heart size. Ovoid density at the right apex is stable. Lungs are very under aerated but otherwise grossly clear. Chronic right rib deformities. No pneumothorax. IMPRESSION: Stable examination.  Stable  ovoid density at the right apex. Electronically Signed   By: Jolaine Click M.D.   On: 04/24/2016 07:56   Dg Abd Portable 1v  Result Date: 05/06/2016 CLINICAL DATA:  Feeding tube placement EXAM: PORTABLE ABDOMEN - 1 VIEW COMPARISON:  05/03/2016 FINDINGS: Upper abdomen is non included. Nonobstructed gas pattern. Esophageal tube tip overlies the junction of second and third portion of duodenum. IMPRESSION: Esophageal tube tip overlies the junction of second and third portion of duodenum. Electronically Signed   By: Jasmine Pang M.D.   On: 05/06/2016 19:09   Dg Abd Portable 1v  Result Date: 05/03/2016 CLINICAL DATA:  Enteric tube placement. EXAM: PORTABLE ABDOMEN - 1 VIEW COMPARISON:  05/03/2016 CT FINDINGS: A small bore feeding tube is identified with tip overlying the descending duodenum. The bowel gas pattern is unremarkable. No acute bony  abnormalities are identified. IMPRESSION: Small bore feeding tube with tip overlying the descending duodenum. Electronically Signed   By: Harmon Pier M.D.   On: 05/03/2016 17:39    EKG & Cardiac Imaging    EKG: Afib with RVR. Prior EKG shows NSR    Echocardiogram: 04/25/16 Study Conclusions  - Left ventricle: The cavity size was normal. Systolic function was   moderately to severely reduced. The estimated ejection fraction   was in the range of 30% to 35%. Doppler parameters are consistent   with abnormal left ventricular relaxation (grade 1 diastolic   dysfunction). - Aortic valve: There was mild regurgitation. - Mitral valve: There was moderate regurgitation. - Left atrium: The atrium was mildly dilated.  Impressions:  - There is diffuse hypokinesis more pronounced in the inferior,   inferolateral and inferoseptal walls. Right ventricle is poorly   visualized, EF can&'t be estimated.   There was no evidence of a vegetation.   Assessment & Plan  1. Evaluation of cardiac risk for surgery:  PREOPERATIVE CARDIAC RISK ASSESSMENT   Revised Cardiac Risk Index:  High Risk Surgery: Yes  Defined as Intraperitoneal, intrathoracic or suprainguinal vascular  Active CAD:  No   CHF: Yes  Cerebrovascular Disease: Yes  Diabetes: yes; On Insulin: Yes - but not prior to this current illness  CKD (Cr >~ 2): No   Total: 3 (Insulin requirement is not chronic) Estimated Risk of Adverse Outcome: Class IV, high risk for high risk surgery.  Estimated Risk of MI, PE, VF/VT (Cardiac Arrest), Complete Heart Block: 11%, but with standing beta blocker treatment, risk is reduced by roughly 50%.    ACC/AHA Guidelines for "Clearance":  Step 1 - Need for Emergency Surgery: No, but urgent  If Yes - go straight to OR with perioperative surveillance  Step 2 - Active Cardiac Conditions (Unstable Angina, Decompensated HF, Significant  Arrhytmias - Complete HB, Mobitz II, Symptomatic VT or  SVT, Severe Aortic Stenosis - mean gradient > 40 mmHg, Valve area < 1.0 cm2):   Yes  If Yes - Evaluate & Treat per ACC/AHA Guidelines  Step 3 -  Low Risk Surgery: No   If Yes --> proceed to OR  If No --> Step 4  Step 4 - Functional Capacity >= 4 METS without symptoms: No   If Yes --> proceed to OR  If No --> Step 5  Step 5 --  Clinical Risk Factors (CRF)   3 or more: Yes  If Yes -- assess Surgical Risk, --   (High Risk Non-cardiac), Intraabdominal or thoracic vascular surgery consider testing if it will change management   It is a high  risk surgery however, doing a stress test would not change management in this critically ill patient. He is not a candidate for cardiac cath or intervention given his hemorrhagic pancreatitis. Continue beta blocker, keep an eye on volume status.    Signed, Little Ishikawa, NP 05/07/2016, 2:42 PM Pager: [phone]   I saw evaluated Mr. Earnshaw later on this evening after he was seen by Suzzette Righter, NP. Unfortunately is not available for evaluation earlier today. Thankfully when I returned he had several family members present including I believe a son and daughter who had multiple questions.  Mr. Eskew has had several episodes now with recurrent abdominal crises live but to significant pancreatitis and we are asked for preoperative risk assessment for possible cholecystectomy.  Mr. Lafosse has not necessarily improved much according to the family and continues to be getting more more fatigued and concerned about his overall well-being. He quite likely may require a somewhat urgent abdominal surgery which if is able to be done from a laparoscopic technique would likely not be considered high risk, however with good chance that a significant open surgery, it would be a high-risk surgery. He has not had any untoward symptoms to suggest prior to this hospitalization he was having any angina or heart failure symptoms. He did have an echocardiogram  showed a significantly reduced ejection fraction was we cannot exclude an ischemic component, however more likely this is related to chronic illness with stress  As for any additional testing, I would not pursue a stress test on him to evaluate this low EF prior to necessary surgery simply because would only serve to delay surgery and continues treatment recommendations. An abnormal stress test would then beg the question of cardiac catheterization which then begs question of what we do a final lesion. I would not want to be forcibly decision not situation as I do not think that it would be something that I would consider working out to be preoperative at this stage. He is not actively having any anginal symptoms which would suggest no active coronary disease. He was troubled with CHF with a reduced ejection fraction mostly because of atrial fibrillation.  Because of his comorbidities he would be a very high-risk patient for high risk surgery. Thankfully he is on a beta blocker which should be continued perioperatively. Morrie Sheldon would use beta blocker for this type of atrial fibrillation and if necessary amiodarone.  Avoid excess volume shifts and try to avoid Input > Output to avoid exacerbation of cardiomyopathy/CHF.  As I explained to the patient and family will be available if necessary perioperatively. I simply think that if he needs to go to the OR, that would be the recommendation without any further evaluation.    Bryan Lemma, M.D., M.S. Interventional Cardiologist   Pager # [phone] Phone # [phone] [address] [place], [place] [address]

## 2016-05-07 NOTE — Progress Notes (Signed)
PHARMACY - ADULT TOTAL PARENTERAL NUTRITION CONSULT NOTE   Pharmacy Consult for TPN Indication: Intolerance of enteral feeding  Patient Measurements: Height: 5\' 9"  (175.3 cm) Weight: 160 lb 11.5 oz (72.9 kg) IBW/kg (Calculated) : 70.7 TPN AdjBW (KG): 72.9 Body mass index is 23.73 kg/m.   Assessment:  82yo male admitted 10/8-10/12 with hemorrhagic pancreatitis 2nd GB, readmitted 11/7-11/13 with severe EColi sepsis, and readmitted 11/17 with weakness, N/V, and abdominal pain.  CT(+) intra-abdominal abscesses, WBC 26.  He has probable infected pancreatitic necrosis & will likely require surgery. Pt has lost 20lb in the last 2 months, severe malnutrition per RD assessment. Tube feeds were planned to start but patient with significant nausea and bilious emesis on 11/19 so pharmacy consulted to start TPN.   GI: Gallstone pancreatitis, surgery likely needed. Patient is severely malnourished. Now tolerating trickle feeds with no NG output. Surgery ok with increasing slowly. Possibly will go home on TF & come back for surgery. If patient tolerats slight increase of TFs throughout today may consider stopping TPN tomorrow. Albumin low at 1.7, prealbumin < 5. Creon, Protonix IV BID Endo:  CBGs uncontrolled (150-240s) on SSI. Had fingerstick yesterday that was 500? All other CBGs ok so probably error. Insulin requirements in the past 24 hours: 18 units of SSI Lytes: wnl. CoCa 9.8. Phos ok at 2.8.  Renal: SCr stable, CrCl ~7270ml/min. UOP 0.863ml/kg/hr.  NS 3035ml/hr.  Pulm:  RA Cards:  Carotid artery stenosis, HTN, HLD.  BP a bit up. Metoprolol Hepatobil:  LFTs wnl, Tbili ok at 0.6. TG wnl at 78. Neuro:  Depression, anxiety. ID:  On imipenem for E. Coli abscess and covering for possible nec pancreatitis. Afebrile, WBC down to 15.5.   Best Practices: mouth care TPN Access:  PICC 11/18 TPN start date:  11/20  Nutritional Goals (per RD recommendation on 11/21): KCal: 1900-2100 Protein: 100-125  g  Current Nutrition:  NPO Clinimix 4.25/25 at 7140ml/hr Vital AF 1.2 at 2010ml/hr (18 g of protein and 288 kcal)  Plan:  Continue Clinimix E 4.25/25 at 6840ml/hr Continue 20% lipid emulsion at 745ml/hr Continue NS at 5335ml/hr TPN and TF provides 58 g of protein and 1508 kCals per day meeting ~50% of protein and ~75% of kCal needs. Will advance TPN towards goal tomorrow if patient unable to tolerate increase in TFs. Add MVI and TE in TPN Add 15 units of regular insulin in TPN Continue moderate SSI and adjust as needed Monitor TPN labs, signs of refeeding symptoms F/U ability to tolerate TFs  Give 30mEq IV of KCl x 1 today  Enzo BiNathan Mable Lashley, PharmD, [facility] Clinical Pharmacist Pager [phone] 05/07/2016 8:23 AM

## 2016-05-07 NOTE — Progress Notes (Signed)
  Subjective: NAE tol trickle TFs w/o abd pain  Objective: Vital signs in last 24 hours: Temp:  [97.5 F (36.4 C)-98.8 F (37.1 C)] 97.6 F (36.4 C) (11/22 0403) Pulse Rate:  [78-91] 78 (11/22 0600) Resp:  [14-22] 18 (11/22 0600) BP: (117-138)/(67-79) 133/76 (11/22 0600) SpO2:  [93 %-98 %] 93 % (11/22 0600) Weight:  [71.2 kg (157 lb)] 71.2 kg (157 lb) (11/22 0403) Last BM Date: 05/05/16  Intake/Output from previous day: 11/21 0701 - 11/22 0700 In: 2547.5 [P.O.:50; I.V.:1599; NG/GT:93.5; IV Piggyback:805] Out: 700 [Urine:700] Intake/Output this shift: No intake/output data recorded.  General appearance: alert and appears stated age GI: soft, distended, nttp, active BS  Lab Results:   Recent Labs  05/05/16 0545 05/06/16 0500  WBC 18.2* 15.5*  HGB 11.0* 11.1*  HCT 33.2* 34.5*  PLT 223 246   BMET  Recent Labs  05/06/16 0500 05/07/16 0425  NA 137 137  K 3.2* 3.5  CL 105 106  CO2 24 25  GLUCOSE 201* 150*  BUN 23* 18  CREATININE 0.65 0.51*  CALCIUM 7.9* 8.0*   PT/INR No results for input(s): LABPROT, INR in the last 72 hours. ABG No results for input(s): PHART, HCO3 in the last 72 hours.  Invalid input(s): PCO2, PO2  Studies/Results: Dg Abd Portable 1v  Result Date: 05/06/2016 CLINICAL DATA:  Feeding tube placement EXAM: PORTABLE ABDOMEN - 1 VIEW COMPARISON:  05/03/2016 FINDINGS: Upper abdomen is non included. Nonobstructed gas pattern. Esophageal tube tip overlies the junction of second and third portion of duodenum. IMPRESSION: Esophageal tube tip overlies the junction of second and third portion of duodenum. Electronically Signed   By: Jasmine PangKim  Fujinaga M.D.   On: 05/06/2016 19:09    Anti-infectives: Anti-infectives    Start     Dose/Rate Route Frequency Ordered Stop   05/05/16 1230  fluconazole (DIFLUCAN) IVPB 100 mg     100 mg 50 mL/hr over 60 Minutes Intravenous Every 24 hours 05/05/16 1135     05/03/16 1200  imipenem-cilastatin (PRIMAXIN) 500 mg  in sodium chloride 0.9 % 100 mL IVPB     500 mg 200 mL/hr over 30 Minutes Intravenous Every 6 hours 05/03/16 1122     05/03/16 0030  piperacillin-tazobactam (ZOSYN) IVPB 3.375 g  Status:  Discontinued     3.375 g 12.5 mL/hr over 240 Minutes Intravenous Every 8 hours 05/02/16 1647 05/03/16 1101   05/02/16 1930  fluconazole (DIFLUCAN) tablet 100 mg  Status:  Discontinued     100 mg Oral Daily 05/02/16 1816 05/05/16 1135   05/02/16 1600  piperacillin-tazobactam (ZOSYN) IVPB 3.375 g     3.375 g 100 mL/hr over 30 Minutes Intravenous  Once 05/02/16 1548 05/02/16 1720      Assessment/Plan: Gallstone pancreatitis Pancreatic abscess Severe protein calorie malnutrition E coli pyelonephritis weakness Sacral ulcer CHF  -Con't TNA, Tol trickle TFs well, can increase TF slowly as tol -Will need gallbladder out likely in an interval fashion as pt severely malnutritioned.  -Will need improved nutrition, improvement/resolution of pancreatitis, and cards has recommended stress test.  -Cultures= E.coli.  Would rec con't primaxin Continue primaxin/fluconazole. Will follow.    LOS: 5 days    Colin EhlersRamirez Jr., [facility] 05/07/2016

## 2016-05-07 NOTE — Progress Notes (Signed)
Nutrition Follow-up  DOCUMENTATION CODES:   Severe malnutrition in context of acute illness/injury  INTERVENTION:   Increase Vital AF 1.2 by 10 ml every 12 hours to goal rate of 70 ml/hr (reviewed orders with RN) Provides: 2016 kcal, 126 grams protein, and 1362 ml H2O.   Pharmacy to d/c TPN if pt continues to tolerate TF advancement.    NUTRITION DIAGNOSIS:   Malnutrition (Severe) related to acute illness (Pancreatitis) as evidenced by severe depletion of muscle mass, severe depletion of body fat, energy intake < or equal to 50% for > or equal to 5 days. Ongoing.   GOAL:   Patient will meet greater than or equal to 90% of their needs Progressign.   MONITOR:   TF tolerance, Skin, I & O's, Labs  ASSESSMENT:   82 year old male with a history of HTN, DM diet-controlled, hyperlipidemia. Patient was recently admitted with hemorrhagic pancreatitis secondary to gallbladder from 10/8-10/12. Patient was readmitted on 11/7-11/13 due to severe sepsis secondary to E coli and [facility]. He's been continuing to become weak with nausea and vomiting and mild abdominal pain.  Cortrak tube in 2/3rd portion of duodenum (placed 11/18) with Vital AF 1.2 infusing @ 10 ml/hr (started 11/21) and tolerating. Per [facility] can increase TF as tolerated.   TPN (started 11/20): Clinimix E 4.25/25 @ 40 ml/hr with 20% lipids infusing @ 5 ml/hr. Provides: 1219 kcal and 40 grams protein  Remains on creon CBG's: 147-152  Diet Order:  Diet NPO time specified Except for: Sips with Meds TPN (CLINIMIX-E) Adult TPN (CLINIMIX-E) Adult  Skin:  Wound (see comment) (MASD to groin, scrotum, and sacrum)  Last BM:  11/20  Height:   Ht Readings from Last 1 Encounters:  05/02/16 5\' 9"  (1.753 m)    Weight:   Wt Readings from Last 1 Encounters:  05/07/16 157 lb (71.2 kg)    Ideal Body Weight:  72.7 kg  BMI:  Body mass index is 23.18 kg/m.  Estimated Nutritional Needs:    Kcal:  1900-2100  Protein:  100-125 grams  Fluid:  > 1.9 L/day  EDUCATION NEEDS:   No education needs identified at this time  Kendell BaneHeather Onyx Edgley RD, LDN, CNSC [phone] Pager [phone] After Hours Pager

## 2016-05-08 ENCOUNTER — Inpatient Hospital Stay (HOSPITAL_COMMUNITY): Payer: Medicare Other

## 2016-05-08 DIAGNOSIS — Z01810 Encounter for preprocedural cardiovascular examination: Secondary | ICD-10-CM

## 2016-05-08 LAB — ECHOCARDIOGRAM COMPLETE
EERAT: 4.44
EWDT: 299 ms
FS: 15 % — AB (ref 28–44)
Height: 69 in
IVS/LV PW RATIO, ED: 1.1
LA ID, A-P, ES: 46 mm
LA diam end sys: 46 mm
LA diam index: 2.4 cm/m2
LA vol A4C: 44.3 ml
LAVOL: 41.2 mL
LAVOLIN: 21.5 mL/m2
LV E/e'average: 4.44
LV TDI E'LATERAL: 10.5
LVEEMED: 4.44
LVELAT: 10.5 cm/s
LVOT area: 2.27 cm2
LVOT diameter: 17 mm
MV Dec: 299
MV pk A vel: 69.4 m/s
MVPKEVEL: 46.6 m/s
PW: 12.5 mm — AB (ref 0.6–1.1)
TDI e' medial: 7.43
Weight: 2688 oz

## 2016-05-08 LAB — GLUCOSE, CAPILLARY
GLUCOSE-CAPILLARY: 192 mg/dL — AB (ref 65–99)
GLUCOSE-CAPILLARY: 166 mg/dL — AB (ref 65–99)
Glucose-Capillary: 130 mg/dL — ABNORMAL HIGH (ref 65–99)
Glucose-Capillary: 142 mg/dL — ABNORMAL HIGH (ref 65–99)
Glucose-Capillary: 169 mg/dL — ABNORMAL HIGH (ref 65–99)
Glucose-Capillary: 176 mg/dL — ABNORMAL HIGH (ref 65–99)

## 2016-05-08 LAB — COMPREHENSIVE METABOLIC PANEL
ALK PHOS: 317 U/L — AB (ref 38–126)
ALT: 53 U/L (ref 17–63)
AST: 155 U/L — ABNORMAL HIGH (ref 15–41)
Albumin: 1.6 g/dL — ABNORMAL LOW (ref 3.5–5.0)
Anion gap: 3 — ABNORMAL LOW (ref 5–15)
BILIRUBIN TOTAL: 0.4 mg/dL (ref 0.3–1.2)
BUN: 21 mg/dL — ABNORMAL HIGH (ref 6–20)
CALCIUM: 8 mg/dL — AB (ref 8.9–10.3)
CO2: 27 mmol/L (ref 22–32)
CREATININE: 0.58 mg/dL — AB (ref 0.61–1.24)
Chloride: 107 mmol/L (ref 101–111)
Glucose, Bld: 189 mg/dL — ABNORMAL HIGH (ref 65–99)
Potassium: 3.9 mmol/L (ref 3.5–5.1)
Sodium: 137 mmol/L (ref 135–145)
TOTAL PROTEIN: 4.9 g/dL — AB (ref 6.5–8.1)

## 2016-05-08 LAB — CBC
HCT: 34.8 % — ABNORMAL LOW (ref 39.0–52.0)
Hemoglobin: 11.3 g/dL — ABNORMAL LOW (ref 13.0–17.0)
MCH: 31.2 pg (ref 26.0–34.0)
MCHC: 32.5 g/dL (ref 30.0–36.0)
MCV: 96.1 fL (ref 78.0–100.0)
Platelets: 190 10*3/uL (ref 150–400)
RBC: 3.62 MIL/uL — ABNORMAL LOW (ref 4.22–5.81)
RDW: 14.1 % (ref 11.5–15.5)
WBC: 16.3 10*3/uL — ABNORMAL HIGH (ref 4.0–10.5)

## 2016-05-08 LAB — AEROBIC/ANAEROBIC CULTURE (SURGICAL/DEEP WOUND): SPECIAL REQUESTS: NORMAL

## 2016-05-08 LAB — LACTIC ACID, PLASMA: Lactic Acid, Venous: 1.3 mmol/L (ref 0.5–1.9)

## 2016-05-08 LAB — MAGNESIUM: MAGNESIUM: 1.9 mg/dL (ref 1.7–2.4)

## 2016-05-08 LAB — PHOSPHORUS: PHOSPHORUS: 2.4 mg/dL — AB (ref 2.5–4.6)

## 2016-05-08 MED ORDER — MORPHINE SULFATE (PF) 2 MG/ML IV SOLN: 2.0000 mg | Freq: Once | INTRAVENOUS | Status: DC

## 2016-05-08 MED ORDER — FAT EMULSION 20 % IV EMUL
240.0000 mL | INTRAVENOUS | Status: AC
  Administered 2016-05-08: 240 mL via INTRAVENOUS
  Filled 2016-05-08: qty 250

## 2016-05-08 MED ORDER — FENTANYL CITRATE (PF) 100 MCG/2ML IJ SOLN
25.0000 ug | Freq: Once | INTRAMUSCULAR | Status: AC
  Administered 2016-05-08: 25 ug via INTRAVENOUS
  Filled 2016-05-08: qty 2

## 2016-05-08 MED ORDER — VITAL AF 1.2 CAL PO LIQD
1000.0000 mL | ORAL | Status: DC
  Filled 2016-05-08 (×3): qty 1000

## 2016-05-08 MED ORDER — TRACE MINERALS CR-CU-MN-SE-ZN 10-1000-500-60 MCG/ML IV SOLN
INTRAVENOUS | Status: AC
  Administered 2016-05-08: 18:00:00 via INTRAVENOUS
  Filled 2016-05-08: qty 960

## 2016-05-08 MED ORDER — GUAIFENESIN-DM 100-10 MG/5ML PO SYRP
5.0000 mL | ORAL_SOLUTION | ORAL | Status: DC | PRN
  Administered 2016-05-08 – 2016-05-18 (×6): 5 mL via ORAL
  Filled 2016-05-08 (×8): qty 5

## 2016-05-08 MED ORDER — MORPHINE SULFATE (PF) 2 MG/ML IV SOLN
2.0000 mg | INTRAVENOUS | Status: DC | PRN
  Administered 2016-05-08 – 2016-05-20 (×34): 2 mg via INTRAVENOUS
  Filled 2016-05-08 (×34): qty 1

## 2016-05-08 NOTE — Progress Notes (Signed)
Subjective: Tube feeds at 30 ml/ hr Increasing abdominal pain No nausea or vomiting overnight  Objective: Vital signs in last 24 hours: Temp:  [97.3 F (36.3 C)-98.6 F (37 C)] 98.6 F (37 C) (11/23 0412) Pulse Rate:  [96-101] 101 (11/22 2354) Resp:  [21-25] 23 (11/22 2354) BP: (104-129)/(69-79) 120/72 (11/23 0412) SpO2:  [96 %-99 %] 96 % (11/22 2354) Weight:  [76.2 kg (168 lb)] 76.2 kg (168 lb) (11/23 0412) Last BM Date: 05/05/16  Intake/Output from previous day: 11/22 0701 - 11/23 0700 In: 1985 [I.V.:1255; NG/GT:430; IV Piggyback:300] Out: 500 [Urine:500] Intake/Output this shift: No intake/output data recorded.  Gen:  WDWN in NAD Lungs - CTA B; normal respiratory effort CV - RRR; no murmurs;  Abd - + BS; soft; tender across upper abdomen; no palpable masses Alert and oriented  Lab Results:   Recent Labs  05/06/16 0500 05/08/16 0415  WBC 15.5* 16.3*  HGB 11.1* 11.3*  HCT 34.5* 34.8*  PLT 246 190   BMET  Recent Labs  05/07/16 0425 05/08/16 0415  NA 137 137  K 3.5 3.9  CL 106 107  CO2 25 27  GLUCOSE 150* 189*  BUN 18 21*  CREATININE 0.51* 0.58*  CALCIUM 8.0* 8.0*   Hepatic Function Latest Ref Rng & Units 05/08/2016 05/06/2016 05/05/2016  Total Protein 6.5 - 8.1 g/dL 4.9(L) 4.7(L) 4.9(L)  Albumin 3.5 - 5.0 g/dL 1.6(L) 1.7(L) 1.7(L)  AST 15 - 41 U/L 155(H) 30 24  ALT 17 - 63 U/L 53 18 17  Alk Phosphatase 38 - 126 U/L 317(H) 91 73  Total Bilirubin 0.3 - 1.2 mg/dL 0.4 0.6 1.4(H)    PT/INR No results for input(s): LABPROT, INR in the last 72 hours. ABG No results for input(s): PHART, HCO3 in the last 72 hours.  Invalid input(s): PCO2, PO2  Studies/Results: Dg Abd Portable 1v  Result Date: 05/06/2016 CLINICAL DATA:  Feeding tube placement EXAM: PORTABLE ABDOMEN - 1 VIEW COMPARISON:  05/03/2016 FINDINGS: Upper abdomen is non included. Nonobstructed gas pattern. Esophageal tube tip overlies the junction of second and third portion of duodenum.  IMPRESSION: Esophageal tube tip overlies the junction of second and third portion of duodenum. Electronically Signed   By: Donavan Foil M.D.   On: 05/06/2016 19:09    Anti-infectives: Anti-infectives    Start     Dose/Rate Route Frequency Ordered Stop   05/05/16 1230  fluconazole (DIFLUCAN) IVPB 100 mg     100 mg 50 mL/hr over 60 Minutes Intravenous Every 24 hours 05/05/16 1135     05/03/16 1200  imipenem-cilastatin (PRIMAXIN) 500 mg in sodium chloride 0.9 % 100 mL IVPB     500 mg 200 mL/hr over 30 Minutes Intravenous Every 6 hours 05/03/16 1122     05/03/16 0030  piperacillin-tazobactam (ZOSYN) IVPB 3.375 g  Status:  Discontinued     3.375 g 12.5 mL/hr over 240 Minutes Intravenous Every 8 hours 05/02/16 1647 05/03/16 1101   05/02/16 1930  fluconazole (DIFLUCAN) tablet 100 mg  Status:  Discontinued     100 mg Oral Daily 05/02/16 1816 05/05/16 1135   05/02/16 1600  piperacillin-tazobactam (ZOSYN) IVPB 3.375 g     3.375 g 100 mL/hr over 30 Minutes Intravenous  Once 05/02/16 1548 05/02/16 1720      Assessment/Plan: Gallstone pancreatitis Pancreatic abscess Severe protein calorie malnutrition E coli pyelonephritis weakness Sacral ulcer CHF  -Con't TNA, Tol trickle TFs well, but increasing pain as TF rate increased.  Would decreased to 20 ml/  hr - continue TNA -Will need gallbladder out likely in an interval fashion as pt severely malnutritioned.  -Will need improved nutrition, improvement/resolution of pancreatitis, and cards has recommended stress test.  -Cultures= E.coli.  Would rec con't primaxin Continue primaxin/fluconazole. Will follow.   LOS: 6 days    Colin Kashani K. 05/08/2016

## 2016-05-08 NOTE — Progress Notes (Signed)
    I briefly saw and evaluated the patient today along with Dr. Susie CassetteAbrol.  She is relatively stable from a cardiac standpoint. He does get tachycardic with pain.  He had been on both diltiazem and beta blocker. The diltiazem is now stopped. Would continue beta blocker especially preoperatively. As per my original consultation note, I would not recommend stress test evaluation at this time if he does need surgery. He would not be a candidate at this time for any interventional procedures therefore stress test would not be helpful.  Overwhelming data would suggest that he would do better having his surgery and then addressing his cardiac issues. I do agree with rechecking his echo however given his change in status would not be being in acute sepsis.   Cardiology service will continue to monitor and be available for assistance if needed. But with no active cardiac issues we will sign off for now.  Bryan Lemmaavid Harding, M.D., M.S. Interventional Cardiologist   Pager # [phone] Phone # [phone] [address] [place], [place] [id number]

## 2016-05-08 NOTE — Progress Notes (Signed)
PROGRESS NOTE  Colin West  ZOX:[id number] DOB: 07-Aug-1933 DOA: 05/02/2016 PCP: [name], MD Outpatient Specialists:  Subjective: Tolerating tube feeds, no A. fib overnight.    Brief Narrative:  EDKER PUNT is a 82 y.o. male  with complex history recently hospitalized from 10/8-10/12 for hemorrhagic pancreatitis from gallblader source, and on  11/7-11/13 for Severe Sepsis due to E.Coli pyelonephritis, discharged to  [facility] on oral antibiotics, brought to the emergency department with worsening generalized weakness, today  evere enough with  associated nausea and vomiting. He denies shortness of breath or productive cough. He has been unable to ambulate due to weakness. He denies any abdominal pain, he has occasional nausea without vomiting, he denies any diarrhea or constipation. He has not been able to eat a normal meal, lost about 15 lbs in the last 2 months.  He reports frequent hiccups. He denies any lower extremity swelling. Due to prolonged bed rest, he has developed some the ulcers in the sacrum and gluteal  areas as well as some minimal heel blanching hese is being cared at the facility as well. 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 -CT scan showed multiple peripancreatic abscesses, was recently in the hospital from 10/8-10/12 for hemorrhagic pancreatitis from gallbladder source. -Currently on Primaxin and Diflucan, likely he will need prolonged course of antibiotics at  least perioperatively -Patient developed pancreatic necrosis while he was eating, need to be strict NPO.  Continue DNA for surgery -Discussed with Dr. Derrell Lolling, patient's nutritional and functional status needs to improve prior to consider surgery. -Try to advance to feeding , patient refuses to go back to SNF in the interval that he is waiting for surgery -Need surgery to take the gallbladder out.  Cardiology reconsulted 11/22-if patient requires laparoscopic surgery-moderate risk, if patient requires open surgery, it would be considered high risk, at this point cannot rule out ischemia, no further cardiac testing recommended. Continue perioperative beta blocker. Proceed with surgery if indicated  will also repeat echo to see if EF has improved , as last echo was done in the setting of sepsis   Protein calorie malnutrition, severe  -Lost 20 pounds in the past 2 months, albumin is 2.3. Prealbumin is <5 -Per general surgery recommendation try postpyloric enteral nutrition prior to TPN. Currently on both TPN and enteric feeding -CORTRAK, in place  Atrial fibrillation, transient -Had run of atrial fibrillation with heart rate in the 130s, back to sinus rhythm with IV metoprolol. -Not sure if he needs anticoagulation for transient transient A. fib.  TSH  2.319  Type II Diabetes Current blood sugar level is 174 Currently nothing by mouth, per general surgery once into postpyloric feeding. Await their recommendations.   Hypertension BP 125/65   Pulse 86   Controlled Continue home anti-hypertensive medications    Hiccups This is likely secondary to the irritation of the diaphragm from pancreatic abscess, on Reglan.  Hyperlipidemia Continue home statins   Dilated Cardiomyopathy in view of recent sepsis 2 D echo recently shows EF 30-35 % 11/10,  Will be very careful with IV fluids.  ?Dementia -Patient was not able to remember what I told him yesterday. Did not remember why he  is in the hospital.    DVT prophylaxis:  Code Status: Full Code Family Communication:  Discussed with son and brother by the bedside Disposition Plan: refusing SNF, may need to stay until surgery  Diet: Diet NPO time specified Except for: Sips with Meds TPN (CLINIMIX-E) Adult  Consultants:   Gen. surgery.  Interventional radiology  Procedures:   Aspiration of abscess on 05/04/2016  Antimicrobials:   Zosyn and vancomycin changed to Primaxin on 05/03/2016  Objective: Vitals:   05/07/16 2000 05/07/16 2257 05/07/16 2354 05/08/16 0412  BP:  129/70 114/73 120/72  Pulse:   (!) 101   Resp: (!) 21  (!) 23   Temp: 97.3 F (36.3 C)  97.6 F (36.4 C) 98.6 F (37 C)  TempSrc: Oral  Oral Oral  SpO2:   96%   Weight:    76.2 kg (168 lb)  Height:        Intake/Output Summary (Last 24 hours) at 05/08/16 0840 Last data filed at 05/08/16 0700  Gross per 24 hour  Intake             1945 ml  Output              500 ml  Net             1445 ml   Filed Weights   05/02/16 1835 05/07/16 0403 05/08/16 0412  Weight: 72.9 kg (160 lb 11.5 oz) 71.2 kg (157 lb) 76.2 kg (168 lb)    Examination: General exam: Appears calm and comfortable  Respiratory system: Clear to auscultation. Respiratory effort normal. Cardiovascular system: S1 & S2 heard, RRR. No JVD, murmurs, rubs, gallops or clicks. No pedal edema. Gastrointestinal system: Abdomen is nondistended, soft and nontender. No organomegaly or masses felt. Normal bowel sounds heard. Central nervous system: Alert and oriented. No focal neurological deficits. Extremities: Symmetric 5 x 5 power. Skin: No rashes, lesions or ulcers Psychiatry: Judgement and insight appear normal. Mood & affect appropriate.   Data Reviewed: I have personally reviewed following labs and imaging studies  CBC:  Recent Labs Lab 05/02/16 1146 05/03/16 0242 05/04/16 0355 05/05/16 0545 05/06/16 0500 05/08/16 0415  WBC 26.0* 23.6* 21.0* 18.2* 15.5* 16.3*    NEUTROABS 22.3*  --   --   --  11.8*  --   HGB 12.9* 11.0* 11.1* 11.0* 11.1* 11.3*  HCT 38.6* 33.9* 34.4* 33.2* 34.5* 34.8*  MCV 96.3 96.0 96.1 95.4 96.1 96.1  PLT 231 216 233 223 246 190   Basic Metabolic Panel:  Recent Labs Lab 05/04/16 0355 05/05/16 0545 05/06/16 0500 05/07/16 0425 05/08/16 0415  NA 135 137 137 137 137  K 3.8 3.6 3.2* 3.5 3.9  CL 105 106 105 106 107  CO2 22 22 24 25 27   GLUCOSE 104* 112* 201* 150* 189*  BUN 18 24* 23* 18 21*  CREATININE 0.77 0.86 0.65 0.51* 0.58*  CALCIUM 8.1* 8.1* 7.9* 8.0* 8.0*  MG  --   --  1.8  --  1.9  PHOS  --   --  2.1* 2.8 2.4*   GFR: Estimated Creatinine Clearance: 73.6 mL/min (by C-G formula based on SCr of 0.58 mg/dL (L)). Liver Function Tests:  Recent Labs Lab 05/03/16 0242 05/04/16 0355 05/05/16 0545 05/06/16 0500 05/08/16 0415  AST 23 20 24 30  155*  ALT 20 19 17  18 53  ALKPHOS 76 76 73 91 317*  BILITOT 1.3* 1.4* 1.4* 0.6 0.4  PROT 5.1* 5.0* 4.9* 4.7* 4.9*  ALBUMIN 1.9* 1.7* 1.7* 1.7* 1.6*    Recent Labs Lab 05/02/16 1729  LIPASE 18   No results for input(s): AMMONIA in the last 168 hours. Coagulation Profile:  Recent Labs Lab 05/03/16 0242  INR 1.43   Cardiac Enzymes:  Recent Labs Lab 05/02/16 1729 05/02/16 2305 05/03/16 0242  TROPONINI 0.03* 0.04* 0.03*   BNP (last 3 results) No results for input(s): PROBNP in the last 8760 hours. HbA1C: No results for input(s): HGBA1C in the last 72 hours. CBG:  Recent Labs Lab 05/07/16 1700 05/07/16 2046 05/07/16 2347 05/08/16 0416 05/08/16 0823  GLUCAP 170* 188* 185* 176* 192*   Lipid Profile:  Recent Labs  05/06/16 0500  TRIG 78   Thyroid Function Tests: No results for input(s): TSH, T4TOTAL, FREET4, T3FREE, THYROIDAB in the last 72 hours. Anemia Panel: No results for input(s): VITAMINB12, FOLATE, FERRITIN, TIBC, IRON, RETICCTPCT in the last 72 hours. Urine analysis:    Component Value Date/Time   COLORURINE AMBER (A) 05/02/2016  1149   APPEARANCEUR CLOUDY (A) 05/02/2016 1149   LABSPEC 1.024 05/02/2016 1149   PHURINE 6.0 05/02/2016 1149   GLUCOSEU NEGATIVE 05/02/2016 1149   HGBUR NEGATIVE 05/02/2016 1149   BILIRUBINUR SMALL (A) 05/02/2016 1149   KETONESUR NEGATIVE 05/02/2016 1149   PROTEINUR NEGATIVE 05/02/2016 1149   UROBILINOGEN 0.2 03/08/2010 1026   NITRITE NEGATIVE 05/02/2016 1149   LEUKOCYTESUR SMALL (A) 05/02/2016 1149   Sepsis Labs: @LABRCNTIP (procalcitonin:4,lacticidven:4)  ) Recent Results (from the past 240 hour(s))  Culture, blood (Routine x 2)     Status: None   Collection Time: 05/02/16 11:45 AM  Result Value Ref Range Status   Specimen Description BLOOD LEFT ANTECUBITAL  Final   Special Requests BOTTLES DRAWN AEROBIC AND ANAEROBIC 5CC  Final   Culture NO GROWTH 5 DAYS  Final   Report Status 05/07/2016 FINAL  Final  Urine culture     Status: None   Collection Time: 05/02/16 11:49 AM  Result Value Ref Range Status   Specimen Description URINE, CLEAN CATCH  Final   Special Requests NONE  Final   Culture NO GROWTH  Final   Report Status 05/03/2016 FINAL  Final  Culture, blood (Routine x 2)     Status: None   Collection Time: 05/02/16 12:40 PM  Result Value Ref Range Status   Specimen Description BLOOD RIGHT ANTECUBITAL  Final   Special Requests BOTTLES DRAWN AEROBIC AND ANAEROBIC 10ML  Final   Culture NO GROWTH 5 DAYS  Final   Report Status 05/07/2016 FINAL  Final  MRSA PCR Screening     Status: None   Collection Time: 05/02/16  6:32 PM  Result Value Ref Range Status   MRSA by PCR NEGATIVE NEGATIVE Final    Comment:        The GeneXpert MRSA Assay (FDA approved for NASAL specimens only), is one component of a comprehensive MRSA colonization surveillance program. It is not intended to diagnose MRSA infection nor to guide or monitor treatment for MRSA infections.   Aerobic/Anaerobic Culture (surgical/deep wound)     Status: None (Preliminary result)   Collection Time: 05/03/16   2:10 PM  Result Value Ref Range Status   Specimen Description DRAINAGE  Final   Special Requests Normal  Final   Gram Stain   Final    ABUNDANT WBC PRESENT, PREDOMINANTLY PMN MODERATE GRAM NEGATIVE  RODS    Culture   Final    MODERATE ESCHERICHIA COLI NO ANAEROBES ISOLATED; CULTURE IN PROGRESS FOR 5 DAYS    Report Status PENDING  Incomplete   Organism ID, Bacteria ESCHERICHIA COLI  Final      Susceptibility   Escherichia coli - MIC*    AMPICILLIN >=32 RESISTANT Resistant     CEFAZOLIN <=4 SENSITIVE Sensitive     CEFEPIME <=1 SENSITIVE Sensitive     CEFTAZIDIME <=1 SENSITIVE Sensitive     CEFTRIAXONE <=1 SENSITIVE Sensitive     CIPROFLOXACIN >=4 RESISTANT Resistant     GENTAMICIN <=1 SENSITIVE Sensitive     IMIPENEM <=0.25 SENSITIVE Sensitive     TRIMETH/SULFA >=320 RESISTANT Resistant     AMPICILLIN/SULBACTAM >=32 RESISTANT Resistant     PIP/TAZO <=4 SENSITIVE Sensitive     Extended ESBL NEGATIVE Sensitive     * MODERATE ESCHERICHIA COLI     Invalid input(s): PROCALCITONIN, LACTICACIDVEN   Radiology Studies: Dg Abd Portable 1v  Result Date: 05/06/2016 CLINICAL DATA:  Feeding tube placement EXAM: PORTABLE ABDOMEN - 1 VIEW COMPARISON:  05/03/2016 FINDINGS: Upper abdomen is non included. Nonobstructed gas pattern. Esophageal tube tip overlies the junction of second and third portion of duodenum. IMPRESSION: Esophageal tube tip overlies the junction of second and third portion of duodenum. Electronically Signed   By: Jasmine Pang M.D.   On: 05/06/2016 19:09        Scheduled Meds: . atorvastatin  10 mg Oral q1800  . chlorhexidine  15 mL Mouth Rinse BID  . fluconazole (DIFLUCAN) IV  100 mg Intravenous Q24H  . imipenem-cilastatin  500 mg Intravenous Q6H  . insulin aspart  0-15 Units Subcutaneous Q4H  . lipase/protease/amylase  36,000 Units Oral TID AC  . mouth rinse  15 mL Mouth Rinse q12n4p  . metoprolol  100 mg Oral BID  . pantoprazole (PROTONIX) IV  40 mg Intravenous  Q12H  . sodium chloride flush  10-40 mL Intracatheter Q12H  . sodium chloride flush  3 mL Intravenous Q12H   Continuous Infusions: . sodium chloride 35 mL/hr at 05/05/16 1727  . diltiazem (CARDIZEM) infusion    . Marland KitchenTPN (CLINIMIX-E) Adult 40 mL/hr at 05/07/16 1800   And  . fat emulsion 120 mL (05/07/16 1757)  . feeding supplement (VITAL AF 1.2 CAL) 1,000 mL (05/08/16 0820)     LOS: 6 days    Time spent: 35 minutes    Richarda Overlie, MD [facility] Pager [phone]  If 7PM-7AM, please contact night-coverage [email or web address] Password [email or web address] 05/08/2016, 8:40 AM

## 2016-05-08 NOTE — Progress Notes (Signed)
  Echocardiogram 2D Echocardiogram has been performed.  Cathie BeamsGREGORY, Dominyk Law 05/08/2016, 3:11 PM

## 2016-05-08 NOTE — Progress Notes (Signed)
PHARMACY - ADULT TOTAL PARENTERAL NUTRITION CONSULT NOTE   Pharmacy Consult for TPN Indication: Intolerance of enteral feeding  Patient Measurements: Height: '5\' 9"'$  (175.3 cm) Weight: 160 lb 11.5 oz (72.9 kg) IBW/kg (Calculated) : 70.7 TPN AdjBW (KG): 72.9 Body mass index is 23.73 kg/m.   Assessment:  82yo male admitted 10/8-10/12 with hemorrhagic pancreatitis 2nd GB, readmitted 11/7-11/13 with severe EColi sepsis, and readmitted 11/17 with weakness, N/V, and abdominal pain.  CT(+) intra-abdominal abscesses, WBC 26.  He has probable infected pancreatitic necrosis & will likely require surgery. Pt has lost 20lb in the last 2 months, severe malnutrition per RD assessment. Tube feeds were planned to start but patient with significant nausea and bilious emesis on 11/19 so pharmacy consulted to start TPN.   GI: Gallstone pancreatitis, surgery likely needed. Patient is severely malnourished. Possibly will go home on TF & come back for surgery. Had increased pain over last 24 hrs with increase in TFs so rate decreased slightly. No nausea or vomiting. Albumin low at 1.6, prealbumin < 5. Creon, Protonix IV BID Endo:  CBGs better controlled yesterday (150-190s) on SSI and 15 units in TPN. Had fingerstick yesterday that was 500? All other CBGs ok so probably error. Insulin requirements in the past 24 hours: 15 units in TPN bag and 18 units of SSI Lytes: wnl. CoCa 9.9. Phos slightly low at 2.4, Mg ok at 1.9 Renal: SCr stable, CrCl ~11m/min. UOP not all charted.  NS 39mhr.  Pulm:  RA Cards:  Carotid artery stenosis, HTN, HLD.  BP a bit up. Metoprolol Hepatobil:  LFTs wnl exc AST and Alk Phos now elevated, Tbili ok at 0.4. TG wnl at 78. Neuro:  Depression, anxiety. ID:  On imipenem for E. Coli abscess and covering for possible nec pancreatitis. Afebrile, WBC elevated at 16.3  Best Practices: mouth care TPN Access:  PICC 11/18 TPN start date:  11/20  Nutritional Goals (per RD recommendation  on 11/21): KCal: 1900-2100 Protein: 100-125 g  Current Nutrition:  NPO Clinimix 4.25/25 at 4039mr Vital AF 1.2 at 94m57m (Goal rate is 70ml13mfor 126 g protein and 2016 kcal)  Plan:  Change Clinimix to E 5/20 at 40ml/19montinue 20% lipid emulsion at 10ml/h32mcreasing Vital AF to 20ml/hr23m Surgery (36g of protein and 576 kcal) Continue NS at 35ml/hr 58mand TF provides 84 g of protein and 1900 kCals per day meeting ~80% of protein and 100% of kCal needs. Add MVI and TE in TPN Increase to 25 units of regular insulin in TPN Continue moderate SSI and adjust as needed Monitor TPN labs, Phos and Bmet tomorrow, signs of refeeding symptoms F/U ability to tolerate TFs   Alexandrea Westergard BaElenor Quinones BCPS Clinical Pharmacist Pager [phone] 7:46 AM

## 2016-05-08 NOTE — Progress Notes (Signed)
Patient had two episodes of SVT reaching 150's and quickly returning to NSR 95-99. Patient remained asymptomatic.

## 2016-05-09 ENCOUNTER — Inpatient Hospital Stay (HOSPITAL_COMMUNITY): Payer: Medicare Other

## 2016-05-09 ENCOUNTER — Encounter (HOSPITAL_COMMUNITY): Payer: Self-pay | Admitting: Radiology

## 2016-05-09 LAB — GLUCOSE, CAPILLARY
GLUCOSE-CAPILLARY: 113 mg/dL — AB (ref 65–99)
GLUCOSE-CAPILLARY: 206 mg/dL — AB (ref 65–99)
Glucose-Capillary: 145 mg/dL — ABNORMAL HIGH (ref 65–99)
Glucose-Capillary: 132 mg/dL — ABNORMAL HIGH (ref 65–99)
Glucose-Capillary: 125 mg/dL — ABNORMAL HIGH (ref 65–99)

## 2016-05-09 LAB — BASIC METABOLIC PANEL
ANION GAP: 7 (ref 5–15)
BUN: 22 mg/dL — AB (ref 6–20)
CALCIUM: 8.1 mg/dL — AB (ref 8.9–10.3)
CO2: 26 mmol/L (ref 22–32)
Chloride: 105 mmol/L (ref 101–111)
Creatinine, Ser: 0.64 mg/dL (ref 0.61–1.24)
GFR calc Af Amer: >60 mL/min (ref >60)
GFR calc non Af Amer: >60 mL/min (ref >60)
GLUCOSE: 117 mg/dL — AB (ref 65–99)
POTASSIUM: 4 mmol/L (ref 3.5–5.1)
Sodium: 138 mmol/L (ref 135–145)

## 2016-05-09 LAB — PHOSPHORUS: Phosphorus: 2.6 mg/dL (ref 2.5–4.6)

## 2016-05-09 MED ORDER — TRACE MINERALS CR-CU-MN-SE-ZN 10-1000-500-60 MCG/ML IV SOLN
INTRAVENOUS | Status: AC
  Administered 2016-05-09: 17:00:00 via INTRAVENOUS
  Filled 2016-05-09: qty 1560

## 2016-05-09 MED ORDER — GUAIFENESIN-DM 100-10 MG/5ML PO SYRP
5.0000 mL | ORAL_SOLUTION | Freq: Three times a day (TID) | ORAL | Status: DC
  Administered 2016-05-09 – 2016-05-18 (×28): 5 mL via ORAL
  Filled 2016-05-09 (×27): qty 5

## 2016-05-09 MED ORDER — FAT EMULSION 20 % IV EMUL
240.0000 mL | INTRAVENOUS | Status: AC
  Administered 2016-05-09: 240 mL via INTRAVENOUS
  Filled 2016-05-09: qty 250

## 2016-05-09 MED ORDER — DEXTROSE 5 % IV SOLN
1.0000 g | INTRAVENOUS | Status: DC
  Administered 2016-05-09: 1 g via INTRAVENOUS
  Filled 2016-05-09 (×2): qty 10

## 2016-05-09 MED ORDER — VANCOMYCIN HCL IN DEXTROSE 750-5 MG/150ML-% IV SOLN
750.0000 mg | Freq: Two times a day (BID) | INTRAVENOUS | Status: DC
  Administered 2016-05-09 – 2016-05-11 (×5): 750 mg via INTRAVENOUS
  Filled 2016-05-09 (×5): qty 150

## 2016-05-09 MED ORDER — IOPAMIDOL (ISOVUE-300) INJECTION 61%
INTRAVENOUS | Status: AC
  Administered 2016-05-09: 100 mL
  Filled 2016-05-09: qty 100

## 2016-05-09 MED ORDER — DEXTROSE 5 % IV SOLN
2.0000 g | INTRAVENOUS | Status: DC
  Filled 2016-05-09: qty 2

## 2016-05-09 NOTE — Progress Notes (Signed)
Subjective: Patient awake and alert Denies abdominal pain currently Tube Feeds held because of concern for ileus/ aspiration  Objective: Vital signs in last 24 hours: Temp:  [97.7 F (36.5 C)-98.2 F (36.8 C)] 98 F (36.7 C) (11/24 0400) Pulse Rate:  [80-124] 83 (11/24 0400) Resp:  [17-24] 17 (11/24 0400) BP: (121-153)/(65-75) 153/69 (11/24 0400) SpO2:  [95 %-98 %] 95 % (11/24 0400) Weight:  [78.9 kg (174 lb)] 78.9 kg (174 lb) (11/24 0254) Last BM Date: 05/07/16  Intake/Output from previous day: 11/23 0701 - 11/24 0700 In: 1795 [I.V.:1595; IV Piggyback:200] Out: 675 [Urine:550; Stool:125] Intake/Output this shift: Total I/O In: -  Out: 200 [Urine:200]  General appearance: alert, cooperative and no distress Resp: clear to auscultation bilaterally Cardio: regular rate and rhythm, S1, S2 normal, no murmur, click, rub or gallop GI: Hypoactive bowel sounds; soft, non-tender; non-distended  Lab Results:   Recent Labs  05/08/16 0415  WBC 16.3*  HGB 11.3*  HCT 34.8*  PLT 190   BMET  Recent Labs  05/08/16 0415 05/09/16 0250  NA 137 138  K 3.9 4.0  CL 107 105  CO2 27 26  GLUCOSE 189* 117*  BUN 21* 22*  CREATININE 0.58* 0.64  CALCIUM 8.0* 8.1*   PT/INR No results for input(s): LABPROT, INR in the last 72 hours. ABG No results for input(s): PHART, HCO3 in the last 72 hours.  Invalid input(s): PCO2, PO2  Studies/Results: Dg Chest 1 View  Result Date: 05/08/2016 CLINICAL DATA:  Coughing episode EXAM: CHEST 1 VIEW COMPARISON:  05/03/2016 FINDINGS: Semi-erect portable view chest. A right upper extremity catheter tip overlies the cavoatrial junction. Esophageal tube tip is not included but probably positioned over duodenum. Markedly low lung volumes. Probable tiny effusions. Mild bibasilar atelectasis. No focal consolidations. Stable cardiomediastinal silhouette. No pneumothorax. Postsurgical changes in the right lung apex. Old right-sided rib deformities.  IMPRESSION: 1. Markedly low lung volumes with suspected tiny effusions. 2. Mild bibasilar atelectasis. Electronically Signed   By: Jasmine PangKim  Fujinaga M.D.   On: 05/08/2016 16:33   Dg Abd 1 View  Result Date: 05/08/2016 CLINICAL DATA:  NG tube placement EXAM: ABDOMEN - 1 VIEW COMPARISON:  05/06/2016 FINDINGS: Motion artifact limits the examination. Esophageal tube tip overlies the second portion of the duodenum. Some gaseous dilatation of bowel could relate to a mild ileus. IMPRESSION: Esophageal tube tip overlies the second portion of the duodenum. Mild gaseous dilatation of bowel could relate to a mild ileus. Electronically Signed   By: Jasmine PangKim  Fujinaga M.D.   On: 05/08/2016 16:35    Anti-infectives: Anti-infectives    Start     Dose/Rate Route Frequency Ordered Stop   05/09/16 1200  cefTRIAXone (ROCEPHIN) 2 g in dextrose 5 % 50 mL IVPB     2 g 100 mL/hr over 30 Minutes Intravenous Every 24 hours 05/09/16 0801     05/05/16 1230  fluconazole (DIFLUCAN) IVPB 100 mg     100 mg 50 mL/hr over 60 Minutes Intravenous Every 24 hours 05/05/16 1135     05/03/16 1200  imipenem-cilastatin (PRIMAXIN) 500 mg in sodium chloride 0.9 % 100 mL IVPB  Status:  Discontinued     500 mg 200 mL/hr over 30 Minutes Intravenous Every 6 hours 05/03/16 1122 05/09/16 0801   05/03/16 0030  piperacillin-tazobactam (ZOSYN) IVPB 3.375 g  Status:  Discontinued     3.375 g 12.5 mL/hr over 240 Minutes Intravenous Every 8 hours 05/02/16 1647 05/03/16 1101   05/02/16 1930  fluconazole (DIFLUCAN) tablet  100 mg  Status:  Discontinued     100 mg Oral Daily 05/02/16 1816 05/05/16 1135   05/02/16 1600  piperacillin-tazobactam (ZOSYN) IVPB 3.375 g     3.375 g 100 mL/hr over 30 Minutes Intravenous  Once 05/02/16 1548 05/02/16 1720      Assessment/Plan: Gallstone pancreatitis Pancreatic abscess Severe protein calorie malnutrition E coli pyelonephritis weakness Sacral ulcer CHF  Spent a considerable amount of time (30 min)  talking to the patient and his family to re-explain the plan.  Rushing to remove the gallbladder will do nothing to improve his pancreatitis, but needs to be done eventually to prevent future episodes of biliary pancreatitis.  The pancreas requires bowel rest, antibiotics to resolve the pancreatitis.  We will obtain another CT scan to evaluate the pancreatic abscess.  The patient is high risk for open surgery, which is much more likely if we try to remove his gallbladder before the pancreatitis resolves.  -Con't TNA, Tol trickle TFs well, but are currently held by primary team.  Continue TNA -Will need gallbladder out likely in an interval fashion as pt severely malnutritioned.  -Will need improved nutrition, improvement/resolution of pancreatitis, and cards has recommended stress test.  -Cultures= E.coli. Would rec con't primaxin Continue primaxin/fluconazole. Will follow.   LOS: 7 days    Richell Corker K. 05/09/2016

## 2016-05-09 NOTE — Progress Notes (Signed)
PHARMACY - ADULT TOTAL PARENTERAL NUTRITION CONSULT NOTE   Pharmacy Consult for TPN Indication: Intolerance of enteral feeding  Patient Measurements: Height: _0  (175.3 cm) Weight: 160 lb 11.5 oz (72.9 kg) IBW/kg (Calculated) : 70.7 TPN AdjBW (KG): 72.9 Body mass index is 23.73 kg/m.   Assessment:  82yo male admitted 10/8-10/12 with hemorrhagic pancreatitis 2nd GB, readmitted 11/7-11/13 with severe EColi sepsis, and readmitted 11/17 with weakness, N/V, and abdominal pain.  CT(+) intra-abdominal abscesses, WBC 26.  He has probable infected pancreatitic necrosis & will likely require surgery. Pt has lost 20lb in the last 2 months, severe malnutrition per RD assessment. Tube feeds were planned to start but patient with significant nausea and bilious emesis on 11/19 so pharmacy consulted to start TPN.   GI: Gallstone pancreatitis, surgery likely needed. Patient is severely malnourished. Possibly will go home on TF & come back for surgery. Had increased pain over last 24 hrs and now TFs on hold due to concern for ileus. Had been tolerating trickle feeds well. No nausea or vomiting. Albumin low at 1.6, prealbumin < 5. Creon, Protonix IV BID Endo:  CBGs better controlled yesterday (110-170s) on SSI and 25 units in TPN. Had fingerstick yesterday that was 500? All other CBGs ok so probably error. Insulin requirements in the past 24 hours: 25 units in TPN bag and 13 units of SSI Lytes: wnl. CoCa 10. Phos ok at 2.6, Mg ok at 1.9 Renal: SCr stable, CrCl ~18m/min. UOP not all charted.  NS 322mhr.  Pulm:  RA Cards:  Carotid artery stenosis, HTN, HLD.  BP a bit up. Metoprolol Hepatobil:  LFTs wnl exc AST and Alk Phos now elevated, Tbili ok at 0.4. TG wnl at 78. Neuro:  Depression, anxiety. ID:  On imipenem for E. Coli abscess and covering for possible nec pancreatitis. Afebrile, WBC elevated at 16.3  Best Practices: mouth care TPN Access:  PICC 11/18 TPN start date:  11/20  Nutritional  Goals (per RD recommendation on 11/21): KCal: 1900-2100 Protein: 100-125 g  Current Nutrition:  NPO Clinimix 5/20 Vital AF 1.2 currently on hold  Plan:  Increase Clinimix to E 5/15 at 6563mr Continue 20% lipid emulsion at 27m14m Decrease NS to 27ml38mTPN and TF provides 78 g of protein and 1588 kCals per day meeting ~70% of protein and 80% of kCal needs. Add MVI and TE in TPN Increase to 30 units of regular insulin in TPN Continue moderate SSI and adjust as needed Monitor TPN labs, Cmet, Mg, and Phos tomorrow, signs of refeeding symptoms F/U ability to tolerate TFs   NathaElenor QuinonesrmD, BCPS Clinical Pharmacist Pager [phone] 9:00 AM

## 2016-05-09 NOTE — Progress Notes (Signed)
PROGRESS NOTE  Colin West  WUJ:[id number] DOB: October 12, 1933 DOA: 05/02/2016 PCP: Verl BangsADIONTCHENKO, ALEXEI, MD Outpatient Specialists:  Subjective: THICK secretions , cough noted for 2 days Tube feeds held for possible aspiration    Brief Narrative:  Colin West is a 82 y.o. male  with complex history recently hospitalized from 10/8-10/12 for hemorrhagic pancreatitis from gallblader source, and on  11/7-11/13 for Severe Sepsis due to E.Coli pyelonephritis, discharged to  [facility] on oral antibiotics, brought to the emergency department with worsening generalized weakness, today  evere enough with  associated nausea and vomiting. He denies shortness of breath or productive cough. He has been unable to ambulate due to weakness. He denies any abdominal pain, he has occasional nausea without vomiting, he denies any diarrhea or constipation. He has not been able to eat a normal meal, lost about 15 lbs in the last 2 months.  He reports frequent hiccups. He denies any lower extremity swelling. Due to prolonged bed rest, he has developed some the ulcers in the sacrum and gluteal  areas as well as some minimal heel blanching hese is being cared at the facility as well. 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 -CT scan showed multiple peripancreatic abscesses, was recently in the hospital from 10/8-10/12 for hemorrhagic pancreatitis from gallbladder source. -Currently on Primaxin and Diflucan, likely he  will need prolonged course of antibiotics at least perioperatively. Abscess now growing Escherichia coli sensitive to Rocephin and antibiotics switched to Rocephin -Patient developed pancreatic necrosis while he was eating, need to be strict NPO. Continue TPN Unable to tolerate tube feeds -Discussed with Dr. Derrell Lollingamirez, patient's nutritional and functional status needs to improve prior to consider surgery which seems unlikely. -patient refuses to go back to SNF in the interval that he is waiting for surgery -Need surgery to take the gallbladder out as per general surgery.  Cardiology reconsulted 11/22-if patient requires laparoscopic surgery-moderate risk, if patient requires open surgery, it would be considered high risk, at this point cannot rule out ischemia, no further cardiac testing recommended. Continue perioperative beta blocker. Proceed with surgery if indicated Repeat 2-D echo shows mild improvement in EF to 35-40%. Continue   beta blocker CT abdomen pelvis ordered by general surgery for evaluation of pancreatitis/ileus   Cough /probable HCAP Suspect developing  pna  Add vancomycin to rocephin   Protein calorie malnutrition, severe  -Lost 20 pounds in the past 2 months, albumin continues to decline -Per general surgery recommendation try postpyloric enteral nutrition prior to TPN. Currently on both TPN and enteric feeding held because of ileus -CORTRAK, in place  Atrial fibrillation, transient -Had run of atrial fibrillation with heart rate in the 130s, back to sinus rhythm with IV metoprolol. Continue oral metoprolol -Not sure if he needs anticoagulation for transient transient A. fib.  TSH  2.319  Type II Diabetes Current blood sugar level is 174 Currently nothing by mouth, per general surgery once into postpyloric feeding.     Hypertension BP 125/65   Pulse 86   Controlled Continue  home anti-hypertensive medications    Hiccups This is likely secondary to the  irritation of the diaphragm from pancreatic abscess, on Reglan.  Hyperlipidemia Continue home statins   Dilated Cardiomyopathy in view of recent sepsis 2 D echo recently shows EF 30-35 % 11/10,    Will be very careful with IV fluids.  ?Dementia/encephalopathic  Check ammonia level. Minimize narcotics     DVT prophylaxis:  Code Status: Full Code Family Communication:  Discussed with son and brother by the bedside Disposition Plan: refusing SNF, may need to stay until surgery  Diet: Diet NPO time specified Except for: Sips with Meds TPN (CLINIMIX-E) Adult  Consultants:   Gen. surgery.  Interventional radiology  Cardiology  Procedures:   Aspiration of abscess on 05/04/2016  Antimicrobials:   Zosyn and vancomycin changed to Primaxin on 05/03/2016  Objective: Vitals:   05/09/16 0000 05/09/16 0254 05/09/16 0400 05/09/16 0901  BP: 130/71  (!) 153/69 139/74  Pulse: 87  83 89  Resp: (!) 24  17   Temp: 97.7 F (36.5 C)  98 F (36.7 C)   TempSrc: Oral  Oral   SpO2: 96%  95%   Weight:  78.9 kg (174 lb)    Height:        Intake/Output Summary (Last 24 hours) at 05/09/16 0904 Last data filed at 05/09/16 0705  Gross per 24 hour  Intake             1635 ml  Output              875 ml  Net              760 ml   Filed Weights   05/07/16 0403 05/08/16 0412 05/09/16 0254  Weight: 71.2 kg (157 lb) 76.2 kg (168 lb) 78.9 kg (174 lb)    Examination: General exam: Appears calm and comfortable  Respiratory system: Clear to auscultation. Respiratory effort normal. Cardiovascular system: S1 & S2 heard, RRR. No JVD, murmurs, rubs, gallops or clicks. No pedal edema. Gastrointestinal system: Abdomen is nondistended, soft and nontender. No organomegaly or masses felt. Normal bowel sounds heard. Central nervous system: Alert and oriented. No focal neurological deficits. Extremities: Symmetric 5 x 5 power. Skin: No rashes, lesions or ulcers Psychiatry: Judgement and  insight appear normal. Mood & affect appropriate.   Data Reviewed: I have personally reviewed following labs and imaging studies  CBC:  Recent Labs Lab 05/02/16 1146 05/03/16 0242 05/04/16 0355 05/05/16 0545 05/06/16 0500 05/08/16 0415  WBC 26.0* 23.6* 21.0* 18.2* 15.5* 16.3*  NEUTROABS 22.3*  --   --   --  11.8*  --   HGB 12.9* 11.0* 11.1* 11.0* 11.1* 11.3*  HCT 38.6* 33.9* 34.4* 33.2* 34.5* 34.8*  MCV 96.3 96.0 96.1 95.4 96.1 96.1  PLT 231 216 233 223 246 190   Basic Metabolic Panel:  Recent Labs Lab 05/05/16 0545 05/06/16 0500 05/07/16 0425 05/08/16 0415 05/09/16 0250  NA 137 137 137 137 138  K 3.6 3.2* 3.5 3.9 4.0  CL 106 105 106 107 105  CO2 22 24 25 27 26   GLUCOSE 112* 201* 150* 189* 117*  BUN 24* 23* 18 21* 22*  CREATININE 0.86 0.65 0.51* 0.58* 0.64  CALCIUM 8.1* 7.9* 8.0* 8.0* 8.1*  MG  --  1.8  --  1.9  --   PHOS  --  2.1* 2.8 2.4* 2.6   GFR: Estimated Creatinine Clearance: 73.6 mL/min (by C-G formula based on SCr of 0.64 mg/dL). Liver  Function Tests:  Recent Labs Lab 05/03/16 0242 05/04/16 0355 05/05/16 0545 05/06/16 0500 05/08/16 0415  AST 23 20 24 30  155*  ALT 20 19 17 18  53  ALKPHOS 76 76 73 91 317*  BILITOT 1.3* 1.4* 1.4* 0.6 0.4  PROT 5.1* 5.0* 4.9* 4.7* 4.9*  ALBUMIN 1.9* 1.7* 1.7* 1.7* 1.6*    Recent Labs Lab 05/02/16 1729  LIPASE 18   No results for input(s): AMMONIA in the last 168 hours. Coagulation Profile:  Recent Labs Lab 05/03/16 0242  INR 1.43   Cardiac Enzymes:  Recent Labs Lab 05/02/16 1729 05/02/16 2305 05/03/16 0242  TROPONINI 0.03* 0.04* 0.03*   BNP (last 3 results) No results for input(s): PROBNP in the last 8760 hours. HbA1C: No results for input(s): HGBA1C in the last 72 hours. CBG:  Recent Labs Lab 05/08/16 1642 05/08/16 2013 05/08/16 2359 05/09/16 0405 05/09/16 0854  GLUCAP 169* 130* 142* 113* 145*   Lipid Profile: No results for input(s): CHOL, HDL, LDLCALC, TRIG, CHOLHDL, LDLDIRECT  in the last 72 hours. Thyroid Function Tests: No results for input(s): TSH, T4TOTAL, FREET4, T3FREE, THYROIDAB in the last 72 hours. Anemia Panel: No results for input(s): VITAMINB12, FOLATE, FERRITIN, TIBC, IRON, RETICCTPCT in the last 72 hours. Urine analysis:    Component Value Date/Time   COLORURINE AMBER (A) 05/02/2016 1149   APPEARANCEUR CLOUDY (A) 05/02/2016 1149   LABSPEC 1.024 05/02/2016 1149   PHURINE 6.0 05/02/2016 1149   GLUCOSEU NEGATIVE 05/02/2016 1149   HGBUR NEGATIVE 05/02/2016 1149   BILIRUBINUR SMALL (A) 05/02/2016 1149   KETONESUR NEGATIVE 05/02/2016 1149   PROTEINUR NEGATIVE 05/02/2016 1149   UROBILINOGEN 0.2 03/08/2010 1026   NITRITE NEGATIVE 05/02/2016 1149   LEUKOCYTESUR SMALL (A) 05/02/2016 1149   Sepsis Labs: @LABRCNTIP (procalcitonin:4,lacticidven:4)  ) Recent Results (from the past 240 hour(s))  Culture, blood (Routine x 2)     Status: None   Collection Time: 05/02/16 11:45 AM  Result Value Ref Range Status   Specimen Description BLOOD LEFT ANTECUBITAL  Final   Special Requests BOTTLES DRAWN AEROBIC AND ANAEROBIC 5CC  Final   Culture NO GROWTH 5 DAYS  Final   Report Status 05/07/2016 FINAL  Final  Urine culture     Status: None   Collection Time: 05/02/16 11:49 AM  Result Value Ref Range Status   Specimen Description URINE, CLEAN CATCH  Final   Special Requests NONE  Final   Culture NO GROWTH  Final   Report Status 05/03/2016 FINAL  Final  Culture, blood (Routine x 2)     Status: None   Collection Time: 05/02/16 12:40 PM  Result Value Ref Range Status   Specimen Description BLOOD RIGHT ANTECUBITAL  Final   Special Requests BOTTLES DRAWN AEROBIC AND ANAEROBIC  Final   Culture NO GROWTH 5 DAYS  Final   Report Status 05/07/2016 FINAL  Final  MRSA PCR Screening     Status: None   Collection Time: 05/02/16  6:32 PM  Result Value Ref Range Status   MRSA by PCR NEGATIVE NEGATIVE Final    Comment:        The GeneXpert MRSA Assay  (FDA approved for NASAL specimens only), is one component of a comprehensive MRSA colonization surveillance program. It is not intended to diagnose MRSA infection nor to guide or monitor treatment for MRSA infections.   Aerobic/Anaerobic Culture (surgical/deep wound)     Status: None   Collection Time: 05/03/16  2:10 PM  Result Value Ref Range Status  Specimen Description DRAINAGE  Final   Special Requests Normal  Final   Gram Stain   Final    ABUNDANT WBC PRESENT, PREDOMINANTLY PMN MODERATE GRAM NEGATIVE RODS    Culture MODERATE ESCHERICHIA COLI NO ANAEROBES ISOLATED   Final   Report Status 05/08/2016 FINAL  Final   Organism ID, Bacteria ESCHERICHIA COLI  Final      Susceptibility   Escherichia coli - MIC*    AMPICILLIN >=32 RESISTANT Resistant     CEFAZOLIN <=4 SENSITIVE Sensitive     CEFEPIME <=1 SENSITIVE Sensitive     CEFTAZIDIME <=1 SENSITIVE Sensitive     CEFTRIAXONE <=1 SENSITIVE Sensitive     CIPROFLOXACIN >=4 RESISTANT Resistant     GENTAMICIN <=1 SENSITIVE Sensitive     IMIPENEM <=0.25 SENSITIVE Sensitive     TRIMETH/SULFA >=320 RESISTANT Resistant     AMPICILLIN/SULBACTAM >=32 RESISTANT Resistant     PIP/TAZO <=4 SENSITIVE Sensitive     Extended ESBL NEGATIVE Sensitive     * MODERATE ESCHERICHIA COLI     Invalid input(s): PROCALCITONIN, LACTICACIDVEN   Radiology Studies: Dg Chest 1 View  Result Date: 05/08/2016 CLINICAL DATA:  Coughing episode EXAM: CHEST 1 VIEW COMPARISON:  05/03/2016 FINDINGS: Semi-erect portable view chest. A right upper extremity catheter tip overlies the cavoatrial junction. Esophageal tube tip is not included but probably positioned over duodenum. Markedly low lung volumes. Probable tiny effusions. Mild bibasilar atelectasis. No focal consolidations. Stable cardiomediastinal silhouette. No pneumothorax. Postsurgical changes in the right lung apex. Old right-sided rib deformities. IMPRESSION: 1. Markedly low lung volumes with  suspected tiny effusions. 2. Mild bibasilar atelectasis. Electronically Signed   By: Jasmine Pang M.D.   On: 05/08/2016 16:33   Dg Abd 1 View  Result Date: 05/08/2016 CLINICAL DATA:  NG tube placement EXAM: ABDOMEN - 1 VIEW COMPARISON:  05/06/2016 FINDINGS: Motion artifact limits the examination. Esophageal tube tip overlies the second portion of the duodenum. Some gaseous dilatation of bowel could relate to a mild ileus. IMPRESSION: Esophageal tube tip overlies the second portion of the duodenum. Mild gaseous dilatation of bowel could relate to a mild ileus. Electronically Signed   By: Jasmine Pang M.D.   On: 05/08/2016 16:35        Scheduled Meds: . atorvastatin  10 mg Oral q1800  . cefTRIAXone (ROCEPHIN)  IV  2 g Intravenous Q24H  . chlorhexidine  15 mL Mouth Rinse BID  . fluconazole (DIFLUCAN) IV  100 mg Intravenous Q24H  . insulin aspart  0-15 Units Subcutaneous Q4H  . lipase/protease/amylase  36,000 Units Oral TID AC  . mouth rinse  15 mL Mouth Rinse q12n4p  . metoprolol  100 mg Oral BID  . pantoprazole (PROTONIX) IV  40 mg Intravenous Q12H  . sodium chloride flush  10-40 mL Intracatheter Q12H  . sodium chloride flush  3 mL Intravenous Q12H   Continuous Infusions: . sodium chloride 35 mL/hr at 05/09/16 0856  . diltiazem (CARDIZEM) infusion    . Marland KitchenTPN (CLINIMIX-E) Adult 40 mL/hr at 05/08/16 1730   And  . fat emulsion 240 mL (05/08/16 1731)  . feeding supplement (VITAL AF 1.2 CAL) Stopped (05/08/16 1430)     LOS: 7 days    Time spent: 35 minutes    Richarda Overlie, MD [facility] Pager [phone]  If 7PM-7AM, please contact night-coverage [email or web address] Password [email or web address] 05/09/2016, 9:04 AM

## 2016-05-09 NOTE — Progress Notes (Addendum)
Physical Therapy Treatment Patient Details Name: Colin West MRN: [id number] DOB: 08-12-1933 Today's Date: 05/09/2016    History of Present Illness Pt readmitted with pancreatic abcessess. Pt with 2 recent admissions for hemorrhagic gallstone pancreatitis and sepsis. PMH - HTN, DM    PT Comments    Pt presented supine in bed with HOB elevated, awake and willing to participate in therapy session. Pt continuing to demonstrate increased fear during ambulation and self-limited distance he is able to walk. Pt also needing lots of VC'ing and min A for safety with RW. Pt would continue to benefit from skilled physical therapy services at this time while admitted and after d/c to address his limitations in order to improve his overall safety and independence with functional mobility.   Follow Up Recommendations  SNF     Equipment Recommendations  None recommended by PT    Recommendations for Other Services       Precautions / Restrictions Precautions Precautions: Fall Restrictions Weight Bearing Restrictions: No    Mobility  Bed Mobility Overal bed mobility: Needs Assistance Bed Mobility: Supine to Sit     Supine to sit: Min assist     General bed mobility comments: min A to elevate trunk  Transfers Overall transfer level: Needs assistance Equipment used: Rolling walker (2 wheeled) Transfers: Sit to/from Stand Sit to Stand: Min assist         General transfer comment: pt required increased time, VC"ing for bilateral UE placement and min A for stability  Ambulation/Gait Ambulation/Gait assistance: Min assist Ambulation Distance (Feet): 5 Feet Assistive device: Rolling walker (2 wheeled) Gait Pattern/deviations: Step-through pattern;Decreased stride length;Shuffle;Trunk flexed Gait velocity: decreased Gait velocity interpretation: <1.8 ft/sec, indicative of risk for recurrent falls General Gait Details: pt required frequent VC'ing and min A to maintain a safe  distance from RW. Pt very fearful and anxious and would not ambulate any further.   Stairs            Wheelchair Mobility    Modified Rankin (Stroke Patients Only)       Balance Overall balance assessment: Needs assistance Sitting-balance support: Feet supported Sitting balance-Leahy Scale: Fair     Standing balance support: During functional activity;Bilateral upper extremity supported Standing balance-Leahy Scale: Poor Standing balance comment: pt reliant on bilateral UEs on RW                    Cognition Arousal/Alertness: Awake/alert Behavior During Therapy: Anxious Overall Cognitive Status: Within Functional Limits for tasks assessed                      Exercises      General Comments        Pertinent Vitals/Pain Pain Assessment: Faces Faces Pain Scale: No hurt Pain Intervention(s): Monitored during session   All VSS throughout.    Home Living                      Prior Function            PT Goals (current goals can now be found in the care plan section) Acute Rehab PT Goals Patient Stated Goal: get better PT Goal Formulation: With patient/family Time For Goal Achievement: 05/17/16 Potential to Achieve Goals: Good Progress towards PT goals: Progressing toward goals    Frequency    Min 3X/week      PT Plan Current plan remains appropriate    Co-evaluation  End of Session Equipment Utilized During Treatment: Gait belt Activity Tolerance: Patient limited by fatigue;Other (comment) (limited secondary to anxiety/fear) Patient left: in chair;with call bell/phone within reach;with chair alarm set;with family/visitor present     Time: [id number] PT Time Calculation (min) (ACUTE ONLY): 16 min  Charges:  $Gait Training: 8-22 mins                    G CodesAlessandra Bevels:      Naseem Adler M Alleyah Twombly 05/09/2016, 11:56 AM Deborah ChalkJennifer Verena Shawgo, PT, DPT [phone]

## 2016-05-09 NOTE — Progress Notes (Signed)
Pharmacy Antibiotic Note  Colin West is a 82 y.o. male admitted on 05/02/2016 with pancreatic abscesses and possible PNA.  Pharmacy has been consulted for vancomycin dosing.  Plan: - Continue ceftriaxone 1g IV q24h for pancreatic abscesses - Start vancomycin 750 mg IV q12h - Monitor C&S, CBC and length of therapy - Monitor vancomycin trough at steady state when appropriate  Height: 5\' 9"  (175.3 cm) Weight: 174 lb (78.9 kg) IBW/kg (Calculated) : 70.7  Temp (24hrs), Avg:98 F (36.7 C), Min:97.7 F (36.5 C), Max:98.2 F (36.8 C)   Recent Labs Lab 05/02/16 1224 05/02/16 1512 05/03/16 0242 05/04/16 0355 05/05/16 0545 05/06/16 0500 05/07/16 0425 05/08/16 0415 05/08/16 1700 05/09/16 0250  WBC  --   --  23.6* 21.0* 18.2* 15.5*  --  16.3*  --   --   CREATININE  --   --  0.81 0.77 0.86 0.65 0.51* 0.58*  --  0.64  LATICACIDVEN 2.28* 1.86  --   --   --   --   --   --  1.3  --     Estimated Creatinine Clearance: 73.6 mL/min (by C-G formula based on SCr of 0.64 mg/dL).    No Known Allergies  Antimicrobials this admission:  Zosyn 11/17 >> 11/18 Fluc 11/17>> Primaxin 11/18>> 11/24 Ceftriaxone 11/24 >> Vancomycin 11/24>>  Microbiology results:  11/17 BCx: neg 11/17 UCx: neg  11/18 Abscess from drain: Ecoli (S- imipenem, CTX) 11/17 MRSA PCR: neg  Thank you for allowing pharmacy to be a part of this patient's care.  Casilda Carlsaylor Keygan Dumond, PharmD. PGY-2 Infectious Diseases Pharmacy Resident Pager: [phone] 05/09/2016 10:47 AM

## 2016-05-10 ENCOUNTER — Inpatient Hospital Stay (HOSPITAL_COMMUNITY): Payer: Medicare Other

## 2016-05-10 DIAGNOSIS — Z789 Other specified health status: Secondary | ICD-10-CM

## 2016-05-10 DIAGNOSIS — Z4659 Encounter for fitting and adjustment of other gastrointestinal appliance and device: Secondary | ICD-10-CM

## 2016-05-10 LAB — COMPREHENSIVE METABOLIC PANEL
ALT: 43 U/L (ref 17–63)
AST: 53 U/L — AB (ref 15–41)
Albumin: 1.6 g/dL — ABNORMAL LOW (ref 3.5–5.0)
Alkaline Phosphatase: 257 U/L — ABNORMAL HIGH (ref 38–126)
Anion gap: 5 (ref 5–15)
BUN: 20 mg/dL (ref 6–20)
CHLORIDE: 105 mmol/L (ref 101–111)
CO2: 25 mmol/L (ref 22–32)
CREATININE: 0.64 mg/dL (ref 0.61–1.24)
Calcium: 8 mg/dL — ABNORMAL LOW (ref 8.9–10.3)
GFR calc Af Amer: >60 mL/min (ref >60)
GFR calc non Af Amer: >60 mL/min (ref >60)
GLUCOSE: 112 mg/dL — AB (ref 65–99)
Potassium: 3.7 mmol/L (ref 3.5–5.1)
SODIUM: 135 mmol/L (ref 135–145)
Total Bilirubin: 0.6 mg/dL (ref 0.3–1.2)
Total Protein: 4.9 g/dL — ABNORMAL LOW (ref 6.5–8.1)

## 2016-05-10 LAB — GLUCOSE, CAPILLARY
GLUCOSE-CAPILLARY: 121 mg/dL — AB (ref 65–99)
GLUCOSE-CAPILLARY: 133 mg/dL — AB (ref 65–99)
GLUCOSE-CAPILLARY: 137 mg/dL — AB (ref 65–99)
GLUCOSE-CAPILLARY: 140 mg/dL — AB (ref 65–99)
GLUCOSE-CAPILLARY: 152 mg/dL — AB (ref 65–99)
Glucose-Capillary: 109 mg/dL — ABNORMAL HIGH (ref 65–99)
Glucose-Capillary: 149 mg/dL — ABNORMAL HIGH (ref 65–99)

## 2016-05-10 LAB — CBC
HCT: 32.2 % — ABNORMAL LOW (ref 39.0–52.0)
HEMOGLOBIN: 10.8 g/dL — AB (ref 13.0–17.0)
MCH: 31.9 pg (ref 26.0–34.0)
MCHC: 33.5 g/dL (ref 30.0–36.0)
MCV: 95 fL (ref 78.0–100.0)
Platelets: 171 10*3/uL (ref 150–400)
RBC: 3.39 MIL/uL — AB (ref 4.22–5.81)
RDW: 14.2 % (ref 11.5–15.5)
WBC: 14.1 10*3/uL — ABNORMAL HIGH (ref 4.0–10.5)

## 2016-05-10 LAB — MAGNESIUM: Magnesium: 1.8 mg/dL (ref 1.7–2.4)

## 2016-05-10 LAB — PHOSPHORUS: Phosphorus: 3.2 mg/dL (ref 2.5–4.6)

## 2016-05-10 LAB — LIPASE, BLOOD: LIPASE: 18 U/L (ref 11–51)

## 2016-05-10 MED ORDER — FAT EMULSION 20 % IV EMUL
240.0000 mL | INTRAVENOUS | Status: AC
  Administered 2016-05-10: 240 mL via INTRAVENOUS
  Filled 2016-05-10: qty 250

## 2016-05-10 MED ORDER — JEVITY 1.2 CAL PO LIQD
1000.0000 mL | ORAL | Status: DC
  Administered 2016-05-10: 1000 mL
  Filled 2016-05-10 (×2): qty 1000

## 2016-05-10 MED ORDER — DIPHENHYDRAMINE HCL 25 MG PO CAPS
25.0000 mg | ORAL_CAPSULE | Freq: Once | ORAL | Status: AC
  Administered 2016-05-10: 25 mg via ORAL
  Filled 2016-05-10: qty 1

## 2016-05-10 MED ORDER — SODIUM CHLORIDE 0.9 % IV SOLN
500.0000 mg | Freq: Four times a day (QID) | INTRAVENOUS | Status: DC
  Administered 2016-05-10 – 2016-05-21 (×45): 500 mg via INTRAVENOUS
  Filled 2016-05-10 (×48): qty 500

## 2016-05-10 MED ORDER — TRACE MINERALS CR-CU-MN-SE-ZN 10-1000-500-60 MCG/ML IV SOLN
INTRAVENOUS | Status: AC
  Administered 2016-05-10: 18:00:00 via INTRAVENOUS
  Filled 2016-05-10: qty 1560

## 2016-05-10 NOTE — Plan of Care (Signed)
Problem: Safety: Goal: Ability to remain free from injury will improve Outcome: Progressing Patient calls for assistance. Gets out of bed to chair with standby to minimal assist.   Problem: Skin Integrity: Goal: Risk for impaired skin integrity will decrease Outcome: Progressing Patient turned every 2 hours. Prophylactic foam placed to prevent skin breakdown over sacrum and barrier cream applied to skin to prevent skin breakdown from moisture.   Problem: Tissue Perfusion: Goal: Risk factors for ineffective tissue perfusion will decrease Outcome: Progressing SCD's on patient.  Problem: Activity: Goal: Risk for activity intolerance will decrease Outcome: Progressing Patient was up to chair twice on AM shift. For two hours the first time and one hour a second time. Patient encouraged to work with PT and sit up as much as possible to foster regaining some strength.  Problem: Nutrition: Goal: Adequate nutrition will be maintained Outcome: Progressing Patient tube feeds were resumed at 4220ml/hr. Patient dislodged cortrak. However, replacement put tube terminating in jejunum, which will be better for tube feed tolerance. No evidence of difficult with tube feeds today.

## 2016-05-10 NOTE — Progress Notes (Signed)
Pharmacy Antibiotic Note  Colin West is a 82 y.o. male admitted on 05/02/2016 with pancreatic abscesses and possible PNA.  Pharmacy has been consulted for Primaxin dosing. Patient was previously being treated with Primaxin and was switched to ceftriaxone but surgery prefers Primaxin. WBC down to 14.1. Patient is afebrile. Kidney function is stable with CrCl ~ 70 ml/min. Last dose of ceftriaxone 11/24 1300.   Plan: Imipenem/cilastatin 500 mg every 6 hours Monitor renal function Monitor clinical s/sx of infection, LOT   Height: 5\' 9"  (175.3 cm) Weight: 178 lb (80.7 kg) IBW/kg (Calculated) : 70.7  Temp (24hrs), Avg:97.9 F (36.6 C), Min:97.9 F (36.6 C), Max:97.9 F (36.6 C)   Recent Labs Lab 05/04/16 0355 05/05/16 0545 05/06/16 0500 05/07/16 0425 05/08/16 0415 05/08/16 1700 05/09/16 0250 05/10/16 0229  WBC 21.0* 18.2* 15.5*  --  16.3*  --   --  14.1*  CREATININE 0.77 0.86 0.65 0.51* 0.58*  --  0.64 0.64  LATICACIDVEN  --   --   --   --   --  1.3  --   --     Estimated Creatinine Clearance: 73.6 mL/min (by C-G formula based on SCr of 0.64 mg/dL).    No Known Allergies  Antimicrobials this admission: Zosyn 11/17 >> 11/18 Fluc 11/17>> Primaxin 11/18>> 11/24, 11/25>> Ceftriaxone 11/24 >>11/25 Vancomycin 11/24>>   Microbiology results: 11/17 BCx: neg 11/17 UCx: neg  11/18 Abscess from drain: Ecoli (S- imipenem, CTX) 11/17 MRSA PCR: neg   Thank you for allowing pharmacy to be a part of this patient's care.  Hillis RangeEmily A Stewart, PharmD PGY1 Pharmacy Resident Pager: [phone]  05/10/2016 8:37 AM

## 2016-05-10 NOTE — Progress Notes (Signed)
PROGRESS NOTE  Colin West  ZOX:[id number] DOB: Jun 16, 1934 DOA: 05/02/2016 PCP: [name], MD Outpatient Specialists:  Subjective: THICK secretions , cough noted for 2 days Tube feeds held for possible aspiration    Brief Narrative:  Colin West is a 82 y.o. male  with complex history recently hospitalized from 10/8-10/12 for hemorrhagic pancreatitis from gallblader source, and on  11/7-11/13 for Severe Sepsis due to E.Coli pyelonephritis, discharged to  [facility] on oral antibiotics, brought to the emergency department with worsening generalized weakness, today  evere enough with  associated nausea and vomiting. He denies shortness of breath or productive cough. He has been unable to ambulate due to weakness. He denies any abdominal pain, he has occasional nausea without vomiting, he denies any diarrhea or constipation. He has not been able to eat a normal meal, lost about 15 lbs in the last 2 months.  He reports frequent hiccups. He denies any lower extremity swelling. Due to prolonged bed rest, he has developed some the ulcers in the sacrum and gluteal  areas as well as some minimal heel blanching hese is being cared at the facility as well. 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 -CT scan showed multiple peripancreatic abscesses, was recently in the hospital from 10/8-10/12 for hemorrhagic pancreatitis from gallbladder  source. -Currently on Primaxin and Diflucan, likely he will need prolonged course of antibiotics at least perioperatively. Abscess now growing Escherichia coli  . Surgery recommends to continue Primaxin -Patient developed pancreatic necrosis while he was eating, need to be strict NPO with postpyloric feeding. Continue TPN Restarted tube feeds at a slow rate -Discussed with Dr. Derrell Lolling, patient's nutritional and functional status needs to improve prior to consider surgery which seems unlikely. -patient refuses to go back to SNF in the interval that he is waiting for surgery Surgery does not seem to think that laparoscopic cholecystectomy at this time we will   improve his pancreatic abscesses Cardiology reconsulted 11/22-if patient requires laparoscopic surgery-moderate risk, if patient requires open surgery, it would be considered high risk, at this point cannot rule out ischemia, no further cardiac testing recommended. Continue perioperative beta blocker. Proceed with surgery if indicated Repeat 2-D echo shows mild improvement in EF to 35-40%. Continue   beta blocker     Cough /probable HCAP Suspect developing  pna  Add vancomycin to rocephin  Counseled RN to maintain strict aspiration precautions and pulmonary toilet Leukocytosis improving   Protein calorie malnutrition, severe  -Lost 20 pounds in the past 2 months, albumin continues to decline -Per general surgery recommendation try postpyloric enteral nutrition prior to TPN. Currently on both TPN and enteric feeding held because of ileus, now resumed -CORTRAK, in place  Atrial fibrillation, transient -Had run of atrial fibrillation with heart rate in the 130s, back to sinus rhythm with IV metoprolol. Continue oral metoprolol -Not sure if he needs anticoagulation for transient transient A. fib.  TSH  2.319  Type II Diabetes Current blood sugar level is 174 Currently  nothing by mouth, per general surgery once into postpyloric  feeding.     Hypertension BP 125/65   Pulse 86   Controlled Continue home anti-hypertensive medications    Hiccups This is likely secondary to the irritation of the diaphragm from pancreatic abscess, on Reglan.  Hyperlipidemia Continue home statins   Dilated Cardiomyopathy in view of recent sepsis 2 D echo recently shows EF 30-35 % 11/10,    Cautiously monitor fluid status   ?Dementia/encephalopathic   Minimize narcotics     DVT prophylaxis:  Code Status: Full Code Family Communication:  Discussed with son and brother by the bedside Disposition Plan: refusing SNF,  Not stable for DC   Diet: Diet NPO time specified Except for: Sips with Meds TPN (CLINIMIX-E) Adult  Consultants:   Gen. surgery.  Interventional radiology  Cardiology  Procedures:   Aspiration of abscess on 05/04/2016  Antimicrobials:   Zosyn and vancomycin changed to Primaxin on 05/03/2016  Objective: Vitals:   05/09/16 1000 05/09/16 2216 05/10/16 0500 05/10/16 0807  BP: 136/69 129/62  (!) 147/76  Pulse: 78 88  80  Resp: 20   20  Temp: 97.9 F (36.6 C)   97.9 F (36.6 C)  TempSrc: Oral   Oral  SpO2: 97%   97%  Weight:   80.7 kg (178 lb)   Height:        Intake/Output Summary (Last 24 hours) at 05/10/16 0834 Last data filed at 05/10/16 0700  Gross per 24 hour  Intake           656.25 ml  Output              325 ml  Net           331.25 ml   Filed Weights   05/08/16 0412 05/09/16 0254 05/10/16 0500  Weight: 76.2 kg (168 lb) 78.9 kg (174 lb) 80.7 kg (178 lb)    Examination: General exam: Appears calm and comfortable  Respiratory system: Clear to auscultation. Respiratory effort normal. Cardiovascular system: S1 & S2 heard, RRR. No JVD, murmurs, rubs, gallops or clicks. No pedal edema. Gastrointestinal system: Abdomen is nondistended, soft and nontender. No organomegaly or masses felt. Normal bowel sounds heard. Central nervous system: Alert and oriented. No focal  neurological deficits. Extremities: Symmetric 5 x 5 power. Skin: No rashes, lesions or ulcers Psychiatry: Judgement and insight appear normal. Mood & affect appropriate.   Data Reviewed: I have personally reviewed following labs and imaging studies  CBC:  Recent Labs Lab 05/04/16 0355 05/05/16 0545 05/06/16 0500 05/08/16 0415 05/10/16 0229  WBC 21.0* 18.2* 15.5* 16.3* 14.1*  NEUTROABS  --   --  11.8*  --   --   HGB 11.1* 11.0* 11.1* 11.3* 10.8*  HCT 34.4* 33.2* 34.5* 34.8* 32.2*  MCV 96.1 95.4 96.1 96.1 95.0  PLT 233 223 246 190 171   Basic Metabolic Panel:  Recent Labs Lab 05/06/16 0500 05/07/16 0425 05/08/16 0415 05/09/16 0250 05/10/16 0229  NA 137 137 137 138 135  K 3.2* 3.5 3.9 4.0 3.7  CL 105 106 107 105 105  CO2 24 25 27 26 25   GLUCOSE 201* 150* 189* 117* 112*  BUN 23* 18 21* 22* 20  CREATININE 0.65 0.51* 0.58* 0.64 0.64  CALCIUM 7.9* 8.0* 8.0* 8.1* 8.0*  MG 1.8  --  1.9  --  1.8  PHOS 2.1* 2.8 2.4* 2.6 3.2   GFR: Estimated Creatinine Clearance: 73.6 mL/min (by C-G formula based on SCr of  0.64 mg/dL). Liver Function Tests:  Recent Labs Lab 05/04/16 0355 05/05/16 0545 05/06/16 0500 05/08/16 0415 05/10/16 0229  AST 20 24 30  155* 53*  ALT 19 17 18  53 43  ALKPHOS 76 73 91 317* 257*  BILITOT 1.4* 1.4* 0.6 0.4 0.6  PROT 5.0* 4.9* 4.7* 4.9* 4.9*  ALBUMIN 1.7* 1.7* 1.7* 1.6* 1.6*    Recent Labs Lab 05/10/16 0229  LIPASE 18   No results for input(s): AMMONIA in the last 168 hours. Coagulation Profile: No results for input(s): INR, PROTIME in the last 168 hours. Cardiac Enzymes: No results for input(s): CKTOTAL, CKMB, CKMBINDEX, TROPONINI in the last 168 hours. BNP (last 3 results) No results for input(s): PROBNP in the last 8760 hours. HbA1C: No results for input(s): HGBA1C in the last 72 hours. CBG:  Recent Labs Lab 05/09/16 1706 05/09/16 2009 05/10/16 0043 05/10/16 0501 05/10/16 0806  GLUCAP 132* 125* 149* 109* 121*   Lipid  Profile: No results for input(s): CHOL, HDL, LDLCALC, TRIG, CHOLHDL, LDLDIRECT in the last 72 hours. Thyroid Function Tests: No results for input(s): TSH, T4TOTAL, FREET4, T3FREE, THYROIDAB in the last 72 hours. Anemia Panel: No results for input(s): VITAMINB12, FOLATE, FERRITIN, TIBC, IRON, RETICCTPCT in the last 72 hours. Urine analysis:    Component Value Date/Time   COLORURINE AMBER (A) 05/02/2016 1149   APPEARANCEUR CLOUDY (A) 05/02/2016 1149   LABSPEC 1.024 05/02/2016 1149   PHURINE 6.0 05/02/2016 1149   GLUCOSEU NEGATIVE 05/02/2016 1149   HGBUR NEGATIVE 05/02/2016 1149   BILIRUBINUR SMALL (A) 05/02/2016 1149   KETONESUR NEGATIVE 05/02/2016 1149   PROTEINUR NEGATIVE 05/02/2016 1149   UROBILINOGEN 0.2 03/08/2010 1026   NITRITE NEGATIVE 05/02/2016 1149   LEUKOCYTESUR SMALL (A) 05/02/2016 1149   Sepsis Labs: @LABRCNTIP (procalcitonin:4,lacticidven:4)  ) Recent Results (from the past 240 hour(s))  Culture, blood (Routine x 2)     Status: None   Collection Time: 05/02/16 11:45 AM  Result Value Ref Range Status   Specimen Description BLOOD LEFT ANTECUBITAL  Final   Special Requests BOTTLES DRAWN AEROBIC AND ANAEROBIC 5CC  Final   Culture NO GROWTH 5 DAYS  Final   Report Status 05/07/2016 FINAL  Final  Urine culture     Status: None   Collection Time: 05/02/16 11:49 AM  Result Value Ref Range Status   Specimen Description URINE, CLEAN CATCH  Final   Special Requests NONE  Final   Culture NO GROWTH  Final   Report Status 05/03/2016 FINAL  Final  Culture, blood (Routine x 2)     Status: None   Collection Time: 05/02/16 12:40 PM  Result Value Ref Range Status   Specimen Description BLOOD RIGHT ANTECUBITAL  Final   Special Requests BOTTLES DRAWN AEROBIC AND ANAEROBIC 10ML  Final   Culture NO GROWTH 5 DAYS  Final   Report Status 05/07/2016 FINAL  Final  MRSA PCR Screening     Status: None   Collection Time: 05/02/16  6:32 PM  Result Value Ref Range Status   MRSA by PCR  NEGATIVE NEGATIVE Final    Comment:        The GeneXpert MRSA Assay (FDA approved for NASAL specimens only), is one component of a comprehensive MRSA colonization surveillance program. It is not intended to diagnose MRSA infection nor to guide or monitor treatment for MRSA infections.   Aerobic/Anaerobic Culture (surgical/deep wound)     Status: None   Collection Time: 05/03/16  2:10 PM  Result Value Ref Range Status   Specimen  Description DRAINAGE  Final   Special Requests Normal  Final   Gram Stain   Final    ABUNDANT WBC PRESENT, PREDOMINANTLY PMN MODERATE GRAM NEGATIVE RODS    Culture MODERATE ESCHERICHIA COLI NO ANAEROBES ISOLATED   Final   Report Status 05/08/2016 FINAL  Final   Organism ID, Bacteria ESCHERICHIA COLI  Final      Susceptibility   Escherichia coli - MIC*    AMPICILLIN >=32 RESISTANT Resistant     CEFAZOLIN <=4 SENSITIVE Sensitive     CEFEPIME <=1 SENSITIVE Sensitive     CEFTAZIDIME <=1 SENSITIVE Sensitive     CEFTRIAXONE <=1 SENSITIVE Sensitive     CIPROFLOXACIN >=4 RESISTANT Resistant     GENTAMICIN <=1 SENSITIVE Sensitive     IMIPENEM <=0.25 SENSITIVE Sensitive     TRIMETH/SULFA >=320 RESISTANT Resistant     AMPICILLIN/SULBACTAM >=32 RESISTANT Resistant     PIP/TAZO <=4 SENSITIVE Sensitive     Extended ESBL NEGATIVE Sensitive     * MODERATE ESCHERICHIA COLI     Invalid input(s): PROCALCITONIN, LACTICACIDVEN   Radiology Studies: Dg Chest 1 View  Result Date: 05/08/2016 CLINICAL DATA:  Coughing episode EXAM: CHEST 1 VIEW COMPARISON:  05/03/2016 FINDINGS: Semi-erect portable view chest. A right upper extremity catheter tip overlies the cavoatrial junction. Esophageal tube tip is not included but probably positioned over duodenum. Markedly low lung volumes. Probable tiny effusions. Mild bibasilar atelectasis. No focal consolidations. Stable cardiomediastinal silhouette. No pneumothorax. Postsurgical changes in the right lung apex. Old right-sided  rib deformities. IMPRESSION: 1. Markedly low lung volumes with suspected tiny effusions. 2. Mild bibasilar atelectasis. Electronically Signed   By: Jasmine Pang M.D.   On: 05/08/2016 16:33   Dg Abd 1 View  Result Date: 05/08/2016 CLINICAL DATA:  NG tube placement EXAM: ABDOMEN - 1 VIEW COMPARISON:  05/06/2016 FINDINGS: Motion artifact limits the examination. Esophageal tube tip overlies the second portion of the duodenum. Some gaseous dilatation of bowel could relate to a mild ileus. IMPRESSION: Esophageal tube tip overlies the second portion of the duodenum. Mild gaseous dilatation of bowel could relate to a mild ileus. Electronically Signed   By: Jasmine Pang M.D.   On: 05/08/2016 16:35   Ct Abdomen Pelvis W Contrast  Result Date: 05/09/2016 CLINICAL DATA:  Hemorrhagic pancreatitis, nausea, vomiting. EXAM: CT ABDOMEN AND PELVIS WITH CONTRAST TECHNIQUE: Multidetector CT imaging of the abdomen and pelvis was performed using the standard protocol following bolus administration of intravenous contrast. CONTRAST:  ISOVUE-300 IOPAMIDOL (ISOVUE-300) INJECTION 61% COMPARISON:  CT scan of May 02, 2016. FINDINGS: Lower chest: Mild left pleural effusion is noted with adjacent subsegmental atelectasis. Hepatobiliary: No gallstones are noted. Mild amount of fluid is noted around the liver. No focal hepatic lesion is noted. Pancreas: There is again noted multifocal abscesses within the retroperitoneum surrounding atretic pancreas. Increased amount of gas is present within the larger loculations. Overall, does not appear to be significantly changed in size. Spleen: Normal. Adrenals/Urinary Tract: Right adrenal gland appears normal. Stable left adrenal gland enlargement is noted. Right nephrolithiasis is noted. No hydronephrosis or renal obstruction is noted. Urinary bladder appears normal. Stomach/Bowel: Feeding tube is seen with distal tip in third portion of duodenum. There is no evidence of bowel  obstruction. Normal appendix. Diverticulosis of descending and sigmoid colon is noted without inflammation. Vascular/Lymphatic: Atherosclerosis of abdominal aorta is noted without aneurysm formation. Celiac, superior mesenteric and and inferior mesenteric arteries are patent. Reproductive: Stable mild prostatic enlargement with calcifications is noted. Other:  Moderate anasarca is noted. Musculoskeletal: Severe multifocal degenerative disc disease is noted. IMPRESSION: Mild left pleural effusion with adjacent subsegmental atelectasis. Aortic atherosclerosis. Feeding tube seen with distal tip in third portion of duodenum. Diverticulosis of descending and sigmoid colon is noted without inflammation. Stable mild prostatic enlargement. Moderate anasarca. Continued presence of multifocal abscesses within the retroperitoneal surrounding atretic pancreas, which does not appear to be significantly is changed in size, although there is an increased amount of gas present within the loculations consistent with infection. Electronically Signed   By: Lupita Raider, M.D.   On: 05/09/2016 17:15        Scheduled Meds: . atorvastatin  10 mg Oral q1800  . chlorhexidine  15 mL Mouth Rinse BID  . fluconazole (DIFLUCAN) IV  100 mg Intravenous Q24H  . guaiFENesin-dextromethorphan  5 mL Oral Q8H  . insulin aspart  0-15 Units Subcutaneous Q4H  . lipase/protease/amylase  36,000 Units Oral TID AC  . mouth rinse  15 mL Mouth Rinse q12n4p  . metoprolol  100 mg Oral BID  . pantoprazole (PROTONIX) IV  40 mg Intravenous Q12H  . sodium chloride flush  10-40 mL Intracatheter Q12H  . sodium chloride flush  3 mL Intravenous Q12H  . vancomycin  750 mg Intravenous Q12H   Continuous Infusions: . sodium chloride 10 mL/hr at 05/09/16 1039  . diltiazem (CARDIZEM) infusion    . Marland KitchenTPN (CLINIMIX-E) Adult 65 mL/hr at 05/09/16 1728   And  . fat emulsion 240 mL (05/09/16 1728)  . feeding supplement (JEVITY 1.2 CAL)    . feeding  supplement (VITAL AF 1.2 CAL) Stopped (05/08/16 1430)     LOS: 8 days    Time spent: 35 minutes    Tal Neer, MD [facility] Pager [phone]  If 7PM-7AM, please contact night-coverage [email or web address] Password [facility] 05/10/2016, 8:34 AM

## 2016-05-10 NOTE — Progress Notes (Signed)
PT Cancellation Note  Patient Details Name: Colin West N Tiu MRN: [id number] DOB: 1933/09/04   Cancelled Treatment:    Reason Eval/Treat Not Completed: Patient declined, no reason specified. Pt reporting that he just recently got back into bed after sitting up in recliner for two hours. Pt's nurse and dietician were in room replacing cortrak tube at that time as well. PT will continue to f/u with pt as appropriate.   Alessandra BevelsJennifer M Marabella Popiel 05/10/2016, 12:37 PM Deborah ChalkJennifer Drayton Tieu, PT, DPT [phone]

## 2016-05-10 NOTE — Progress Notes (Signed)
Patient dislodged cortrak tube when transferring back to bed. Cortrak team consulted to replace. Tube feeds paused at this time.   Noe GensStefanie A Brennan Litzinger, RN

## 2016-05-10 NOTE — Progress Notes (Signed)
Nutrition Follow-up  DOCUMENTATION CODES:  Severe malnutrition in context of acute illness/injury  INTERVENTION:  Restart  Vit AF 1.2 @ 20 cc/hr. Provides. 576 kcals, 36 g Pro, and 389 ml fluid  Tube is now distal to LOT and he may be better able to tolerate TF. Would consider trialing an increase in the TF rate in attempt to decrease TPN.   NUTRITION DIAGNOSIS:  Malnutrition (Severe) related to acute illness (Pancreatitis) as evidenced by severe depletion of muscle mass, severe depletion of body fat, energy intake < or equal to 50% for > or equal to 5 days.  Resolving (meeting needs w/ TF+ TPN)  GOAL:  Patient will meet greater than or equal to 90% of their needs   Met w/ TPN + TF  MONITOR:  TF tolerance, Skin, I & O's, Labs  REASON FOR ASSESSMENT:  Consult Enteral/tube feeding initiation and management  ASSESSMENT:  82 year old male with a history of HTN, DM diet-controlled, hyperlipidemia. Patient was recently admitted with hemorrhagic pancreatitis secondary to gallbladder from 10/8-10/12. Patient was readmitted on 11/7-11/13 due to severe sepsis secondary to E coli and was discharged to [facility] on Augmentin. He's been continuing to become weak with nausea and vomiting and mild abdominal pain.  Pt had accidentally dislodged cortrak tube. Instead of replacing old tube, used 55" tube and after some difficulty, was able to successfully obtain more distal TF access, past LOT. Per xray eval, tip in distal jejunum. This may allow pt to tolerate increase rate of TF.     Unfortunately, today pt does not report feeling any better. He states "Im getting weaker and weaker each day". He denies any issues with intolerance at the 20 cc/hr rate.   Current TPN formula per last pharmacy note: Clinimix at E 5/15 at 67m/hr 20% lipid emulsion at 128mhr TPN and TF provides 114 g of protein and 2164 kCals . MVI and TE in TPN 30 units of regular insulin in TPN  Labs: Albumin: 1.6, BG:  120-150 ml/dl. Liver enzymes appear improved. Alk phos slightly decreased.  WBC: 14.1 (improved)  Recent Labs Lab 05/06/16 0500  05/08/16 0415 05/09/16 0250 05/10/16 0229  NA 137  < > 137 138 135  K 3.2*  < > 3.9 4.0 3.7  CL 105  < > 107 105 105  CO2 24  < > '27 26 25  '$ BUN 23*  < > 21* 22* 20  CREATININE 0.65  < > 0.58* 0.64 0.64  CALCIUM 7.9*  < > 8.0* 8.1* 8.0*  MG 1.8  --  1.9  --  1.8  PHOS 2.1*  < > 2.4* 2.6 3.2  GLUCOSE 201*  < > 189* 117* 112*  < > = values in this interval not displayed.   Diet Order:  Diet NPO time specified Except for: Sips with Meds TPN (CLINIMIX-E) Adult TPN (CLINIMIX-E) Adult  Skin:  MASD to groin, scrotum, and sacrum, dermatitis to groin and buttocks, excoriated sacrum, rash to groin/thigh  Last BM:  11/22  Height:  Ht Readings from Last 1 Encounters:  05/02/16 '5\' 9"'$  (1.753 m)   Weight:  Wt Readings from Last 1 Encounters:  05/10/16 178 lb (80.7 kg)   Wt Readings from Last 10 Encounters:  05/10/16 178 lb (80.7 kg)  05/02/16 162 lb (73.5 kg)  04/30/16 162 lb (73.5 kg)  04/28/16 162 lb 4.1 oz (73.6 kg)  04/07/16 164 lb 3.9 oz (74.5 kg)  04/19/15 168 lb (76.2 kg)  06/21/13 166 lb 3.2  oz (75.4 kg)  05/31/13 171 lb (77.6 kg)  05/30/13 169 lb 12.8 oz (77 kg)  04/01/13 174 lb (78.9 kg)   Ideal Body Weight:  72.7 kg  BMI:  Body mass index is 26.29 kg/m.  Estimated Nutritional Needs:  Kcal:  1900-2100 Protein:  100-125 grams Fluid:  > 1.9 L/day  EDUCATION NEEDS:  No education needs identified at this time  Burtis Junes RD, LDN, [facility] Pager: [phone] 05/10/2016 3:32 PM

## 2016-05-10 NOTE — Progress Notes (Signed)
Physical Therapy Treatment Patient Details Name: Colin West N Starke MRN: [id number] DOB: August 21, 1933 Today's Date: 05/10/2016    History of Present Illness Pt readmitted with pancreatic abcessess. Pt with 2 recent admissions for hemorrhagic gallstone pancreatitis and sepsis. PMH - HTN, DM    PT Comments    Pt presented supine in bed with HOB elevated, awake and willing to get OOB to recliner. Pt's family members present throughout session. Pt requiring increased physical assistance to perform sit-to-stand transfer this session as compared to previous session. He continues to be fearful and anxious when standing or walking, as well as impulsive when wanting to sit. Pt would continue to benefit from skilled physical therapy services at this time while admitted and after d/c to address his limitations in order to improve his overall safety and independence with functional mobility.   Follow Up Recommendations  SNF     Equipment Recommendations  None recommended by PT    Recommendations for Other Services       Precautions / Restrictions Precautions Precautions: Fall Restrictions Weight Bearing Restrictions: No    Mobility  Bed Mobility Overal bed mobility: Needs Assistance Bed Mobility: Supine to Sit     Supine to sit: Min assist;HOB elevated     General bed mobility comments: min A to elevate trunk  Transfers Overall transfer level: Needs assistance Equipment used: Rolling walker (2 wheeled) Transfers: Sit to/from UGI CorporationStand;Stand Pivot Transfers Sit to Stand: Max assist Stand pivot transfers: Min assist       General transfer comment: pt required increased time, VC'ing for bilateral hand placement and max A to achieve full standing position from bed. Pt required min A with pivotal movements and for safety and he tried to prematurely sit down in recliner despite verbal cueing. Min A provided at pelvis for positioning into recliner.  Ambulation/Gait                  Stairs            Wheelchair Mobility    Modified Rankin (Stroke Patients Only)       Balance Overall balance assessment: Needs assistance;History of Falls Sitting-balance support: Feet supported;Single extremity supported Sitting balance-Leahy Scale: Poor Sitting balance - Comments: pt required at least one UE support to sit EOB Postural control: Posterior lean;Right lateral lean Standing balance support: During functional activity;Bilateral upper extremity supported Standing balance-Leahy Scale: Poor Standing balance comment: pt reliant on bilateral UEs on RW                    Cognition Arousal/Alertness: Awake/alert Behavior During Therapy: WFL for tasks assessed/performed;Flat affect Overall Cognitive Status: Within Functional Limits for tasks assessed                      Exercises      General Comments        Pertinent Vitals/Pain Pain Assessment: No/denies pain   VSS throughout.    Home Living                      Prior Function            PT Goals (current goals can now be found in the care plan section) Acute Rehab PT Goals Patient Stated Goal: get better PT Goal Formulation: With patient/family Time For Goal Achievement: 05/17/16 Potential to Achieve Goals: Good Progress towards PT goals: Progressing toward goals    Frequency    Min 3X/week  PT Plan Current plan remains appropriate    Co-evaluation             End of Session Equipment Utilized During Treatment: Gait belt Activity Tolerance: Patient limited by fatigue;Other (comment) (pt limited secondary to fear/anxiety) Patient left: in chair;with call bell/phone within reach;with chair alarm set;with family/visitor present     Time: [id number] PT Time Calculation (min) (ACUTE ONLY): 20 min  Charges:  $Therapeutic Activity: 8-22 mins                    G CodesAlessandra Bevels:      Devan Babino M Saachi Zale 05/10/2016, 5:38 PM Deborah ChalkJennifer Janisa Labus, PT,  DPT [phone]

## 2016-05-10 NOTE — Progress Notes (Signed)
Subjective: No complaints No abdominal pain currently CT scan - persistent pancreatic abscesses - no significant change   Objective: Vital signs in last 24 hours: Temp:  [97.9 F (36.6 C)] 97.9 F (36.6 C) (11/24 1000) Pulse Rate:  [78-89] 88 (11/24 2216) Resp:  [20] 20 (11/24 1000) BP: (129-139)/(62-74) 129/62 (11/24 2216) SpO2:  [97 %] 97 % (11/24 1000) Weight:  [80.7 kg (178 lb)] 80.7 kg (178 lb) (11/25 0500) Last BM Date: 05/07/16  Intake/Output from previous day: 11/24 0701 - 11/25 0700 In: 741.3 [I.V.:491.3; IV Piggyback:250] Out: 475 [Urine:475] Intake/Output this shift: No intake/output data recorded.  General appearance: alert, cooperative and no distress Resp: clear to auscultation bilaterally Cardio: regular rate and rhythm, S1, S2 normal, no murmur, click, rub or gallop GI: mildly distended; non-tender; + BS  Lab Results:   Recent Labs  05/08/16 0415 05/10/16 0229  WBC 16.3* 14.1*  HGB 11.3* 10.8*  HCT 34.8* 32.2*  PLT 190 171   BMET  Recent Labs  05/09/16 0250 05/10/16 0229  NA 138 135  K 4.0 3.7  CL 105 105  CO2 26 25  GLUCOSE 117* 112*  BUN 22* 20  CREATININE 0.64 0.64  CALCIUM 8.1* 8.0*   PT/INR No results for input(s): LABPROT, INR in the last 72 hours. ABG No results for input(s): PHART, HCO3 in the last 72 hours.  Invalid input(s): PCO2, PO2  Studies/Results: Dg Chest 1 View  Result Date: 05/08/2016 CLINICAL DATA:  Coughing episode EXAM: CHEST 1 VIEW COMPARISON:  05/03/2016 FINDINGS: Semi-erect portable view chest. A right upper extremity catheter tip overlies the cavoatrial junction. Esophageal tube tip is not included but probably positioned over duodenum. Markedly low lung volumes. Probable tiny effusions. Mild bibasilar atelectasis. No focal consolidations. Stable cardiomediastinal silhouette. No pneumothorax. Postsurgical changes in the right lung apex. Old right-sided rib deformities. IMPRESSION: 1. Markedly low lung  volumes with suspected tiny effusions. 2. Mild bibasilar atelectasis. Electronically Signed   By: Jasmine PangKim  Fujinaga M.D.   On: 05/08/2016 16:33   Dg Abd 1 View  Result Date: 05/08/2016 CLINICAL DATA:  NG tube placement EXAM: ABDOMEN - 1 VIEW COMPARISON:  05/06/2016 FINDINGS: Motion artifact limits the examination. Esophageal tube tip overlies the second portion of the duodenum. Some gaseous dilatation of bowel could relate to a mild ileus. IMPRESSION: Esophageal tube tip overlies the second portion of the duodenum. Mild gaseous dilatation of bowel could relate to a mild ileus. Electronically Signed   By: Jasmine PangKim  Fujinaga M.D.   On: 05/08/2016 16:35   Ct Abdomen Pelvis W Contrast  Result Date: 05/09/2016 CLINICAL DATA:  Hemorrhagic pancreatitis, nausea, vomiting. EXAM: CT ABDOMEN AND PELVIS WITH CONTRAST TECHNIQUE: Multidetector CT imaging of the abdomen and pelvis was performed using the standard protocol following bolus administration of intravenous contrast. CONTRAST:  100mL ISOVUE-300 IOPAMIDOL (ISOVUE-300) INJECTION 61% COMPARISON:  CT scan of May 02, 2016. FINDINGS: Lower chest: Mild left pleural effusion is noted with adjacent subsegmental atelectasis. Hepatobiliary: No gallstones are noted. Mild amount of fluid is noted around the liver. No focal hepatic lesion is noted. Pancreas: There is again noted multifocal abscesses within the retroperitoneum surrounding atretic pancreas. Increased amount of gas is present within the larger loculations. Overall, does not appear to be significantly changed in size. Spleen: Normal. Adrenals/Urinary Tract: Right adrenal gland appears normal. Stable left adrenal gland enlargement is noted. Right nephrolithiasis is noted. No hydronephrosis or renal obstruction is noted. Urinary bladder appears normal. Stomach/Bowel: Feeding tube is seen with distal tip  in third portion of duodenum. There is no evidence of bowel obstruction. Normal appendix. Diverticulosis of  descending and sigmoid colon is noted without inflammation. Vascular/Lymphatic: Atherosclerosis of abdominal aorta is noted without aneurysm formation. Celiac, superior mesenteric and and inferior mesenteric arteries are patent. Reproductive: Stable mild prostatic enlargement with calcifications is noted. Other: Moderate anasarca is noted. Musculoskeletal: Severe multifocal degenerative disc disease is noted. IMPRESSION: Mild left pleural effusion with adjacent subsegmental atelectasis. Aortic atherosclerosis. Feeding tube seen with distal tip in third portion of duodenum. Diverticulosis of descending and sigmoid colon is noted without inflammation. Stable mild prostatic enlargement. Moderate anasarca. Continued presence of multifocal abscesses within the retroperitoneal surrounding atretic pancreas, which does not appear to be significantly is changed in size, although there is an increased amount of gas present within the loculations consistent with infection. Electronically Signed   By: Lupita RaiderJames  Green Jr, M.D.   On: 05/09/2016 17:15    Anti-infectives: Anti-infectives    Start     Dose/Rate Route Frequency Ordered Stop   05/09/16 1200  cefTRIAXone (ROCEPHIN) 2 g in dextrose 5 % 50 mL IVPB  Status:  Discontinued     2 g 100 mL/hr over 30 Minutes Intravenous Every 24 hours 05/09/16 0801 05/09/16 1050   05/09/16 1200  cefTRIAXone (ROCEPHIN) 1 g in dextrose 5 % 50 mL IVPB     1 g 100 mL/hr over 30 Minutes Intravenous Every 24 hours 05/09/16 1050     05/09/16 1100  vancomycin (VANCOCIN) IVPB 750 mg/150 ml premix     750 mg 150 mL/hr over 60 Minutes Intravenous Every 12 hours 05/09/16 1051     05/05/16 1230  fluconazole (DIFLUCAN) IVPB 100 mg     100 mg 50 mL/hr over 60 Minutes Intravenous Every 24 hours 05/05/16 1135     05/03/16 1200  imipenem-cilastatin (PRIMAXIN) 500 mg in sodium chloride 0.9 % 100 mL IVPB  Status:  Discontinued     500 mg 200 mL/hr over 30 Minutes Intravenous Every 6 hours  05/03/16 1122 05/09/16 0801   05/03/16 0030  piperacillin-tazobactam (ZOSYN) IVPB 3.375 g  Status:  Discontinued     3.375 g 12.5 mL/hr over 240 Minutes Intravenous Every 8 hours 05/02/16 1647 05/03/16 1101   05/02/16 1930  fluconazole (DIFLUCAN) tablet 100 mg  Status:  Discontinued     100 mg Oral Daily 05/02/16 1816 05/05/16 1135   05/02/16 1600  piperacillin-tazobactam (ZOSYN) IVPB 3.375 g     3.375 g 100 mL/hr over 30 Minutes Intravenous  Once 05/02/16 1548 05/02/16 1720      Assessment/Plan: Gallstone pancreatitis Pancreatic abscess Severe protein calorie malnutrition E coli pyelonephritis weakness Sacral ulcer CHF  The CT scan yesterday confirms the plan from yesterday.  There is no sign of cholecystitis or even any sign of cholelithiasis.  Laparoscopic cholecystectomy at this time will do nothing to improve his pancreatitis/ pancreatic abscesses.  Continue antibiotics.  No sign of bowel obstruction, so will restart tube feeds.  Explained to patient  -Con't TNA, Tol trickle TFs - will restart at 20 ml/hr.  Continue TNA -Will need gallbladder out likely in an interval fashion as pt severely malnutritioned.  -Will need improved nutrition, improvement/resolution of pancreatitis -Cardiology - "moderate risk if laparoscopic cholecystectomy/ high risk if open procedure" -Cultures= E.coli. Would rec con't primaxin Continue primaxin/fluconazole. Will follow.    LOS: 8 days    Jedadiah Abdallah K. 05/10/2016

## 2016-05-10 NOTE — Progress Notes (Signed)
PHARMACY - ADULT TOTAL PARENTERAL NUTRITION CONSULT NOTE   Pharmacy Consult for TPN Indication: Intolerance of enteral feeding  Patient Measurements: Height: _0  (175.3 cm) Weight: 160 lb 11.5 oz (72.9 kg) IBW/kg (Calculated) : 70.7 TPN AdjBW (KG): 72.9 Body mass index is 23.73 kg/m.   Assessment:  82yo male admitted 10/8-10/12 with hemorrhagic pancreatitis 2nd GB, readmitted 11/7-11/13 with severe EColi sepsis, and readmitted 11/17 with weakness, N/V, and abdominal pain.  CT(+) intra-abdominal abscesses, WBC 26.  He has probable infected pancreatitic necrosis & will likely require surgery. Pt has lost 20lb in the last 2 months, severe malnutrition per RD assessment. Tube feeds were planned to start but patient with significant nausea and bilious emesis on 11/19 so pharmacy consulted to start TPN.   GI: Gallstone pancreatitis, surgery likely needed. Patient is severely malnourished. Possibly will go home on TF & come back for surgery. TFs have been on hold since 11/23 but now to restart today. No abdominal pain currently. No nausea or vomiting. Albumin low at 1.6, prealbumin < 5. Creon, Protonix IV BID Endo:  CBGs better controlled yesterday after new TPN started (110-140s) on SSI and 30 units in TPN. Insulin requirements in the past 24 hours: 30 units in TPN bag and 13 units of SSI Lytes: wnl. CoCa 9.9. Phos ok at 3.2, Mg ok at 1.8 Renal: SCr stable, CrCl ~78m/min. UOP low at 0.218mkg/hr.  NS 3548mr.  Pulm:  RA Cards:  Carotid artery stenosis, HTN, HLD.  BP a bit up. Metoprolol Hepatobil:  LFTs wnl exc AST and Alk Phos now elevated, Tbili ok at 0.4. TG wnl at 78. Neuro:  Depression, anxiety. ID:  On imipenem for E. Coli abscess and covering for possible nec pancreatitis. Afebrile, WBC elevated at 16.3  Best Practices: mouth care TPN Access:  PICC 11/18 TPN start date:  11/20  Nutritional Goals (per RD recommendation on 11/21): KCal: 1900-2100 Protein: 100-125 g  Current  Nutrition:  NPO Clinimix E 5/15 + IV lipid emulsions Vital AF 1.2 currently on hold  Plan:  Continue Clinimix at E 5/15 at 60m66m Continue 20% lipid emulsion at 10ml48mRestart Vital AF 1.2 at 20ml/62mer MD (36g of protein and 576 kcal) Continue NS at 10ml/h13mN and TF provides 114 g of protein and 2164 kCals per day meeting 100% of protein and kCal needs. Add MVI and TE in TPN Continue 30 units of regular insulin in TPN Continue moderate SSI and adjust as needed Monitor TPN labs, signs of refeeding symptoms F/U ability to tolerate TFs  Carollynn Pennywell Elenor QuinonesD, BCPS ClSsm St[facility] Pharmacist Pager [phone] 7:41 AM

## 2016-05-11 LAB — GLUCOSE, CAPILLARY
GLUCOSE-CAPILLARY: 129 mg/dL — AB (ref 65–99)
GLUCOSE-CAPILLARY: 155 mg/dL — AB (ref 65–99)
GLUCOSE-CAPILLARY: 160 mg/dL — AB (ref 65–99)
GLUCOSE-CAPILLARY: 167 mg/dL — AB (ref 65–99)
GLUCOSE-CAPILLARY: 179 mg/dL — AB (ref 65–99)
Glucose-Capillary: 119 mg/dL — ABNORMAL HIGH (ref 65–99)

## 2016-05-11 LAB — VANCOMYCIN, TROUGH: Vancomycin Tr: 13 ug/mL — ABNORMAL LOW (ref 15–20)

## 2016-05-11 LAB — AMMONIA: AMMONIA: 19 umol/L (ref 9–35)

## 2016-05-11 MED ORDER — TRACE MINERALS CR-CU-MN-SE-ZN 10-1000-500-60 MCG/ML IV SOLN
INTRAVENOUS | Status: AC
  Administered 2016-05-11: 18:00:00 via INTRAVENOUS
  Filled 2016-05-11: qty 960

## 2016-05-11 MED ORDER — JEVITY 1.2 CAL PO LIQD
1000.0000 mL | ORAL | Status: DC
  Administered 2016-05-12 – 2016-05-14 (×2): 1000 mL
  Filled 2016-05-11 (×5): qty 1000

## 2016-05-11 MED ORDER — VANCOMYCIN HCL 500 MG IV SOLR
500.0000 mg | INTRAVENOUS | Status: AC
  Administered 2016-05-11: 500 mg via INTRAVENOUS
  Filled 2016-05-11: qty 500

## 2016-05-11 MED ORDER — FAT EMULSION 20 % IV EMUL
240.0000 mL | INTRAVENOUS | Status: AC
  Administered 2016-05-11: 240 mL via INTRAVENOUS
  Filled 2016-05-11: qty 250

## 2016-05-11 MED ORDER — VANCOMYCIN HCL IN DEXTROSE 1-5 GM/200ML-% IV SOLN
1000.0000 mg | Freq: Two times a day (BID) | INTRAVENOUS | Status: DC
  Administered 2016-05-12 – 2016-05-15 (×8): 1000 mg via INTRAVENOUS
  Filled 2016-05-11 (×9): qty 200

## 2016-05-11 NOTE — Progress Notes (Signed)
Physical Therapy Treatment Patient Details Name: Colin West N [place] MRN: [id number] DOB: 1933/06/29 Today's Date: 05/11/2016    History of Present Illness Pt readmitted with pancreatic abcessess. Pt with 2 recent admissions for hemorrhagic gallstone pancreatitis and sepsis. PMH - HTN, DM    PT Comments    Pt with slow progress. Able to amb in the hall today which is good improvement since last visit.  Follow Up Recommendations  SNF     Equipment Recommendations  None recommended by PT    Recommendations for Other Services       Precautions / Restrictions Precautions Precautions: Fall Restrictions Weight Bearing Restrictions: No    Mobility  Bed Mobility Overal bed mobility: Needs Assistance Bed Mobility: Supine to Sit     Supine to sit: HOB elevated;Min assist     General bed mobility comments: Assist to elevate trunk into sitting  Transfers Overall transfer level: Needs assistance Equipment used: Rolling walker (2 wheeled) Transfers: Sit to/from Stand Sit to Stand: +2 physical assistance;Mod assist         General transfer comment: Assist to bring hips up and for balance  Ambulation/Gait Ambulation/Gait assistance: Min assist;+2 safety/equipment Ambulation Distance (Feet): 55 Feet Assistive device: Rolling walker (2 wheeled) Gait Pattern/deviations: Step-through pattern;Decreased step length - right;Shuffle;Drifts right/left;Trunk flexed Gait velocity: decr Gait velocity interpretation: <1.8 ft/sec, indicative of risk for recurrent falls General Gait Details: Assist for balance and support. Followed closely by second person with a chair. Verbal cues to stay closer to the walker and stand more erect.   Stairs            Wheelchair Mobility    Modified Rankin (Stroke Patients Only)       Balance Overall balance assessment: Needs assistance Sitting-balance support: Single extremity supported;Feet supported Sitting balance-Leahy Scale:  Poor Sitting balance - Comments: pt required at least one UE support to sit EOB   Standing balance support: Bilateral upper extremity supported Standing balance-Leahy Scale: Poor Standing balance comment: walker and min A for static standing                    Cognition Arousal/Alertness: Awake/alert Behavior During Therapy: WFL for tasks assessed/performed;Flat affect Overall Cognitive Status: Within Functional Limits for tasks assessed                      Exercises      General Comments        Pertinent Vitals/Pain Pain Assessment: No/denies pain    Home Living                      Prior Function            PT Goals (current goals can now be found in the care plan section) Progress towards PT goals: Progressing toward goals    Frequency    Min 3X/week      PT Plan Current plan remains appropriate    Co-evaluation             End of Session Equipment Utilized During Treatment: Gait belt Activity Tolerance: Patient tolerated treatment well Patient left: in chair;with call bell/phone within reach;with chair alarm set     Time: [id number] PT Time Calculation (min) (ACUTE ONLY): 18 min  Charges:  $Gait Training: 8-22 mins                    G Codes:      Angelina OkCary W [facility] HospitalMaycok  05/11/2016, 10:54 AM Skip Mayerary Niza Soderholm PT [phone]

## 2016-05-11 NOTE — Plan of Care (Signed)
Problem: Activity: Goal: Risk for activity intolerance will decrease Outcome: Progressing Patient worked with PT today and walked in room and down to nurses station. Ambulated about 100 feet and then sat up in chair for an hour. Patient encouraged to participate in activity and reminded that it will help him get stronger.   Problem: Nutrition: Goal: Adequate nutrition will be maintained Outcome: Progressing Patient tube feeds increased to 30 ml/hr and TPN reduced. Patient tolerating.   Problem: Bowel/Gastric: Goal: Will not experience complications related to bowel motility Outcome: Progressing Patient has not had a bowel movement; however, he is passing gas and bowel sounds are improving.

## 2016-05-11 NOTE — Progress Notes (Signed)
Subjective: Patient is resting comfortably - no complaints Tolerating tube feeds at 20 ml/ hr with no increase in abdominal distention, abdominal pain, or nausea No BM yesterday  Objective: Vital signs in last 24 hours: Temp:  [97.5 F (36.4 C)-98.6 F (37 C)] 97.8 F (36.6 C) (11/26 0759) Pulse Rate:  [74-127] 83 (11/26 0759) Resp:  [16-26] 17 (11/26 0759) BP: (104-154)/(64-90) 146/68 (11/26 0759) SpO2:  [96 %-99 %] 98 % (11/26 0759) Weight:  [79.8 kg (176 lb)] 79.8 kg (176 lb) (11/26 0419) Last BM Date: 05/07/16  Intake/Output from previous day: 11/25 0701 - 11/26 0700 In: 3330.3 [I.V.:2054.3; NG/GT:526; IV Piggyback:750] Out: 900 [Urine:900] Intake/Output this shift: No intake/output data recorded.  General appearance: alert, cooperative and no distress Resp: clear to auscultation bilaterally Cardio: regular rate and rhythm, S1, S2 normal, no murmur, click, rub or gallop GI: minimally distended; + bs; soft, non-tender No sign of jaundice  Lab Results:   Recent Labs  05/10/16 0229  WBC 14.1*  HGB 10.8*  HCT 32.2*  PLT 171   BMET  Recent Labs  05/09/16 0250 05/10/16 0229  NA 138 135  K 4.0 3.7  CL 105 105  CO2 26 25  GLUCOSE 117* 112*  BUN 22* 20  CREATININE 0.64 0.64  CALCIUM 8.1* 8.0*   PT/INR No results for input(s): LABPROT, INR in the last 72 hours. ABG No results for input(s): PHART, HCO3 in the last 72 hours.  Invalid input(s): PCO2, PO2  Studies/Results: Ct Abdomen Pelvis W Contrast  Result Date: 05/09/2016 CLINICAL DATA:  Hemorrhagic pancreatitis, nausea, vomiting. EXAM: CT ABDOMEN AND PELVIS WITH CONTRAST TECHNIQUE: Multidetector CT imaging of the abdomen and pelvis was performed using the standard protocol following bolus administration of intravenous contrast. CONTRAST:  100mL ISOVUE-300 IOPAMIDOL (ISOVUE-300) INJECTION 61% COMPARISON:  CT scan of May 02, 2016. FINDINGS: Lower chest: Mild left pleural effusion is noted with  adjacent subsegmental atelectasis. Hepatobiliary: No gallstones are noted. Mild amount of fluid is noted around the liver. No focal hepatic lesion is noted. Pancreas: There is again noted multifocal abscesses within the retroperitoneum surrounding atretic pancreas. Increased amount of gas is present within the larger loculations. Overall, does not appear to be significantly changed in size. Spleen: Normal. Adrenals/Urinary Tract: Right adrenal gland appears normal. Stable left adrenal gland enlargement is noted. Right nephrolithiasis is noted. No hydronephrosis or renal obstruction is noted. Urinary bladder appears normal. Stomach/Bowel: Feeding tube is seen with distal tip in third portion of duodenum. There is no evidence of bowel obstruction. Normal appendix. Diverticulosis of descending and sigmoid colon is noted without inflammation. Vascular/Lymphatic: Atherosclerosis of abdominal aorta is noted without aneurysm formation. Celiac, superior mesenteric and and inferior mesenteric arteries are patent. Reproductive: Stable mild prostatic enlargement with calcifications is noted. Other: Moderate anasarca is noted. Musculoskeletal: Severe multifocal degenerative disc disease is noted. IMPRESSION: Mild left pleural effusion with adjacent subsegmental atelectasis. Aortic atherosclerosis. Feeding tube seen with distal tip in third portion of duodenum. Diverticulosis of descending and sigmoid colon is noted without inflammation. Stable mild prostatic enlargement. Moderate anasarca. Continued presence of multifocal abscesses within the retroperitoneal surrounding atretic pancreas, which does not appear to be significantly is changed in size, although there is an increased amount of gas present within the loculations consistent with infection. Electronically Signed   By: Lupita RaiderJames  Green Jr, M.D.   On: 05/09/2016 17:15   Dg Abd Portable 1v  Result Date: 05/10/2016 CLINICAL DATA:  NG feeding tube placement EXAM: PORTABLE  ABDOMEN -  1 VIEW COMPARISON:  None. FINDINGS: Mild gaseous distended small bowel loops in mid abdomen probable mild ileus. There is NG feeding tube with tip in proximal jejunum. IMPRESSION: NG feeding tube with tip in proximal jejunum. Electronically Signed   By: Natasha MeadLiviu  Pop M.D.   On: 05/10/2016 13:28    Anti-infectives: Anti-infectives    Start     Dose/Rate Route Frequency Ordered Stop   05/10/16 1200  imipenem-cilastatin (PRIMAXIN) 500 mg in sodium chloride 0.9 % 100 mL IVPB     500 mg 200 mL/hr over 30 Minutes Intravenous Every 6 hours 05/10/16 0846     05/09/16 1200  cefTRIAXone (ROCEPHIN) 2 g in dextrose 5 % 50 mL IVPB  Status:  Discontinued     2 g 100 mL/hr over 30 Minutes Intravenous Every 24 hours 05/09/16 0801 05/09/16 1050   05/09/16 1200  cefTRIAXone (ROCEPHIN) 1 g in dextrose 5 % 50 mL IVPB  Status:  Discontinued     1 g 100 mL/hr over 30 Minutes Intravenous Every 24 hours 05/09/16 1050 05/10/16 0833   05/09/16 1100  vancomycin (VANCOCIN) IVPB 750 mg/150 ml premix     750 mg 150 mL/hr over 60 Minutes Intravenous Every 12 hours 05/09/16 1051     05/05/16 1230  fluconazole (DIFLUCAN) IVPB 100 mg     100 mg 50 mL/hr over 60 Minutes Intravenous Every 24 hours 05/05/16 1135     05/03/16 1200  imipenem-cilastatin (PRIMAXIN) 500 mg in sodium chloride 0.9 % 100 mL IVPB  Status:  Discontinued     500 mg 200 mL/hr over 30 Minutes Intravenous Every 6 hours 05/03/16 1122 05/09/16 0801   05/03/16 0030  piperacillin-tazobactam (ZOSYN) IVPB 3.375 g  Status:  Discontinued     3.375 g 12.5 mL/hr over 240 Minutes Intravenous Every 8 hours 05/02/16 1647 05/03/16 1101   05/02/16 1930  fluconazole (DIFLUCAN) tablet 100 mg  Status:  Discontinued     100 mg Oral Daily 05/02/16 1816 05/05/16 1135   05/02/16 1600  piperacillin-tazobactam (ZOSYN) IVPB 3.375 g     3.375 g 100 mL/hr over 30 Minutes Intravenous  Once 05/02/16 1548 05/02/16 1720      Assessment/Plan: Gallstone  pancreatitis Pancreatic abscess Severe protein calorie malnutrition E coli pyelonephritis weakness Sacral ulcer CHF  The CT shows no sign of cholecystitis or even any sign of cholelithiasis.  Laparoscopic cholecystectomy at this time will do nothing to improve his pancreatitis/ pancreatic abscesses.  Continue antibiotics.  No sign of bowel obstruction, so will restart tube feeds.  Explained to patient  -Con't TNA, Tol trickle TFs - will restart at 30 ml/hr. It is not clear what the goal rate will be.Continue TNA -Will need gallbladder out likely in an interval fashion as pt severely malnutritioned.  -Will need improved nutrition, improvement/resolution of pancreatitis -Cardiology - "moderate risk if laparoscopic cholecystectomy/ high risk if open procedure" -Cultures= E.coli. Would rec con't primaxin Continue primaxin/fluconazole. Will follow.   LOS: 9 days    Rayson Rando K. 05/11/2016

## 2016-05-11 NOTE — Progress Notes (Signed)
Pharmacy Antibiotic Note  Colin West is a 82 y.o. male admitted on 05/02/2016 with pancreatic abscesses and possible PNA.  Pharmacy was consulted for Primaxin dosing.  Abscess culture grew E.coli.   WBC down to 14.1. Patient is afebrile. Kidney function is stable with CrCl ~ 70 ml/min. PLTC 190>171 Vancomycin trough = 13 mcg/ml on 750mg  IV q12h , trough drawn ~1 hour early and trough is below goal 15-20 mcg/ml  WBC down to 14.1. Patient is afebrile. Kidney function is stable with SCr 0.64 ,CrCl ~ 70 ml/min. I/O [phone]/950 + urine occurrence 2x . No new cultures.    Plan: Give extra 500 mg vancomycin IV x1 now then increase Vancomycin to 1000 mg IV q12h  Continue Imipenem/cilastatin 500 mg every 6 hours Monitor renal function Monitor clinical s/sx of infection, LOT  Watch pltc on primaxin.  Height: 5\' 9"  (175.3 cm) Weight: 176 lb (79.8 kg) IBW/kg (Calculated) : 70.7  Temp (24hrs), Avg:97.8 F (36.6 C), Min:97.5 F (36.4 C), Max:98.6 F (37 C)   Recent Labs Lab 05/05/16 0545 05/06/16 0500 05/07/16 0425 05/08/16 0415 05/08/16 1700 05/09/16 0250 05/10/16 0229 05/11/16 1029  WBC 18.2* 15.5*  --  16.3*  --   --  14.1*  --   CREATININE 0.86 0.65 0.51* 0.58*  --  0.64 0.64  --   LATICACIDVEN  --   --   --   --  1.3  --   --   --   VANCOTROUGH  --   --   --   --   --   --   --  13*    Estimated Creatinine Clearance: 73.6 mL/min (by C-G formula based on SCr of 0.64 mg/dL).    No Known Allergies  Antimicrobials this admission: Zosyn 11/17 >> 11/18 Fluc 11/17>>11/20 Primaxin 11/18>> 11/24, 11/25>> Ceftriaxone 11/24 >>11/25 Vancomycin 11/24>>  Dose adjustments: Vancomycin trough = 13 mcg/ml on 750mg  IV q12h--> give extra 500mg  x1 then increase dose to 1g IV q12h  Microbiology results: 11/17 BCx: neg 11/17 UCx: neg  11/18 Abscess from drain: Ecoli (S- imipenem, CTX) 11/17 MRSA PCR: neg   Thank you for allowing pharmacy to be a part of this patient's care.  Colin West  Colin West, RPh Clinical Pharmacist Pager: [phone] 05/11/2016 12:58 PM

## 2016-05-11 NOTE — Progress Notes (Signed)
PHARMACY - ADULT TOTAL PARENTERAL NUTRITION CONSULT NOTE   Pharmacy Consult for TPN Indication: Intolerance of enteral feeding  Patient Measurements: Height: _0  (175.3 cm) Weight: 160 lb 11.5 oz (72.9 kg) IBW/kg (Calculated) : 70.7 TPN AdjBW (KG): 72.9 Body mass index is 23.73 kg/m.   Assessment:  82yo male admitted 10/8-10/12 with hemorrhagic pancreatitis 2nd GB, readmitted 11/7-11/13 with severe EColi sepsis, and readmitted 11/17 with weakness, N/V, and abdominal pain.  CT(+) intra-abdominal abscesses, WBC 26.  He has probable infected pancreatitic necrosis & will likely require surgery. Pt has lost 20lb in the last 2 months, severe malnutrition per RD assessment. Tube feeds were planned to start but patient with significant nausea and bilious emesis on 11/19 so pharmacy consulted to start TPN.   GI: Gallstone pancreatitis, surgery likely needed. Patient is severely malnourished. Possibly will go home on TF & come back for surgery. TFs were on hold since 11/23 but restarted on 11/25. Currently running at 44m/hr. No abdominal pain or nausea. Surgery to increase TFs to 370mhr this morning. Albumin low at 1.6, prealbumin < 5. Creon, Protonix IV BID Endo:  CBGs better controlled (120-150s) on SSI and 30 units in TPN. Insulin requirements in the past 24 hours: 30 units in TPN bag and 14 units of SSI Lytes: wnl, K down to 3.7 yesterday. CoCa 9.8. Phos ok at 3.2, Mg ok at 1.8 Renal: SCr stable, CrCl ~7015min. UOP not all charted.  NS 68m58m.  Pulm:  RA Cards:  Carotid artery stenosis, HTN, HLD.  BP a bit up. Metoprolol Hepatobil:  LFTs wnl exc AST and Alk Phos now elevated but trending back down, Tbili ok at 0.6. TG wnl at 78. Neuro:  Depression, anxiety. ID:  On imipenem for E. Coli abscess and covering for possible nec pancreatitis. Afebrile, WBC elevated at 16.3  Best Practices: mouth care TPN Access:  PICC 11/18 TPN start date:  11/20  Nutritional Goals (per RD  recommendation on 11/25): KCal: 1900-2100 Protein: 100-125 g  Current Nutrition:  NPO Clinimix E 5/15 + IV lipid emulsions Vital AF 1.2 at 20ml33m Plan:  Decrease Clinimix to E 5/20 at 40ml/38montinue 20% lipid emulsion at 68ml/h68mcrease Vital AF 1.2 to 30ml/hr71m surgery (54g of protein and 864 kcal) Continue NS at 68ml/hr 38mand TF provides 102 g of protein and 2188 kCals per day meeting 100% of protein and kCal needs. Add MVI and TE in TPN Decrease to 20 units of regular insulin in TPN Continue moderate SSI and adjust as needed Monitor TPN labs F/U ability to tolerate TFs   Consider weaning TPN and IV lipid emulsions off on 11/27 if tolerating TFs at 30ml/hr a67mlan to increase further  Kyl Givler BatElenor QuinonesBCPS [facility] LtdPharmacist Pager [phone] 7:19 AM

## 2016-05-11 NOTE — Progress Notes (Signed)
PROGRESS NOTE  Colin West  ZOX:[id number] DOB: 02-Mar-1934 DOA: 05/02/2016 PCP: Verl BangsADIONTCHENKO, ALEXEI, MD Outpatient Specialists:  Subjective: THICK secretions , cough noted for 2 days Tube feeds held for possible aspiration    Brief Narrative:  Colin HasGraham N Goncalves is a 82 y.o. male  with complex history recently hospitalized from 10/8-10/12 for hemorrhagic pancreatitis from gallblader source, and on  11/7-11/13 for Severe Sepsis due to E.Coli pyelonephritis, discharged to  [facility] on oral antibiotics, brought to the emergency department with worsening generalized weakness, today  evere enough with  associated nausea and vomiting. He denies shortness of breath or productive cough. He has been unable to ambulate due to weakness. He denies any abdominal pain, he has occasional nausea without vomiting, he denies any diarrhea or constipation. He has not been able to eat a normal meal, lost about 15 lbs in the last 2 months.  He reports frequent hiccups. He denies any lower extremity swelling. Due to prolonged bed rest, he has developed some the ulcers in the sacrum and gluteal  areas as well as some minimal heel blanching hese is being cared at the facility as well. 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 -CT scan showed multiple peripancreatic abscesses, was recently in the hospital from 10/8-10/12 for hemorrhagic pancreatitis from gallbladder  source. -Currently on Primaxin and Diflucan for pancreatitis, vancomycin for possible HCAP, Pancreatic abscess aspiration now growing Escherichia coli  . Surgery recommends to continue Primaxin -Patient developed pancreatic necrosis while he was eating, need to be strict NPO with postpyloric feeding. Continue TPN Restarted tube feeds at a slow rate, increase rate per surgery recommendations  -As per surgery, patient's nutritional and functional status needs to improve prior to consider surgery which seems unlikely. -patient refuses to go back to SNF in the interval that he is waiting for surgery Per surgery CT shows no sign of cholecystitis or even any sign of cholelithiasis. Laparoscopic cholecystectomy at this time will do nothing to improve his pancreatitis/ pancreatic abscesses Cardiology reconsulted 11/22-if patient requires laparoscopic surgery-moderate risk, if patient requires open surgery, it would be considered high risk, at this point cannot rule out ischemia, no further cardiac testing recommended. Continue perioperative beta blocker. Proceed with surgery if indicated Repeat 2-D echo shows mild improvement in EF to 35-40%. Continue   beta blocker     Cough /probable HCAP Suspect developing  pna , will repeat chest x-ray today Continue vancomycin/imipenem Counseled RN to maintain strict aspiration precautions and pulmonary toilet Leukocytosis improving   Protein calorie malnutrition, severe  -Lost 20 pounds in the past 2 months, albumin continues to decline -Per general surgery recommendation try postpyloric enteral nutrition prior to TPN. Currently on both TPN and enteric feeding held because of ileus, now resumed -CORTRAK, in place  Atrial fibrillation, transient -Transient atrial fibrillation with heart rate 130s, back to sinus rhythm with IV/by mouth metoprolol. Continue oral metoprolol -Not sure if he needs anticoagulation for transient transient A. fib.  TSH   2.319  Type II Diabetes Current blood sugar level is 174 Currently nothing by mouth, per general surgery once into postpyloric feeding.     Hypertension BP 125/65   Pulse 86   Controlled Continue home anti-hypertensive medications    Hiccups This is likely secondary to the irritation of the diaphragm from pancreatic abscess, on Reglan.  Hyperlipidemia Continue home statins   Dilated Cardiomyopathy in view of recent sepsis 2 D echo recently shows EF 30-35 % 11/10,    Cautiously monitor fluid status   ?Dementia/encephalopathic   Minimize narcotics     DVT prophylaxis:  Code Status: Full Code Family Communication:  Discussed with son and brother by the bedside Disposition Plan: refusing SNF,  Not stable for DC   Diet: Diet NPO time specified Except for: Sips with Meds TPN (CLINIMIX-E) Adult TPN (CLINIMIX-E) Adult  Consultants:   Gen. surgery.  Interventional radiology  Cardiology  Procedures:   Aspiration of abscess on 05/04/2016  Antimicrobials:   Zosyn and vancomycin changed to Primaxin on 05/03/2016  Objective: Vitals:   05/11/16 0000 05/11/16 0419 05/11/16 0700 05/11/16 0759  BP:  128/75 128/69 (!) 146/68  Pulse:  80 81 83  Resp:  19 16 17   Temp: 97.9 F (36.6 C) 97.6 F (36.4 C)  97.8 F (36.6 C)  TempSrc: Oral Oral  Oral  SpO2:  98% 97% 98%  Weight:  79.8 kg (176 lb)    Height:        Intake/Output Summary (Last 24 hours) at 05/11/16 0906 Last data filed at 05/11/16 0700  Gross per 24 hour  Intake          3330.25 ml  Output              900 ml  Net          2430.25 ml   Filed Weights   05/09/16 0254 05/10/16 0500 05/11/16 0419  Weight: 78.9 kg (174 lb) 80.7 kg (178 lb) 79.8 kg (176 lb)    Examination: General exam: Appears calm and comfortable  Respiratory system: Clear to auscultation. Respiratory effort normal. Cardiovascular system: S1 & S2 heard, RRR. No JVD, murmurs, rubs, gallops or clicks. No pedal  edema. Gastrointestinal system: Abdomen is nondistended, soft and nontender. No organomegaly or masses felt. Normal bowel sounds heard. Central nervous system: Alert and oriented. No focal neurological deficits. Extremities: Symmetric 5 x 5 power. Skin: No rashes, lesions or ulcers Psychiatry: Judgement and insight appear normal. Mood & affect appropriate.   Data Reviewed: I have personally reviewed following labs and imaging studies  CBC:  Recent Labs Lab 05/05/16 0545 05/06/16 0500 05/08/16 0415 05/10/16 0229  WBC 18.2* 15.5* 16.3* 14.1*  NEUTROABS  --  11.8*  --   --   HGB 11.0* 11.1* 11.3* 10.8*  HCT 33.2* 34.5* 34.8* 32.2*  MCV 95.4 96.1 96.1 95.0  PLT 223 246 190 171   Basic Metabolic Panel:  Recent Labs Lab 05/06/16 0500 05/07/16 0425 05/08/16 0415 05/09/16 0250 05/10/16 0229  NA 137 137 137 138 135  K 3.2* 3.5 3.9 4.0 3.7  CL 105 106 107 105 105  CO2 24 25 27 26 25   GLUCOSE 201* 150* 189* 117* 112*  BUN 23* 18 21* 22* 20  CREATININE 0.65 0.51* 0.58* 0.64 0.64  CALCIUM 7.9* 8.0* 8.0* 8.1* 8.0*  MG 1.8  --  1.9  --  1.8  PHOS 2.1* 2.8 2.4* 2.6 3.2   GFR: Estimated Creatinine Clearance: 73.6 mL/min (by C-G formula based  on SCr of 0.64 mg/dL). Liver Function Tests:  Recent Labs Lab 05/05/16 0545 05/06/16 0500 05/08/16 0415 05/10/16 0229  AST 24 30 155* 53*  ALT 17 18 53 43  ALKPHOS 73 91 317* 257*  BILITOT 1.4* 0.6 0.4 0.6  PROT 4.9* 4.7* 4.9* 4.9*  ALBUMIN 1.7* 1.7* 1.6* 1.6*    Recent Labs Lab 05/10/16 0229  LIPASE 18    Recent Labs Lab 05/11/16 0223  AMMONIA 19   Coagulation Profile: No results for input(s): INR, PROTIME in the last 168 hours. Cardiac Enzymes: No results for input(s): CKTOTAL, CKMB, CKMBINDEX, TROPONINI in the last 168 hours. BNP (last 3 results) No results for input(s): PROBNP in the last 8760 hours. HbA1C: No results for input(s): HGBA1C in the last 72 hours. CBG:  Recent Labs Lab 05/10/16 1604  05/10/16 1958 05/11/16 0002 05/11/16 0418 05/11/16 0757  GLUCAP 137* 133* 140* 155* 129*   Lipid Profile: No results for input(s): CHOL, HDL, LDLCALC, TRIG, CHOLHDL, LDLDIRECT in the last 72 hours. Thyroid Function Tests: No results for input(s): TSH, T4TOTAL, FREET4, T3FREE, THYROIDAB in the last 72 hours. Anemia Panel: No results for input(s): VITAMINB12, FOLATE, FERRITIN, TIBC, IRON, RETICCTPCT in the last 72 hours. Urine analysis:    Component Value Date/Time   COLORURINE AMBER (A) 05/02/2016 1149   APPEARANCEUR CLOUDY (A) 05/02/2016 1149   LABSPEC 1.024 05/02/2016 1149   PHURINE 6.0 05/02/2016 1149   GLUCOSEU NEGATIVE 05/02/2016 1149   HGBUR NEGATIVE 05/02/2016 1149   BILIRUBINUR SMALL (A) 05/02/2016 1149   KETONESUR NEGATIVE 05/02/2016 1149   PROTEINUR NEGATIVE 05/02/2016 1149   UROBILINOGEN 0.2 03/08/2010 1026   NITRITE NEGATIVE 05/02/2016 1149   LEUKOCYTESUR SMALL (A) 05/02/2016 1149   Sepsis Labs: @LABRCNTIP (procalcitonin:4,lacticidven:4)  ) Recent Results (from the past 240 hour(s))  Culture, blood (Routine x 2)     Status: None   Collection Time: 05/02/16 11:45 AM  Result Value Ref Range Status   Specimen Description BLOOD LEFT ANTECUBITAL  Final   Special Requests BOTTLES DRAWN AEROBIC AND ANAEROBIC 5CC  Final   Culture NO GROWTH 5 DAYS  Final   Report Status 05/07/2016 FINAL  Final  Urine culture     Status: None   Collection Time: 05/02/16 11:49 AM  Result Value Ref Range Status   Specimen Description URINE, CLEAN CATCH  Final   Special Requests NONE  Final   Culture NO GROWTH  Final   Report Status 05/03/2016 FINAL  Final  Culture, blood (Routine x 2)     Status: None   Collection Time: 05/02/16 12:40 PM  Result Value Ref Range Status   Specimen Description BLOOD RIGHT ANTECUBITAL  Final   Special Requests BOTTLES DRAWN AEROBIC AND ANAEROBIC  Final   Culture NO GROWTH 5 DAYS  Final   Report Status 05/07/2016 FINAL  Final  MRSA PCR Screening      Status: None   Collection Time: 05/02/16  6:32 PM  Result Value Ref Range Status   MRSA by PCR NEGATIVE NEGATIVE Final    Comment:        The GeneXpert MRSA Assay (FDA approved for NASAL specimens only), is one component of a comprehensive MRSA colonization surveillance program. It is not intended to diagnose MRSA infection nor to guide or monitor treatment for MRSA infections.   Aerobic/Anaerobic Culture (surgical/deep wound)     Status: None   Collection Time: 05/03/16  2:10 PM  Result Value Ref Range Status   Specimen Description DRAINAGE  Final  Special Requests Normal  Final   Gram Stain   Final    ABUNDANT WBC PRESENT, PREDOMINANTLY PMN MODERATE GRAM NEGATIVE RODS    Culture MODERATE ESCHERICHIA COLI NO ANAEROBES ISOLATED   Final   Report Status 05/08/2016 FINAL  Final   Organism ID, Bacteria ESCHERICHIA COLI  Final      Susceptibility   Escherichia coli - MIC*    AMPICILLIN >=32 RESISTANT Resistant     CEFAZOLIN <=4 SENSITIVE Sensitive     CEFEPIME <=1 SENSITIVE Sensitive     CEFTAZIDIME <=1 SENSITIVE Sensitive     CEFTRIAXONE <=1 SENSITIVE Sensitive     CIPROFLOXACIN >=4 RESISTANT Resistant     GENTAMICIN <=1 SENSITIVE Sensitive     IMIPENEM <=0.25 SENSITIVE Sensitive     TRIMETH/SULFA >=320 RESISTANT Resistant     AMPICILLIN/SULBACTAM >=32 RESISTANT Resistant     PIP/TAZO <=4 SENSITIVE Sensitive     Extended ESBL NEGATIVE Sensitive     * MODERATE ESCHERICHIA COLI     Invalid input(s): PROCALCITONIN, LACTICACIDVEN   Radiology Studies: Ct Abdomen Pelvis W Contrast  Result Date: 05/09/2016 CLINICAL DATA:  Hemorrhagic pancreatitis, nausea, vomiting. EXAM: CT ABDOMEN AND PELVIS WITH CONTRAST TECHNIQUE: Multidetector CT imaging of the abdomen and pelvis was performed using the standard protocol following bolus administration of intravenous contrast. CONTRAST:  100mL ISOVUE-300 IOPAMIDOL (ISOVUE-300) INJECTION 61% COMPARISON:  CT scan of May 02, 2016.  FINDINGS: Lower chest: Mild left pleural effusion is noted with adjacent subsegmental atelectasis. Hepatobiliary: No gallstones are noted. Mild amount of fluid is noted around the liver. No focal hepatic lesion is noted. Pancreas: There is again noted multifocal abscesses within the retroperitoneum surrounding atretic pancreas. Increased amount of gas is present within the larger loculations. Overall, does not appear to be significantly changed in size. Spleen: Normal. Adrenals/Urinary Tract: Right adrenal gland appears normal. Stable left adrenal gland enlargement is noted. Right nephrolithiasis is noted. No hydronephrosis or renal obstruction is noted. Urinary bladder appears normal. Stomach/Bowel: Feeding tube is seen with distal tip in third portion of duodenum. There is no evidence of bowel obstruction. Normal appendix. Diverticulosis of descending and sigmoid colon is noted without inflammation. Vascular/Lymphatic: Atherosclerosis of abdominal aorta is noted without aneurysm formation. Celiac, superior mesenteric and and inferior mesenteric arteries are patent. Reproductive: Stable mild prostatic enlargement with calcifications is noted. Other: Moderate anasarca is noted. Musculoskeletal: Severe multifocal degenerative disc disease is noted. IMPRESSION: Mild left pleural effusion with adjacent subsegmental atelectasis. Aortic atherosclerosis. Feeding tube seen with distal tip in third portion of duodenum. Diverticulosis of descending and sigmoid colon is noted without inflammation. Stable mild prostatic enlargement. Moderate anasarca. Continued presence of multifocal abscesses within the retroperitoneal surrounding atretic pancreas, which does not appear to be significantly is changed in size, although there is an increased amount of gas present within the loculations consistent with infection. Electronically Signed   By: Lupita RaiderJames  Green Jr, M.D.   On: 05/09/2016 17:15   Dg Abd Portable 1v  Result Date:  05/10/2016 CLINICAL DATA:  NG feeding tube placement EXAM: PORTABLE ABDOMEN - 1 VIEW COMPARISON:  None. FINDINGS: Mild gaseous distended small bowel loops in mid abdomen probable mild ileus. There is NG feeding tube with tip in proximal jejunum. IMPRESSION: NG feeding tube with tip in proximal jejunum. Electronically Signed   By: Natasha MeadLiviu  Pop M.D.   On: 05/10/2016 13:28        Scheduled Meds: . atorvastatin  10 mg Oral q1800  . chlorhexidine  15 mL Mouth Rinse  BID  . fluconazole (DIFLUCAN) IV  100 mg Intravenous Q24H  . guaiFENesin-dextromethorphan  5 mL Oral Q8H  . imipenem-cilastatin  500 mg Intravenous Q6H  . insulin aspart  0-15 Units Subcutaneous Q4H  . lipase/protease/amylase  36,000 Units Oral TID AC  . mouth rinse  15 mL Mouth Rinse q12n4p  . metoprolol  100 mg Oral BID  . pantoprazole (PROTONIX) IV  40 mg Intravenous Q12H  . sodium chloride flush  10-40 mL Intracatheter Q12H  . sodium chloride flush  3 mL Intravenous Q12H  . vancomycin  750 mg Intravenous Q12H   Continuous Infusions: . sodium chloride 10 mL/hr at 05/09/16 1039  . diltiazem (CARDIZEM) infusion    . Marland KitchenTPN (CLINIMIX-E) Adult 65 mL/hr at 05/10/16 1801   And  . fat emulsion 240 mL (05/10/16 1801)  . Marland KitchenTPN (CLINIMIX-E) Adult     And  . fat emulsion    . feeding supplement (JEVITY 1.2 CAL) 1,000 mL (05/11/16 0853)  . feeding supplement (VITAL AF 1.2 CAL) Stopped (05/08/16 1430)     LOS: 9 days    Time spent: 35 minutes    Sophina Mitten, MD [facility] Pager [phone]  If 7PM-7AM, please contact night-coverage [email or web address] Password [email or web address] 05/11/2016, 9:06 AM

## 2016-05-12 ENCOUNTER — Inpatient Hospital Stay (HOSPITAL_COMMUNITY): Payer: Medicare Other

## 2016-05-12 LAB — DIFFERENTIAL
Basophils Absolute: 0 10*3/uL (ref 0.0–0.1)
Basophils Relative: 0 %
EOS ABS: 0.6 10*3/uL (ref 0.0–0.7)
Eosinophils Relative: 4 %
LYMPHS PCT: 13 %
Lymphs Abs: 2.1 10*3/uL (ref 0.7–4.0)
MONOS PCT: 15 %
Monocytes Absolute: 2.4 10*3/uL — ABNORMAL HIGH (ref 0.1–1.0)
NEUTROS PCT: 68 %
Neutro Abs: 10.9 10*3/uL — ABNORMAL HIGH (ref 1.7–7.7)

## 2016-05-12 LAB — CBC
HCT: 32.9 % — ABNORMAL LOW (ref 39.0–52.0)
HEMOGLOBIN: 10.5 g/dL — AB (ref 13.0–17.0)
MCH: 30.8 pg (ref 26.0–34.0)
MCHC: 31.9 g/dL (ref 30.0–36.0)
MCV: 96.5 fL (ref 78.0–100.0)
PLATELETS: 192 10*3/uL (ref 150–400)
RBC: 3.41 MIL/uL — AB (ref 4.22–5.81)
RDW: 14.5 % (ref 11.5–15.5)
WBC: 16 10*3/uL — AB (ref 4.0–10.5)

## 2016-05-12 LAB — COMPREHENSIVE METABOLIC PANEL
ALBUMIN: 1.5 g/dL — AB (ref 3.5–5.0)
ALT: 39 U/L (ref 17–63)
ANION GAP: 3 — AB (ref 5–15)
AST: 45 U/L — AB (ref 15–41)
Alkaline Phosphatase: 299 U/L — ABNORMAL HIGH (ref 38–126)
BILIRUBIN TOTAL: 0.7 mg/dL (ref 0.3–1.2)
BUN: 21 mg/dL — AB (ref 6–20)
CHLORIDE: 103 mmol/L (ref 101–111)
CO2: 28 mmol/L (ref 22–32)
Calcium: 8.1 mg/dL — ABNORMAL LOW (ref 8.9–10.3)
Creatinine, Ser: 0.86 mg/dL (ref 0.61–1.24)
GFR calc Af Amer: >60 mL/min (ref >60)
GFR calc non Af Amer: >60 mL/min (ref >60)
GLUCOSE: 154 mg/dL — AB (ref 65–99)
POTASSIUM: 3.8 mmol/L (ref 3.5–5.1)
SODIUM: 134 mmol/L — AB (ref 135–145)
TOTAL PROTEIN: 5.4 g/dL — AB (ref 6.5–8.1)

## 2016-05-12 LAB — MAGNESIUM: Magnesium: 1.9 mg/dL (ref 1.7–2.4)

## 2016-05-12 LAB — TRIGLYCERIDES: Triglycerides: 41 mg/dL (ref <150)

## 2016-05-12 LAB — GLUCOSE, CAPILLARY
GLUCOSE-CAPILLARY: 160 mg/dL — AB (ref 65–99)
GLUCOSE-CAPILLARY: 185 mg/dL — AB (ref 65–99)
GLUCOSE-CAPILLARY: 174 mg/dL — AB (ref 65–99)
GLUCOSE-CAPILLARY: 176 mg/dL — AB (ref 65–99)
Glucose-Capillary: 141 mg/dL — ABNORMAL HIGH (ref 65–99)
Glucose-Capillary: 155 mg/dL — ABNORMAL HIGH (ref 65–99)

## 2016-05-12 LAB — PHOSPHORUS: Phosphorus: 3 mg/dL (ref 2.5–4.6)

## 2016-05-12 LAB — PREALBUMIN: PREALBUMIN: 5.8 mg/dL — AB (ref 18–38)

## 2016-05-12 LAB — PROTIME-INR
INR: 1.28
PROTHROMBIN TIME: 16.1 s — AB (ref 11.4–15.2)

## 2016-05-12 MED ORDER — TRACE MINERALS CR-CU-MN-SE-ZN 10-1000-500-60 MCG/ML IV SOLN
INTRAVENOUS | Status: AC
  Administered 2016-05-12: 17:00:00 via INTRAVENOUS
  Filled 2016-05-12: qty 960

## 2016-05-12 MED ORDER — BISACODYL 10 MG RE SUPP: 5.0000 mg | Freq: Once | RECTAL | Status: DC

## 2016-05-12 NOTE — Consult Note (Signed)
Chief Complaint: Patient was seen in consultation today for infected pancreatic pseudocyst drain placement Chief Complaint  Patient presents with  . Weakness   at the request of Dr Darnelle Spangle  Referring Physician(s): Dr Darnelle Spangle  Supervising Physician: Gilmer Mor  Patient Status: [facility] - In-pt  History of Present Illness: Colin West is a 82 y.o. male   Gallstone pancreatitis--infected pancreatic necrosis Aspiration performed 11/18: +Ecoli Still with increasing wbc abd pain; N/V Generalized weakness Wt loss CT Abd 11/24: Continued presence of multifocal abscesses within the retroperitoneal surrounding atretic pancreas, which does not appear to be significantly is changed in size, although there is an increased amount of gas present within the loculations consistent with infection.  Request for drain placement per Dr Dwain Sarna Dr [address] has reviewed imaging and approves procedure   Past Medical History:  Diagnosis Date  . Anxiety   . Carotid artery stenosis    a. < 40% bilateral stenosis by [place] in 2014  . Depression    denies  . Diabetes mellitus    denies  . History of kidney stones   . Hyperlipidemia   . Hypertension     Past Surgical History:  Procedure Laterality Date  . ELBOW ARTHROSCOPY Right   . INGUINAL HERNIA REPAIR Left Sept 2006   Dr Corliss Skains  . INGUINAL HERNIA REPAIR Bilateral 06/01/2013   Procedure: LAPAROSCOPIC EXPLORATION BILATERAL INGUINAL HERNIA REPAIR;  Surgeon: Ardeth Sportsman, MD;  Location: [facility] OR;  Service: General;  Laterality: Bilateral;  Left Femoral, Left Recurrent inguinal, Right Inguinal  . INSERTION OF MESH Bilateral 06/01/2013   Procedure: INSERTION OF MESH;  Surgeon: Ardeth Sportsman, MD;  Location: [facility] OR;  Service: General;  Laterality: Bilateral;  Left Femoral, Left Recurrent inguinal, Right Inguinal  . LAPAROSCOPIC LYSIS OF ADHESIONS Bilateral 06/01/2013   Procedure: LAPAROSCOPIC LYSIS OF ADHESIONS;  Surgeon: Ardeth Sportsman, MD;  Location: [facility] OR;  Service: General;  Laterality: Bilateral;  . LUNG REMOVAL, PARTIAL  Sept. 2011    Dr. Edwyna Shell  . ROTATOR CUFF REPAIR     Bilateral repair    Allergies: Patient has no known allergies.  Medications: Prior to Admission medications   Medication Sig Start Date End Date Taking? Authorizing Provider  amoxicillin-clavulanate (AUGMENTIN) 875-125 MG tablet Take 1 tablet by mouth every 12 (twelve) hours. 04/28/16  Yes Marinda Elk, MD  atorvastatin (LIPITOR) 10 MG tablet Take 10 mg by mouth daily.   Yes Historical Provider, MD  famotidine (PEPCID) 20 MG tablet Take 1 tablet (20 mg total) by mouth 2 (two) times daily. 04/07/16  Yes Osvaldo Shipper, MD  feeding supplement, GLUCERNA SHAKE, (GLUCERNA SHAKE) LIQD Take 237 mLs by mouth 3 (three) times daily between meals. 04/07/16  Yes Osvaldo Shipper, MD  lipase/protease/amylase (CREON) 36000 UNITS CPEP capsule Take 1 capsule (36,000 Units total) by mouth 3 (three) times daily before meals. 04/07/16  Yes Osvaldo Shipper, MD  lisinopril (PRINIVIL,ZESTRIL) 2.5 MG tablet Take 1 tablet (2.5 mg total) by mouth daily. 04/29/16  Yes Marinda Elk, MD  metFORMIN (GLUCOPHAGE-XR) 500 MG 24 hr tablet Take 500 mg by mouth daily with breakfast.   Yes Historical Provider, MD  metoCLOPramide (REGLAN) 5 MG tablet Take 5-10 mg by mouth every 8 (eight) hours as needed (for hiccups).   Yes Historical Provider, MD  metoprolol (LOPRESSOR) 100 MG tablet Take 1 tablet (100 mg total) by mouth 2 (two) times daily. 04/28/16  Yes Marinda Elk, MD  ondansetron [facility])  4 MG tablet Take 4 mg by mouth every 8 (eight) hours as needed for nausea or vomiting.   Yes Historical Provider, MD  potassium chloride SA (K-DUR,KLOR-CON) 20 MEQ tablet Take 1 tablet (20 mEq total) by mouth daily. 04/07/16  Yes Osvaldo Shipper, MD  traMADol (ULTRAM) 50 MG tablet Take 1 tablet (50 mg total) by mouth every 6 (six) hours as needed for moderate pain.  04/28/16  Yes Marinda Elk, MD     Family History  Problem Relation Age of Onset  . Heart disease Brother     Heart Disease before age 18  . Heart attack Brother     Social History   Social History  . Marital status: Divorced    Spouse name: N/A  . Number of children: N/A  . Years of education: N/A   Social History Main Topics  . Smoking status: Former Smoker    Quit date: 05/31/1973  . Smokeless tobacco: Never Used  . Alcohol use No  . Drug use: No  . Sexual activity: Not Asked   Other Topics Concern  . None   Social History Narrative  . None    Review of Systems: A 12 point ROS discussed and pertinent positives are indicated in the HPI above.  All other systems are negative.  Review of Systems  Constitutional: Positive for activity change, appetite change, fatigue and unexpected weight change. Negative for fever.  Respiratory: Negative for shortness of breath.   Gastrointestinal: Positive for abdominal pain and nausea.  Musculoskeletal: Positive for gait problem.  Neurological: Positive for weakness.  Psychiatric/Behavioral: Negative for behavioral problems and confusion.    Vital Signs: BP 118/73   Pulse 80   Temp 97.7 F (36.5 C) (Oral)   Resp 17   Ht 5\' 9"  (1.753 m)   Wt 178 lb (80.7 kg)   SpO2 98%   BMI 26.29 kg/m   Physical Exam  Constitutional: He is oriented to person, place, and time.  Cardiovascular: Normal rate and regular rhythm.   Pulmonary/Chest: Effort normal and breath sounds normal.  Abdominal: Soft. Bowel sounds are normal. There is no tenderness.  Musculoskeletal: Normal range of motion.  Neurological: He is alert and oriented to person, place, and time.  Skin: Skin is warm and dry.  Psychiatric: He has a normal mood and affect. His behavior is normal. Judgment and thought content normal.  Nursing note and vitals reviewed.   Mallampati Score:  MD Evaluation Airway: WNL Heart: WNL Abdomen: WNL Chest/ Lungs: WNL ASA   Classification: 3 Mallampati/Airway Score: Two  Imaging: Ct Abdomen Pelvis Wo Contrast  Result Date: 04/22/2016 CLINICAL DATA:  Leukocytosis, sepsis. History of recent pancreatitis. EXAM: CT ABDOMEN AND PELVIS WITHOUT CONTRAST TECHNIQUE: Multidetector CT imaging of the abdomen and pelvis was performed following the standard protocol without IV contrast. COMPARISON:  CT from 04/03/2016 FINDINGS: Lower chest: Bibasilar atelectasis. Stable scarring at the right base. No pneumonic consolidation or pneumothorax. Old right sixth and seventh rib fractures. Normal size cardiac chambers without significant pericardial effusion. Coronary arteriosclerosis is seen. Hepatobiliary: Hepatic steatosis. No space-occupying mass of the liver though assessment is limited by lack of IV contrast. Uncomplicated cholelithiasis. No biliary dilatation. Pancreas: The pancreas line is difficult to discretely identify due to the surrounding peripancreatic fatty inflammation and edema. This appears overall stable without apparent findings of pancreatic necrosis nor pseudocyst formation. Spleen: Normal in size without focal abnormality. Adrenals/Urinary Tract: Normal normal bilateral adrenal glands. Two punctate interpolar right renal calculi without obstructive  uropathy. Apparent passage of the left UVJ stone since prior exam. No hydroureteronephrosis is noted. Stable bilateral perinephric fat stranding. Stomach/Bowel: Stomach is moderately distended with enteric contrast. No bowel obstruction or definite inflammation. Extensive colonic diverticulosis is noted along the colon, in particular involving the sigmoid colon. No findings of acute diverticulitis. No definite abscess. Colonic interposition of the liver seen. There is a normal-appearing appendix. Vascular/Lymphatic: Aortic and branch vessel atherosclerosis. No intraperitoneal, retroperitoneal, pelvic sidewall or inguinal lymphadenopathy. Reproductive: The prostate is enlarged with  calcifications as before at the junction of the central and peripheral zone. Other: No abdominal wall hernia or abnormality. No abdominopelvic ascites. Musculoskeletal: No worrisome lytic or sclerotic osseous lesions. IMPRESSION: 1. Stable sequela of pancreatitis without pancreatic necrosis, definite abscess on this unenhanced study nor pseudocyst formation. 2. Uncomplicated cholelithiasis. 3. Passage of left UVJ stone since prior exam. 4. Stable hepatic steatosis. 5. Abdominal aortic atherosclerosis. Electronically Signed   By: Tollie Ethavid  Kwon M.D.   On: 04/22/2016 18:07   Dg Chest 1 View  Result Date: 05/08/2016 CLINICAL DATA:  Coughing episode EXAM: CHEST 1 VIEW COMPARISON:  05/03/2016 FINDINGS: Semi-erect portable view chest. A right upper extremity catheter tip overlies the cavoatrial junction. Esophageal tube tip is not included but probably positioned over duodenum. Markedly low lung volumes. Probable tiny effusions. Mild bibasilar atelectasis. No focal consolidations. Stable cardiomediastinal silhouette. No pneumothorax. Postsurgical changes in the right lung apex. Old right-sided rib deformities. IMPRESSION: 1. Markedly low lung volumes with suspected tiny effusions. 2. Mild bibasilar atelectasis. Electronically Signed   By: Jasmine PangKim  Fujinaga M.D.   On: 05/08/2016 16:33   Dg Chest 1 View  Result Date: 04/26/2016 CLINICAL DATA:  Productive cough, diabetes, hypertension EXAM: CHEST 1 VIEW COMPARISON:  He 04/24/2016 FINDINGS: Right PICC line tip remains at the SVC RA junction level. Low lung volumes persist with slight interstitial prominence as before. Postop changes in the right lung. No developing focal pneumonia, collapse or consolidation. Negative for effusion or pneumothorax. Trachea is midline. IMPRESSION: Stable low lung volumes and slight interstitial prominence, nonspecific. Stable postop changes in the right lung No significant interval change. Electronically Signed   By: Judie PetitM.  Shick M.D.   On:  04/26/2016 09:16   X-ray Chest Pa And Lateral  Result Date: 05/03/2016 CLINICAL DATA:  Preoperative examination.  PICC placement. EXAM: CHEST  2 VIEW COMPARISON:  PA and lateral chest 05/02/2016. FINDINGS: New right PICC is in place with the tip near the superior cavoatrial junction. Lung volumes are low but the lungs are clear. Heart size is normal. No pneumothorax or pleural effusion. Remote right rib fractures are seen. Postoperative change right lung apex and right shoulder noted. IMPRESSION: No active cardiopulmonary disease. Electronically Signed   By: Drusilla Kannerhomas  Dalessio M.D.   On: 05/03/2016 09:26   Dg Chest 2 View  Result Date: 05/02/2016 CLINICAL DATA:  Weakness and fever EXAM: CHEST  2 VIEW COMPARISON:  04/26/2016; 03/28/2016; 05/31/2013 ; CT abdomen pelvis - 04/22/2016 FINDINGS: Grossly unchanged cardiac silhouette and mediastinal contours with atherosclerotic plaque within thoracic aorta. Lung volumes were reduced. Stable postsurgical change of the right upper lung with volume loss and elevation involving the anterior aspect the right hemidiaphragm. Note is again made of a interposed loop of hepatic flexure of the colon below the right hemidiaphragm as seen on prior abdominal CT. Grossly unchanged mild diffuse slightly nodular thickening of the pulmonary interstitium. No discrete focal airspace opacities. No pleural effusion or pneumothorax. No evidence of edema. No acute osseus abnormalities.  Post right rotator cuff repair. Degenerative change within the lower thoracic and upper lumbar spine. IMPRESSION: 1. Similar findings of decreased lung volumes without acute cardiopulmonary disease. No new focal airspace opacities to suggest pneumonia. 2. Stable postsurgical change of the right upper lung with associated volume loss. Electronically Signed   By: Simonne Come M.D.   On: 05/02/2016 13:27   Dg Chest 2 View  Result Date: 04/22/2016 CLINICAL DATA:  Strain fatigue and left G which began earlier  today. History of diabetes and hypertension and unspecified lung surgery. Former smoker. EXAM: CHEST  2 VIEW COMPARISON:  Portable chest x-ray of March 28, 2016 FINDINGS: There is mild chronic volume loss on the right with elevation of the hemidiaphragm. The aerated portion of the right lung is clear. The left lung is clear. The heart and pulmonary vascularity are normal. There is old deformity of the posterior aspect of the right sixth and seventh ribs. There is degenerative change of the left shoulder. The thoracic spine exhibits mild multilevel degenerative disc space narrowing. IMPRESSION: Chronic volume loss on the right with mild elevation of the hemidiaphragm. No evidence of pneumonia, CHF, nor other acute cardiopulmonary abnormality. Electronically Signed   By: David  [place] M.D.   On: 04/22/2016 15:39   Dg Abd 1 View  Result Date: 05/08/2016 CLINICAL DATA:  NG tube placement EXAM: ABDOMEN - 1 VIEW COMPARISON:  05/06/2016 FINDINGS: Motion artifact limits the examination. Esophageal tube tip overlies the second portion of the duodenum. Some gaseous dilatation of bowel could relate to a mild ileus. IMPRESSION: Esophageal tube tip overlies the second portion of the duodenum. Mild gaseous dilatation of bowel could relate to a mild ileus. Electronically Signed   By: Jasmine Pang M.D.   On: 05/08/2016 16:35   Ct Abdomen Pelvis W Contrast  Result Date: 05/09/2016 CLINICAL DATA:  Hemorrhagic pancreatitis, nausea, vomiting. EXAM: CT ABDOMEN AND PELVIS WITH CONTRAST TECHNIQUE: Multidetector CT imaging of the abdomen and pelvis was performed using the standard protocol following bolus administration of intravenous contrast. CONTRAST:  ISOVUE-300 IOPAMIDOL (ISOVUE-300) INJECTION 61% COMPARISON:  CT scan of May 02, 2016. FINDINGS: Lower chest: Mild left pleural effusion is noted with adjacent subsegmental atelectasis. Hepatobiliary: No gallstones are noted. Mild amount of fluid is noted around the  liver. No focal hepatic lesion is noted. Pancreas: There is again noted multifocal abscesses within the retroperitoneum surrounding atretic pancreas. Increased amount of gas is present within the larger loculations. Overall, does not appear to be significantly changed in size. Spleen: Normal. Adrenals/Urinary Tract: Right adrenal gland appears normal. Stable left adrenal gland enlargement is noted. Right nephrolithiasis is noted. No hydronephrosis or renal obstruction is noted. Urinary bladder appears normal. Stomach/Bowel: Feeding tube is seen with distal tip in third portion of duodenum. There is no evidence of bowel obstruction. Normal appendix. Diverticulosis of descending and sigmoid colon is noted without inflammation. Vascular/Lymphatic: Atherosclerosis of abdominal aorta is noted without aneurysm formation. Celiac, superior mesenteric and and inferior mesenteric arteries are patent. Reproductive: Stable mild prostatic enlargement with calcifications is noted. Other: Moderate anasarca is noted. Musculoskeletal: Severe multifocal degenerative disc disease is noted. IMPRESSION: Mild left pleural effusion with adjacent subsegmental atelectasis. Aortic atherosclerosis. Feeding tube seen with distal tip in third portion of duodenum. Diverticulosis of descending and sigmoid colon is noted without inflammation. Stable mild prostatic enlargement. Moderate anasarca. Continued presence of multifocal abscesses within the retroperitoneal surrounding atretic pancreas, which does not appear to be significantly is changed in size, although there is  an increased amount of gas present within the loculations consistent with infection. Electronically Signed   By: Lupita Raider, M.D.   On: 05/09/2016 17:15   Ct Abdomen Pelvis W Contrast  Result Date: 05/02/2016 CLINICAL DATA:  Hemorrhagic pancreatitis with periumbilical pain. EXAM: CT ABDOMEN AND PELVIS WITH CONTRAST TECHNIQUE: Multidetector CT imaging of the abdomen and  pelvis was performed using the standard protocol following bolus administration of intravenous contrast. CONTRAST:  75 cc of Isovue-300 IV COMPARISON:  04/22/2016 FINDINGS: Lower chest: There is bibasilar dependent atelectasis. Three-vessel coronary arteriosclerosis. No pericardial effusion. Heart size is within normal to the extent visible. There is aortic atherosclerosis of the descending aorta. There is a small hiatal hernia. Hepatobiliary: No space-occupying mass or abscess of the liver. Slight surface nodularity of the liver consistent cirrhosis. Gallstones are seen within a physiologically distended gallbladder. There is colonic interposition large bowel between the liver and ventral abdominal wall. Pancreas: There are multifocal abscesses within the phlegmonous inflammatory soft tissue mass outlining the pancreas which have developed since the interim. There is an approximately 5.8 x 3.5 x 5.6 cm abscess immediately anterior to the third portion of the duodenum interposed between the duodenum and SMA, series 2, image 46, series 5, image 52. There is an approximately 5.9 x 4.2 x 4.2 cm right upper quadrant abscess just anterior to the junction of the second and third portion of the duodenum, series 2 image 40 all, series 5, image 44. There is an approximately 6.1 x 3.9 x 3.3 cm abscess with air just anterior to the distal third portion of the duodenum and overlying the SMA, series 2 image 35 and series 5 image 44. Smaller foci of air seen within the phlegmonous mass further cephalad anterior to the pancreatic head and midbody, series 2, image 30. Faint enhancement of the atretic pancreatic gland is seen within the phlegmonous mass. There appears to have been some necrosis of portions of the pancreatic body and head since initial study from 04/03/2016. The portal and splenic veins remain patent with slight narrowing or mass-effect on the mid to distal splenic vein, series 2, image 32 . Spleen: Normal in size  without focal abnormality. Adrenals/Urinary Tract: Stable appearance of the adrenal glands and kidneys without obstructive uropathy. Bladder is partially distended. Stomach/Bowel: There is mild sympathetic thickening of small intestine in the region of the pancreas. No bowel obstruction is noted. Vascular/Lymphatic: Aortoiliac and branch vessel atherosclerosis. The celiac vessels, SMA and IMA appear patent. Reproductive: Prostate is mildly enlarged up to 5.3 cm with peripheral zone calcifications. Other: Small amount of fluid is seen in the pelvis with trace edema along the pericolic gutters and adjacent to the liver. Musculoskeletal: Thoracolumbar degenerative disc disease consistent with spondylosis. No acute osseous abnormality. 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 Decrease in pancreatic gland enhancement consistent with probable areas of necrosis since the initial study. Patent splenic and portal veins and SMA. Splenic vein is slightly narrowed along its midportion from the phlegmonous mass. Critical Value/emergent results were called by telephone at the time of interpretation on 05/02/2016 at 3:22 pm to Dr. Raeford Razor , who verbally acknowledged these results. Electronically Signed   By: Tollie Eth M.D.   On: 05/02/2016 15:22   Ct Aspiration  Result Date: 05/03/2016 INDICATION: Pancreatitis with peripancreatic abscess/pseudocyst. Please perform CT-guided aspiration for the acquisition of a sample for laboratory analysis. EXAM: CT GUIDANCE NEEDLE PLACEMENT COMPARISON:  CT abdomen pelvis -  05/02/2016 ; 04/22/2016 MEDICATIONS: The patient is currently admitted to the hospital and receiving intravenous antibiotics. The antibiotics were administered within an appropriate time frame prior to the initiation of the procedure. ANESTHESIA/SEDATION: Moderate (conscious) sedation was employed during this  procedure. A total of Versed 1 mg and Fentanyl 75 mcg was administered intravenously. Moderate Sedation Time: 15 minutes. The patient's level of consciousness and vital signs were monitored continuously by radiology nursing throughout the procedure under my direct supervision. CONTRAST:  None COMPLICATIONS: None immediate. PROCEDURE: Informed written consent was obtained from the patient after a discussion of the risks, benefits and alternatives to treatment. The patient was placed supine, slightly RPO on the CT gantry and a pre procedural CT was performed re-demonstrating the known ill-defined abscess/fluid collection about the pancreas. The procedure was planned. A timeout was performed prior to the initiation of the procedure. The skin overlying the left lateral abdomen was prepped and draped in the usual sterile fashion. The overlying soft tissues were anesthetized with 1% lidocaine with epinephrine. Appropriate trajectory was planned with the use of a 22 gauge spinal needle. An 18 gauge trocar needle was advanced into the abscess/fluid collection and a short Amplatz super stiff wire was coiled within the collection. Appropriate positioning was confirmed with a limited CT scan. The tract was dilated allowing placement of a temporary 8 [place] all-purpose drainage catheter. Approximately 75 cc of purulent fluid was aspirated as the 8 French drainage catheter was slowly retracted. A dressing was placed. The patient tolerated the procedure well without immediate post procedural complication. IMPRESSION: Successful CT guided aspiration of approximately 75 cc of purulent fluid from the peripancreatic fluid collection/pseudocyst. Samples were sent to the laboratory as requested by the ordering clinical team. Electronically Signed   By: Simonne Come M.D.   On: 05/03/2016 11:50   Dg Chest Port 1 View  Result Date: 05/12/2016 CLINICAL DATA:  Cough, history of hypertension, hyperlipidemia, and diabetes. EXAM: PORTABLE  CHEST 1 VIEW COMPARISON:  Portable chest x-ray of May 08, 2016 FINDINGS: The lung volumes remain low. There is increased density at the left lung base with new obscuration of the hemidiaphragm. The heart and pulmonary vascularity are normal. There is tortuosity of the ascending and descending thoracic aorta. The PICC line tip on the right projects over the midportion of the SVC. The feeding tube tip projects below the inferior margin of the image beyond the second portion of the duodenum. IMPRESSION: Increased density at the left lung base consistent with atelectasis or pneumonia with small left pleural effusion. Stable bilateral hypo nflation. Electronically Signed   By: David  [place] M.D.   On: 05/12/2016 09:27   Dg Chest Port 1 View  Result Date: 04/24/2016 CLINICAL DATA:  Central line placement EXAM: PORTABLE CHEST 1 VIEW COMPARISON:  04/24/2016 400 hours FINDINGS: Right upper extremity PICC placed. Tip is at the cavoatrial junction. Low volumes. Stable ovoid density towards the right apex. Normal heart size. No pneumothorax. IMPRESSION: Right upper extremity PICC placed. Tip is at the cavoatrial junction. Electronically Signed   By: Jolaine Click M.D.   On: 04/24/2016 13:31   Dg Chest Port 1 View  Result Date: 04/24/2016 CLINICAL DATA:  Short of breath EXAM: PORTABLE CHEST 1 VIEW COMPARISON:  04/22/2016 FINDINGS: Normal heart size. Ovoid density at the right apex is stable. Lungs are very under aerated but otherwise grossly clear. Chronic right rib deformities. No pneumothorax. IMPRESSION: Stable examination.  Stable ovoid density at the right apex. Electronically Signed  By: Jolaine ClickArthur  Hoss M.D.   On: 04/24/2016 07:56   Dg Abd Portable 1v  Result Date: 05/10/2016 CLINICAL DATA:  NG feeding tube placement EXAM: PORTABLE ABDOMEN - 1 VIEW COMPARISON:  None. FINDINGS: Mild gaseous distended small bowel loops in mid abdomen probable mild ileus. There is NG feeding tube with tip in proximal jejunum.  IMPRESSION: NG feeding tube with tip in proximal jejunum. Electronically Signed   By: Natasha MeadLiviu  Pop M.D.   On: 05/10/2016 13:28   Dg Abd Portable 1v  Result Date: 05/06/2016 CLINICAL DATA:  Feeding tube placement EXAM: PORTABLE ABDOMEN - 1 VIEW COMPARISON:  05/03/2016 FINDINGS: Upper abdomen is non included. Nonobstructed gas pattern. Esophageal tube tip overlies the junction of second and third portion of duodenum. IMPRESSION: Esophageal tube tip overlies the junction of second and third portion of duodenum. Electronically Signed   By: Jasmine PangKim  Fujinaga M.D.   On: 05/06/2016 19:09   Dg Abd Portable 1v  Result Date: 05/03/2016 CLINICAL DATA:  Enteric tube placement. EXAM: PORTABLE ABDOMEN - 1 VIEW COMPARISON:  05/03/2016 CT FINDINGS: A small bore feeding tube is identified with tip overlying the descending duodenum. The bowel gas pattern is unremarkable. No acute bony abnormalities are identified. IMPRESSION: Small bore feeding tube with tip overlying the descending duodenum. Electronically Signed   By: Harmon PierJeffrey  Hu M.D.   On: 05/03/2016 17:39    Labs:  CBC:  Recent Labs  05/06/16 0500 05/08/16 0415 05/10/16 0229 05/12/16 0541  WBC 15.5* 16.3* 14.1* 16.0*  HGB 11.1* 11.3* 10.8* 10.5*  HCT 34.5* 34.8* 32.2* 32.9*  PLT 246 190 171 192    COAGS:  Recent Labs  03/27/16 0500 04/23/16 0507 05/03/16 0242  INR 1.23 1.39 1.43    BMP:  Recent Labs  05/08/16 0415 05/09/16 0250 05/10/16 0229 05/12/16 0541  NA 137 138 135 134*  K 3.9 4.0 3.7 3.8  CL 107 105 105 103  CO2 27 26 25 28   GLUCOSE 189* 117* 112* 154*  BUN 21* 22* 20 21*  CALCIUM 8.0* 8.1* 8.0* 8.1*  CREATININE 0.58* 0.64 0.64 0.86  GFRNONAA >60 >60 >60 >60  GFRAA >60 >60 >60 >60    LIVER FUNCTION TESTS:  Recent Labs  05/06/16 0500 05/08/16 0415 05/10/16 0229 05/12/16 0541  BILITOT 0.6 0.4 0.6 0.7  AST 30 155* 53* 45*  ALT 18 53 43 39  ALKPHOS 91 317* 257* 299*  PROT 4.7* 4.9* 4.9* 5.4*  ALBUMIN 1.7* 1.6*  1.6* 1.5*    TUMOR MARKERS: No results for input(s): AFPTM, CEA, CA199, CHROMGRNA in the last 8760 hours.  Assessment and Plan:  Infected pancreatic pseudocyst +Ecoli per aspiration 11/18 Gallstone pancreatitis Now scheduled for infected pancreatic pseudocyst drain placement Risks and Benefits discussed with the patient including bleeding, infection, damage to adjacent structures, bowel perforation/fistula connection, and sepsis. All of the patient's questions were answered, patient is agreeable to proceed. Consent signed and in chart.   Thank you for this interesting consult.  I greatly enjoyed meeting Farris HasGraham N Imhof and look forward to participating in their care.  A copy of this report was sent to the requesting provider on this date.  Electronically Signed: Ralene MuskratURPIN,Roberth Berling A 05/12/2016, 12:29 PM   I spent a total of 40 Minutes    in face to face in clinical consultation, greater than 50% of which was counseling/coordinating care for pancreatic pseudocyst drain placement

## 2016-05-12 NOTE — Progress Notes (Signed)
PHARMACY - ADULT TOTAL PARENTERAL NUTRITION CONSULT NOTE   Pharmacy Consult:  TPN Indication:  Intolerance of enteral feeding  Patient Measurements: Height: '5\' 9"'$  (175.3 cm) Weight: 160 lb 11.5 oz (72.9 kg) IBW/kg (Calculated) : 70.7 TPN AdjBW (KG): 72.9 Body mass index is 23.73 kg/m.  Assessment:  65 YOM admitted 10/8-10/12/17 with hemorrhagic pancreatitis, readmitted 11/7-11/13/17 with severe Ecoli sepsis, and readmitted 05/02/16 with weakness, N/V, and abdominal pain.  CT showed intra-abdominal abscesses.  Patient has lost 20lb in the last 2 months and has severe malnutrition per RD assessment. Tube feeds were planned to start but patient with significant nausea and bilious emesis on 05/04/16 so pharmacy consulted to manage TPN.   GI: severe malnutrition with gallstone pancreatitis. Prealbumin improved to 5.8.  Creon, Protonix IV BID.  May go home on TF and come back for surgery.  IR to place drain today. Endo: hx DM on metformin PTA - CBGs acceptable Insulin requirements in the past 24 hours: 20 units in TPN bag + 15 units of SSI Lytes: low Na, others WNL Renal: SCr stable, CrCl ~70 ml/min - NS 10 ml/hr, low UOP 0.3 ml/kg/hr Pulm: stable on RA - Robitussin DM Cards: carotid artery stenosis, HTN, HLD - VSS, NSR on Lopressor, Lipitor, diltiazem gtt Hepatobil: alk phos/AST elevated, tbili / TG WNL Neuro: depression, anxiety.  Pain score 6-7, PRN morphine ID: Vanc/Primaxin/Fluc for E.coli abscess and possible necrotizing pancreatitis - afebrile, WBC up 16 Best Practices: SCDs, mouth care TPN Access: PICC placed 05/03/16 TPN start date: 05/05/16  Nutritional Goals (per RD recommendation on 11/25): 1900-2100 kCal and 100-125 g of protein per day  Current Nutrition:  Clinimix = 845 kCal and 48 mg protein per day Jevity 1.2 at 30 ml/hr = 864 kCal and '40mg'$  per day   Plan:  - Continue Clinimix E 5/20 at 40 ml/hr.  Hold lipids since patient is on tube feed.   - Jevity 1.2 at 30  ml/hr per MD - TPN and TF provide 1709 kCal and 88gm of protein per day, meeting 90% of kCal and 88% of protein needs. - Daily multivitamin and trace elements in TPN - Continue with 20 units of regular insulin in TPN and moderate SSI - F/U with TF advancement to D/C TPN   Colin West D. Mina Marble, PharmD, BCPS Pager:  [phone] 05/12/2016, 11:43 AM

## 2016-05-12 NOTE — Care Management Important Message (Signed)
Important Message  Patient Details  Name: Colin West MRN: [id number] Date of Birth: [birth year]   Medicare Important Message Given:  Yes    Colin West 05/12/2016, 11:49 AM

## 2016-05-12 NOTE — Progress Notes (Signed)
Subjective: Feels miserable, had bms, tol tube feeds  Objective: Vital signs in last 24 hours: Temp:  [97.7 F (36.5 C)-98.4 F (36.9 C)] 97.7 F (36.5 C) (11/27 0807) Pulse Rate:  [74-89] 80 (11/27 0907) Resp:  [17-28] 17 (11/27 0807) BP: (116-151)/(57-95) 118/73 (11/27 0907) SpO2:  [96 %-98 %] 98 % (11/27 0807) Weight:  [80.7 kg (178 lb)] 80.7 kg (178 lb) (11/27 0337) Last BM Date: 05/07/16  Intake/Output from previous day: 11/26 0701 - 11/27 0700 In: 1734 [I.V.:912.8; NG/GT:671.2; IV Piggyback:150] Out: 800 [Urine:800] Intake/Output this shift: Total I/O In: 110 [I.V.:10; IV Piggyback:100] Out: 375 [Urine:375]  GI: mild distended nontender some bs  Lab Results:   Recent Labs  05/10/16 0229 05/12/16 0541  WBC 14.1* 16.0*  HGB 10.8* 10.5*  HCT 32.2* 32.9*  PLT 171 192   BMET  Recent Labs  05/10/16 0229 05/12/16 0541  NA 135 134*  K 3.7 3.8  CL 105 103  CO2 25 28  GLUCOSE 112* 154*  BUN 20 21*  CREATININE 0.64 0.86  CALCIUM 8.0* 8.1*   PT/INR No results for input(s): LABPROT, INR in the last 72 hours. ABG No results for input(s): PHART, HCO3 in the last 72 hours.  Invalid input(s): PCO2, PO2  Studies/Results: Dg Chest Port 1 View  Result Date: 05/12/2016 CLINICAL DATA:  Cough, history of hypertension, hyperlipidemia, and diabetes. EXAM: PORTABLE CHEST 1 VIEW COMPARISON:  Portable chest x-ray of May 08, 2016 FINDINGS: The lung volumes remain low. There is increased density at the left lung base with new obscuration of the hemidiaphragm. The heart and pulmonary vascularity are normal. There is tortuosity of the ascending and descending thoracic aorta. The PICC line tip on the right projects over the midportion of the SVC. The feeding tube tip projects below the inferior margin of the image beyond the second portion of the duodenum. IMPRESSION: Increased density at the left lung base consistent with atelectasis or pneumonia with small left pleural  effusion. Stable bilateral hypo nflation. Electronically Signed   By: David  SwazilandJordan M.D.   On: 05/12/2016 09:27   Dg Abd Portable 1v  Result Date: 05/10/2016 CLINICAL DATA:  NG feeding tube placement EXAM: PORTABLE ABDOMEN - 1 VIEW COMPARISON:  None. FINDINGS: Mild gaseous distended small bowel loops in mid abdomen probable mild ileus. There is NG feeding tube with tip in proximal jejunum. IMPRESSION: NG feeding tube with tip in proximal jejunum. Electronically Signed   By: Natasha MeadLiviu  Pop M.D.   On: 05/10/2016 13:28    Anti-infectives: Anti-infectives    Start     Dose/Rate Route Frequency Ordered Stop   05/12/16 0000  vancomycin (VANCOCIN) IVPB 1000 mg/200 mL premix     1,000 mg 200 mL/hr over 60 Minutes Intravenous Every 12 hours 05/11/16 1322     05/11/16 1315  vancomycin (VANCOCIN) 500 mg in sodium chloride 0.9 % 100 mL IVPB     500 mg 100 mL/hr over 60 Minutes Intravenous STAT 05/11/16 1304 05/11/16 1558   05/10/16 1200  imipenem-cilastatin (PRIMAXIN) 500 mg in sodium chloride 0.9 % 100 mL IVPB     500 mg 200 mL/hr over 30 Minutes Intravenous Every 6 hours 05/10/16 0846     05/09/16 1200  cefTRIAXone (ROCEPHIN) 2 g in dextrose 5 % 50 mL IVPB  Status:  Discontinued     2 g 100 mL/hr over 30 Minutes Intravenous Every 24 hours 05/09/16 0801 05/09/16 1050   05/09/16 1200  cefTRIAXone (ROCEPHIN) 1 g in dextrose 5 %  50 mL IVPB  Status:  Discontinued     1 g 100 mL/hr over 30 Minutes Intravenous Every 24 hours 05/09/16 1050 05/10/16 0833   05/09/16 1100  vancomycin (VANCOCIN) IVPB 750 mg/150 ml premix  Status:  Discontinued     750 mg 150 mL/hr over 60 Minutes Intravenous Every 12 hours 05/09/16 1051 05/11/16 1322   05/05/16 1230  fluconazole (DIFLUCAN) IVPB 100 mg     100 mg 50 mL/hr over 60 Minutes Intravenous Every 24 hours 05/05/16 1135     05/03/16 1200  imipenem-cilastatin (PRIMAXIN) 500 mg in sodium chloride 0.9 % 100 mL IVPB  Status:  Discontinued     500 mg 200 mL/hr over 30  Minutes Intravenous Every 6 hours 05/03/16 1122 05/09/16 0801   05/03/16 0030  piperacillin-tazobactam (ZOSYN) IVPB 3.375 g  Status:  Discontinued     3.375 g 12.5 mL/hr over 240 Minutes Intravenous Every 8 hours 05/02/16 1647 05/03/16 1101   05/02/16 1930  fluconazole (DIFLUCAN) tablet 100 mg  Status:  Discontinued     100 mg Oral Daily 05/02/16 1816 05/05/16 1135   05/02/16 1600  piperacillin-tazobactam (ZOSYN) IVPB 3.375 g     3.375 g 100 mL/hr over 30 Minutes Intravenous  Once 05/02/16 1548 05/02/16 1720      Assessment/Plan: Gallstone pancreatitiswith infected pancreatic necrosis Severe protein calorie malnutrition E coli pyelonephritis  continue abx, not really improving, will ask ir to drain or hopefully placed drain today based on scan from 11/24. Is not low risk for surgery but may at some point be indicated on pancreas  [facility] CenterWAKEFIELD,Kendra Grissett 05/12/2016

## 2016-05-12 NOTE — Progress Notes (Signed)
PROGRESS NOTE  Colin West  LKG:[id number] DOB: 04-07-1934 DOA: 05/02/2016 PCP: Verl BangsADIONTCHENKO, ALEXEI, MD Outpatient Specialists:  Subjective: Continues to feel miserable ,no BM in 2 days     Brief Narrative:  Colin West is a 82 y.o. male  with complex history recently hospitalized from 10/8-10/12 for hemorrhagic pancreatitis from gallblader source, and on  11/7-11/13 for Severe Sepsis due to E.Coli pyelonephritis, discharged to  [facility] on oral antibiotics, brought to the emergency department with worsening generalized weakness, today  evere enough with  associated nausea and vomiting. He denies shortness of breath or productive cough. He has been unable to ambulate due to weakness. He denies any abdominal pain, he has occasional nausea without vomiting, he denies any diarrhea or constipation. He has not been able to eat a normal meal, lost about 15 lbs in the last 2 months.  He reports frequent hiccups. He denies any lower extremity swelling. Due to prolonged bed rest, he has developed some the ulcers in the sacrum and gluteal  areas as well as some minimal heel blanching hese is being cared at the facility as well. 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 -CT scan showed multiple peripancreatic abscesses, was recently in the hospital from 10/8-10/12 for hemorrhagic pancreatitis from gallbladder source. -Currently on Primaxin and Diflucan  for pancreatitis, vancomycin for possible HCAP, Pancreatic abscess aspiration now growing Escherichia coli  . Surgery recommends to continue Primaxin -Patient developed pancreatic necrosis while he was eating, need to be strict NPO with postpyloric feeding. Continue TPN Patient tube feeds increased to 30 ml/hr and TPN reduced. Patient tolerating.  -As per surgery, patient's nutritional and functional status needs to improve prior to consider surgery which seems unlikely. -patient refuses to go back to SNF in the interval that he is waiting for surgery Per surgery CT shows no sign of cholecystitis or even any sign of cholelithiasis. Laparoscopic cholecystectomy at this time will do nothing to improve his pancreatitis/ pancreatic abscesses Cardiology reconsulted 11/22-if patient requires laparoscopic surgery-moderate risk, if patient requires open surgery, it would be considered high risk, at this point cannot rule out ischemia, no further cardiac testing recommended. Continue perioperative beta blocker. Proceed with surgery if indicated Repeat 2-D echo shows mild improvement in EF to 35-40%. Continue   beta blocker No BM in the last couple of days, will give low dose of Dulcolax suppository Discussed with Dr Dwain [place] and conveyed to the daughter about IR consult and per cutaneous cath plx     Cough /probable HCAP Suspect developing  pna , will repeat chest x-ray   Continue vancomycin/imipenem Counseled RN to maintain strict aspiration precautions and pulmonary toilet Leukocytosis improving   Protein calorie malnutrition, severe  -Lost 20 pounds in the past 2 months, albumin continues to decline -Per general surgery recommendation try postpyloric enteral nutrition prior to TPN. Currently on both TPN and enteric feeding held because of ileus, now resumed -CORTRAK, in place  Atrial fibrillation, transient -Transient atrial fibrillation with heart  rate 130s, back to sinus rhythm with IV/by  mouth metoprolol. Continue oral metoprolol -Not sure if he needs anticoagulation for transient transient A. fib.  TSH  2.319  Type II Diabetes Current blood sugar level is 174 Currently nothing by mouth, per general surgery once into postpyloric feeding.     Hypertension BP 125/65   Pulse 86   Controlled Continue home anti-hypertensive medications    Hiccups This is likely secondary to the irritation of the diaphragm from pancreatic abscess, on Reglan.  Hyperlipidemia Continue home statins   Dilated Cardiomyopathy in view of recent sepsis 2 D echo recently shows EF 30-35 % 11/10,    Cautiously monitor fluid status   ?Dementia/encephalopathic   Minimize narcotics     DVT prophylaxis:  Code Status: Full Code Family Communication:  Discussed with son and brother by the bedside Disposition Plan: refusing SNF,  Not stable for DC   Diet: Diet NPO time specified Except for: Sips with Meds TPN (CLINIMIX-E) Adult  Consultants:   Gen. surgery.  Interventional radiology  Cardiology  Procedures:   Aspiration of abscess on 05/04/2016  Antibiotics  Imipenem 11/18-  Vancomycin 11/24-  Fluconazole 11/20-    Objective: Vitals:   05/11/16 2328 05/12/16 0335 05/12/16 0337 05/12/16 0807  BP:  116/66  124/68  Pulse:  79  82  Resp:  18  17  Temp: 98.4 F (36.9 C) 97.8 F (36.6 C)  97.7 F (36.5 C)  TempSrc: Oral Oral  Oral  SpO2:  96%  98%  Weight:   80.7 kg (178 lb)   Height:        Intake/Output Summary (Last 24 hours) at 05/12/16 0905 Last data filed at 05/12/16 1610  Gross per 24 hour  Intake             1834 ml  Output             1025 ml  Net              809 ml   Filed Weights   05/10/16 0500 05/11/16 0419 05/12/16 0337  Weight: 80.7 kg (178 lb) 79.8 kg (176 lb) 80.7 kg (178 lb)    Examination: General exam: Appears calm and comfortable  Respiratory system: Clear to auscultation. Respiratory effort normal. Cardiovascular system: S1 &  S2 heard, RRR. No JVD, murmurs, rubs, gallops or clicks. No pedal edema. Gastrointestinal system: Abdomen is nondistended, soft and nontender. No organomegaly or masses felt. Normal bowel sounds heard. Central nervous system: Alert and oriented. No focal neurological deficits. Extremities: Symmetric 5 x 5 power. Skin: No rashes, lesions or ulcers Psychiatry: Judgement and insight appear normal. Mood & affect appropriate.   Data Reviewed: I have personally reviewed following labs and imaging studies  CBC:  Recent Labs Lab 05/06/16 0500 05/08/16 0415 05/10/16 0229 05/12/16 0541  WBC 15.5* 16.3* 14.1* 16.0*  NEUTROABS 11.8*  --   --  PENDING  HGB 11.1* 11.3* 10.8* 10.5*  HCT 34.5* 34.8* 32.2* 32.9*  MCV 96.1 96.1 95.0 96.5  PLT 246 190 171 192   Basic Metabolic Panel:  Recent Labs Lab 05/06/16 0500 05/07/16 0425 05/08/16 0415 05/09/16 0250 05/10/16 0229 05/12/16 0541  NA 137 137 137 138 135 134*  K 3.2* 3.5 3.9 4.0 3.7 3.8  CL 105 106 107 105 105 103  CO2 24 25 27 26 25 28   GLUCOSE 201* 150* 189* 117* 112* 154*  BUN 23* 18 21* 22* 20 21*  CREATININE 0.65 0.51* 0.58*  0.64 0.64 0.86  CALCIUM 7.9* 8.0* 8.0* 8.1* 8.0* 8.1*  MG 1.8  --  1.9  --  1.8 1.9  PHOS 2.1* 2.8 2.4* 2.6 3.2 3.0   GFR: Estimated Creatinine Clearance: 68.5 mL/min (by C-G formula based on SCr of 0.86 mg/dL). Liver Function Tests:  Recent Labs Lab 05/06/16 0500 05/08/16 0415 05/10/16 0229 05/12/16 0541  AST 30 155* 53* 45*  ALT 18 53 43 39  ALKPHOS 91 317* 257* 299*  BILITOT 0.6 0.4 0.6 0.7  PROT 4.7* 4.9* 4.9* 5.4*  ALBUMIN 1.7* 1.6* 1.6* 1.5*    Recent Labs Lab 05/10/16 0229  LIPASE 18    Recent Labs Lab 05/11/16 0223  AMMONIA 19   Coagulation Profile: No results for input(s): INR, PROTIME in the last 168 hours. Cardiac Enzymes: No results for input(s): CKTOTAL, CKMB, CKMBINDEX, TROPONINI in the last 168 hours. BNP (last 3 results) No results for input(s): PROBNP in the last  8760 hours. HbA1C: No results for input(s): HGBA1C in the last 72 hours. CBG:  Recent Labs Lab 05/11/16 2002 05/11/16 2333 05/12/16 0038 05/12/16 0346 05/12/16 0808  GLUCAP 119* 179* 155* 141* 160*   Lipid Profile:  Recent Labs  05/12/16 0541  TRIG 41   Thyroid Function Tests: No results for input(s): TSH, T4TOTAL, FREET4, T3FREE, THYROIDAB in the last 72 hours. Anemia Panel: No results for input(s): VITAMINB12, FOLATE, FERRITIN, TIBC, IRON, RETICCTPCT in the last 72 hours. Urine analysis:    Component Value Date/Time   COLORURINE AMBER (A) 05/02/2016 1149   APPEARANCEUR CLOUDY (A) 05/02/2016 1149   LABSPEC 1.024 05/02/2016 1149   PHURINE 6.0 05/02/2016 1149   GLUCOSEU NEGATIVE 05/02/2016 1149   HGBUR NEGATIVE 05/02/2016 1149   BILIRUBINUR SMALL (A) 05/02/2016 1149   KETONESUR NEGATIVE 05/02/2016 1149   PROTEINUR NEGATIVE 05/02/2016 1149   UROBILINOGEN 0.2 03/08/2010 1026   NITRITE NEGATIVE 05/02/2016 1149   LEUKOCYTESUR SMALL (A) 05/02/2016 1149   Sepsis Labs: @LABRCNTIP (procalcitonin:4,lacticidven:4)  ) Recent Results (from the past 240 hour(s))  Culture, blood (Routine x 2)     Status: None   Collection Time: 05/02/16 11:45 AM  Result Value Ref Range Status   Specimen Description BLOOD LEFT ANTECUBITAL  Final   Special Requests BOTTLES DRAWN AEROBIC AND ANAEROBIC 5CC  Final   Culture NO GROWTH 5 DAYS  Final   Report Status 05/07/2016 FINAL  Final  Urine culture     Status: None   Collection Time: 05/02/16 11:49 AM  Result Value Ref Range Status   Specimen Description URINE, CLEAN CATCH  Final   Special Requests NONE  Final   Culture NO GROWTH  Final   Report Status 05/03/2016 FINAL  Final  Culture, blood (Routine x 2)     Status: None   Collection Time: 05/02/16 12:40 PM  Result Value Ref Range Status   Specimen Description BLOOD RIGHT ANTECUBITAL  Final   Special Requests BOTTLES DRAWN AEROBIC AND ANAEROBIC  Final   Culture NO GROWTH 5 DAYS   Final   Report Status 05/07/2016 FINAL  Final  MRSA PCR Screening     Status: None   Collection Time: 05/02/16  6:32 PM  Result Value Ref Range Status   MRSA by PCR NEGATIVE NEGATIVE Final    Comment:        The GeneXpert MRSA Assay (FDA approved for NASAL specimens only), is one component of a comprehensive MRSA colonization surveillance program. It is not intended to diagnose MRSA infection nor to guide  or monitor treatment for MRSA infections.   Aerobic/Anaerobic Culture (surgical/deep wound)     Status: None   Collection Time: 05/03/16  2:10 PM  Result Value Ref Range Status   Specimen Description DRAINAGE  Final   Special Requests Normal  Final   Gram Stain   Final    ABUNDANT WBC PRESENT, PREDOMINANTLY PMN MODERATE GRAM NEGATIVE RODS    Culture MODERATE ESCHERICHIA COLI NO ANAEROBES ISOLATED   Final   Report Status 05/08/2016 FINAL  Final   Organism ID, Bacteria ESCHERICHIA COLI  Final      Susceptibility   Escherichia coli - MIC*    AMPICILLIN >=32 RESISTANT Resistant     CEFAZOLIN <=4 SENSITIVE Sensitive     CEFEPIME <=1 SENSITIVE Sensitive     CEFTAZIDIME <=1 SENSITIVE Sensitive     CEFTRIAXONE <=1 SENSITIVE Sensitive     CIPROFLOXACIN >=4 RESISTANT Resistant     GENTAMICIN <=1 SENSITIVE Sensitive     IMIPENEM <=0.25 SENSITIVE Sensitive     TRIMETH/SULFA >=320 RESISTANT Resistant     AMPICILLIN/SULBACTAM >=32 RESISTANT Resistant     PIP/TAZO <=4 SENSITIVE Sensitive     Extended ESBL NEGATIVE Sensitive     * MODERATE ESCHERICHIA COLI     Invalid input(s): PROCALCITONIN, LACTICACIDVEN   Radiology Studies: Dg Abd Portable 1v  Result Date: 05/10/2016 CLINICAL DATA:  NG feeding tube placement EXAM: PORTABLE ABDOMEN - 1 VIEW COMPARISON:  None. FINDINGS: Mild gaseous distended small bowel loops in mid abdomen probable mild ileus. There is NG feeding tube with tip in proximal jejunum. IMPRESSION: NG feeding tube with tip in proximal jejunum. Electronically  Signed   By: Natasha MeadLiviu  Pop M.D.   On: 05/10/2016 13:28        Scheduled Meds: . atorvastatin  10 mg Oral q1800  . chlorhexidine  15 mL Mouth Rinse BID  . fluconazole (DIFLUCAN) IV  100 mg Intravenous Q24H  . guaiFENesin-dextromethorphan  5 mL Oral Q8H  . imipenem-cilastatin  500 mg Intravenous Q6H  . insulin aspart  0-15 Units Subcutaneous Q4H  . lipase/protease/amylase  36,000 Units Oral TID AC  . mouth rinse  15 mL Mouth Rinse q12n4p  . metoprolol  100 mg Oral BID  . pantoprazole (PROTONIX) IV  40 mg Intravenous Q12H  . sodium chloride flush  10-40 mL Intracatheter Q12H  . sodium chloride flush  3 mL Intravenous Q12H  . vancomycin  1,000 mg Intravenous Q12H   Continuous Infusions: . sodium chloride 10 mL/hr at 05/09/16 1039  . diltiazem (CARDIZEM) infusion    . Marland Kitchen.TPN (CLINIMIX-E) Adult 40 mL/hr at 05/11/16 1827   And  . fat emulsion 240 mL (05/11/16 1827)  . feeding supplement (JEVITY 1.2 CAL) 1,000 mL (05/12/16 0345)     LOS: 10 days    Time spent: 35 minutes    Richarda OverlieABROL,Tobie Perdue, MD [facility] Pager [phone]  If 7PM-7AM, please contact night-coverage [email or web address] Password [email or web address] 05/12/2016, 9:05 AM

## 2016-05-13 ENCOUNTER — Inpatient Hospital Stay (HOSPITAL_COMMUNITY): Payer: Medicare Other

## 2016-05-13 LAB — GLUCOSE, CAPILLARY
GLUCOSE-CAPILLARY: 110 mg/dL — AB (ref 65–99)
GLUCOSE-CAPILLARY: 131 mg/dL — AB (ref 65–99)
GLUCOSE-CAPILLARY: 149 mg/dL — AB (ref 65–99)
GLUCOSE-CAPILLARY: 153 mg/dL — AB (ref 65–99)
Glucose-Capillary: 140 mg/dL — ABNORMAL HIGH (ref 65–99)
Glucose-Capillary: 136 mg/dL — ABNORMAL HIGH (ref 65–99)

## 2016-05-13 MED ORDER — FENTANYL CITRATE (PF) 100 MCG/2ML IJ SOLN
INTRAMUSCULAR | Status: AC | PRN
  Administered 2016-05-13: 25 ug via INTRAVENOUS

## 2016-05-13 MED ORDER — MIDAZOLAM HCL 2 MG/2ML IJ SOLN
INTRAMUSCULAR | Status: AC | PRN
  Administered 2016-05-13: 1 mg via INTRAVENOUS

## 2016-05-13 MED ORDER — TRACE MINERALS CR-CU-MN-SE-ZN 10-1000-500-60 MCG/ML IV SOLN
INTRAVENOUS | Status: AC
  Administered 2016-05-13: 18:00:00 via INTRAVENOUS
  Filled 2016-05-13: qty 960

## 2016-05-13 MED ORDER — LIDOCAINE HCL 1 % IJ SOLN
INTRAMUSCULAR | Status: AC
  Filled 2016-05-13: qty 20

## 2016-05-13 MED ORDER — MIDAZOLAM HCL 2 MG/2ML IJ SOLN
INTRAMUSCULAR | Status: AC
  Filled 2016-05-13: qty 2

## 2016-05-13 MED ORDER — FENTANYL CITRATE (PF) 100 MCG/2ML IJ SOLN
INTRAMUSCULAR | Status: AC
  Filled 2016-05-13: qty 2

## 2016-05-13 NOTE — Sedation Documentation (Signed)
Vital signs stable. 

## 2016-05-13 NOTE — Progress Notes (Signed)
PHARMACY - ADULT TOTAL PARENTERAL NUTRITION CONSULT NOTE   Pharmacy Consult:  TPN Indication:  Intolerance of enteral feeding  Patient Measurements: Height: 5' 9" (175.3 cm) Weight: 160 lb 11.5 oz (72.9 kg) IBW/kg (Calculated) : 70.7 TPN AdjBW (KG): 72.9 Body mass index is 23.73 kg/m.  Assessment:  82 YOM admitted 10/8-10/12/17 with hemorrhagic pancreatitis, readmitted 11/7-11/13/17 with severe Ecoli sepsis, and readmitted 05/02/16 with weakness, N/V, and abdominal pain.  CT showed intra-abdominal abscesses.  Patient has lost 20lb in the last 2 months and has severe malnutrition per RD assessment. Tube feeds were planned to start but patient with significant nausea and bilious emesis on 05/04/16 so pharmacy consulted to manage TPN.   GI: severe malnutrition with gallstone pancreatitis. Prealbumin improved to 5.8.  Creon, Protonix IV BID.  May go home on TF and come back for surgery.  IR to place drain today (off and on TF for procedure) Endo: hx DM on metformin PTA - CBGs acceptable Insulin requirements in the past 24 hours: 20 units in TPN bag + 17 units of SSI Lytes: 11/27 labs - low Na, others WNL Renal: SCr stable, CrCl ~70 ml/min - NS 10 ml/hr, good UOP 0.7 ml/kg/hr Pulm: stable on RA - Robitussin DM Cards: carotid artery stenosis, HTN, HLD - VSS, NSR on Lopressor, Lipitor, diltiazem gtt Hepatobil: alk phos/AST elevated, tbili / TG WNL Neuro: depression, anxiety.  Pain score 6, PRN morphine ID: Vanc/Primaxin/Fluc for E.coli abscess and possible necrotizing pancreatitis - afebrile, WBC up 16 Best Practices: SCDs, mouth care TPN Access: PICC placed 05/03/16 TPN start date: 05/05/16  Nutritional Goals (per RD recommendation on 11/25): 1900-2100 kCal and 100-125 g of protein per day  Current Nutrition:  Clinimix = 845 kCal and 48 mg protein per day Jevity 1.2 at 30 ml/hr = 864 kCal and 40mg per day   Plan:  - Continue Clinimix E 5/20 at 40 ml/hr.  Hold lipids since  patient is on tube feed.   - Jevity 1.2 at 30 ml/hr per MD - TPN and TF provide 1709 kCal and 88gm of protein per day, meeting 90% of kCal and 88% of protein needs. - Daily multivitamin and trace elements in TPN - Continue with 20 units of regular insulin in TPN and moderate SSI Q4H - F/U with TF resumption/advancement to D/C TPN    D. , PharmD, BCPS Pager:  [phone] 05/13/2016, 10:16 AM    

## 2016-05-13 NOTE — Progress Notes (Signed)
PROGRESS NOTE  [name]  ZOX:[id number] DOB: [birth year] DOA: 05/02/2016 PCP: [name], MD Outpatient Specialists:  Subjective: Ambulated to day, no bm yet    Brief Narrative:  Colin West is a 82 y.o. male  with complex history recently hospitalized from 10/8-10/12 for hemorrhagic pancreatitis from gallblader source, and on  11/7-11/13 for Severe Sepsis due to E.Coli pyelonephritis, discharged to  [facility] on oral antibiotics, brought to the emergency department with worsening generalized weakness, 11/17    with  associated nausea and vomiting.   lost about 15 lbs in the last 2 months.  CT scan shows multiple abscesses although not very well-developed, lots of air bubbles.  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 -CT scan showed multiple peripancreatic abscesses, was recently in the hospital from 10/8-10/12 for hemorrhagic pancreatitis from gallbladder source. -Currently on Primaxin and Diflucan for pancreatitis, vancomycin for possible HCAP, Pancreatic abscess aspiration now growing Escherichia coli  . Surgery recommends to continue Primaxin -Patient developed pancreatic necrosis while he was eating, need to be strict NPO with postpyloric feeding. Continue TPN Patient tube feeds increased to 30 ml/hr and TPN reduced. Patient tolerating.  -As per surgery, patient's  nutritional and functional status needs to improve prior to consider surgery which seems unlikely. -patient refuses to go back to SNF in the interval that he is waiting for surgery Per surgery CT shows no sign of cholecystitis or even any sign of cholelithiasis. Laparoscopic cholecystectomy at this time will do nothing to improve his pancreatitis/ pancreatic abscesses Cardiology reconsulted 11/22-if patient requires laparoscopic surgery-moderate risk, if patient requires open surgery, it would be considered high risk, at this point cannot rule out ischemia, no further cardiac testing recommended. Continue perioperative beta blocker. Proceed with surgery if indicated Repeat 2-D echo shows mild improvement in EF to 35-40%. Continue   beta blocker No BM in the last couple of days, will give low dose of Dulcolax suppository Discussed with Dr Dwain Sarna  11/27 and conveyed to the daughter about IR recommendations to drain Complex fluid adjacent to the pancreas     Cough /probable HCAP Suspect developing  pna chest x-ray shows left basilar atelectasis versus pneumonia Continue vancomycin/imipenem Counseled RN to maintain strict aspiration precautions and pulmonary toilet Leukocytosis improving   Protein calorie malnutrition, severe  -Lost 20 pounds in the past 2 months, albumin continues to decline -Per general surgery recommendation try postpyloric enteral nutrition prior to TPN. Currently on both TPN and enteric feeding held because of ileus, now resumed -CORTRAK, in place  Atrial fibrillation , paroxysmal -Transient atrial fibrillation with heart rate 130s, back to sinus rhythm with IV/by mouth metoprolol. Continue oral metoprolol -Not sure if he needs anticoagulation for transient transient A. fib.  TSH  2.319 Cardiology has intermittently followed the patient during this hospitalization and during previous hospitalizations   Type II Diabetes Current blood sugar level is 174 Currently  nothing by mouth, per general surgery  once into postpyloric feeding.     Hypertension stable Controlled Continue home anti-hypertensive medications    Hiccups This is likely secondary to the irritation of the diaphragm from pancreatic abscess, on Reglan.  Hyperlipidemia Continue home statins   Dilated Cardiomyopathy in view of recent sepsis 2 D echo recently shows EF 30-35 % 11/10,   repeat echo unchanged Cautiously monitor fluid status   ?Dementia/encephalopathic   Minimize narcotics     DVT prophylaxis:  Code Status: Full Code Family Communication:  Discussed with son and brother by the bedside Disposition Plan: refusing SNF,  Not stable for DC   Diet: TPN (CLINIMIX-E) Adult Diet NPO time specified Except for: Sips with Meds  Consultants:   Gen. surgery.  Interventional radiology  Cardiology  Procedures:   Aspiration of abscess on 05/04/2016  Antibiotics  Imipenem 11/18-  Vancomycin 11/24-  Fluconazole 11/20-    Objective: Vitals:   05/13/16 0000 05/13/16 0400 05/13/16 0750 05/13/16 0928  BP: 123/68 114/60 118/62 131/75  Pulse: 73 73 80 88  Resp: 16 15 18    Temp: 98.3 F (36.8 C) 97 F (36.1 C) 97.7 F (36.5 C)   TempSrc: Oral Axillary Oral   SpO2: 97% 98% 100%   Weight:  86.6 kg (191 lb)    Height:        Intake/Output Summary (Last 24 hours) at 05/13/16 0948 Last data filed at 05/13/16 0800  Gross per 24 hour  Intake              720 ml  Output             1100 ml  Net             -380 ml   Filed Weights   05/11/16 0419 05/12/16 0337 05/13/16 0400  Weight: 79.8 kg (176 lb) 80.7 kg (178 lb) 86.6 kg (191 lb)    Examination: General exam: Appears calm and comfortable  Respiratory system: Clear to auscultation. Respiratory effort normal. Cardiovascular system: S1 & S2 heard, RRR. No JVD, murmurs, rubs, gallops or clicks. No pedal edema. Gastrointestinal system: Abdomen is nondistended, soft and nontender. No organomegaly or  masses felt. Normal bowel sounds heard. Central nervous system: Alert and oriented. No focal neurological deficits. Extremities: Symmetric 5 x 5 power. Skin: No rashes, lesions or ulcers Psychiatry: Judgement and insight appear normal. Mood & affect appropriate.   Data Reviewed: I have personally reviewed following labs and imaging studies  CBC:  Recent Labs Lab 05/08/16 0415 05/10/16 0229 05/12/16 0541  WBC 16.3* 14.1* 16.0*  NEUTROABS  --   --  10.9*  HGB 11.3* 10.8* 10.5*  HCT 34.8* 32.2* 32.9*  MCV 96.1 95.0 96.5  PLT 190 171 192   Basic Metabolic Panel:  Recent Labs Lab 05/07/16 0425 05/08/16 0415 05/09/16 0250 05/10/16 0229 05/12/16 0541  NA 137 137 138 135 134*  K 3.5 3.9 4.0 3.7 3.8  CL 106 107 105 105 103  CO2 25 27 26 25 28   GLUCOSE 150* 189* 117* 112* 154*  BUN 18 21* 22* 20 21*  CREATININE 0.51* 0.58* 0.64 0.64 0.86  CALCIUM 8.0* 8.0* 8.1* 8.0* 8.1*  MG  --  1.9  --  1.8 1.9  PHOS 2.8 2.4* 2.6 3.2 3.0   GFR: Estimated Creatinine Clearance: 74.7 mL/min (by C-G formula based on SCr of 0.86 mg/dL). Liver Function Tests:  Recent Labs Lab 05/08/16 0415 05/10/16 0229 05/12/16 0541  AST 155* 53* 45*  ALT 53 43 39  ALKPHOS 317* 257* 299*  BILITOT 0.4 0.6 0.7  PROT 4.9* 4.9* 5.4*  ALBUMIN 1.6* 1.6* 1.5*    Recent Labs Lab 05/10/16 0229  LIPASE 18    Recent Labs Lab 05/11/16 0223  AMMONIA 19   Coagulation Profile:  Recent Labs Lab 05/12/16 1415  INR 1.28   Cardiac Enzymes: No results for input(s): CKTOTAL, CKMB, CKMBINDEX, TROPONINI in the last 168 hours. BNP (last 3 results) No results for input(s): PROBNP in the last 8760 hours. HbA1C: No results for input(s): HGBA1C in the last 72 hours. CBG:  Recent Labs Lab 05/12/16 1709 05/12/16 2022 05/13/16 0050 05/13/16 0408 05/13/16 0749  GLUCAP 174* 176* 153* 149* 131*   Lipid Profile:  Recent Labs  05/12/16 0541  TRIG 41   Thyroid Function Tests: No results for  input(s): TSH, T4TOTAL, FREET4, T3FREE, THYROIDAB in the last 72 hours. Anemia Panel: No results for input(s): VITAMINB12, FOLATE, FERRITIN, TIBC, IRON, RETICCTPCT in the last 72 hours. Urine analysis:    Component Value Date/Time   COLORURINE AMBER (A) 05/02/2016 1149   APPEARANCEUR CLOUDY (A) 05/02/2016 1149   LABSPEC 1.024 05/02/2016 1149   PHURINE 6.0 05/02/2016 1149   GLUCOSEU NEGATIVE 05/02/2016 1149   HGBUR NEGATIVE 05/02/2016 1149   BILIRUBINUR SMALL (A) 05/02/2016 1149   KETONESUR NEGATIVE 05/02/2016 1149   PROTEINUR NEGATIVE 05/02/2016 1149   UROBILINOGEN 0.2 03/08/2010 1026   NITRITE NEGATIVE 05/02/2016 1149   LEUKOCYTESUR SMALL (A) 05/02/2016 1149   Sepsis Labs: @LABRCNTIP (procalcitonin:4,lacticidven:4)  ) Recent Results (from the past 240 hour(s))  Aerobic/Anaerobic Culture (surgical/deep wound)     Status: None   Collection Time: 05/03/16  2:10 PM  Result Value Ref Range Status   Specimen Description DRAINAGE  Final   Special Requests Normal  Final   Gram Stain   Final    ABUNDANT WBC PRESENT, PREDOMINANTLY PMN MODERATE GRAM NEGATIVE RODS    Culture MODERATE ESCHERICHIA COLI NO ANAEROBES ISOLATED   Final   Report Status 05/08/2016 FINAL  Final   Organism ID, Bacteria ESCHERICHIA COLI  Final      Susceptibility   Escherichia coli - MIC*    AMPICILLIN >=32 RESISTANT Resistant     CEFAZOLIN <=4 SENSITIVE Sensitive     CEFEPIME <=1 SENSITIVE Sensitive     CEFTAZIDIME <=1 SENSITIVE Sensitive     CEFTRIAXONE <=1 SENSITIVE Sensitive     CIPROFLOXACIN >=4 RESISTANT Resistant     GENTAMICIN <=1 SENSITIVE Sensitive     IMIPENEM <=0.25 SENSITIVE Sensitive     TRIMETH/SULFA >=320 RESISTANT Resistant     AMPICILLIN/SULBACTAM >=32 RESISTANT Resistant     PIP/TAZO <=4 SENSITIVE Sensitive     Extended ESBL NEGATIVE Sensitive     * MODERATE ESCHERICHIA COLI     Invalid input(s): PROCALCITONIN, LACTICACIDVEN   Radiology Studies: Dg Chest Port 1 View  Result  Date: 05/12/2016 CLINICAL DATA:  Cough, history of hypertension, hyperlipidemia, and diabetes. EXAM: PORTABLE CHEST 1 VIEW COMPARISON:  Portable chest x-ray of May 08, 2016 FINDINGS: The lung volumes remain low. There is increased density at the left lung base with new obscuration of the hemidiaphragm. The heart and pulmonary vascularity are normal. There is tortuosity of the ascending and descending thoracic aorta. The PICC line tip on the right projects over the midportion of the SVC. The feeding tube tip projects below the inferior margin of the image beyond the second portion of the duodenum. IMPRESSION: Increased density at the left lung base consistent with atelectasis or pneumonia with  small left pleural effusion. Stable bilateral hypo nflation. Electronically Signed   By: David  [place] M.D.   On: 05/12/2016 09:27        Scheduled Meds: . atorvastatin  10 mg Oral q1800  . bisacodyl  5 mg Rectal Once  . chlorhexidine  15 mL Mouth Rinse BID  . fluconazole (DIFLUCAN) IV  100 mg Intravenous Q24H  . guaiFENesin-dextromethorphan  5 mL Oral Q8H  . imipenem-cilastatin  500 mg Intravenous Q6H  . insulin aspart  0-15 Units Subcutaneous Q4H  . lidocaine      . lipase/protease/amylase  36,000 Units Oral TID AC  . mouth rinse  15 mL Mouth Rinse q12n4p  . metoprolol  100 mg Oral BID  . pantoprazole (PROTONIX) IV  40 mg Intravenous Q12H  . sodium chloride flush  10-40 mL Intracatheter Q12H  . sodium chloride flush  3 mL Intravenous Q12H  . vancomycin  1,000 mg Intravenous Q12H   Continuous Infusions: . sodium chloride 10 mL/hr at 05/09/16 1039  . diltiazem (CARDIZEM) infusion    . feeding supplement (JEVITY 1.2 CAL) 1,000 mL (05/12/16 0345)  . Marland KitchenTPN (CLINIMIX-E) Adult 40 mL/hr at 05/12/16 1720     LOS: 11 days    Time spent: 35 minutes    Richarda Overlie, MD [facility] Pager [phone]  If 7PM-7AM, please contact night-coverage [email or web address] Password [facility] 05/13/2016,  9:48 AM

## 2016-05-13 NOTE — Sedation Documentation (Signed)
Patient is resting comfortably. 

## 2016-05-13 NOTE — Progress Notes (Signed)
Physical Therapy Treatment Patient Details Name: Colin West MRN: [id number] DOB: 1934/06/15 Today's Date: 05/13/2016    History of Present Illness Pt readmitted with pancreatic abcessess. Pt with 2 recent admissions for hemorrhagic gallstone pancreatitis and sepsis. PMH - HTN, DM    PT Comments    Pt improving with mobility and activity tolerance despite continued medical issues. Continue to feel he will need SNF at DC.  Follow Up Recommendations  SNF     Equipment Recommendations  None recommended by PT    Recommendations for Other Services       Precautions / Restrictions Precautions Precautions: Fall Restrictions Weight Bearing Restrictions: No    Mobility  Bed Mobility Overal bed mobility: Needs Assistance Bed Mobility: Supine to Sit     Supine to sit: HOB elevated;Min assist     General bed mobility comments: Assist to elevate trunk into sitting  Transfers Overall transfer level: Needs assistance Equipment used: Rolling walker (2 wheeled) Transfers: Sit to/from Stand Sit to Stand: +2 physical assistance;Min assist         General transfer comment: Assist to bring hips up and for balance  Ambulation/Gait Ambulation/Gait assistance: Min assist;+2 safety/equipment Ambulation Distance (Feet): 165 Feet Assistive device: Rolling walker (2 wheeled) Gait Pattern/deviations: Step-through pattern;Decreased stride length;Trunk flexed Gait velocity: too fast for good control   General Gait Details: Assist for balance and support. Followed closely by second person with a chair. Verbal cues to stay closer to the walker and stand more erect. Verbal cues also to slow down.   Stairs            Wheelchair Mobility    Modified Rankin (Stroke Patients Only)       Balance Overall balance assessment: Needs assistance Sitting-balance support: No upper extremity supported Sitting balance-Leahy Scale: Fair     Standing balance support: Bilateral upper  extremity supported Standing balance-Leahy Scale: Poor Standing balance comment: walker and min A for static standing                    Cognition Arousal/Alertness: Awake/alert Behavior During Therapy: WFL for tasks assessed/performed;Flat affect Overall Cognitive Status: Within Functional Limits for tasks assessed                      Exercises      General Comments        Pertinent Vitals/Pain Pain Assessment: No/denies pain    Home Living                      Prior Function            PT Goals (current goals can now be found in the care plan section) Progress towards PT goals: Progressing toward goals    Frequency    Min 3X/week      PT Plan Current plan remains appropriate    Co-evaluation             End of Session Equipment Utilized During Treatment: Gait belt Activity Tolerance: Patient tolerated treatment well Patient left: in chair;with call bell/phone within reach;with chair alarm set     Time: [id number] PT Time Calculation (min) (ACUTE ONLY): 25 min  Charges:  $Gait Training: 23-37 mins                    G CodesAngelina West:      Colin West 05/13/2016, 9:26 AM Colin West PT [phone]

## 2016-05-13 NOTE — Progress Notes (Signed)
Subjective: Passing flatus, feels a little better, walked a lot this am  Objective: Vital signs in last 24 hours: Temp:  [97 F (36.1 C)-98.9 F (37.2 C)] 97.7 F (36.5 C) (11/28 0750) Pulse Rate:  [73-89] 88 (11/28 0928) Resp:  [15-19] 18 (11/28 0750) BP: (113-139)/(60-75) 131/75 (11/28 0928) SpO2:  [96 %-100 %] 100 % (11/28 0750) Weight:  [86.6 kg (191 lb)] 86.6 kg (191 lb) (11/28 0400) Last BM Date: 04/17/16  Intake/Output from previous day: 11/27 0701 - 11/28 0700 In: 780 [I.V.:180; IV Piggyback:600] Out: 1475 [Urine:1475] Intake/Output this shift: Total I/O In: 50 [I.V.:50] Out: -   GI: much less distended, soft nontender  Lab Results:   Recent Labs  05/12/16 0541  WBC 16.0*  HGB 10.5*  HCT 32.9*  PLT 192   BMET  Recent Labs  05/12/16 0541  NA 134*  K 3.8  CL 103  CO2 28  GLUCOSE 154*  BUN 21*  CREATININE 0.86  CALCIUM 8.1*   PT/INR  Recent Labs  05/12/16 1415  LABPROT 16.1*  INR 1.28   ABG No results for input(s): PHART, HCO3 in the last 72 hours.  Invalid input(s): PCO2, PO2  Studies/Results: Dg Chest Port 1 View  Result Date: 05/12/2016 CLINICAL DATA:  Cough, history of hypertension, hyperlipidemia, and diabetes. EXAM: PORTABLE CHEST 1 VIEW COMPARISON:  Portable chest x-ray of May 08, 2016 FINDINGS: The lung volumes remain low. There is increased density at the left lung base with new obscuration of the hemidiaphragm. The heart and pulmonary vascularity are normal. There is tortuosity of the ascending and descending thoracic aorta. The PICC line tip on the right projects over the midportion of the SVC. The feeding tube tip projects below the inferior margin of the image beyond the second portion of the duodenum. IMPRESSION: Increased density at the left lung base consistent with atelectasis or pneumonia with small left pleural effusion. Stable bilateral hypo nflation. Electronically Signed   By: David  [place]aziland[place] M.D.   On: 05/12/2016  09:27    Anti-infectives: Anti-infectives    Start     Dose/Rate Route Frequency Ordered Stop   05/12/16 0000  vancomycin (VANCOCIN) IVPB 1000 mg/200 mL premix     1,000 mg 200 mL/hr over 60 Minutes Intravenous Every 12 hours 05/11/16 1322     05/11/16 1315  vancomycin (VANCOCIN) 500 mg in sodium chloride 0.9 % 100 mL IVPB     500 mg 100 mL/hr over 60 Minutes Intravenous STAT 05/11/16 1304 05/11/16 1558   05/10/16 1200  imipenem-cilastatin (PRIMAXIN) 500 mg in sodium chloride 0.9 % 100 mL IVPB     500 mg 200 mL/hr over 30 Minutes Intravenous Every 6 hours 05/10/16 0846     05/09/16 1200  cefTRIAXone (ROCEPHIN) 2 g in dextrose 5 % 50 mL IVPB  Status:  Discontinued     2 g 100 mL/hr over 30 Minutes Intravenous Every 24 hours 05/09/16 0801 05/09/16 1050   05/09/16 1200  cefTRIAXone (ROCEPHIN) 1 g in dextrose 5 % 50 mL IVPB  Status:  Discontinued     1 g 100 mL/hr over 30 Minutes Intravenous Every 24 hours 05/09/16 1050 05/10/16 0833   05/09/16 1100  vancomycin (VANCOCIN) IVPB 750 mg/150 ml premix  Status:  Discontinued     750 mg 150 mL/hr over 60 Minutes Intravenous Every 12 hours 05/09/16 1051 05/11/16 1322   05/05/16 1230  fluconazole (DIFLUCAN) IVPB 100 mg     100 mg 50 mL/hr over 60 Minutes Intravenous  Every 24 hours 05/05/16 1135     05/03/16 1200  imipenem-cilastatin (PRIMAXIN) 500 mg in sodium chloride 0.9 % 100 mL IVPB  Status:  Discontinued     500 mg 200 mL/hr over 30 Minutes Intravenous Every 6 hours 05/03/16 1122 05/09/16 0801   05/03/16 0030  piperacillin-tazobactam (ZOSYN) IVPB 3.375 g  Status:  Discontinued     3.375 g 12.5 mL/hr over 240 Minutes Intravenous Every 8 hours 05/02/16 1647 05/03/16 1101   05/02/16 1930  fluconazole (DIFLUCAN) tablet 100 mg  Status:  Discontinued     100 mg Oral Daily 05/02/16 1816 05/05/16 1135   05/02/16 1600  piperacillin-tazobactam (ZOSYN) IVPB 3.375 g     3.375 g 100 mL/hr over 30 Minutes Intravenous  Once 05/02/16 1548 05/02/16 1720       Assessment/Plan: Gallstone pancreatitiswith infected pancreatic necrosis  Continue tube feeds, ir to attempt drainage today, I think with time course this is most reasonable option right now.  I think endoscopic drainage or possible surgical rp drainage are still possible but will attempt this first Cont abx   [facility] CenterWAKEFIELD,[place] Tab[place] 05/13/2016

## 2016-05-13 NOTE — Procedures (Signed)
Interventional Radiology Procedure Note  Procedure:  CT guided drainage of infected pancreatic pseudocyst  Complications:  None  Estimated Blood Loss: < 10 mL  Via left lateral RP approach, 14 Fr drain placed into infected pancreatic pseudocyst, terminating in region of mid body of pancreas.  Draining thick, purulent fluid with blood-tinged debris.  Sample sent for culture.  Drain attached to suction bulb.  Jodi MarbleGlenn T. Fredia SorrowYamagata, M.D Pager:  [phone]

## 2016-05-13 NOTE — Sedation Documentation (Signed)
Pt is resting at this time. No complaints

## 2016-05-13 NOTE — Sedation Documentation (Signed)
Patient is sleeping, tolerating procedure well at this time.

## 2016-05-14 LAB — CBC
HCT: 32.8 % — ABNORMAL LOW (ref 39.0–52.0)
Hemoglobin: 10.7 g/dL — ABNORMAL LOW (ref 13.0–17.0)
MCH: 31.2 pg (ref 26.0–34.0)
MCHC: 32.6 g/dL (ref 30.0–36.0)
MCV: 95.6 fL (ref 78.0–100.0)
PLATELETS: 213 10*3/uL (ref 150–400)
RBC: 3.43 MIL/uL — ABNORMAL LOW (ref 4.22–5.81)
RDW: 14.5 % (ref 11.5–15.5)
WBC: 18.4 10*3/uL — ABNORMAL HIGH (ref 4.0–10.5)

## 2016-05-14 LAB — COMPREHENSIVE METABOLIC PANEL
ALT: 32 U/L (ref 17–63)
ANION GAP: 4 — AB (ref 5–15)
AST: 35 U/L (ref 15–41)
Albumin: 1.5 g/dL — ABNORMAL LOW (ref 3.5–5.0)
Alkaline Phosphatase: 261 U/L — ABNORMAL HIGH (ref 38–126)
BUN: 16 mg/dL (ref 6–20)
CHLORIDE: 99 mmol/L — AB (ref 101–111)
CO2: 28 mmol/L (ref 22–32)
CREATININE: 0.75 mg/dL (ref 0.61–1.24)
Calcium: 8.1 mg/dL — ABNORMAL LOW (ref 8.9–10.3)
Glucose, Bld: 153 mg/dL — ABNORMAL HIGH (ref 65–99)
Potassium: 4.5 mmol/L (ref 3.5–5.1)
SODIUM: 131 mmol/L — AB (ref 135–145)
Total Bilirubin: 0.9 mg/dL (ref 0.3–1.2)
Total Protein: 5 g/dL — ABNORMAL LOW (ref 6.5–8.1)

## 2016-05-14 LAB — GLUCOSE, CAPILLARY
GLUCOSE-CAPILLARY: 122 mg/dL — AB (ref 65–99)
GLUCOSE-CAPILLARY: 143 mg/dL — AB (ref 65–99)
GLUCOSE-CAPILLARY: 146 mg/dL — AB (ref 65–99)
GLUCOSE-CAPILLARY: 168 mg/dL — AB (ref 65–99)
Glucose-Capillary: 136 mg/dL — ABNORMAL HIGH (ref 65–99)
Glucose-Capillary: 123 mg/dL — ABNORMAL HIGH (ref 65–99)

## 2016-05-14 MED ORDER — TRACE MINERALS CR-CU-MN-SE-ZN 10-1000-500-60 MCG/ML IV SOLN
INTRAVENOUS | Status: DC
  Administered 2016-05-14: 17:00:00 via INTRAVENOUS
  Filled 2016-05-14: qty 960

## 2016-05-14 NOTE — Progress Notes (Signed)
Referring Physician(s): Darnelle SpangleWakefield,M  Supervising Physician: Irish LackYamagata, Glenn  [facility]  Chief Complaint:  Pancreatic pseudocyst  Subjective:  Pt doing ok; feels sl better; does have some flank discomfort at drain site  Allergies: Patient has no known allergies.  Medications: Prior to Admission medications   Medication Sig Start Date End Date Taking? Authorizing Provider  amoxicillin-clavulanate (AUGMENTIN) 875-125 MG tablet Take 1 tablet by mouth every 12 (twelve) hours. 04/28/16  Yes Marinda ElkAbraham Feliz Ortiz, MD  atorvastatin (LIPITOR) 10 MG tablet Take 10 mg by mouth daily.   Yes Historical Provider, MD  famotidine (PEPCID) 20 MG tablet Take 1 tablet (20 mg total) by mouth 2 (two) times daily. 04/07/16  Yes Osvaldo ShipperGokul Krishnan, MD  feeding supplement, GLUCERNA SHAKE, (GLUCERNA SHAKE) LIQD Take 237 mLs by mouth 3 (three) times daily between meals. 04/07/16  Yes Osvaldo ShipperGokul Krishnan, MD  lipase/protease/amylase (CREON) 36000 UNITS CPEP capsule Take 1 capsule (36,000 Units total) by mouth 3 (three) times daily before meals. 04/07/16  Yes Osvaldo ShipperGokul Krishnan, MD  lisinopril (PRINIVIL,ZESTRIL) 2.5 MG tablet Take 1 tablet (2.5 mg total) by mouth daily. 04/29/16  Yes Marinda ElkAbraham Feliz Ortiz, MD  metFORMIN (GLUCOPHAGE-XR) 500 MG 24 hr tablet Take 500 mg by mouth daily with breakfast.   Yes Historical Provider, MD  metoCLOPramide (REGLAN) 5 MG tablet Take 5-10 mg by mouth every 8 (eight) hours as needed (for hiccups).   Yes Historical Provider, MD  metoprolol (LOPRESSOR) 100 MG tablet Take 1 tablet (100 mg total) by mouth 2 (two) times daily. 04/28/16  Yes Marinda ElkAbraham Feliz Ortiz, MD  ondansetron (ZOFRAN) 4 MG tablet Take 4 mg by mouth every 8 (eight) hours as needed for nausea or vomiting.   Yes Historical Provider, MD  potassium chloride SA (K-DUR,KLOR-CON) 20 MEQ tablet Take 1 tablet (20 mEq total) by mouth daily. 04/07/16  Yes Osvaldo ShipperGokul Krishnan, MD  traMADol (ULTRAM) 50 MG tablet Take 1 tablet  (50 mg total) by mouth every 6 (six) hours as needed for moderate pain. 04/28/16  Yes Marinda ElkAbraham Feliz Ortiz, MD     Vital Signs: BP 108/66   Pulse 77   Temp 98.8 F (37.1 C) (Oral)   Resp 16   Ht 5\' 9"  (1.753 m)   Wt (!) 412 lb 4.2 oz (187 kg)   SpO2 96%   BMI 60.88 kg/m   Physical Exam left RP drain intact, dressing dry, sl tender insertion site, output 130 cc turbid light brown fluid; cx's pend  Imaging: Dg Chest Port 1 View  Result Date: 05/12/2016 CLINICAL DATA:  Cough, history of hypertension, hyperlipidemia, and diabetes. EXAM: PORTABLE CHEST 1 VIEW COMPARISON:  Portable chest x-ray of May 08, 2016 FINDINGS: The lung volumes remain low. There is increased density at the left lung base with new obscuration of the hemidiaphragm. The heart and pulmonary vascularity are normal. There is tortuosity of the ascending and descending thoracic aorta. The PICC line tip on the right projects over the midportion of the SVC. The feeding tube tip projects below the inferior margin of the image beyond the second portion of the duodenum. IMPRESSION: Increased density at the left lung base consistent with atelectasis or pneumonia with small left pleural effusion. Stable bilateral hypo nflation. Electronically Signed   By: David  SwazilandJordan M.D.   On: 05/12/2016 09:27   Dg Abd Portable 1v  Result Date: 05/10/2016 CLINICAL DATA:  NG feeding tube placement EXAM: PORTABLE ABDOMEN - 1 VIEW COMPARISON:  None. FINDINGS: Mild gaseous distended  small bowel loops in mid abdomen probable mild ileus. There is NG feeding tube with tip in proximal jejunum. IMPRESSION: NG feeding tube with tip in proximal jejunum. Electronically Signed   By: Natasha MeadLiviu  Pop M.D.   On: 05/10/2016 13:28   Ct Image Guided Drainage By Percutaneous Catheter  Result Date: 05/13/2016 CLINICAL DATA:  Severe pancreatitis with infected necrosis of the entire pancreas and formation of infected pseudocyst. Referral for percutaneous drainage of  infected pseudocyst given high risk currently for surgical debridement. EXAM: CT GUIDED CATHETER DRAINAGE OF RETROPERITONEAL PANCREATIC ABSCESS ANESTHESIA/SEDATION: 1.0 mg IV Versed 25 mcg IV Fentanyl Total Moderate Sedation Time:  25 minutes The patient's level of consciousness and physiologic status were continuously monitored during the procedure by Radiology nursing. PROCEDURE: The procedure, risks, benefits, and alternatives were explained to the patient. Questions regarding the procedure were encouraged and answered. The patient understands and consents to the procedure. A time out was performed prior to initiating the procedure. The left lateral abdominal wall was prepped with chlorhexidine in a sterile fashion, and a sterile drape was applied covering the operative field. A sterile gown and sterile gloves were used for the procedure. Local anesthesia was provided with 1% Lidocaine. CT was performed. After localizing a far left lateral retroperitoneal approach to the pancreas, an 18 gauge trocar needle was advanced under CT fluoroscopic guidance to the level of the infected pancreatic pseudocyst. After confirming needle tip position, a guidewire was advanced. The tract was dilated and a 14 French pigtail drainage catheter advanced. The catheter was formed and aspirated. A fluid sample was sent for culture analysis. The catheter was secured at the skin with a Prolene retention suture and StatLock device. It was connected to suction bulb drainage. COMPLICATIONS: None FINDINGS: Drainage catheter course was along the rough course of the tail of the pancreas with the drain terminating in the region of the body of the pancreas. The necrotic pancreas and infected pseudocyst yielded blood tinged, purulent fluid with debris. A sample was sent for culture analysis. IMPRESSION: CT-guided drainage of large region of pancreatic necrosis and infected pseudocyst formation. A 14 French percutaneous drain was placed from a  left lateral retroperitoneal approach. The drain was attached to suction bulb drainage. A fluid sample was sent for culture analysis. Electronically Signed   By: Irish LackGlenn  Yamagata M.D.   On: 05/13/2016 16:55    Labs:  CBC:  Recent Labs  05/08/16 0415 05/10/16 0229 05/12/16 0541 05/14/16 0229  WBC 16.3* 14.1* 16.0* 18.4*  HGB 11.3* 10.8* 10.5* 10.7*  HCT 34.8* 32.2* 32.9* 32.8*  PLT 190 171 192 213    COAGS:  Recent Labs  03/27/16 0500 04/23/16 0507 05/03/16 0242 05/12/16 1415  INR 1.23 1.39 1.43 1.28    BMP:  Recent Labs  05/09/16 0250 05/10/16 0229 05/12/16 0541 05/14/16 0229  NA 138 135 134* 131*  K 4.0 3.7 3.8 4.5  CL 105 105 103 99*  CO2 26 25 28 28   GLUCOSE 117* 112* 154* 153*  BUN 22* 20 21* 16  CALCIUM 8.1* 8.0* 8.1* 8.1*  CREATININE 0.64 0.64 0.86 0.75  GFRNONAA >60 >60 >60 >60  GFRAA >60 >60 >60 >60    LIVER FUNCTION TESTS:  Recent Labs  05/08/16 0415 05/10/16 0229 05/12/16 0541 05/14/16 0229  BILITOT 0.4 0.6 0.7 0.9  AST 155* 53* 45* 35  ALT 53 43 39 32  ALKPHOS 317* 257* 299* 261*  PROT 4.9* 4.9* 5.4* 5.0*  ALBUMIN 1.6* 1.6* 1.5* 1.5*  Assessment and Plan: S/p drainage of infected panc pseudocyst 11/28; AF; WBC 18.4(16.0), hgb 10.7(10.5), creat ok; drain fluid cx's pend- gm neg rods; cont current tx/drain irrigation; additional plans as per CCS/IM; f/u CT once drain output minimal   Electronically Signed: D. Jeananne Rama 05/14/2016, 11:29 AM   I spent a total of 15 minutes at the the patient's bedside AND on the patient's hospital floor or unit, greater than 50% of which was counseling/coordinating care for pancreatic pseudocyst drain    Patient ID: Colin West, male   DOB: 21-Dec-1933, 82 y.o.   MRN: [id number]

## 2016-05-14 NOTE — Progress Notes (Signed)
PROGRESS NOTE  Colin West HasGraham N West GEX:[id number] DOB: Nov 25, 1933 DOA: 05/02/2016 PCP: Verl BangsADIONTCHENKO, ALEXEI, MD   LOS: 12 days   Brief Narrative: Glorious PeachGraham N Fuquayis a 82 y.o.malewith complex history recently hospitalized from 10/8-10/12 for hemorrhagic pancreatitis from gallblader source, and on 11/7-11/13 for Severe Sepsis due to E.Coli pyelonephritis, discharged to [facility] on oral antibiotics, brought to the emergency department with worsening generalized weakness, 11/17   with associated nausea and vomiting.   lost about 15 lbs in the last 2 months. CT scan shows multiple abscesses although not very well-developed, lots of air bubbles. Possible infected pancreatic necrosis. Patient found to have multiple peripancreatic abscesses, now scheduled for IR CT-guided drain placement, received drainage by IR x 2 on 11/18 and 11/28.  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 - CT scan showed multiple peripancreatic abscesses, was recently in the hospital from 10/8-10/12 for hemorrhagic pancreatitis from gallbladder source. - Currently on Primaxin and Diflucan for pancreatitis, vancomycin for possible HCAP, - Pancreatic abscess aspiration now growing Escherichia coli.  - General surgery recommends to continue Primaxin - Patient developed pancreatic necrosis while he was eating, need to be strict NPO with postpyloric feeding. Continue TPN as well per pharmacy - WBC increasing today,  monitor  Patient tube feeds increased to 30 ml/hr and TPN reduced. Patient tolerating.  - As per surgery, patient's nutritional and functional status needs to improve prior to consider surgery which seems unlikely. - patient refuses to go back to SNF in the interval that he is waiting for surgery - Per surgery CT showsno sign of cholecystitis or even any sign of cholelithiasis. Laparoscopic cholecystectomy at this time will do nothing to improve his pancreatitis/ pancreatic abscesses - Cardiology reconsulted 11/22 - if patient requires laparoscopic surgery - moderate risk, if patient requires open surgery, it would be considered high risk, at this point cannot rule out ischemia, though no further cardiac testing recommended. Continue beta blocker. Proceed with surgery if indicated - Repeat 2-D echo shows mild improvement in EF to 35-40%.  - d/w general surgery today   Cough /probable HCAP - Suspect developing pna chest x-ray shows left basilar atelectasis versus pneumonia - Continue vancomycin/imipenem, d/c Vanc in 24 h - Counseled RN to maintain strict aspiration precautions and pulmonary toilet, leukocytosis improving  Protein calorie malnutrition, severe  - Lost 20 pounds in the past 2 months, albumin continues to decline - Per general surgery recommendation try postpyloric enteral nutrition prior to TPN. - Currently on both TPN and enteric feeding held because of ileus, now resumed - CORTRAK, in place  Atrial fibrillation, paroxysmal - Transient atrial fibrillation with heart rate 130s, back to sinus rhythm with IV/by mouth metoprolol. Continue oral metoprolol - TSH  2.319 - Cardiology has intermittently followed the patient during this hospitalization and during previous hospitalizations, defer anticoagulation to them   Type II Diabetes  - CBGs controlled 120-140s  Hypertension stable - Controlled - Continue home anti-hypertensive medications   Hiccups - This is likely  secondary to the irritation of the diaphragm from  pancreatic abscess, on Reglan. Stable today   Hyperlipidemia - Continue home statins  Chronic systolic CHF in the setting of dilated Cardiomyopathy in view of recent sepsis - 2 D echo recently shows EF 30-35 % 11/10, repeat echo unchanged - Cautiously monitor fluid status, monitor weights, appears to have gained 1lb. H is up 16 L, will give Lasix tomorrow if he continues to gain weight  ?Dementia/encephalopathic - Minimize narcotics   DVT prophylaxis: SCDs Code Status: Full code Family Communication: no family at bedside Disposition Plan: refusing SNF, not ready for D/C  Consultants:   General surgery   Interventional Radiology  Cardiology  Procedures:   2D echo 05/08/16 Impressions: - Mild LVH with LVEF approximately 35-40%, diffuse hypokinesis. Grade 1 diastolic dysfunction. Mitral annular calcification with mild mitral regurgitation. Mildly sclerotic aortic valve. Trivial tricuspid regurgitation.  Peripancreatic fluid collection aspiration 11/18 >> growing E coli (resistant with Ampicillin and bactrim)  CT-guided drainage of large region of pancreatic necrosis and infected pseudocyst formation. A 14 French 11/28 >> preliminary growing Gram negative rods   Antimicrobials:  Imipenem 11/18 >>   Vancomycin 11/24 >>  Fluconazole 11/20 >>   Subjective: - complains of left flank pain where drain is in place  Objective: Vitals:   05/14/16 1000 05/14/16 1100 05/14/16 1200 05/14/16 1212  BP: 108/66 117/77 124/62 124/62  Pulse: 77  63 67  Resp: 16  18 17   Temp:    97.9 F (36.6 C)  TempSrc:    Oral  SpO2: 96% 96% 96% 97%  Weight:      Height:        Intake/Output Summary (Last 24 hours) at 05/14/16 1238 Last data filed at 05/14/16 0815  Gross per 24 hour  Intake             1470 ml  Output             1430 ml  Net               40 ml   Filed Weights   05/12/16 0337 05/13/16 0400 05/14/16 0500  Weight:  80.7 kg (178 lb) 86.6 kg (191 lb) (!) 187 kg (412 lb 4.2 oz)    Examination: Constitutional: NAD, ill appearing, NG tube in place Vitals:   05/14/16 1000 05/14/16 1100 05/14/16 1200 05/14/16 1212  BP: 108/66 117/77 124/62 124/62  Pulse: 77 77 63 67  Resp: 16 15 18 17   Temp:    97.9 F (36.6 C)  TempSrc:    Oral  SpO2: 96% 96% 96% 97%  Weight:      Height:       Eyes: PERRL, lids and conjunctivae normal ENMT: Mucous membranes are moist. Respiratory: clear to auscultation bilaterally, no wheezing, no crackles. Normal respiratory effort.  Cardiovascular: Regular rate and rhythm, no murmurs / rubs / gallops. Trace LE edema. 2+ pedal pulses. Abdomen: no tenderness. Bowel sounds positive. Left flank dressing c/d/i, drain in place Neurologic: non focal   Data Reviewed: I have personally reviewed following labs and imaging studies  CBC:  Recent Labs Lab 05/08/16 0415 05/10/16 0229 05/12/16 0541 05/14/16 0229  WBC 16.3* 14.1* 16.0* 18.4*  NEUTROABS  --   --  10.9*  --   HGB 11.3* 10.8* 10.5* 10.7*  HCT 34.8* 32.2* 32.9* 32.8*  MCV 96.1 95.0 96.5 95.6  PLT 190 171 192 213   Basic Metabolic Panel:  Recent Labs Lab 05/08/16 0415 05/09/16 0250 05/10/16 0229 05/12/16 0541 05/14/16 0229  NA  137 138 135 134* 131*  K 3.9 4.0 3.7 3.8 4.5  CL 107 105 105 103 99*  CO2 27 26 25 28 28   GLUCOSE 189* 117* 112* 154* 153*  BUN 21* 22* 20 21* 16  CREATININE 0.58* 0.64 0.64 0.86 0.75  CALCIUM 8.0* 8.1* 8.0* 8.1* 8.1*  MG 1.9  --  1.8 1.9  --   PHOS 2.4* 2.6 3.2 3.0  --    GFR: Estimated Creatinine Clearance: 122.1 mL/min (by C-G formula based on SCr of 0.75 mg/dL). Liver Function Tests:  Recent Labs Lab 05/08/16 0415 05/10/16 0229 05/12/16 0541 05/14/16 0229  AST 155* 53* 45* 35  ALT 53 43 39 32  ALKPHOS 317* 257* 299* 261*  BILITOT 0.4 0.6 0.7 0.9  PROT 4.9* 4.9* 5.4* 5.0*  ALBUMIN 1.6* 1.6* 1.5* 1.5*    Recent Labs Lab 05/10/16 0229  LIPASE 18    Recent  Labs Lab 05/11/16 0223  AMMONIA 19   Coagulation Profile:  Recent Labs Lab 05/12/16 1415  INR 1.28   Cardiac Enzymes: No results for input(s): CKTOTAL, CKMB, CKMBINDEX, TROPONINI in the last 168 hours. BNP (last 3 results) No results for input(s): PROBNP in the last 8760 hours. HbA1C: No results for input(s): HGBA1C in the last 72 hours. CBG:  Recent Labs Lab 05/13/16 2030 05/14/16 0041 05/14/16 0440 05/14/16 0813 05/14/16 1211  GLUCAP 110* 122* 136* 123* 143*   Lipid Profile:  Recent Labs  05/12/16 0541  TRIG 41   Thyroid Function Tests: No results for input(s): TSH, T4TOTAL, FREET4, T3FREE, THYROIDAB in the last 72 hours. Anemia Panel: No results for input(s): VITAMINB12, FOLATE, FERRITIN, TIBC, IRON, RETICCTPCT in the last 72 hours. Urine analysis:    Component Value Date/Time   COLORURINE AMBER (A) 05/02/2016 1149   APPEARANCEUR CLOUDY (A) 05/02/2016 1149   LABSPEC 1.024 05/02/2016 1149   PHURINE 6.0 05/02/2016 1149   GLUCOSEU NEGATIVE 05/02/2016 1149   HGBUR NEGATIVE 05/02/2016 1149   BILIRUBINUR SMALL (A) 05/02/2016 1149   KETONESUR NEGATIVE 05/02/2016 1149   PROTEINUR NEGATIVE 05/02/2016 1149   UROBILINOGEN 0.2 03/08/2010 1026   NITRITE NEGATIVE 05/02/2016 1149   LEUKOCYTESUR SMALL (A) 05/02/2016 1149   Sepsis Labs: Invalid input(s): PROCALCITONIN, LACTICIDVEN  Recent Results (from the past 240 hour(s))  Aerobic/Anaerobic Culture (surgical/deep wound)     Status: None (Preliminary result)   Collection Time: 05/13/16  1:57 PM  Result Value Ref Range Status   Specimen Description ABSCESS  Final   Special Requests PANCREATIC PSEUDOCYST  Final   Gram Stain   Final    ABUNDANT WBC PRESENT,BOTH PMN AND MONONUCLEAR NO ORGANISMS SEEN    Culture FEW GRAM NEGATIVE RODS  Final   Report Status PENDING  Incomplete      Radiology Studies: Ct Image Guided Drainage By Percutaneous Catheter  Result Date: 05/13/2016 CLINICAL DATA:  Severe  pancreatitis with infected necrosis of the entire pancreas and formation of infected pseudocyst. Referral for percutaneous drainage of infected pseudocyst given high risk currently for surgical debridement. EXAM: CT GUIDED CATHETER DRAINAGE OF RETROPERITONEAL PANCREATIC ABSCESS ANESTHESIA/SEDATION: 1.0 mg IV Versed 25 mcg IV Fentanyl Total Moderate Sedation Time:  25 minutes The patient's level of consciousness and physiologic status were continuously monitored during the procedure by Radiology nursing. PROCEDURE: The procedure, risks, benefits, and alternatives were explained to the patient. Questions regarding the procedure were encouraged and answered. The patient understands and consents to the procedure. A time out was performed prior to initiating the procedure. The  left lateral abdominal wall was prepped with chlorhexidine in a sterile fashion, and a sterile drape was applied covering the operative field. A sterile gown and sterile gloves were used for the procedure. Local anesthesia was provided with 1% Lidocaine. CT was performed. After localizing a far left lateral retroperitoneal approach to the pancreas, an 18 gauge trocar needle was advanced under CT fluoroscopic guidance to the level of the infected pancreatic pseudocyst. After confirming needle tip position, a guidewire was advanced. The tract was dilated and a 14 French pigtail drainage catheter advanced. The catheter was formed and aspirated. A fluid sample was sent for culture analysis. The catheter was secured at the skin with a Prolene retention suture and StatLock device. It was connected to suction bulb drainage. COMPLICATIONS: None FINDINGS: Drainage catheter course was along the rough course of the tail of the pancreas with the drain terminating in the region of the body of the pancreas. The necrotic pancreas and infected pseudocyst yielded blood tinged, purulent fluid with debris. A sample was sent for culture analysis. IMPRESSION: CT-guided  drainage of large region of pancreatic necrosis and infected pseudocyst formation. A 14 French percutaneous drain was placed from a left lateral retroperitoneal approach. The drain was attached to suction bulb drainage. A fluid sample was sent for culture analysis. Electronically Signed   By: Irish Lack M.D.   On: 05/13/2016 16:55   Scheduled Meds: . atorvastatin  10 mg Oral q1800  . bisacodyl  5 mg Rectal Once  . chlorhexidine  15 mL Mouth Rinse BID  . fluconazole (DIFLUCAN) IV  100 mg Intravenous Q24H  . guaiFENesin-dextromethorphan  5 mL Oral Q8H  . imipenem-cilastatin  500 mg Intravenous Q6H  . insulin aspart  0-15 Units Subcutaneous Q4H  . lipase/protease/amylase  36,000 Units Oral TID AC  . mouth rinse  15 mL Mouth Rinse q12n4p  . metoprolol  100 mg Oral BID  . pantoprazole (PROTONIX) IV  40 mg Intravenous Q12H  . sodium chloride flush  10-40 mL Intracatheter Q12H  . sodium chloride flush  3 mL Intravenous Q12H  . vancomycin  1,000 mg Intravenous Q12H   Continuous Infusions: . Marland KitchenTPN (CLINIMIX-E) Adult    . sodium chloride 10 mL/hr at 05/09/16 1039  . diltiazem (CARDIZEM) infusion    . feeding supplement (JEVITY 1.2 CAL) 1,000 mL (05/14/16 1230)  . Marland KitchenTPN (CLINIMIX-E) Adult 40 mL/hr at 05/13/16 1752   Pamella Pert, MD, PhD [facility] Pager [phone]  If 7PM-7AM, please contact night-coverage [email or web address] Password [facility] 05/14/2016, 12:38 PM

## 2016-05-14 NOTE — Care Management Important Message (Signed)
Important Message  Patient Details  Name: Farris HasGraham N Freundlich MRN: [id number] Date of Birth: [birth year]   Medicare Important Message Given:  Yes    Kyla BalzarineShealy, Zonia Caplin Abena 05/14/2016, 10:47 AM

## 2016-05-14 NOTE — Progress Notes (Signed)
Per Merdis DelayK. Schorr, NP do not restart tube feeding. Will wait until tomorrow.

## 2016-05-14 NOTE — Progress Notes (Signed)
PHARMACY - ADULT TOTAL PARENTERAL NUTRITION CONSULT NOTE   Pharmacy Consult:  TPN Indication:  Intolerance of enteral feeding  Patient Measurements: Height: 5' 9" (175.3 cm) Weight: 160 lb 11.5 oz (72.9 kg) IBW/kg (Calculated) : 70.7 TPN AdjBW (KG): 72.9 Body mass index is 23.73 kg/m.  Assessment:  82 YOM admitted 10/8-10/12/17 with hemorrhagic pancreatitis, readmitted 11/7-11/13/17 with severe Ecoli sepsis, and readmitted 05/02/16 with weakness, N/V, and abdominal pain.  CT showed intra-abdominal abscesses.  Patient has lost 20lb in the last 2 months and has severe malnutrition per RD assessment. Tube feeds were planned to start but patient with significant nausea and bilious emesis on 05/04/16 so pharmacy consulted to manage TPN.   GI: severe malnutrition with gallstone pancreatitis. Prealbumin improved to 5.8.  Creon, Protonix IV BID.  May go home on TF and come back for surgery. Drain placed 11/28 - 130 cc out. TF were held, to resume 11/29. LBM 11/22 Endo: hx DM on metformin PTA - CBGs controlled (123-153) Insulin requirements in the past 24 hours: 20 units in TPN bag + 9 units of SSI Lytes: 11/29 labs - low Na (decr), K up 4.5; No Phos/Mg today. Renal: SCr stable, CrCl ~70 ml/min - NS 10 ml/hr, good UOP 0.7 ml/kg/hr Pulm: stable on RA - Robitussin DM Cards: carotid artery stenosis, HTN, HLD - VSS, NSR on Lopressor, Lipitor, diltiazem gtt Hepatobil: alk phos elevated, AST/ tbili / TG WNL Neuro: depression, anxiety.  Pain score 6, PRN morphine ID: Vanc/Primaxin/Fluc for E.coli abscess and possible necrotizing pancreatitis - afebrile, WBC up 18.4; purulent drainage from new back drain Best Practices: SCDs, mouth care TPN Access: PICC placed 05/03/16 TPN start date: 05/05/16  Nutritional Goals (per RD recommendation on 11/25): 1900-2100 kCal and 100-125 g of protein per day  Current Nutrition:  Clinimix = 845 kCal and 48 mg protein per day Jevity 1.2 at 30 ml/hr = 864 kCal  and 40mg per day   Plan:  - Continue Clinimix E 5/20 at 40 ml/hr.  Hold lipids since patient is on tube feed.   - Jevity 1.2 at 30 ml/hr per MD - to restart today (verified with RN) - TPN and TF provide 1709 kCal and 88gm of protein per day, meeting 90% of kCal and 88% of protein needs. - Daily multivitamin and trace elements in TPN - Continue with 20 units of regular insulin in TPN and moderate SSI Q4H - F/U with TF advancement to D/C TPN    , PharmD, BCPS Clinical Pharmacist #[id number] until 3:30 PM #[id number] after hours 05/14/2016, 8:22 AM    

## 2016-05-14 NOTE — Progress Notes (Signed)
Subjective: Somnolent this am, drain in yesterday  Objective: Vital signs in last 24 hours: Temp:  [98.2 F (36.8 C)-98.8 F (37.1 C)] 98.8 F (37.1 C) (11/29 0815) Pulse Rate:  [72-91] 74 (11/29 0815) Resp:  [12-20] 15 (11/29 0815) BP: (77-151)/(40-76) 121/63 (11/29 0815) SpO2:  [94 %-98 %] 96 % (11/29 0815) Weight:  [187 kg (412 lb 4.2 oz)] 187 kg (412 lb 4.2 oz) (11/29 0500) Last BM Date: 04/17/16  Intake/Output from previous day: 11/28 0701 - 11/29 0700 In: 1750 [I.V.:1200; IV Piggyback:530] Out: 1805 [Urine:1675; Drains:130] Intake/Output this shift: Total I/O In: -  Out: 300 [Urine:300]  GI: soft nt/nd bs present, drain with gray fluid and semisolid material  Lab Results:   Recent Labs  05/12/16 0541 05/14/16 0229  WBC 16.0* 18.4*  HGB 10.5* 10.7*  HCT 32.9* 32.8*  PLT 192 213   BMET  Recent Labs  05/12/16 0541 05/14/16 0229  NA 134* 131*  K 3.8 4.5  CL 103 99*  CO2 28 28  GLUCOSE 154* 153*  BUN 21* 16  CREATININE 0.86 0.75  CALCIUM 8.1* 8.1*   PT/INR  Recent Labs  05/12/16 1415  LABPROT 16.1*  INR 1.28   ABG No results for input(s): PHART, HCO3 in the last 72 hours.  Invalid input(s): PCO2, PO2  Studies/Results: Dg Chest Port 1 View  Result Date: 05/12/2016 CLINICAL DATA:  Cough, history of hypertension, hyperlipidemia, and diabetes. EXAM: PORTABLE CHEST 1 VIEW COMPARISON:  Portable chest x-ray of May 08, 2016 FINDINGS: The lung volumes remain low. There is increased density at the left lung base with new obscuration of the hemidiaphragm. The heart and pulmonary vascularity are normal. There is tortuosity of the ascending and descending thoracic aorta. The PICC line tip on the right projects over the midportion of the SVC. The feeding tube tip projects below the inferior margin of the image beyond the second portion of the duodenum. IMPRESSION: Increased density at the left lung base consistent with atelectasis or pneumonia with  small left pleural effusion. Stable bilateral hypo nflation. Electronically Signed   By: David  SwazilandJordan M.D.   On: 05/12/2016 09:27   Ct Image Guided Drainage By Percutaneous Catheter  Result Date: 05/13/2016 CLINICAL DATA:  Severe pancreatitis with infected necrosis of the entire pancreas and formation of infected pseudocyst. Referral for percutaneous drainage of infected pseudocyst given high risk currently for surgical debridement. EXAM: CT GUIDED CATHETER DRAINAGE OF RETROPERITONEAL PANCREATIC ABSCESS ANESTHESIA/SEDATION: 1.0 mg IV Versed 25 mcg IV Fentanyl Total Moderate Sedation Time:  25 minutes The patient's level of consciousness and physiologic status were continuously monitored during the procedure by Radiology nursing. PROCEDURE: The procedure, risks, benefits, and alternatives were explained to the patient. Questions regarding the procedure were encouraged and answered. The patient understands and consents to the procedure. A time out was performed prior to initiating the procedure. The left lateral abdominal wall was prepped with chlorhexidine in a sterile fashion, and a sterile drape was applied covering the operative field. A sterile gown and sterile gloves were used for the procedure. Local anesthesia was provided with 1% Lidocaine. CT was performed. After localizing a far left lateral retroperitoneal approach to the pancreas, an 18 gauge trocar needle was advanced under CT fluoroscopic guidance to the level of the infected pancreatic pseudocyst. After confirming needle tip position, a guidewire was advanced. The tract was dilated and a 14 French pigtail drainage catheter advanced. The catheter was formed and aspirated. A fluid sample was sent for culture  analysis. The catheter was secured at the skin with a Prolene retention suture and StatLock device. It was connected to suction bulb drainage. COMPLICATIONS: None FINDINGS: Drainage catheter course was along the rough course of the tail of the  pancreas with the drain terminating in the region of the body of the pancreas. The necrotic pancreas and infected pseudocyst yielded blood tinged, purulent fluid with debris. A sample was sent for culture analysis. IMPRESSION: CT-guided drainage of large region of pancreatic necrosis and infected pseudocyst formation. A 14 French percutaneous drain was placed from a left lateral retroperitoneal approach. The drain was attached to suction bulb drainage. A fluid sample was sent for culture analysis. Electronically Signed   By: Irish LackGlenn  Yamagata M.D.   On: 05/13/2016 16:55    Anti-infectives: Anti-infectives    Start     Dose/Rate Route Frequency Ordered Stop   05/12/16 0000  vancomycin (VANCOCIN) IVPB 1000 mg/200 mL premix     1,000 mg 200 mL/hr over 60 Minutes Intravenous Every 12 hours 05/11/16 1322     05/11/16 1315  vancomycin (VANCOCIN) 500 mg in sodium chloride 0.9 % 100 mL IVPB     500 mg 100 mL/hr over 60 Minutes Intravenous STAT 05/11/16 1304 05/11/16 1558   05/10/16 1200  imipenem-cilastatin (PRIMAXIN) 500 mg in sodium chloride 0.9 % 100 mL IVPB     500 mg 200 mL/hr over 30 Minutes Intravenous Every 6 hours 05/10/16 0846     05/09/16 1200  cefTRIAXone (ROCEPHIN) 2 g in dextrose 5 % 50 mL IVPB  Status:  Discontinued     2 g 100 mL/hr over 30 Minutes Intravenous Every 24 hours 05/09/16 0801 05/09/16 1050   05/09/16 1200  cefTRIAXone (ROCEPHIN) 1 g in dextrose 5 % 50 mL IVPB  Status:  Discontinued     1 g 100 mL/hr over 30 Minutes Intravenous Every 24 hours 05/09/16 1050 05/10/16 0833   05/09/16 1100  vancomycin (VANCOCIN) IVPB 750 mg/150 ml premix  Status:  Discontinued     750 mg 150 mL/hr over 60 Minutes Intravenous Every 12 hours 05/09/16 1051 05/11/16 1322   05/05/16 1230  fluconazole (DIFLUCAN) IVPB 100 mg     100 mg 50 mL/hr over 60 Minutes Intravenous Every 24 hours 05/05/16 1135     05/03/16 1200  imipenem-cilastatin (PRIMAXIN) 500 mg in sodium chloride 0.9 % 100 mL IVPB   Status:  Discontinued     500 mg 200 mL/hr over 30 Minutes Intravenous Every 6 hours 05/03/16 1122 05/09/16 0801   05/03/16 0030  piperacillin-tazobactam (ZOSYN) IVPB 3.375 g  Status:  Discontinued     3.375 g 12.5 mL/hr over 240 Minutes Intravenous Every 8 hours 05/02/16 1647 05/03/16 1101   05/02/16 1930  fluconazole (DIFLUCAN) tablet 100 mg  Status:  Discontinued     100 mg Oral Daily 05/02/16 1816 05/05/16 1135   05/02/16 1600  piperacillin-tazobactam (ZOSYN) IVPB 3.375 g     3.375 g 100 mL/hr over 30 Minutes Intravenous  Once 05/02/16 1548 05/02/16 1720      Assessment/Plan: Gallstone pancreatitiswith infected pancreatic necrosis  Tube feeds should be on, not sure why stopped.   Continue abx and drainage, appreciate ir drainage and hopefully this will help.  The ct prior to this yesterday also looked better. Will continue for now with conservative mgt with drain, abx, tube feeds.  Surgery via rp approach is still option moving forward as well.    [facility] CampusWAKEFIELD,Cheyna Retana 05/14/2016

## 2016-05-14 NOTE — Progress Notes (Signed)
Pharmacy Antibiotic Note  Colin West is a 82 y.o. male admitted on 05/02/2016 with pancreatic abscesses and possible PNA.  Pharmacy was consulted for Primaxin and pharmacy dosing.    On imipenem for E. Coli pancreatis abscesses. Recent hospitalization for hemorrhagic pancreatitis.  CT on this admission showing multiple peripancreatic abscesses s/p aspiration of peripancreatic fluid. Hx of E.coli pyelo at last admission. IR placed pancreatic drain on 11/27. Also started on vancomycin for r/o HCAP. CXR on 11/27 does show possible PNA. Afebrile, WBC elevated and up to 18.4.  Plan: Continue Primaxin 500mg  IV Q6 re-started 11/25 per surgery preference  Continue vancomycin 1g IV Q12 Monitor clinical picture, renal function, VT prn F/U C&S, abx deescalation / LOT  Consider de-escalating Primaxin to Ancef or Rocephin? Consider stopping vancomycin today or tomorrow?  Height: 5\' 9"  (175.3 cm) Weight: (!) 412 lb 4.2 oz (187 kg) IBW/kg (Calculated) : 70.7  Temp (24hrs), Avg:98.3 F (36.8 C), Min:97.9 F (36.6 C), Max:98.8 F (37.1 C)   Recent Labs Lab 05/08/16 0415 05/08/16 1700 05/09/16 0250 05/10/16 0229 05/11/16 1029 05/12/16 0541 05/14/16 0229  WBC 16.3*  --   --  14.1*  --  16.0* 18.4*  CREATININE 0.58*  --  0.64 0.64  --  0.86 0.75  LATICACIDVEN  --  1.3  --   --   --   --   --   VANCOTROUGH  --   --   --   --  13*  --   --     Estimated Creatinine Clearance: 122.1 mL/min (by C-G formula based on SCr of 0.75 mg/dL).    No Known Allergies  Antimicrobials this admission:  Zosyn 11/17 >> 11/18 Fluc 11/17>>11/20 Primaxin 11/18>> 11/24, 11/25 >> Ceftriaxone 11/24 >>11/25 Vancomycin 11/24 >>  Dose adjustments this admission:  11/26 VT = 13 on 750mg  q12h: (Increased to 1g IV Q12)  Microbiology results:  11/17 BCx: neg 11/17 UCx: neg  11/18 Abscess from drain: Ecoli (S- imipenem, cefazolin, CTX) 11/17 MRSA PCR: neg 11/28 Abscess Cx: GNRs   Thank you for allowing  pharmacy to be a part of this patient's care.  Enzo BiNathan Aibhlinn Kalmar, PharmD, BCPS Clinical Pharmacist Pager [phone] 05/14/2016 1:20 PM

## 2016-05-15 ENCOUNTER — Inpatient Hospital Stay (HOSPITAL_COMMUNITY): Payer: Medicare Other

## 2016-05-15 DIAGNOSIS — E43 Unspecified severe protein-calorie malnutrition: Secondary | ICD-10-CM | POA: Insufficient documentation

## 2016-05-15 LAB — COMPREHENSIVE METABOLIC PANEL
ALT: 23 U/L (ref 17–63)
AST: 25 U/L (ref 15–41)
Albumin: 1.4 g/dL — ABNORMAL LOW (ref 3.5–5.0)
Alkaline Phosphatase: 209 U/L — ABNORMAL HIGH (ref 38–126)
Anion gap: 5 (ref 5–15)
BILIRUBIN TOTAL: 0.6 mg/dL (ref 0.3–1.2)
BUN: 18 mg/dL (ref 6–20)
CO2: 26 mmol/L (ref 22–32)
CREATININE: 0.63 mg/dL (ref 0.61–1.24)
Calcium: 8 mg/dL — ABNORMAL LOW (ref 8.9–10.3)
Chloride: 100 mmol/L — ABNORMAL LOW (ref 101–111)
Glucose, Bld: 158 mg/dL — ABNORMAL HIGH (ref 65–99)
POTASSIUM: 4 mmol/L (ref 3.5–5.1)
Sodium: 131 mmol/L — ABNORMAL LOW (ref 135–145)
TOTAL PROTEIN: 5.1 g/dL — AB (ref 6.5–8.1)

## 2016-05-15 LAB — GLUCOSE, CAPILLARY
GLUCOSE-CAPILLARY: 186 mg/dL — AB (ref 65–99)
GLUCOSE-CAPILLARY: 180 mg/dL — AB (ref 65–99)
GLUCOSE-CAPILLARY: 164 mg/dL — AB (ref 65–99)
GLUCOSE-CAPILLARY: 158 mg/dL — AB (ref 65–99)
Glucose-Capillary: 157 mg/dL — ABNORMAL HIGH (ref 65–99)
Glucose-Capillary: 184 mg/dL — ABNORMAL HIGH (ref 65–99)

## 2016-05-15 LAB — CBC
HEMATOCRIT: 33.2 % — AB (ref 39.0–52.0)
Hemoglobin: 11 g/dL — ABNORMAL LOW (ref 13.0–17.0)
MCH: 31.3 pg (ref 26.0–34.0)
MCHC: 33.1 g/dL (ref 30.0–36.0)
MCV: 94.3 fL (ref 78.0–100.0)
Platelets: 263 10*3/uL (ref 150–400)
RBC: 3.52 MIL/uL — AB (ref 4.22–5.81)
RDW: 14.6 % (ref 11.5–15.5)
WBC: 18.6 10*3/uL — AB (ref 4.0–10.5)

## 2016-05-15 LAB — PHOSPHORUS: PHOSPHORUS: 2.9 mg/dL (ref 2.5–4.6)

## 2016-05-15 LAB — MAGNESIUM: MAGNESIUM: 1.9 mg/dL (ref 1.7–2.4)

## 2016-05-15 MED ORDER — DOCUSATE SODIUM 50 MG/5ML PO LIQD
50.0000 mg | Freq: Every day | ORAL | Status: DC
  Administered 2016-05-15 – 2016-05-20 (×6): 50 mg
  Filled 2016-05-15 (×7): qty 10

## 2016-05-15 MED ORDER — JEVITY 1.2 CAL PO LIQD: 1000.0000 mL | ORAL | Status: DC

## 2016-05-15 MED ORDER — FUROSEMIDE 10 MG/ML IJ SOLN
40.0000 mg | Freq: Once | INTRAMUSCULAR | Status: AC
  Administered 2016-05-15: 40 mg via INTRAVENOUS
  Filled 2016-05-15: qty 4

## 2016-05-15 MED ORDER — JEVITY 1.2 CAL PO LIQD
1000.0000 mL | ORAL | Status: DC
  Filled 2016-05-15 (×2): qty 1000

## 2016-05-15 MED ORDER — PANTOPRAZOLE SODIUM 40 MG PO PACK
40.0000 mg | PACK | Freq: Two times a day (BID) | ORAL | Status: DC
  Administered 2016-05-15 – 2016-05-20 (×10): 40 mg
  Filled 2016-05-15 (×14): qty 20

## 2016-05-15 MED ORDER — JEVITY 1.2 CAL PO LIQD
1000.0000 mL | ORAL | Status: DC
  Administered 2016-05-15: 60 mL/h
  Filled 2016-05-15 (×3): qty 1000

## 2016-05-15 MED ORDER — POLYETHYLENE GLYCOL 3350 17 G PO PACK
17.0000 g | PACK | Freq: Every day | ORAL | Status: DC
  Administered 2016-05-15 – 2016-05-18 (×4): 17 g via ORAL
  Filled 2016-05-15 (×5): qty 1

## 2016-05-15 NOTE — Progress Notes (Signed)
Colin West:[id number] DOB: 27-Jul-1933 DOA: 05/02/2016 PCP: [name], MD   LOS: 13 days   Brief Narrative: Glorious Peach a 82 y.o.malewith complex history recently hospitalized from 10/8-10/12 for hemorrhagic pancreatitis from gallblader source, and on 11/7-11/13 for Severe Sepsis due to E.Coli pyelonephritis, discharged to [facility] on oral antibiotics, brought to the emergency department with worsening generalized weakness, 11/17   with associated nausea and vomiting.   lost about 15 lbs in the last 2 months. CT scan shows multiple abscesses although not very well-developed, lots of air bubbles. Possible infected pancreatic necrosis. Patient found to have multiple peripancreatic abscesses, now scheduled for IR CT-guided drain placement, received drainage by IR x 2 on 11/18 and 11/28.  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 - CT scan showed multiple peripancreatic abscesses, was recently in the hospital from 10/8-10/12 for hemorrhagic pancreatitis from gallbladder source. - Currently on Primaxin and Diflucan for pancreatitis, vancomycin for possible HCAP, - Pancreatic abscess aspiration now growing Escherichia coli.  - General surgery recommends to continue Primaxin - Patient developed pancreatic necrosis while he was eating, need to be strict NPO with postpyloric feeding. Continue TPN as well per pharmacy - WBC still elevated today,  monitor - discuss with surgery, for now recommending close inpatient monitoring for response following drain placement and whether patient will need further surgery for his abscess. Will need repeat CT abdomen next week  Patient tube feeds increased to 30 ml/hr and TPN reduced. Patient tolerating.  - As per surgery, patient's nutritional and functional status needs to improve prior to consider surgery which seems unlikely. - patient refuses to go back to SNF in the interval that he is waiting for surgery - Per surgery CT showsno sign of cholecystitis or even any sign of cholelithiasis. Laparoscopic cholecystectomy at this time will do nothing to improve his pancreatitis/ pancreatic abscesses - Cardiology reconsulted 11/22 - if patient requires laparoscopic surgery - moderate risk, if patient requires open surgery, it would be considered high risk, at this point cannot rule out ischemia, though no further cardiac testing recommended. Continue beta blocker. Proceed with surgery if indicated - Repeat 2-D echo shows mild improvement in EF to 35-40%.  - d/w general surgery today   Cough /probable HCAP - Suspect developing pna chest x-ray shows left basilar atelectasis versus pneumonia - Continue vancomycin/imipenem through today given increase in WBC - Counseled RN to maintain strict aspiration precautions and pulmonary toilet, leukocytosis improving  Protein calorie malnutrition, severe  - Lost 20 pounds in the past 2 months, albumin continues to decline - Per general surgery recommendation try postpyloric enteral nutrition prior to TPN. - Currently on both TPN and enteric feeding held because of ileus, now resumed - CORTRAK, in place  Atrial fibrillation, paroxysmal - Transient atrial fibrillation with heart rate 130s, back to sinus rhythm with IV/by mouth metoprolol. Continue oral metoprolol - TSH  2.319 - Cardiology has intermittently followed the patient during this hospitalization and  during previous hospitalizations, defer anticoagulation to them  Type II Diabetes  - CBGs controlled 120-140s  Hypertension stable - Controlled - Continue home anti-hypertensive medications   Hiccups - This is likely secondary to the irritation of the diaphragm from pancreatic abscess, on Reglan. Stable today   Hyperlipidemia - Continue home statins  Acute on chronic systolic CHF in the setting of dilated Cardiomyopathy in view of recent sepsis - 2 D echo recently shows EF 30-35 % 11/10, repeat echo unchanged - Cautiously monitor fluid status, monitor weights, appears to have gained 1lb. H is up 17L, CXR with fluid overload, give Lasix 40 mg iv x 1  ?Dementia/encephalopathic - Minimize narcotics   DVT prophylaxis: SCDs Code Status: Full code Family Communication: no family at bedside Disposition Plan: not ready for D/C  Consultants:   General surgery   Interventional Radiology  Cardiology  Procedures:   2D echo 05/08/16 Impressions: - Mild LVH with LVEF approximately 35-40%, diffuse hypokinesis. Grade 1 diastolic dysfunction. Mitral annular calcification with mild mitral regurgitation. Mildly sclerotic aortic valve. Trivial tricuspid regurgitation.  Peripancreatic fluid collection aspiration 11/18 >> growing E coli (resistant with Ampicillin and bactrim)  CT-guided drainage of large region of pancreatic necrosis and infected pseudocyst formation. A 14 French 11/28 >> preliminary growing Gram negative rods   Antimicrobials:  Imipenem 11/18 >>   Vancomycin 11/24 >>  Fluconazole 11/20 >>   Subjective: - complains of left flank pain where drain is in place  Objective: Vitals:   05/15/16 0600 05/15/16 0700 05/15/16 0737 05/15/16 0800  BP: 115/63 (!) 145/70 (!) 145/70 136/67  Pulse: 83 85 75 88  Resp: 16 19 17 19   Temp:   98 F (36.7 C)   TempSrc:   Oral   SpO2: 96% 97% 96% 96%  Weight:      Height:        Intake/Output Summary (Last 24 hours)  at 05/15/16 1148 Last data filed at 05/15/16 0600  Gross per 24 hour  Intake             2440 ml  Output             1340 ml  Net             1100 ml   Filed Weights   05/13/16 0400 05/14/16 0500 05/15/16 0300  Weight: 86.6 kg (191 lb) (!) 187 kg (412 lb 4.2 oz) 85.3 kg (188 lb)    Examination: Constitutional: NAD, ill appearing, NG tube in place Vitals:   05/15/16 0600 05/15/16 0700 05/15/16 0737 05/15/16 0800  BP: 115/63 (!) 145/70 (!) 145/70 136/67  Pulse: 83 85 75 88  Resp: 16 19 17 19   Temp:   98 F (36.7 C)   TempSrc:   Oral   SpO2: 96% 97% 96% 96%  Weight:      Height:       Eyes: PERRL, lids and conjunctivae normal ENMT: Mucous membranes are moist. Respiratory: clear to auscultation bilaterally, no wheezing, no crackles. Normal respiratory effort.  Cardiovascular: Regular rate and rhythm, no murmurs / rubs / gallops. Trace LE edema. 2+ pedal pulses. Abdomen: no tenderness. Bowel sounds positive. Left flank dressing c/d/i, drain in place Neurologic: non focal   Data Reviewed: I have personally reviewed following labs and imaging studies  CBC:  Recent Labs Lab 05/10/16 0229 05/12/16 0541 05/14/16 0229 05/15/16 0912  WBC 14.1* 16.0* 18.4* 18.6*  NEUTROABS  --  10.9*  --   --   HGB 10.8* 10.5* 10.7* 11.0*  HCT 32.2* 32.9*  32.8* 33.2*  MCV 95.0 96.5 95.6 94.3  PLT 171 192 213 263   Basic Metabolic Panel:  Recent Labs Lab 05/09/16 0250 05/10/16 0229 05/12/16 0541 05/14/16 0229 05/15/16 0600  NA 138 135 134* 131* 131*  K 4.0 3.7 3.8 4.5 4.0  CL 105 105 103 99* 100*  CO2 26 25 28 28 26   GLUCOSE 117* 112* 154* 153* 158*  BUN 22* 20 21* 16 18  CREATININE 0.64 0.64 0.86 0.75 0.63  CALCIUM 8.1* 8.0* 8.1* 8.1* 8.0*  MG  --  1.8 1.9  --  1.9  PHOS 2.6 3.2 3.0  --  2.9   GFR: Estimated Creatinine Clearance: 79.7 mL/min (by C-G formula based on SCr of 0.63 mg/dL). Liver Function Tests:  Recent Labs Lab 05/10/16 0229 05/12/16 0541 05/14/16 0229  05/15/16 0600  AST 53* 45* 35 25  ALT 43 39 32 23  ALKPHOS 257* 299* 261* 209*  BILITOT 0.6 0.7 0.9 0.6  PROT 4.9* 5.4* 5.0* 5.1*  ALBUMIN 1.6* 1.5* 1.5* 1.4*    Recent Labs Lab 05/10/16 0229  LIPASE 18    Recent Labs Lab 05/11/16 0223  AMMONIA 19   Coagulation Profile:  Recent Labs Lab 05/12/16 1415  INR 1.28   Cardiac Enzymes: No results for input(s): CKTOTAL, CKMB, CKMBINDEX, TROPONINI in the last 168 hours. BNP (last 3 results) No results for input(s): PROBNP in the last 8760 hours. HbA1C: No results for input(s): HGBA1C in the last 72 hours. CBG:  Recent Labs Lab 05/14/16 1613 05/14/16 2045 05/15/16 0005 05/15/16 0329 05/15/16 0735  GLUCAP 168* 146* 157* 186* 184*   Lipid Profile: No results for input(s): CHOL, HDL, LDLCALC, TRIG, CHOLHDL, LDLDIRECT in the last 72 hours. Thyroid Function Tests: No results for input(s): TSH, T4TOTAL, FREET4, T3FREE, THYROIDAB in the last 72 hours. Anemia Panel: No results for input(s): VITAMINB12, FOLATE, FERRITIN, TIBC, IRON, RETICCTPCT in the last 72 hours. Urine analysis:    Component Value Date/Time   COLORURINE AMBER (A) 05/02/2016 1149   APPEARANCEUR CLOUDY (A) 05/02/2016 1149   LABSPEC 1.024 05/02/2016 1149   PHURINE 6.0 05/02/2016 1149   GLUCOSEU NEGATIVE 05/02/2016 1149   HGBUR NEGATIVE 05/02/2016 1149   BILIRUBINUR SMALL (A) 05/02/2016 1149   KETONESUR NEGATIVE 05/02/2016 1149   PROTEINUR NEGATIVE 05/02/2016 1149   UROBILINOGEN 0.2 03/08/2010 1026   NITRITE NEGATIVE 05/02/2016 1149   LEUKOCYTESUR SMALL (A) 05/02/2016 1149   Sepsis Labs: Invalid input(s): PROCALCITONIN, LACTICIDVEN  Recent Results (from the past 240 hour(s))  Aerobic/Anaerobic Culture (surgical/deep wound)     Status: None (Preliminary result)   Collection Time: 05/13/16  1:57 PM  Result Value Ref Range Status   Specimen Description ABSCESS  Final   Special Requests PANCREATIC PSEUDOCYST  Final   Gram Stain   Final    ABUNDANT  WBC PRESENT,BOTH PMN AND MONONUCLEAR NO ORGANISMS SEEN    Culture   Final    FEW GRAM NEGATIVE RODS NO ANAEROBES ISOLATED; CULTURE IN Colin FOR 5 DAYS    Report Status PENDING  Incomplete      Radiology Studies: Dg Chest Port 1 View  Result Date: 05/15/2016 CLINICAL DATA:  Dyspnea. History of hypertension and diabetes, remote history of smoking. EXAM: PORTABLE CHEST 1 VIEW COMPARISON:  Portable chest x-ray of May 12, 2016 FINDINGS: The lungs remain hypoinflated. The appearance is accentuated by the lordotic positioning. The interstitial markings are coarse. There is no alveolar infiltrate. There is no pleural effusion. The heart and pulmonary vascularity are  normal. There is tortuosity of the ascending and descending thoracic aorta. The feeding tube tip projects below the inferior margin of the image. A pigtail catheter projects over the mid upper abdomen in the midline. IMPRESSION: Bilateral hypoinflation. Mild interstitial prominence in part due to vascular crowding but mild interstitial edema or infiltrate may be developing. No alveolar pneumonia. Electronically Signed   By: David  [place] M.D.   On: 05/15/2016 08:07   Dg Abd Portable 2v  Result Date: 05/15/2016 CLINICAL DATA:  Constipation EXAM: PORTABLE ABDOMEN - 2 VIEW COMPARISON:  CT abdomen 05/13/2016 FINDINGS: Feeding tube tip in the proximal jejunum. Pigtail drainage catheter in the epigastric region in the region of the pancreas. Mild to moderate amount of stool in the colon. Negative for bowel obstruction. No free air on the decubitus view. IMPRESSION: Mild moderate stool in the colon without bowel obstruction or perforation Feeding tube tip in the jejunum Pigtail drainage catheter in the pancreas region. Electronically Signed   By: Marlan Palau M.D.   On: 05/15/2016 10:11   Ct Image Guided Drainage By Percutaneous Catheter  Result Date: 05/13/2016 CLINICAL DATA:  Severe pancreatitis with infected necrosis of the entire  pancreas and formation of infected pseudocyst. Referral for percutaneous drainage of infected pseudocyst given high risk currently for surgical debridement. EXAM: CT GUIDED CATHETER DRAINAGE OF RETROPERITONEAL PANCREATIC ABSCESS ANESTHESIA/SEDATION: 1.0 mg IV Versed 25 mcg IV Fentanyl Total Moderate Sedation Time:  25 minutes The patient's level of consciousness and physiologic status were continuously monitored during the procedure by Radiology nursing. PROCEDURE: The procedure, risks, benefits, and alternatives were explained to the patient. Questions regarding the procedure were encouraged and answered. The patient understands and consents to the procedure. A time out was performed prior to initiating the procedure. The left lateral abdominal wall was prepped with chlorhexidine in a sterile fashion, and a sterile drape was applied covering the operative field. A sterile gown and sterile gloves were used for the procedure. Local anesthesia was provided with 1% Lidocaine. CT was performed. After localizing a far left lateral retroperitoneal approach to the pancreas, an 18 gauge trocar needle was advanced under CT fluoroscopic guidance to the level of the infected pancreatic pseudocyst. After confirming needle tip position, a guidewire was advanced. The tract was dilated and a 14 French pigtail drainage catheter advanced. The catheter was formed and aspirated. A fluid sample was sent for culture analysis. The catheter was secured at the skin with a Prolene retention suture and StatLock device. It was connected to suction bulb drainage. COMPLICATIONS: None FINDINGS: Drainage catheter course was along the rough course of the tail of the pancreas with the drain terminating in the region of the body of the pancreas. The necrotic pancreas and infected pseudocyst yielded blood tinged, purulent fluid with debris. A sample was sent for culture analysis. IMPRESSION: CT-guided drainage of large region of pancreatic necrosis  and infected pseudocyst formation. A 14 French percutaneous drain was placed from a left lateral retroperitoneal approach. The drain was attached to suction bulb drainage. A fluid sample was sent for culture analysis. Electronically Signed   By: Irish Lack M.D.   On: 05/13/2016 16:55   Scheduled Meds: . atorvastatin  10 mg Oral q1800  . bisacodyl  5 mg Rectal Once  . chlorhexidine  15 mL Mouth Rinse BID  . docusate  50 mg Per Tube Daily  . fluconazole (DIFLUCAN) IV  100 mg Intravenous Q24H  . furosemide  40 mg Intravenous Once  . guaiFENesin-dextromethorphan  5 mL Oral Q8H  . imipenem-cilastatin  500 mg Intravenous Q6H  . insulin aspart  0-15 Units Subcutaneous Q4H  . mouth rinse  15 mL Mouth Rinse q12n4p  . metoprolol  100 mg Oral BID  . pantoprazole sodium  40 mg Per Tube BID  . polyethylene glycol  17 g Oral Daily  . sodium chloride flush  10-40 mL Intracatheter Q12H  . sodium chloride flush  3 mL Intravenous Q12H  . vancomycin  1,000 mg Intravenous Q12H   Continuous Infusions: . sodium chloride 10 mL/hr at 05/09/16 1039  . feeding supplement (JEVITY 1.2 CAL)     Pamella Pertostin Cinthya Bors, MD, PhD [facility] Pager [phone]  If 7PM-7AM, please contact night-coverage [email or web address] Password [email or web address] 05/15/2016, 11:48 AM

## 2016-05-15 NOTE — Progress Notes (Signed)
[facility] Progress Note     Subjective: Pt complaining of abdominal pain and distention. States worsening productive cough and difficulty breathing. No BM for 8 days, + flatulence. No N/V. Family at bedside. Family concerned about pt getting too much pain medication and his productive cough.   Objective: Vital signs in last 24 hours: Temp:  [97.9 F (36.6 C)-98.7 F (37.1 C)] 98 F (36.7 C) (11/30 0737) Pulse Rate:  [63-91] 75 (11/30 0737) Resp:  [15-26] 17 (11/30 0737) BP: (108-152)/(55-85) 145/70 (11/30 0737) SpO2:  [95 %-97 %] 96 % (11/30 0737) Weight:  [188 lb (85.3 kg)] 188 lb (85.3 kg) (11/30 0300) Last BM Date: 05/07/16  Intake/Output from previous day: 11/29 0701 - 11/30 0700 In: 2550 [I.V.:980; NG/GT:700; IV Piggyback:850] Out: 1640 [Urine:1580; Drains:60] Intake/Output this shift: No intake/output data recorded.  PE: Gen:  Alert, NAD, pleasant Card:  RRR Pulm:  Effort normal, No wheezes, no decreased breath sounds Abd: slightly firm, mildly distented, +BS, generalized TTP, drain with moderate amount of grey course material  Lab Results:   Recent Labs  05/14/16 0229  WBC 18.4*  HGB 10.7*  HCT 32.8*  PLT 213   BMET  Recent Labs  05/14/16 0229 05/15/16 0600  NA 131* 131*  K 4.5 4.0  CL 99* 100*  CO2 28 26  GLUCOSE 153* 158*  BUN 16 18  CREATININE 0.75 0.63  CALCIUM 8.1* 8.0*   PT/INR  Recent Labs  05/12/16 1415  LABPROT 16.1*  INR 1.28   CMP     Component Value Date/Time   NA 131 (L) 05/15/2016 0600   NA 136 (A) 05/01/2016   K 4.0 05/15/2016 0600   CL 100 (L) 05/15/2016 0600   CO2 26 05/15/2016 0600   GLUCOSE 158 (H) 05/15/2016 0600   BUN 18 05/15/2016 0600   BUN 23 (A) 05/01/2016   CREATININE 0.63 05/15/2016 0600   CALCIUM 8.0 (L) 05/15/2016 0600   PROT 5.1 (L) 05/15/2016 0600   ALBUMIN 1.4 (L) 05/15/2016 0600   AST 25 05/15/2016 0600   ALT 23 05/15/2016 0600   ALKPHOS 209 (H) 05/15/2016 0600   BILITOT 0.6  05/15/2016 0600   GFRNONAA >60 05/15/2016 0600   GFRAA >60 05/15/2016 0600   Lipase     Component Value Date/Time   LIPASE 18 05/10/2016 0229       Studies/Results: Dg Chest Port 1 View  Result Date: 05/15/2016 CLINICAL DATA:  Dyspnea. History of hypertension and diabetes, remote history of smoking. EXAM: PORTABLE CHEST 1 VIEW COMPARISON:  Portable chest x-ray of May 12, 2016 FINDINGS: The lungs remain hypoinflated. The appearance is accentuated by the lordotic positioning. The interstitial markings are coarse. There is no alveolar infiltrate. There is no pleural effusion. The heart and pulmonary vascularity are normal. There is tortuosity of the ascending and descending thoracic aorta. The feeding tube tip projects below the inferior margin of the image. A pigtail catheter projects over the mid upper abdomen in the midline. IMPRESSION: Bilateral hypoinflation. Mild interstitial prominence in part due to vascular crowding but mild interstitial edema or infiltrate may be developing. No alveolar pneumonia. Electronically Signed   By: David  SwazilandJordan M.D.   On: 05/15/2016 08:07   Ct Image Guided Drainage By Percutaneous Catheter  Result Date: 05/13/2016 CLINICAL DATA:  Severe pancreatitis with infected necrosis of the entire pancreas and formation of infected pseudocyst. Referral for percutaneous drainage of infected pseudocyst given high risk currently for surgical debridement. EXAM: CT GUIDED CATHETER DRAINAGE  OF RETROPERITONEAL PANCREATIC ABSCESS ANESTHESIA/SEDATION: 1.0 mg IV Versed 25 mcg IV Fentanyl Total Moderate Sedation Time:  25 minutes The patient's level of consciousness and physiologic status were continuously monitored during the procedure by Radiology nursing. PROCEDURE: The procedure, risks, benefits, and alternatives were explained to the patient. Questions regarding the procedure were encouraged and answered. The patient understands and consents to the procedure. A time out  was performed prior to initiating the procedure. The left lateral abdominal wall was prepped with chlorhexidine in a sterile fashion, and a sterile drape was applied covering the operative field. A sterile gown and sterile gloves were used for the procedure. Local anesthesia was provided with 1% Lidocaine. CT was performed. After localizing a far left lateral retroperitoneal approach to the pancreas, an 18 gauge trocar needle was advanced under CT fluoroscopic guidance to the level of the infected pancreatic pseudocyst. After confirming needle tip position, a guidewire was advanced. The tract was dilated and a 14 French pigtail drainage catheter advanced. The catheter was formed and aspirated. A fluid sample was sent for culture analysis. The catheter was secured at the skin with a Prolene retention suture and StatLock device. It was connected to suction bulb drainage. COMPLICATIONS: None FINDINGS: Drainage catheter course was along the rough course of the tail of the pancreas with the drain terminating in the region of the body of the pancreas. The necrotic pancreas and infected pseudocyst yielded blood tinged, purulent fluid with debris. A sample was sent for culture analysis. IMPRESSION: CT-guided drainage of large region of pancreatic necrosis and infected pseudocyst formation. A 14 French percutaneous drain was placed from a left lateral retroperitoneal approach. The drain was attached to suction bulb drainage. A fluid sample was sent for culture analysis. Electronically Signed   By: Irish Lack M.D.   On: 05/13/2016 16:55    Anti-infectives: Anti-infectives    Start     Dose/Rate Route Frequency Ordered Stop   05/12/16 0000  vancomycin (VANCOCIN) IVPB 1000 mg/200 mL premix     1,000 mg 200 mL/hr over 60 Minutes Intravenous Every 12 hours 05/11/16 1322     05/11/16 1315  vancomycin (VANCOCIN) 500 mg in sodium chloride 0.9 % 100 mL IVPB     500 mg 100 mL/hr over 60 Minutes Intravenous STAT  05/11/16 1304 05/11/16 1558   05/10/16 1200  imipenem-cilastatin (PRIMAXIN) 500 mg in sodium chloride 0.9 % 100 mL IVPB     500 mg 200 mL/hr over 30 Minutes Intravenous Every 6 hours 05/10/16 0846     05/09/16 1200  cefTRIAXone (ROCEPHIN) 2 g in dextrose 5 % 50 mL IVPB  Status:  Discontinued     2 g 100 mL/hr over 30 Minutes Intravenous Every 24 hours 05/09/16 0801 05/09/16 1050   05/09/16 1200  cefTRIAXone (ROCEPHIN) 1 g in dextrose 5 % 50 mL IVPB  Status:  Discontinued     1 g 100 mL/hr over 30 Minutes Intravenous Every 24 hours 05/09/16 1050 05/10/16 0833   05/09/16 1100  vancomycin (VANCOCIN) IVPB 750 mg/150 ml premix  Status:  Discontinued     750 mg 150 mL/hr over 60 Minutes Intravenous Every 12 hours 05/09/16 1051 05/11/16 1322   05/05/16 1230  fluconazole (DIFLUCAN) IVPB 100 mg     100 mg 50 mL/hr over 60 Minutes Intravenous Every 24 hours 05/05/16 1135     05/03/16 1200  imipenem-cilastatin (PRIMAXIN) 500 mg in sodium chloride 0.9 % 100 mL IVPB  Status:  Discontinued  500 mg 200 mL/hr over 30 Minutes Intravenous Every 6 hours 05/03/16 1122 05/09/16 0801   05/03/16 0030  piperacillin-tazobactam (ZOSYN) IVPB 3.375 g  Status:  Discontinued     3.375 g 12.5 mL/hr over 240 Minutes Intravenous Every 8 hours 05/02/16 1647 05/03/16 1101   05/02/16 1930  fluconazole (DIFLUCAN) tablet 100 mg  Status:  Discontinued     100 mg Oral Daily 05/02/16 1816 05/05/16 1135   05/02/16 1600  piperacillin-tazobactam (ZOSYN) IVPB 3.375 g     3.375 g 100 mL/hr over 30 Minutes Intravenous  Once 05/02/16 1548 05/02/16 1720       Assessment/Plan  Gallstone pancreatitiswith infected pancreatic necrosis - NPO, Post pyloric tube feeds at 3950mL/hr, goal goal rate of 60 ml/hr, tolerating well, will d/c TPN and increase TF to 2970ml/hr tomorrow if he is still tolerating well  - Continue abx Primaxin 11/25>> - s/p pancreatic drainage per IR 11/28 with moderate amount of grey coarse drainage - WBC up to  18.4 yesterday, pending CBC today - Will continue for now with conservative mgt with drain, abx, tube feeds. Surgery via rp approach is still option moving forward as well.   Constipation:  - pending abd xray, - bowel regiment to include colace and miralax  Pulm: per primary, - Suspect developing pna chest x-ray shows left basilar atelectasis versus pneumonia - Continue vancomycin/imipenem, d/c Vanc in 24 h - pulmonary toilet   LOS: 13 days    Jerre SimonJessica L Focht , [facility] 05/15/2016, 8:47 AM Pager: [phone] Consults: [phone] Mon-Fri 7:00 am-4:30 pm Sat-Sun 7:00 am-11:30 am  3

## 2016-05-15 NOTE — Progress Notes (Signed)
CSW continuing to follow for possible transfer to [place] when stable  Burna SisJenna H. Kathline Banbury, LCSW Clinical Social Worker [phone]

## 2016-05-15 NOTE — Progress Notes (Addendum)
Nutrition Follow-up  DOCUMENTATION CODES:   Severe malnutrition in context of acute illness/injury  INTERVENTION:    Increase Jevity 1.2 formula to 60 ml/hr       Surgery to increase TF to goal rate of 70 ml/hr as tolerated 12/1       Discussed nutrition care plan with Dorethea ClanJessica, PA-C  Jevity 1.2 formula at 70 ml/hr to provide 2016 kcals, 93 gm protein, 1356 ml of free water  NUTRITION DIAGNOSIS:   Malnutrition related to acute illness as evidenced by severe depletion of body fat, severe depletion of muscle mass, energy intake < or equal to 50% for > or equal to 5 days, ongoing  GOAL:   Patient will meet greater than or equal to 90% of their needs, progressing  MONITOR:   TF tolerance, Labs, Weight trends, Skin, I & O's  ASSESSMENT:   82 year old male with a history of HTN, DM diet-controlled, hyperlipidemia. Patient was recently admitted with hemorrhagic pancreatitis secondary to gallbladder from 10/8-10/12. Patient was readmitted on 11/7-11/13 due to severe sepsis secondary to E coli and was [facility] on Augmentin. He's been continuing to become weak with nausea and vomiting and mild abdominal pain.  CORTRAK small bore feeding tube placed 11/18 (tip at duodenum). Pt with significant nausea and bilious emesis >> TPN started 11/20. Vital AF 1.2 formula initiated per RD at trickle rate of 10 ml/hr 11/21. Vital AF 1.2 formula held 11/24 and restarted at 20 ml/hr 11/25.  New formula of Jevity 1.2 formula initiated at 30 ml/hr 11/26.  S/p CT guided drainage of infected pancreatic pseudocyst 11/28.  Current TF regimen: Jevity 1.2 at 50 ml/hr providing 1440 kcals, 67 gm protein, 968 ml of free water >> TPN D/C'd.  Diet Order:  Diet NPO time specified  Skin:  MASD to groin, scrotum, and sacrum, dermatitis to groin and buttocks, excoriated sacrum, rash to groin/thigh  Last BM:  11/22  Height:  Ht Readings from Last 1 Encounters:  05/02/16 5\' 9"  (1.753 m)    Weight:  Wt Readings from Last 1 Encounters:  05/15/16 188 lb (85.3 kg)   Ideal Body Weight:  72.7 kg  BMI:  Body mass index is 27.76 kg/m.  Estimated Nutritional Needs:   Kcal:  1900-2100   Protein:  100-125 grams   Fluid:  > 1.9 L/day  EDUCATION NEEDS:   No education needs identified at this time  Maureen ChattersKatie Bibi Economos, RD, LDN Pager #: [phone] After-Hours Pager #: [phone]

## 2016-05-15 NOTE — Progress Notes (Signed)
[facility] to [facility]  Burna SisJenna H. Azul Brumett, LCSW Clinical Social Worker [phone]

## 2016-05-15 NOTE — Progress Notes (Signed)
PHARMACY - ADULT TOTAL PARENTERAL NUTRITION CONSULT NOTE   Pharmacy Consult:  TPN Indication:  Intolerance of enteral feeding  Patient Measurements: Height: '5\' 9"'$  (175.3 cm) Weight: 160 lb 11.5 oz (72.9 kg) IBW/kg (Calculated) : 70.7 TPN AdjBW (KG): 72.9 Body mass index is 23.73 kg/m.  Assessment:  8 YOM admitted 10/8-10/12/17 with hemorrhagic pancreatitis, readmitted 11/7-11/13/17 with severe Ecoli sepsis, and readmitted 05/02/16 with weakness, N/V, and abdominal pain.  CT showed intra-abdominal abscesses.  Patient has lost 20lb in the last 2 months and has severe malnutrition per RD assessment. Tube feeds were planned to start but patient with significant nausea and bilious emesis on 05/04/16 so pharmacy consulted to manage TPN.   GI: severe malnutrition with gallstone pancreatitis. Prealbumin improved to 5.8.  Creon (refusing), Protonix IV BID.  Drain placed 11/28 - output 61m Endo: hx DM on metformin PTA - CBGs acceptable, trending up Insulin requirements in the past 24 hours: 20 units in TPN bag + 12 units of SSI Lytes: low Na/CL, others WNL Renal: SCr stable, CrCL 80 ml/min - NS 10 ml/hr, good UOP 0.8 ml/kg/hr Pulm: stable on RA - Robitussin DM Cards: carotid artery stenosis, HTN, HLD - VSS, NSR on Lopressor, Lipitor Hepatobil: alk phos elevated, AST/ tbili / TG WNL Neuro: depression, anxiety.  Pain score 0-10, PRN morphine ID: Vanc/Primaxin/Fluc for E.coli abscess and possible necrotizing pancreatitis - afebrile, WBC up 18.4; purulent drainage from new back drain Best Practices: SCDs, mouth care TPN Access: PICC placed 05/03/16 TPN start date: 05/05/16  Nutritional Goals (per RD recommendation on 11/25): 1900-2100 kCal and 100-125 g of protein per day  Current Nutrition:  Clinimix = 845 kCal and 48 mg protein per day Jevity 1.2 at 50 ml/hr = 1440 kCal and 67 mg protein per day   Plan:  - Jevity 1.2 at 50 ml/hr per MD.  TF is providing 76% of minimal kCal and 67%  of minimal protein needs.  [facility] recommends discontinuing TPN once TF is able to meet ~60% of patient's needs.  Spoke to JHoven PUtah will d/c TPN post this bag. - Continue moderate SSI Q4H.  Patient may benefit from TF coverage (there was 20 units of regular insulin in TPN) - Change Protonix to PT - D/C TPN labs and nursing care orders   Annika Selke D. DMina Marble PharmD, BCPS Pager:  [phone]/30/2017, 9:46 AM

## 2016-05-15 NOTE — Plan of Care (Signed)
Problem: Nutrition: Goal: Adequate nutrition will be maintained Outcome: Progressing Patient's TPN was discontinued today at 1800. Patient now only receiving Tube Feedings for nutrition.  Problem: Bowel/Gastric: Goal: Will not experience complications related to bowel motility Outcome: Progressing Patient has been receiving bowel regimen to help him have a bowel movement. Successfully had two small-medium bowel movements today. Continuing to administer medications to prevent recurrence of constipation.

## 2016-05-16 LAB — COMPREHENSIVE METABOLIC PANEL
ALT: 25 U/L (ref 17–63)
AST: 33 U/L (ref 15–41)
Albumin: 1.4 g/dL — ABNORMAL LOW (ref 3.5–5.0)
Alkaline Phosphatase: 193 U/L — ABNORMAL HIGH (ref 38–126)
Anion gap: 6 (ref 5–15)
BILIRUBIN TOTAL: 0.5 mg/dL (ref 0.3–1.2)
BUN: 17 mg/dL (ref 6–20)
CALCIUM: 8.1 mg/dL — AB (ref 8.9–10.3)
CHLORIDE: 98 mmol/L — AB (ref 101–111)
CO2: 30 mmol/L (ref 22–32)
CREATININE: 0.76 mg/dL (ref 0.61–1.24)
Glucose, Bld: 154 mg/dL — ABNORMAL HIGH (ref 65–99)
Potassium: 3.9 mmol/L (ref 3.5–5.1)
Sodium: 134 mmol/L — ABNORMAL LOW (ref 135–145)
TOTAL PROTEIN: 5.1 g/dL — AB (ref 6.5–8.1)

## 2016-05-16 LAB — GLUCOSE, CAPILLARY
GLUCOSE-CAPILLARY: 117 mg/dL — AB (ref 65–99)
GLUCOSE-CAPILLARY: 160 mg/dL — AB (ref 65–99)
GLUCOSE-CAPILLARY: 180 mg/dL — AB (ref 65–99)
Glucose-Capillary: 166 mg/dL — ABNORMAL HIGH (ref 65–99)
Glucose-Capillary: 121 mg/dL — ABNORMAL HIGH (ref 65–99)
Glucose-Capillary: 154 mg/dL — ABNORMAL HIGH (ref 65–99)

## 2016-05-16 LAB — CBC
HEMATOCRIT: 31.2 % — AB (ref 39.0–52.0)
Hemoglobin: 10.3 g/dL — ABNORMAL LOW (ref 13.0–17.0)
MCH: 31.3 pg (ref 26.0–34.0)
MCHC: 33 g/dL (ref 30.0–36.0)
MCV: 94.8 fL (ref 78.0–100.0)
PLATELETS: 245 10*3/uL (ref 150–400)
RBC: 3.29 MIL/uL — AB (ref 4.22–5.81)
RDW: 14.5 % (ref 11.5–15.5)
WBC: 16.6 10*3/uL — ABNORMAL HIGH (ref 4.0–10.5)

## 2016-05-16 MED ORDER — JEVITY 1.2 CAL PO LIQD
1000.0000 mL | ORAL | Status: DC
  Administered 2016-05-16 – 2016-05-18 (×3): 1000 mL
  Administered 2016-05-19: 70 mL
  Administered 2016-05-19 – 2016-05-20 (×2): 1000 mL
  Filled 2016-05-16 (×13): qty 1000

## 2016-05-16 NOTE — Progress Notes (Signed)
Patient ID: Colin West, male   DOB: 22-Mar-1934, 82 y.o.   MRN: [id number]    Referring Physician(s): Dr. Emelia Loron  Supervising Physician: Gilmer Mor  Patient Status:  [facility] - In-pt  Chief Complaint:  Pancreatic pseudocyst  Subjective:  Patient continues with some discomfort at drain site.  Is alert and communicative this AM- asking when he can have something to drink and when the "tube can come out of my nose."  WBC decreased slightly today; remains afebrile. Remains NPO *Note patient has been transitioned to enteral feeds of Jevity 1.2 at goal rate. TPN discontinued as of yesterday. Cortrak tube in the jejunum. Not receiving Creon.   Allergies: Patient has no known allergies.  Medications: Prior to Admission medications   Medication Sig Start Date End Date Taking? Authorizing Provider  amoxicillin-clavulanate (AUGMENTIN) 875-125 MG tablet Take 1 tablet by mouth every 12 (twelve) hours. 04/28/16  Yes Marinda Elk, MD  atorvastatin (LIPITOR) 10 MG tablet Take 10 mg by mouth daily.   Yes Historical Provider, MD  famotidine (PEPCID) 20 MG tablet Take 1 tablet (20 mg total) by mouth 2 (two) times daily. 04/07/16  Yes Osvaldo Shipper, MD  feeding supplement, GLUCERNA SHAKE, (GLUCERNA SHAKE) LIQD Take 237 mLs by mouth 3 (three) times daily between meals. 04/07/16  Yes Osvaldo Shipper, MD  lipase/protease/amylase (CREON) 36000 UNITS CPEP capsule Take 1 capsule (36,000 Units total) by mouth 3 (three) times daily before meals. 04/07/16  Yes Osvaldo Shipper, MD  lisinopril (PRINIVIL,ZESTRIL) 2.5 MG tablet Take 1 tablet (2.5 mg total) by mouth daily. 04/29/16  Yes Marinda Elk, MD  metFORMIN (GLUCOPHAGE-XR) 500 MG 24 hr tablet Take 500 mg by mouth daily with breakfast.   Yes Historical Provider, MD  metoCLOPramide (REGLAN) 5 MG tablet Take 5-10 mg by mouth every 8 (eight) hours as needed (for hiccups).   Yes Historical Provider, MD  metoprolol (LOPRESSOR) 100 MG  tablet Take 1 tablet (100 mg total) by mouth 2 (two) times daily. 04/28/16  Yes Marinda Elk, MD  ondansetron (ZOFRAN) 4 MG tablet Take 4 mg by mouth every 8 (eight) hours as needed for nausea or vomiting.   Yes Historical Provider, MD  potassium chloride SA (K-DUR,KLOR-CON) 20 MEQ tablet Take 1 tablet (20 mEq total) by mouth daily. 04/07/16  Yes Osvaldo Shipper, MD  traMADol (ULTRAM) 50 MG tablet Take 1 tablet (50 mg total) by mouth every 6 (six) hours as needed for moderate pain. 04/28/16  Yes Marinda Elk, MD     Vital Signs: BP 122/66   Pulse 96   Temp 97.5 F (36.4 C) (Oral)   Resp (!) 25   Ht 5\' 9"  (1.753 m)   Wt 184 lb (83.5 kg)   SpO2 97%   BMI 27.17 kg/m   Physical Exam  Constitutional: He is oriented to person, place, and time. He appears well-developed. No distress.  Pulmonary/Chest: Effort normal. No respiratory distress.  Abdominal: He exhibits distension. There is tenderness (epigastric and left upper quadrant tenderness to light palpation. Drain in place with purulent output. Site c/d/i.).  Neurological: He is alert and oriented to person, place, and time.  Nursing note and vitals reviewed.   Imaging: Dg Chest Port 1 View  Result Date: 05/15/2016 CLINICAL DATA:  Dyspnea. History of hypertension and diabetes, remote history of smoking. EXAM: PORTABLE CHEST 1 VIEW COMPARISON:  Portable chest x-ray of May 12, 2016 FINDINGS: The lungs remain hypoinflated. The appearance is accentuated by the lordotic positioning. The  interstitial markings are coarse. There is no alveolar infiltrate. There is no pleural effusion. The heart and pulmonary vascularity are normal. There is tortuosity of the ascending and descending thoracic aorta. The feeding tube tip projects below the inferior margin of the image. A pigtail catheter projects over the mid upper abdomen in the midline. IMPRESSION: Bilateral hypoinflation. Mild interstitial prominence in part due to vascular  crowding but mild interstitial edema or infiltrate may be developing. No alveolar pneumonia. Electronically Signed   By: David  SwazilandJordan M.D.   On: 05/15/2016 08:07   Dg Chest Port 1 View  Result Date: 05/12/2016 CLINICAL DATA:  Cough, history of hypertension, hyperlipidemia, and diabetes. EXAM: PORTABLE CHEST 1 VIEW COMPARISON:  Portable chest x-ray of May 08, 2016 FINDINGS: The lung volumes remain low. There is increased density at the left lung base with new obscuration of the hemidiaphragm. The heart and pulmonary vascularity are normal. There is tortuosity of the ascending and descending thoracic aorta. The PICC line tip on the right projects over the midportion of the SVC. The feeding tube tip projects below the inferior margin of the image beyond the second portion of the duodenum. IMPRESSION: Increased density at the left lung base consistent with atelectasis or pneumonia with small left pleural effusion. Stable bilateral hypo nflation. Electronically Signed   By: David  [place]azil[place]Jordan M.D.   On: 05/12/2016 09:27   Dg Abd Portable 2v  Result Date: 05/15/2016 CLINICAL DATA:  Constipation EXAM: PORTABLE ABDOMEN - 2 VIEW COMPARISON:  CT abdomen 05/13/2016 FINDINGS: Feeding tube tip in the proximal jejunum. Pigtail drainage catheter in the epigastric region in the region of the pancreas. Mild to moderate amount of stool in the colon. Negative for bowel obstruction. No free air on the decubitus view. IMPRESSION: Mild moderate stool in the colon without bowel obstruction or perforation Feeding tube tip in the jejunum Pigtail drainage catheter in the pancreas region. Electronically Signed   By: Marlan Palauharles  Clark M.D.   On: 05/15/2016 10:11   Ct Image Guided Drainage By Percutaneous Catheter  Result Date: 05/13/2016 CLINICAL DATA:  Severe pancreatitis with infected necrosis of the entire pancreas and formation of infected pseudocyst. Referral for percutaneous drainage of infected pseudocyst given high risk  currently for surgical debridement. EXAM: CT GUIDED CATHETER DRAINAGE OF RETROPERITONEAL PANCREATIC ABSCESS ANESTHESIA/SEDATION: 1.0 mg IV Versed 25 mcg IV Fentanyl Total Moderate Sedation Time:  25 minutes The patient's level of consciousness and physiologic status were continuously monitored during the procedure by Radiology nursing. PROCEDURE: The procedure, risks, benefits, and alternatives were explained to the patient. Questions regarding the procedure were encouraged and answered. The patient understands and consents to the procedure. A time out was performed prior to initiating the procedure. The left lateral abdominal wall was prepped with chlorhexidine in a sterile fashion, and a sterile drape was applied covering the operative field. A sterile gown and sterile gloves were used for the procedure. Local anesthesia was provided with 1% Lidocaine. CT was performed. After localizing a far left lateral retroperitoneal approach to the pancreas, an 18 gauge trocar needle was advanced under CT fluoroscopic guidance to the level of the infected pancreatic pseudocyst. After confirming needle tip position, a guidewire was advanced. The tract was dilated and a 14 French pigtail drainage catheter advanced. The catheter was formed and aspirated. A fluid sample was sent for culture analysis. The catheter was secured at the skin with a Prolene retention suture and StatLock device. It was connected to suction bulb drainage. COMPLICATIONS: None FINDINGS:  Drainage catheter course was along the rough course of the tail of the pancreas with the drain terminating in the region of the body of the pancreas. The necrotic pancreas and infected pseudocyst yielded blood tinged, purulent fluid with debris. A sample was sent for culture analysis. IMPRESSION: CT-guided drainage of large region of pancreatic necrosis and infected pseudocyst formation. A 14 French percutaneous drain was placed from a left lateral retroperitoneal approach.  The drain was attached to suction bulb drainage. A fluid sample was sent for culture analysis. Electronically Signed   By: Irish LackGlenn  Yamagata M.D.   On: 05/13/2016 16:55    Labs:  CBC:  Recent Labs  05/12/16 0541 05/14/16 0229 05/15/16 0912 05/16/16 0248  WBC 16.0* 18.4* 18.6* 16.6*  HGB 10.5* 10.7* 11.0* 10.3*  HCT 32.9* 32.8* 33.2* 31.2*  PLT 192 213 263 245    COAGS:  Recent Labs  03/27/16 0500 04/23/16 0507 05/03/16 0242 05/12/16 1415  INR 1.23 1.39 1.43 1.28    BMP:  Recent Labs  05/12/16 0541 05/14/16 0229 05/15/16 0600 05/16/16 0248  NA 134* 131* 131* 134*  K 3.8 4.5 4.0 3.9  CL 103 99* 100* 98*  CO2 28 28 26 30   GLUCOSE 154* 153* 158* 154*  BUN 21* 16 18 17   CALCIUM 8.1* 8.1* 8.0* 8.1*  CREATININE 0.86 0.75 0.63 0.76  GFRNONAA >60 >60 >60 >60  GFRAA >60 >60 >60 >60    LIVER FUNCTION TESTS:  Recent Labs  05/12/16 0541 05/14/16 0229 05/15/16 0600 05/16/16 0248  BILITOT 0.7 0.9 0.6 0.5  AST 45* 35 25 33  ALT 39 32 23 25  ALKPHOS 299* 261* 209* 193*  PROT 5.4* 5.0* 5.1* 5.1*  ALBUMIN 1.5* 1.5* 1.4* 1.4*    Assessment and Plan:  Pancreatic pseudocyst s/p drainage 11/28 with pigtail drain in place.  Cultures still pending; but growing E Coli Continue drain irrigation.  Following with [facility] for any additional plans.  Follow-up CT once drain output minimal   Electronically Signed: Hoyt KochKacie Sue-Ellen Matthews 05/16/2016, 8:31 AM   I spent a total of 15 Minutes at the the patient's bedside AND on the patient's hospital floor or unit, greater than 50% of which was counseling/coordinating care for pancreatic pseudocyst

## 2016-05-16 NOTE — Progress Notes (Signed)
Physical Therapy Treatment Patient Details Name: Colin West N Guiles MRN: [id number] DOB: 01/14/1934 Today's Date: 05/16/2016    History of Present Illness Pt readmitted with pancreatic abcessess. Pt with 2 recent admissions for hemorrhagic gallstone pancreatitis and sepsis. PMH - HTN, DM    PT Comments    Pt continues to make slow, steady progress.  Follow Up Recommendations  SNF     Equipment Recommendations  None recommended by PT    Recommendations for Other Services       Precautions / Restrictions Precautions Precautions: Fall Restrictions Weight Bearing Restrictions: No    Mobility  Bed Mobility Overal bed mobility: Needs Assistance Bed Mobility: Supine to Sit     Supine to sit: Min assist;HOB elevated     General bed mobility comments: Assist to elevate trunk into sitting  Transfers Overall transfer level: Needs assistance Equipment used: Rolling walker (2 wheeled) Transfers: Sit to/from Stand Sit to Stand: Min assist;+2 safety/equipment         General transfer comment: Assist to bring hips up and for balance  Ambulation/Gait Ambulation/Gait assistance: Min assist;+2 safety/equipment Ambulation Distance (Feet): 125 Feet (125' x 1, 40' x 1) Assistive device: Rolling walker (2 wheeled) Gait Pattern/deviations: Step-through pattern;Decreased step length - right;Decreased step length - left;Trunk flexed Gait velocity: decr   General Gait Details: Assist for balance and support. Followed closely by second person with a chair. Verbal cues to stand more erect. Sitting rest break of 3-4 minutes between walks   Stairs            Wheelchair Mobility    Modified Rankin (Stroke Patients Only)       Balance Overall balance assessment: Needs assistance Sitting-balance support: No upper extremity supported Sitting balance-Leahy Scale: Fair     Standing balance support: Bilateral upper extremity supported Standing balance-Leahy Scale:  Poor Standing balance comment: Walker and min A for static standing                    Cognition Arousal/Alertness: Awake/alert Behavior During Therapy: WFL for tasks assessed/performed;Flat affect Overall Cognitive Status: Within Functional Limits for tasks assessed                      Exercises      General Comments        Pertinent Vitals/Pain Pain Assessment: No/denies pain    Home Living                      Prior Function            PT Goals (current goals can now be found in the care plan section) Acute Rehab PT Goals PT Goal Formulation: With patient Time For Goal Achievement: 05/30/16 Potential to Achieve Goals: Good Progress towards PT goals: Progressing toward goals    Frequency    Min 2X/week      PT Plan Current plan remains appropriate;Frequency needs to be updated    Co-evaluation             End of Session Equipment Utilized During Treatment: Gait belt Activity Tolerance: Patient tolerated treatment well Patient left: in chair;with call bell/phone within reach;with chair alarm set     Time: [id number] PT Time Calculation (min) (ACUTE ONLY): 28 min  Charges:  $Gait Training: 23-37 mins                    G Codes:      Tandy Grawe W  [place] 05/16/2016, 10:03 AM Skip Mayerary Daryan Buell PT [phone]

## 2016-05-16 NOTE — Progress Notes (Signed)
[facility] Progress Note     Subjective: Pt was up walking with PT today. He states continued abdominal pain and bloating. Pt states 3 BMs yesterday. Denies N/V.  Objective: Vital signs in last 24 hours: Temp:  [97.2 F (36.2 C)-98.5 F (36.9 C)] 97.2 F (36.2 C) (12/01 0300) Pulse Rate:  [73-103] 90 (12/01 0400) Resp:  [16-25] 22 (12/01 0400) BP: (106-167)/(64-97) 143/69 (12/01 0400) SpO2:  [95 %-98 %] 96 % (12/01 0400) Weight:  [184 lb (83.5 kg)] 184 lb (83.5 kg) (12/01 0300) Last BM Date: 05/15/16  Intake/Output from previous day: 11/30 0701 - 12/01 0700 In: 2293 [I.V.:430; NG/GT:1253; IV Piggyback:600] Out: 1290 [Urine:1250; Drains:40] Intake/Output this shift: No intake/output data recorded.  PE: Gen:  Alert, NAD, pleasant, sitting up in chair, tired looking Card:  RRR, no M/G/R heard, 2+ radial pulses bilaterally Pulm:  Effort normal Abd: slightly firm, mildly distented, +BS, generalized TTP, drain with grey fluid  Lab Results:   Recent Labs  05/15/16 0912 05/16/16 0248  WBC 18.6* 16.6*  HGB 11.0* 10.3*  HCT 33.2* 31.2*  PLT 263 245   BMET  Recent Labs  05/15/16 0600 05/16/16 0248  NA 131* 134*  K 4.0 3.9  CL 100* 98*  CO2 26 30  GLUCOSE 158* 154*  BUN 18 17  CREATININE 0.63 0.76  CALCIUM 8.0* 8.1*   PT/INR No results for input(s): LABPROT, INR in the last 72 hours. CMP     Component Value Date/Time   NA 134 (L) 05/16/2016 0248   NA 136 (A) 05/01/2016   K 3.9 05/16/2016 0248   CL 98 (L) 05/16/2016 0248   CO2 30 05/16/2016 0248   GLUCOSE 154 (H) 05/16/2016 0248   BUN 17 05/16/2016 0248   BUN 23 (A) 05/01/2016   CREATININE 0.76 05/16/2016 0248   CALCIUM 8.1 (L) 05/16/2016 0248   PROT 5.1 (L) 05/16/2016 0248   ALBUMIN 1.4 (L) 05/16/2016 0248   AST 33 05/16/2016 0248   ALT 25 05/16/2016 0248   ALKPHOS 193 (H) 05/16/2016 0248   BILITOT 0.5 05/16/2016 0248   GFRNONAA >60 05/16/2016 0248   GFRAA >60 05/16/2016 0248    Lipase     Component Value Date/Time   LIPASE 18 05/10/2016 0229       Studies/Results: Dg Chest Port 1 View  Result Date: 05/15/2016 CLINICAL DATA:  Dyspnea. History of hypertension and diabetes, remote history of smoking. EXAM: PORTABLE CHEST 1 VIEW COMPARISON:  Portable chest x-ray of May 12, 2016 FINDINGS: The lungs remain hypoinflated. The appearance is accentuated by the lordotic positioning. The interstitial markings are coarse. There is no alveolar infiltrate. There is no pleural effusion. The heart and pulmonary vascularity are normal. There is tortuosity of the ascending and descending thoracic aorta. The feeding tube tip projects below the inferior margin of the image. A pigtail catheter projects over the mid upper abdomen in the midline. IMPRESSION: Bilateral hypoinflation. Mild interstitial prominence in part due to vascular crowding but mild interstitial edema or infiltrate may be developing. No alveolar pneumonia. Electronically Signed   By: David  Swazil[place] M.D.   On: 05/15/2016 08:07   Dg Abd Portable 2v  Result Date: 05/15/2016 CLINICAL DATA:  Constipation EXAM: PORTABLE ABDOMEN - 2 VIEW COMPARISON:  CT abdomen 05/13/2016 FINDINGS: Feeding tube tip in the proximal jejunum. Pigtail drainage catheter in the epigastric region in the region of the pancreas. Mild to moderate amount of stool in the colon. Negative for bowel obstruction. No free air  on the decubitus view. IMPRESSION: Mild moderate stool in the colon without bowel obstruction or perforation Feeding tube tip in the jejunum Pigtail drainage catheter in the pancreas region. Electronically Signed   By: Marlan Palauharles  Clark M.D.   On: 05/15/2016 10:11    Anti-infectives: Anti-infectives    Start     Dose/Rate Route Frequency Ordered Stop   05/12/16 0000  vancomycin (VANCOCIN) IVPB 1000 mg/200 mL premix  Status:  Discontinued     1,000 mg 200 mL/hr over 60 Minutes Intravenous Every 12 hours 05/11/16 1322 05/15/16 1503    05/11/16 1315  vancomycin (VANCOCIN) 500 mg in sodium chloride 0.9 % 100 mL IVPB     500 mg 100 mL/hr over 60 Minutes Intravenous STAT 05/11/16 1304 05/11/16 1558   05/10/16 1200  imipenem-cilastatin (PRIMAXIN) 500 mg in sodium chloride 0.9 % 100 mL IVPB     500 mg 200 mL/hr over 30 Minutes Intravenous Every 6 hours 05/10/16 0846     05/09/16 1200  cefTRIAXone (ROCEPHIN) 2 g in dextrose 5 % 50 mL IVPB  Status:  Discontinued     2 g 100 mL/hr over 30 Minutes Intravenous Every 24 hours 05/09/16 0801 05/09/16 1050   05/09/16 1200  cefTRIAXone (ROCEPHIN) 1 g in dextrose 5 % 50 mL IVPB  Status:  Discontinued     1 g 100 mL/hr over 30 Minutes Intravenous Every 24 hours 05/09/16 1050 05/10/16 0833   05/09/16 1100  vancomycin (VANCOCIN) IVPB 750 mg/150 ml premix  Status:  Discontinued     750 mg 150 mL/hr over 60 Minutes Intravenous Every 12 hours 05/09/16 1051 05/11/16 1322   05/05/16 1230  fluconazole (DIFLUCAN) IVPB 100 mg  Status:  Discontinued     100 mg 50 mL/hr over 60 Minutes Intravenous Every 24 hours 05/05/16 1135 05/15/16 1503   05/03/16 1200  imipenem-cilastatin (PRIMAXIN) 500 mg in sodium chloride 0.9 % 100 mL IVPB  Status:  Discontinued     500 mg 200 mL/hr over 30 Minutes Intravenous Every 6 hours 05/03/16 1122 05/09/16 0801   05/03/16 0030  piperacillin-tazobactam (ZOSYN) IVPB 3.375 g  Status:  Discontinued     3.375 g 12.5 mL/hr over 240 Minutes Intravenous Every 8 hours 05/02/16 1647 05/03/16 1101   05/02/16 1930  fluconazole (DIFLUCAN) tablet 100 mg  Status:  Discontinued     100 mg Oral Daily 05/02/16 1816 05/05/16 1135   05/02/16 1600  piperacillin-tazobactam (ZOSYN) IVPB 3.375 g     3.375 g 100 mL/hr over 30 Minutes Intravenous  Once 05/02/16 1548 05/02/16 1720       Assessment/Plan Gallstone pancreatitiswith infected pancreatic necrosis - NPO, Post pyloric tube feeds at goal rate of 70 ml/hr, tolerating well, d/c'd TPN yesterday  - Continue abx Primaxin  11/25>> - s/p pancreatic drainage per IR 11/28 with moderate amount of grey coarse drainage - WBC down today from yesterday, CBC tomorrow AM - Will continue for now with conservative mgt with drain, abx, tube feeds. Surgery via rp approach is still option moving forward as well.   Constipation:  - abd xray yesterday with moderate stool in colon without bowel obstruction or perforation, 3 BM's yesterday - continue bowel regiment to include colace, miralax and suppository   Pulm: per primary, - Suspect developing pna chest x-ray shows left basilar atelectasis versus pneumonia - Continue vancomycin/imipenem, d/c Vanc in 24 h - pulmonary toilet    LOS: 14 days    Jerre SimonJessica L Lamar Meter , [facility] 05/16/2016,  8:11 AM Pager: [phone] Consults: [phone] Mon-Fri 7:00 am-4:30 pm Sat-Sun 7:00 am-11:30 am

## 2016-05-16 NOTE — Plan of Care (Signed)
Problem: Pain Managment: Goal: General experience of comfort will improve Outcome: Progressing Patient requesting pain medication less frequently.   Problem: Skin Integrity: Goal: Risk for impaired skin integrity will decrease Outcome: Progressing Patient encouraged to be active and turn/ reposition every 2 hours.  Problem: Activity: Goal: Risk for activity intolerance will decrease Outcome: Progressing Patient walked with PT to end of hall and was wheeled back to room. He sat up for 2.5 hours and went back to bed.   Problem: Nutrition: Goal: Adequate nutrition will be maintained Outcome: Progressing Goal for tube feeding of 70 was reached today.  Problem: Bowel/Gastric: Goal: Will not experience complications related to bowel motility Outcome: Progressing Patient continuing to receive stool softeners and laxatives to keep him regular. He had another bowel movement today.

## 2016-05-16 NOTE — Progress Notes (Signed)
Pharmacy Antibiotic Note Colin West is a 82 y.o. male admitted on 05/02/2016 with pancreatic abscesses that is currently on day 7 of Primaxin (restart).  Culture from pancreatic pseudocyst has grown E.coli.   Plan: 1. Continue Primaxin 500mg  IV Q6 per surgery preference  2. F/U C&S, abx deescalation / LOT 3. Consider deescalating from Primaxin if appropriate    Height: 5\' 9"  (175.3 cm) Weight: 184 lb (83.5 kg) IBW/kg (Calculated) : 70.7  Temp (24hrs), Avg:98.1 F (36.7 C), Min:97.2 F (36.2 C), Max:98.5 F (36.9 C)   Recent Labs Lab 05/10/16 0229 05/11/16 1029 05/12/16 0541 05/14/16 0229 05/15/16 0600 05/15/16 0912 05/16/16 0248  WBC 14.1*  --  16.0* 18.4*  --  18.6* 16.6*  CREATININE 0.64  --  0.86 0.75 0.63  --  0.76  VANCOTROUGH  --  13*  --   --   --   --   --     Estimated Creatinine Clearance: 73.6 mL/min (by C-G formula based on SCr of 0.76 mg/dL).    No Known Allergies  Antimicrobials this admission:  Zosyn 11/17 >> 11/18 Fluc 11/17>>11/20 Ceftriaxone 11/24 >>11/25 Vancomycin 11/24 >>11/30  Primaxin 11/18>> 11/24, 11/25 >>  Dose adjustments this admission:  11/26 VT = 13 on 750mg  q12h: (Increased to 1g IV Q12)  Microbiology results:  11/17 BCx: neg 11/17 UCx: neg  11/18 Abscess from drain: Ecoli (S- imipenem, cefazolin, CTX) 11/17 MRSA PCR: neg 11/28 Abscess Cx: E.coli R to amp, amp/sulb, cipro, bactrim and S to all other agents    Thank you for allowing pharmacy to be a part of this patient's care.   Colin SamplesAndy Ivannia West, PharmD, BCPS 05/16/2016, 8:03 AM Pager: [phone]

## 2016-05-16 NOTE — Progress Notes (Signed)
PROGRESS NOTE  Colin West ZOX:[id number] DOB: 08/29/1933 DOA: 05/02/2016 PCP: [name], MD   LOS: 14 days   Brief Narrative: Glorious Peach a 82 y.o.malewith complex history recently hospitalized from 10/8-10/12 for hemorrhagic pancreatitis from gallblader source, and on 11/7-11/13 for Severe Sepsis due to E.Coli pyelonephritis, discharged to [facility] on oral antibiotics, brought to the emergency department with worsening generalized weakness, 11/17   with associated nausea and vomiting.   lost about 15 lbs in the last 2 months. CT scan shows multiple abscesses although not very well-developed, lots of air bubbles. Possible infected pancreatic necrosis. Patient found to have multiple peripancreatic abscesses, now scheduled for IR CT-guided drain placement, received drainage by IR x 2 on 11/18 and 11/28.  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 - CT scan showed multiple peripancreatic abscesses, was recently in the hospital from 10/8-10/12 for hemorrhagic pancreatitis from gallbladder source. - Currently on Primaxin and Diflucan for pancreatitis, vancomycin for possible HCAP, - Pancreatic abscess aspiration now growing Escherichia coli.  - General surgery recommends to continue Primaxin - Patient developed pancreatic necrosis while he was eating, need to be strict NPO with postpyloric feeding. - patient refuses to go  back to SNF in the interval that he is waiting for surgery - Cardiology reconsulted 11/22 - if patient requires laparoscopic surgery - moderate risk, if patient requires open surgery, it would be considered high risk, at this point cannot rule out ischemia, though no further cardiac testing recommended. Continue beta blocker. Proceed with surgery if indicated - Repeat 2-D echo shows mild improvement in EF to 35-40%.  - discuss with surgery, for now recommending close inpatient monitoring for response following drain placement and whether patient will need further surgery for his abscess. Will need repeat CT abdomen next week  Cough /probable HCAP - Suspect developing pna chest x-ray shows left basilar atelectasis versus pneumonia - Continue vancomycin/imipenem through today given increase in WBC - Counseled RN to maintain strict aspiration precautions and pulmonary toilet, leukocytosis improving  Protein calorie malnutrition, severe  - Lost 20 pounds in the past 2 months, albumin continues to decline - Per general surgery recommendation try postpyloric enteral nutrition prior to TPN. - Currently on both TPN and enteric feeding held because of ileus, now resumed - CORTRAK, in place  Atrial fibrillation, paroxysmal - Transient atrial fibrillation with heart rate 130s, back to sinus rhythm with IV/by mouth metoprolol. Continue oral metoprolol - TSH  2.319 - Cardiology has intermittently followed the patient during this hospitalization and during previous hospitalizations, defer anticoagulation to them   Type II Diabetes  - CBGs controlled 120-140s  Hypertension stable - Controlled - Continue home anti-hypertensive medications   Hiccups - This is likely secondary to the irritation of the diaphragm from pancreatic abscess, on Reglan. Stable today   Hyperlipidemia - Continue home statins  Acute on chronic systolic CHF in the setting of dilated Cardiomyopathy in view of recent  sepsis - 2 D echo recently shows EF 30-35 % 11/10,  repeat echo unchanged - Cautiously monitor fluid status, monitor weights, appears to have gained 1lb. H is up 17L, CXR with fluid overload, give Lasix 40 mg iv x 1  Encephalopathy - Minimize narcotics, resolved   DVT prophylaxis: SCDs Code Status: Full code Family Communication: no family at bedside Disposition Plan: not ready for D/C  Consultants:   General surgery   Interventional Radiology  Cardiology  Procedures:   2D echo 05/08/16 Impressions: - Mild LVH with LVEF approximately 35-40%, diffuse hypokinesis. Grade 1 diastolic dysfunction. Mitral annular calcification with mild mitral regurgitation. Mildly sclerotic aortic valve. Trivial tricuspid regurgitation.  Peripancreatic fluid collection aspiration 11/18 >> growing E coli (resistant with Ampicillin and bactrim)  CT-guided drainage of large region of pancreatic necrosis and infected pseudocyst formation. A 14 French 11/28 >> preliminary growing Gram negative rods   Antimicrobials:  Imipenem 11/18 >>   Vancomycin 11/24 >>11/30  Fluconazole 11/20 >> 11/30  Subjective: - complains of left flank pain where drain is in place  Objective: Vitals:   05/16/16 0800 05/16/16 0816 05/16/16 1004 05/16/16 1253  BP: 122/66 122/66 133/68 102/60  Pulse: 88 96 100 76  Resp: 18 (!) 25  16  Temp:  97.5 F (36.4 C)  98.3 F (36.8 C)  TempSrc:  Oral  Oral  SpO2: 96% 97%  96%  Weight:      Height:        Intake/Output Summary (Last 24 hours) at 05/16/16 1333 Last data filed at 05/16/16 1024  Gross per 24 hour  Intake             2000 ml  Output             1790 ml  Net              210 ml   Filed Weights   05/14/16 0500 05/15/16 0300 05/16/16 0300  Weight: (!) 187 kg (412 lb 4.2 oz) 85.3 kg (188 lb) 83.5 kg (184 lb)    Examination: Constitutional: NAD, ill appearing, NG tube in place Vitals:   05/16/16 0800 05/16/16 0816 05/16/16 1004 05/16/16 1253  BP:  122/66 122/66 133/68 102/60  Pulse: 88 96 100 76  Resp: 18 (!) 25  16  Temp:  97.5 F (36.4 C)  98.3 F (36.8 C)  TempSrc:  Oral  Oral  SpO2: 96% 97%  96%  Weight:      Height:       Eyes: PERRL, lids and conjunctivae normal ENMT: Mucous membranes are moist. Respiratory: clear to auscultation bilaterally, no wheezing, no crackles. Normal respiratory effort.  Cardiovascular: Regular rate and rhythm, no murmurs / rubs / gallops. Trace LE edema. 2+ pedal pulses. Abdomen: no tenderness. Bowel sounds positive. Left flank dressing c/d/i, drain in place Neurologic: non focal   Data Reviewed: I have personally reviewed following labs and imaging studies  CBC:  Recent Labs Lab 05/10/16 0229 05/12/16 0541 05/14/16 0229 05/15/16 0912 05/16/16 0248  WBC 14.1* 16.0* 18.4* 18.6* 16.6*  NEUTROABS  --  10.9*  --   --   --   HGB 10.8* 10.5* 10.7* 11.0* 10.3*  HCT 32.2* 32.9* 32.8* 33.2* 31.2*  MCV 95.0 96.5 95.6 94.3 94.8  PLT 171 192 213 263 245   Basic Metabolic Panel:  Recent Labs Lab 05/10/16 0229 05/12/16 0541 05/14/16 0229 05/15/16 0600 05/16/16 0248  NA 135 134* 131* 131* 134*  K 3.7 3.8 4.5 4.0 3.9  CL 105 103 99* 100* 98*  CO2 25  28 28 26 30   GLUCOSE 112* 154* 153* 158* 154*  BUN 20 21* 16 18 17   CREATININE 0.64 0.86 0.75 0.63 0.76  CALCIUM 8.0* 8.1* 8.1* 8.0* 8.1*  MG 1.8 1.9  --  1.9  --   PHOS 3.2 3.0  --  2.9  --    GFR: Estimated Creatinine Clearance: 73.6 mL/min (by C-G formula based on SCr of 0.76 mg/dL). Liver Function Tests:  Recent Labs Lab 05/10/16 0229 05/12/16 0541 05/14/16 0229 05/15/16 0600 05/16/16 0248  AST 53* 45* 35 25 33  ALT 43 39 32 23 25  ALKPHOS 257* 299* 261* 209* 193*  BILITOT 0.6 0.7 0.9 0.6 0.5  PROT 4.9* 5.4* 5.0* 5.1* 5.1*  ALBUMIN 1.6* 1.5* 1.5* 1.4* 1.4*    Recent Labs Lab 05/10/16 0229  LIPASE 18    Recent Labs Lab 05/11/16 0223  AMMONIA 19   Coagulation Profile:  Recent Labs Lab 05/12/16 1415  INR  1.28   CBG:  Recent Labs Lab 05/15/16 2037 05/16/16 0013 05/16/16 0343 05/16/16 0814 05/16/16 1251  GLUCAP 158* 160* 117* 166* 180*   Urine analysis:    Component Value Date/Time   COLORURINE AMBER (A) 05/02/2016 1149   APPEARANCEUR CLOUDY (A) 05/02/2016 1149   LABSPEC 1.024 05/02/2016 1149   PHURINE 6.0 05/02/2016 1149   GLUCOSEU NEGATIVE 05/02/2016 1149   HGBUR NEGATIVE 05/02/2016 1149   BILIRUBINUR SMALL (A) 05/02/2016 1149   KETONESUR NEGATIVE 05/02/2016 1149   PROTEINUR NEGATIVE 05/02/2016 1149   UROBILINOGEN 0.2 03/08/2010 1026   NITRITE NEGATIVE 05/02/2016 1149   LEUKOCYTESUR SMALL (A) 05/02/2016 1149   Sepsis Labs: Invalid input(s): PROCALCITONIN, LACTICIDVEN  Recent Results (from the past 240 hour(s))  Aerobic/Anaerobic Culture (surgical/deep wound)     Status: None (Preliminary result)   Collection Time: 05/13/16  1:57 PM  Result Value Ref Range Status   Specimen Description ABSCESS  Final   Special Requests PANCREATIC PSEUDOCYST  Final   Gram Stain   Final    ABUNDANT WBC PRESENT,BOTH PMN AND MONONUCLEAR NO ORGANISMS SEEN    Culture   Final    FEW ESCHERICHIA COLI NO ANAEROBES ISOLATED; CULTURE IN PROGRESS FOR 5 DAYS    Report Status PENDING  Incomplete   Organism ID, Bacteria ESCHERICHIA COLI  Final      Susceptibility   Escherichia coli - MIC*    AMPICILLIN >=32 RESISTANT Resistant     CEFAZOLIN 8 SENSITIVE Sensitive     CEFEPIME <=1 SENSITIVE Sensitive     CEFTAZIDIME <=1 SENSITIVE Sensitive     CEFTRIAXONE <=1 SENSITIVE Sensitive     CIPROFLOXACIN >=4 RESISTANT Resistant     GENTAMICIN <=1 SENSITIVE Sensitive     IMIPENEM 0.5 SENSITIVE Sensitive     TRIMETH/SULFA >=320 RESISTANT Resistant     AMPICILLIN/SULBACTAM >=32 RESISTANT Resistant     PIP/TAZO 8 SENSITIVE Sensitive     Extended ESBL NEGATIVE Sensitive     * FEW ESCHERICHIA COLI      Radiology Studies: Dg Chest Port 1 View  Result Date: 05/15/2016 CLINICAL DATA:  Dyspnea.  History of hypertension and diabetes, remote history of smoking. EXAM: PORTABLE CHEST 1 VIEW COMPARISON:  Portable chest x-ray of May 12, 2016 FINDINGS: The lungs remain hypoinflated. The appearance is accentuated by the lordotic positioning. The interstitial markings are coarse. There is no alveolar infiltrate. There is no pleural effusion. The heart and pulmonary vascularity are normal. There is tortuosity of the ascending and descending thoracic aorta. The feeding tube tip  projects below the inferior margin of the image. A pigtail catheter projects over the mid upper abdomen in the midline. IMPRESSION: Bilateral hypoinflation. Mild interstitial prominence in part due to vascular crowding but mild interstitial edema or infiltrate may be developing. No alveolar pneumonia. Electronically Signed   By: David  [place] M.D.   On: 05/15/2016 08:07   Dg Abd Portable 2v  Result Date: 05/15/2016 CLINICAL DATA:  Constipation EXAM: PORTABLE ABDOMEN - 2 VIEW COMPARISON:  CT abdomen 05/13/2016 FINDINGS: Feeding tube tip in the proximal jejunum. Pigtail drainage catheter in the epigastric region in the region of the pancreas. Mild to moderate amount of stool in the colon. Negative for bowel obstruction. No free air on the decubitus view. IMPRESSION: Mild moderate stool in the colon without bowel obstruction or perforation Feeding tube tip in the jejunum Pigtail drainage catheter in the pancreas region. Electronically Signed   By: Marlan Palau M.D.   On: 05/15/2016 10:11   Scheduled Meds: . atorvastatin  10 mg Oral q1800  . bisacodyl  5 mg Rectal Once  . chlorhexidine  15 mL Mouth Rinse BID  . docusate  50 mg Per Tube Daily  . guaiFENesin-dextromethorphan  5 mL Oral Q8H  . imipenem-cilastatin  500 mg Intravenous Q6H  . insulin aspart  0-15 Units Subcutaneous Q4H  . mouth rinse  15 mL Mouth Rinse q12n4p  . metoprolol  100 mg Oral BID  . pantoprazole sodium  40 mg Per Tube BID  . polyethylene glycol  17 g  Oral Daily  . sodium chloride flush  10-40 mL Intracatheter Q12H  . sodium chloride flush  3 mL Intravenous Q12H   Continuous Infusions: . sodium chloride 10 mL/hr at 05/09/16 1039  . feeding supplement (JEVITY 1.2 CAL) 1,000 mL (05/16/16 1315)   Pamella Pert, MD, PhD [facility] Pager [phone]  If 7PM-7AM, please contact night-coverage [email or web address] Password [email or web address] 05/16/2016, 1:33 PM

## 2016-05-17 LAB — CBC
HCT: 29.9 % — ABNORMAL LOW (ref 39.0–52.0)
Hemoglobin: 9.8 g/dL — ABNORMAL LOW (ref 13.0–17.0)
MCH: 30.7 pg (ref 26.0–34.0)
MCHC: 32.8 g/dL (ref 30.0–36.0)
MCV: 93.7 fL (ref 78.0–100.0)
PLATELETS: 259 10*3/uL (ref 150–400)
RBC: 3.19 MIL/uL — ABNORMAL LOW (ref 4.22–5.81)
RDW: 14.4 % (ref 11.5–15.5)
WBC: 14.2 10*3/uL — ABNORMAL HIGH (ref 4.0–10.5)

## 2016-05-17 LAB — GLUCOSE, CAPILLARY
GLUCOSE-CAPILLARY: 125 mg/dL — AB (ref 65–99)
GLUCOSE-CAPILLARY: 131 mg/dL — AB (ref 65–99)
GLUCOSE-CAPILLARY: 142 mg/dL — AB (ref 65–99)
GLUCOSE-CAPILLARY: 155 mg/dL — AB (ref 65–99)
Glucose-Capillary: 129 mg/dL — ABNORMAL HIGH (ref 65–99)
Glucose-Capillary: 154 mg/dL — ABNORMAL HIGH (ref 65–99)
Glucose-Capillary: 161 mg/dL — ABNORMAL HIGH (ref 65–99)
Glucose-Capillary: 165 mg/dL — ABNORMAL HIGH (ref 65–99)

## 2016-05-17 NOTE — Progress Notes (Signed)
[facility] Progress Note     Subjective: Alert today. Minimal discomfort. Tolerating tube feeds and having bowel function.   Objective: Vital signs in last 24 hours: Temp:  [97.6 F (36.4 C)-98.6 F (37 C)] 98.3 F (36.8 C) (12/02 1209) Pulse Rate:  [75-111] 79 (12/02 1209) Resp:  [15-97] 21 (12/02 1209) BP: (121-144)/(63-87) 138/74 (12/02 1209) SpO2:  [94 %-97 %] 97 % (12/02 1209) Weight:  [84.8 kg (187 lb)] 84.8 kg (187 lb) (12/02 0321) Last BM Date: 05/17/16  Intake/Output from previous day: 12/01 0701 - 12/02 0700 In: 1873.5 [I.V.:251; ZO/XW:9604.5G/GT:1492.5; IV Piggyback:100] Out: 1333.5 [Urine:1275; Drains:58.5] Intake/Output this shift: Total I/O In: 124.7 [I.V.:11.8; NG/GT:112.8] Out: 575 [Urine:550; Drains:25]  PE: Gen:  Alert, NAD, pleasant, sitting up in chair, tired looking Card:  RRR, no M/G/R heard, 2+ radial pulses bilaterally Pulm:  Effort normal WUJ:WJXBAbd:soft, mildly distented, +BS, mild generalized TTP, drain with grey fluid  Lab Results:   Recent Labs  05/16/16 0248 05/17/16 1002  WBC 16.6* 14.2*  HGB 10.3* 9.8*  HCT 31.2* 29.9*  PLT 245 259   BMET  Recent Labs  05/15/16 0600 05/16/16 0248  NA 131* 134*  K 4.0 3.9  CL 100* 98*  CO2 26 30  GLUCOSE 158* 154*  BUN 18 17  CREATININE 0.63 0.76  CALCIUM 8.0* 8.1*   PT/INR No results for input(s): LABPROT, INR in the last 72 hours. CMP     Component Value Date/Time   NA 134 (L) 05/16/2016 0248   NA 136 (A) 05/01/2016   K 3.9 05/16/2016 0248   CL 98 (L) 05/16/2016 0248   CO2 30 05/16/2016 0248   GLUCOSE 154 (H) 05/16/2016 0248   BUN 17 05/16/2016 0248   BUN 23 (A) 05/01/2016   CREATININE 0.76 05/16/2016 0248   CALCIUM 8.1 (L) 05/16/2016 0248   PROT 5.1 (L) 05/16/2016 0248   ALBUMIN 1.4 (L) 05/16/2016 0248   AST 33 05/16/2016 0248   ALT 25 05/16/2016 0248   ALKPHOS 193 (H) 05/16/2016 0248   BILITOT 0.5 05/16/2016 0248   GFRNONAA >60 05/16/2016 0248   GFRAA >60 05/16/2016 0248    Lipase     Component Value Date/Time   LIPASE 18 05/10/2016 0229       Studies/Results: No results found.  Anti-infectives: Anti-infectives    Start     Dose/Rate Route Frequency Ordered Stop   05/12/16 0000  vancomycin (VANCOCIN) IVPB 1000 mg/200 mL premix  Status:  Discontinued     1,000 mg 200 mL/hr over 60 Minutes Intravenous Every 12 hours 05/11/16 1322 05/15/16 1503   05/11/16 1315  vancomycin (VANCOCIN) 500 mg in sodium chloride 0.9 % 100 mL IVPB     500 mg 100 mL/hr over 60 Minutes Intravenous STAT 05/11/16 1304 05/11/16 1558   05/10/16 1200  imipenem-cilastatin (PRIMAXIN) 500 mg in sodium chloride 0.9 % 100 mL IVPB     500 mg 200 mL/hr over 30 Minutes Intravenous Every 6 hours 05/10/16 0846     05/09/16 1200  cefTRIAXone (ROCEPHIN) 2 g in dextrose 5 % 50 mL IVPB  Status:  Discontinued     2 g 100 mL/hr over 30 Minutes Intravenous Every 24 hours 05/09/16 0801 05/09/16 1050   05/09/16 1200  cefTRIAXone (ROCEPHIN) 1 g in dextrose 5 % 50 mL IVPB  Status:  Discontinued     1 g 100 mL/hr over 30 Minutes Intravenous Every 24 hours 05/09/16 1050 05/10/16 0833   05/09/16 1100  vancomycin (VANCOCIN)  IVPB 750 mg/150 ml premix  Status:  Discontinued     750 mg 150 mL/hr over 60 Minutes Intravenous Every 12 hours 05/09/16 1051 05/11/16 1322   05/05/16 1230  fluconazole (DIFLUCAN) IVPB 100 mg  Status:  Discontinued     100 mg 50 mL/hr over 60 Minutes Intravenous Every 24 hours 05/05/16 1135 05/15/16 1503   05/03/16 1200  imipenem-cilastatin (PRIMAXIN) 500 mg in sodium chloride 0.9 % 100 mL IVPB  Status:  Discontinued     500 mg 200 mL/hr over 30 Minutes Intravenous Every 6 hours 05/03/16 1122 05/09/16 0801   05/03/16 0030  piperacillin-tazobactam (ZOSYN) IVPB 3.375 g  Status:  Discontinued     3.375 g 12.5 mL/hr over 240 Minutes Intravenous Every 8 hours 05/02/16 1647 05/03/16 1101   05/02/16 1930  fluconazole (DIFLUCAN) tablet 100 mg  Status:  Discontinued     100 mg Oral  Daily 05/02/16 1816 05/05/16 1135   05/02/16 1600  piperacillin-tazobactam (ZOSYN) IVPB 3.375 g     3.375 g 100 mL/hr over 30 Minutes Intravenous  Once 05/02/16 1548 05/02/16 1720       Assessment/Plan Gallstone pancreatitiswith infected pancreatic necrosis - NPO, Post pyloric tube feeds at goal rate of 70 ml/hr, tolerating well, d/c'd TPN  - Continue abx Primaxin 11/25>> - s/p pancreatic drainage per IR 11/28 with moderate amount of grey coarse drainage - WBC down today from yesterday, CBC tomorrow AM - Will continue for now with conservative mgt with drain, abx, tube feeds. Surgery via rp approach is still option moving forward as well.   Constipation:  - improving - continue bowel regiment to include colace, miralax and suppository   Pulm: per primary, - Suspect developing pna chest x-ray shows left basilar atelectasis versus pneumonia - Continue vancomycin/imipenem, d/c Vanc in 24 h - pulmonary toilet    LOS: 15 days    Berna Buehelsea A Connor , MD [facility] 05/17/2016, 1:08 PM

## 2016-05-17 NOTE — Progress Notes (Signed)
PROGRESS NOTE    Colin West  ZOX:[id number] DOB: [birth year] DOA: 05/02/2016 PCP: [name], MD   Brief Narrative: Colin West a 82 y.o.malewith complex history recently hospitalized from 10/8-10/12 for hemorrhagic pancreatitis from gallblader source, and on 11/7-11/13 for Severe Sepsis due to E.Coli pyelonephritis, discharged to [facility] on oral antibiotics, brought to the emergency department with worsening generalized weakness,11/17with associated nausea and vomiting. lost about 15 lbs in the last 2 months. CT scan shows multiple abscesses although not very well-developed, lots of air bubbles. Possible infected pancreatic necrosis. Patient found to have multiple peripancreatic abscesses, received drainage by IR x 2 on 11/18 and 11/28.   Assessment & Plan:   # Gallstone pancreatitis with Pancreatic abscess:  -the abscess aspiration growing E coli, continue primaxin.  -Surgery consult appreciated. Patient is currently nothing by mouth with post pyloric tube feed tolerating well. Continue antibiotics as per surgery team. Monitor CBC. May need repeat CT scan in few days.  # Porbable HCAP: Treated with vancomycin and imipenem. Off vancomycin now. Oxygen saturation is acceptable in room air.  #Severe protein calorie malnutrition: Currently on to feed. Not on TPN. Follow-up dietary's recommendation.  # Essential hypertension: Monitor blood pressure. Continue current anti-hypertensive medications.   #Paroxysmal atrial fibrillation: Currently on sinus rhythm. Continue beta blocker. Cardiologist evaluated the patient during this hospitalization. Follow-up further recommendation.  #Type 2 diabetes: Monitor blood sugar. Continue sliding scale.  #Hyperlipidemia: Continue statin.  #Possible acute on chronic systolic CHF in the setting of dilated cardiomyopathy and recent sepsis: Echo with EF of 30-35%. Monitor weight and fluid status. Stable now.  #Acute  encephalopathy, metabolic: Minimize narcotics. Continue status stable this morning.   DVT prophylaxis: SCD. Not on anticoagulation because of hemorrhagic pancreatitis Code Status: Full code Family Communication: No family present at bedside Disposition Plan: Likely discharge home versus SNF in with 2-3 days    Consultants:   General surgery, interventional radiology and cardiologist.  Procedures: Echo  Antimicrobials:Imipenem 11/18 >>   Vancomycin 11/24 >>11/30            Fluconazole 11/20 >> 11/30  Subjective: Patient was seen and examined at bedside. Patient reported chronic stable abdominal pain. Generalized weakness. Denied headache, dizziness, nausea, vomiting, chest shortness of breath.   Objective: Vitals:   05/17/16 0328 05/17/16 0811 05/17/16 1000 05/17/16 1209  BP: 125/70 126/67 135/73 138/74  Pulse: 88 87 84 79  Resp: (!) 97 18 18 (!) 21  Temp: 98.5 F (36.9 C) 98.6 F (37 C)  98.3 F (36.8 C)  TempSrc: Oral Oral  Oral  SpO2: 97% 96% 96% 97%  Weight:      Height:        Intake/Output Summary (Last 24 hours) at 05/17/16 1220 Last data filed at 05/17/16 1209  Gross per 24 hour  Intake          1958.16 ml  Output           1883.5 ml  Net            74.66 ml   Filed Weights   05/15/16 0300 05/16/16 0300 05/17/16 0321  Weight: 85.3 kg (188 lb) 83.5 kg (184 lb) 84.8 kg (187 lb)    Examination:  General exam: Ill-appearing male lying in bed, not in distress Respiratory system: Clear to auscultation. Respiratory effort normal. No wheezing or crackle Cardiovascular system: S1 & S2 heard, RRR.  No pedal edema. Gastrointestinal system: Soft, nondistended, bowel sounds positive. Central nervous system: Alert  oriented and following commands Extremities: Symmetric 5 x 5 power. Skin: No rashes, lesions or ulcers Psychiatry: Judgement and insight appear normal. Mood & affect appropriate.     Data Reviewed: I have personally reviewed following labs and  imaging studies  CBC:  Recent Labs Lab 05/12/16 0541 05/14/16 0229 05/15/16 0912 05/16/16 0248 05/17/16 1002  WBC 16.0* 18.4* 18.6* 16.6* 14.2*  NEUTROABS 10.9*  --   --   --   --   HGB 10.5* 10.7* 11.0* 10.3* 9.8*  HCT 32.9* 32.8* 33.2* 31.2* 29.9*  MCV 96.5 95.6 94.3 94.8 93.7  PLT 192 213 263 245 259   Basic Metabolic Panel:  Recent Labs Lab 05/12/16 0541 05/14/16 0229 05/15/16 0600 05/16/16 0248  NA 134* 131* 131* 134*  K 3.8 4.5 4.0 3.9  CL 103 99* 100* 98*  CO2 28 28 26 30   GLUCOSE 154* 153* 158* 154*  BUN 21* 16 18 17   CREATININE 0.86 0.75 0.63 0.76  CALCIUM 8.1* 8.1* 8.0* 8.1*  MG 1.9  --  1.9  --   PHOS 3.0  --  2.9  --    GFR: Estimated Creatinine Clearance: 73.6 mL/min (by C-G formula based on SCr of 0.76 mg/dL). Liver Function Tests:  Recent Labs Lab 05/12/16 0541 05/14/16 0229 05/15/16 0600 05/16/16 0248  AST 45* 35 25 33  ALT 39 32 23 25  ALKPHOS 299* 261* 209* 193*  BILITOT 0.7 0.9 0.6 0.5  PROT 5.4* 5.0* 5.1* 5.1*  ALBUMIN 1.5* 1.5* 1.4* 1.4*   No results for input(s): LIPASE, AMYLASE in the last 168 hours.  Recent Labs Lab 05/11/16 0223  AMMONIA 19   Coagulation Profile:  Recent Labs Lab 05/12/16 1415  INR 1.28   Cardiac Enzymes: No results for input(s): CKTOTAL, CKMB, CKMBINDEX, TROPONINI in the last 168 hours. BNP (last 3 results) No results for input(s): PROBNP in the last 8760 hours. HbA1C: No results for input(s): HGBA1C in the last 72 hours. CBG:  Recent Labs Lab 05/16/16 2012 05/17/16 0055 05/17/16 0333 05/17/16 0809 05/17/16 1207  GLUCAP 154* 142* 125* 155* 131*   Lipid Profile: No results for input(s): CHOL, HDL, LDLCALC, TRIG, CHOLHDL, LDLDIRECT in the last 72 hours. Thyroid Function Tests: No results for input(s): TSH, T4TOTAL, FREET4, T3FREE, THYROIDAB in the last 72 hours. Anemia Panel: No results for input(s): VITAMINB12, FOLATE, FERRITIN, TIBC, IRON, RETICCTPCT in the last 72 hours. Sepsis  Labs: No results for input(s): PROCALCITON, LATICACIDVEN in the last 168 hours.  Recent Results (from the past 240 hour(s))  Aerobic/Anaerobic Culture (surgical/deep wound)     Status: None (Preliminary result)   Collection Time: 05/13/16  1:57 PM  Result Value Ref Range Status   Specimen Description ABSCESS  Final   Special Requests PANCREATIC PSEUDOCYST  Final   Gram Stain   Final    ABUNDANT WBC PRESENT,BOTH PMN AND MONONUCLEAR NO ORGANISMS SEEN    Culture   Final    FEW ESCHERICHIA COLI NO ANAEROBES ISOLATED; CULTURE IN PROGRESS FOR 5 DAYS    Report Status PENDING  Incomplete   Organism ID, Bacteria ESCHERICHIA COLI  Final      Susceptibility   Escherichia coli - MIC*    AMPICILLIN >=32 RESISTANT Resistant     CEFAZOLIN 8 SENSITIVE Sensitive     CEFEPIME <=1 SENSITIVE Sensitive     CEFTAZIDIME <=1 SENSITIVE Sensitive     CEFTRIAXONE <=1 SENSITIVE Sensitive     CIPROFLOXACIN >=4 RESISTANT Resistant     GENTAMICIN <=1  SENSITIVE Sensitive     IMIPENEM 0.5 SENSITIVE Sensitive     TRIMETH/SULFA >=320 RESISTANT Resistant     AMPICILLIN/SULBACTAM >=32 RESISTANT Resistant     PIP/TAZO 8 SENSITIVE Sensitive     Extended ESBL NEGATIVE Sensitive     * FEW ESCHERICHIA COLI         Radiology Studies: No results found.      Scheduled Meds: . atorvastatin  10 mg Oral q1800  . bisacodyl  5 mg Rectal Once  . chlorhexidine  15 mL Mouth Rinse BID  . docusate  50 mg Per Tube Daily  . guaiFENesin-dextromethorphan  5 mL Oral Q8H  . imipenem-cilastatin  500 mg Intravenous Q6H  . insulin aspart  0-15 Units Subcutaneous Q4H  . mouth rinse  15 mL Mouth Rinse q12n4p  . metoprolol  100 mg Oral BID  . pantoprazole sodium  40 mg Per Tube BID  . polyethylene glycol  17 g Oral Daily  . sodium chloride flush  10-40 mL Intracatheter Q12H  . sodium chloride flush  3 mL Intravenous Q12H   Continuous Infusions: . sodium chloride 10 mL/hr at 05/17/16 0700  . feeding supplement (JEVITY  1.2 CAL) 1,000 mL (05/17/16 0700)     LOS: 15 days    Anique Beckley Jaynie CollinsPrasad Leen Tworek, MD [facility] Pager [phone]  If 7PM-7AM, please contact night-coverage [email or web address] Password [email or web address] 05/17/2016, 12:20 PM

## 2016-05-18 ENCOUNTER — Inpatient Hospital Stay (HOSPITAL_COMMUNITY): Payer: Medicare Other

## 2016-05-18 LAB — GLUCOSE, CAPILLARY
GLUCOSE-CAPILLARY: 172 mg/dL — AB (ref 65–99)
GLUCOSE-CAPILLARY: 195 mg/dL — AB (ref 65–99)
GLUCOSE-CAPILLARY: 151 mg/dL — AB (ref 65–99)
GLUCOSE-CAPILLARY: 162 mg/dL — AB (ref 65–99)
GLUCOSE-CAPILLARY: 128 mg/dL — AB (ref 65–99)

## 2016-05-18 LAB — AEROBIC/ANAEROBIC CULTURE W GRAM STAIN (SURGICAL/DEEP WOUND)

## 2016-05-18 LAB — AEROBIC/ANAEROBIC CULTURE (SURGICAL/DEEP WOUND)

## 2016-05-18 MED ORDER — ALBUTEROL SULFATE (2.5 MG/3ML) 0.083% IN NEBU
2.5000 mg | INHALATION_SOLUTION | RESPIRATORY_TRACT | Status: DC | PRN
  Administered 2016-05-18 – 2016-05-19 (×2): 2.5 mg via RESPIRATORY_TRACT
  Filled 2016-05-18 (×2): qty 3

## 2016-05-18 MED ORDER — FUROSEMIDE 10 MG/ML IJ SOLN
40.0000 mg | Freq: Once | INTRAMUSCULAR | Status: AC
  Administered 2016-05-18: 40 mg via INTRAVENOUS
  Filled 2016-05-18: qty 4

## 2016-05-18 NOTE — Progress Notes (Signed)
Subjective: Pt with NAE Tol TFs  Objective: Vital signs in last 24 hours: Temp:  [97.7 F (36.5 C)-98.9 F (37.2 C)] 97.7 F (36.5 C) (12/03 0800) Pulse Rate:  [77-87] 85 (12/03 0800) Resp:  [16-22] 20 (12/03 0800) BP: (125-138)/(66-82) 137/69 (12/03 0800) SpO2:  [96 %-99 %] 97 % (12/03 0800) Weight:  [87.1 kg (192 lb)] 87.1 kg (192 lb) (12/03 0400) Last BM Date: 05/17/16  Intake/Output from previous day: 12/02 0701 - 12/03 0700 In: 2570 [I.V.:230; NG/GT:1640; IV Piggyback:700] Out: 1815 [Urine:1750; Drains:65] Intake/Output this shift: Total I/O In: 156.7 [I.V.:15.8; NG/GT:140.8] Out: 1350 [Urine:1350]  General appearance: alert and cooperative GI: soft, non-tender; bowel sounds normal; no masses,  no organomegaly and JP with thick pancreatic debris output  Lab Results:   Recent Labs  05/16/16 0248 05/17/16 1002  WBC 16.6* 14.2*  HGB 10.3* 9.8*  HCT 31.2* 29.9*  PLT 245 259   BMET  Recent Labs  05/16/16 0248  NA 134*  K 3.9  CL 98*  CO2 30  GLUCOSE 154*  BUN 17  CREATININE 0.76  CALCIUM 8.1*   PT/INR No results for input(s): LABPROT, INR in the last 72 hours. ABG No results for input(s): PHART, HCO3 in the last 72 hours.  Invalid input(s): PCO2, PO2  Studies/Results: Dg Chest Port 1 View  Result Date: 05/18/2016 CLINICAL DATA:  Shortness of breath. EXAM: PORTABLE CHEST 1 VIEW COMPARISON:  May 15, 2016 FINDINGS: Feeding tube terminates below today's film. Stable right PICC line. No pneumothorax. Low lung volumes on the right with no focal infiltrate. Layering effusion and underlying opacity on the left were not seen previously. Stable cardiomediastinal silhouette. IMPRESSION: New layering effusion and underlying opacity in the left base. Electronically Signed   By: Gerome Samavid  Williams III M.D   On: 05/18/2016 07:32    Anti-infectives: Anti-infectives    Start     Dose/Rate Route Frequency Ordered Stop   05/12/16 0000  vancomycin (VANCOCIN)  IVPB 1000 mg/200 mL premix  Status:  Discontinued     1,000 mg 200 mL/hr over 60 Minutes Intravenous Every 12 hours 05/11/16 1322 05/15/16 1503   05/11/16 1315  vancomycin (VANCOCIN) 500 mg in sodium chloride 0.9 % 100 mL IVPB     500 mg 100 mL/hr over 60 Minutes Intravenous STAT 05/11/16 1304 05/11/16 1558   05/10/16 1200  imipenem-cilastatin (PRIMAXIN) 500 mg in sodium chloride 0.9 % 100 mL IVPB     500 mg 200 mL/hr over 30 Minutes Intravenous Every 6 hours 05/10/16 0846     05/09/16 1200  cefTRIAXone (ROCEPHIN) 2 g in dextrose 5 % 50 mL IVPB  Status:  Discontinued     2 g 100 mL/hr over 30 Minutes Intravenous Every 24 hours 05/09/16 0801 05/09/16 1050   05/09/16 1200  cefTRIAXone (ROCEPHIN) 1 g in dextrose 5 % 50 mL IVPB  Status:  Discontinued     1 g 100 mL/hr over 30 Minutes Intravenous Every 24 hours 05/09/16 1050 05/10/16 0833   05/09/16 1100  vancomycin (VANCOCIN) IVPB 750 mg/150 ml premix  Status:  Discontinued     750 mg 150 mL/hr over 60 Minutes Intravenous Every 12 hours 05/09/16 1051 05/11/16 1322   05/05/16 1230  fluconazole (DIFLUCAN) IVPB 100 mg  Status:  Discontinued     100 mg 50 mL/hr over 60 Minutes Intravenous Every 24 hours 05/05/16 1135 05/15/16 1503   05/03/16 1200  imipenem-cilastatin (PRIMAXIN) 500 mg in sodium chloride 0.9 % 100 mL IVPB  Status:  Discontinued     500 mg 200 mL/hr over 30 Minutes Intravenous Every 6 hours 05/03/16 1122 05/09/16 0801   05/03/16 0030  piperacillin-tazobactam (ZOSYN) IVPB 3.375 g  Status:  Discontinued     3.375 g 12.5 mL/hr over 240 Minutes Intravenous Every 8 hours 05/02/16 1647 05/03/16 1101   05/02/16 1930  fluconazole (DIFLUCAN) tablet 100 mg  Status:  Discontinued     100 mg Oral Daily 05/02/16 1816 05/05/16 1135   05/02/16 1600  piperacillin-tazobactam (ZOSYN) IVPB 3.375 g     3.375 g 100 mL/hr over 30 Minutes Intravenous  Once 05/02/16 1548 05/02/16 1720      Assessment/Plan:  Gallstone pancreatitiswith infected  pancreatic necrosis - NPO, Post pyloric tube feeds at goal rate of 70 ml/hr, tolerating well, d/c'd TPN  - Continue abx Primaxin 11/25>> - s/p pancreatic drainage per IR 11/28 with moderate amount of grey coarse drainage - WBC down today from yesterday - Will continue for now with conservative mgt with drain, abx, tube feeds. Surgery via rp approach is still option moving forward as well.   Constipation: - improving - continue bowel regiment to include colace, miralax and suppository   Pulm:per primary, - Suspect developing pna chest x-ray shows left basilar atelectasis versus pneumonia - Continue imipenem  - pulmonary toilet   LOS: 16 days    Marigene Ehlersamirez Jr., [facility] 05/18/2016

## 2016-05-18 NOTE — Progress Notes (Signed)
PROGRESS NOTE  Colin West ZOX:[id number] DOB: December 01, 1933 DOA: 05/02/2016 PCP: Verl BangsADIONTCHENKO, ALEXEI, MD   LOS: 16 days   Brief Narrative: Glorious PeachGraham N West a 82 y.o.malewith complex history recently hospitalized from 10/8-10/12 for hemorrhagic pancreatitis from gallblader source, and on 11/7-11/13 for Severe Sepsis due to E.Coli pyelonephritis, discharged to [facility] on oral antibiotics, brought to the emergency department with worsening generalized weakness, 11/17   with associated nausea and vomiting.   lost about 15 lbs in the last 2 months. CT scan shows multiple abscesses although not very well-developed, lots of air bubbles. Possible infected pancreatic necrosis. Patient found to have multiple peripancreatic abscesses, now scheduled for IR CT-guided drain placement, received drainage by IR x 2 on 11/18 and 11/28.  Assessment & Plan: Principal Problem:   Pancreatic abscess Active Problems:   Essential hypertension   Hyperbilirubinemia   Malnutrition of moderate degree   Leukocytosis   History of acute pancreatitis   Congestive dilated cardiomyopathy (HCC)   Tinea cruris   Sacral decubitus ulcer, stage II   Encounter for feeding tube placement   Protein-calorie malnutrition, severe   Multiple Pancreatic Abscesses / pancreatic necrosis/ileus with recent hemorrhagic pancreatitis precipitated by gallstones - Presented to the hospital with abdominal pain, nausea and loss of appetite 11/17. - CT scan showed multiple peripancreatic abscesses, was recently in the hospital from 10/8-10/12 for hemorrhagic pancreatitis from gallbladder source. - Pancreatic abscess aspiration now growing Escherichia coli, General surgery recommends to continue Primaxin - strict NPO with postpyloric feeding. - Cardiology reconsulted 11/22 - if patient requires laparoscopic surgery - moderate risk, if patient requires open surgery, it would be considered high risk, at this point cannot rule  out ischemia, though no further cardiac testing recommended. Continue beta blocker. Proceed with surgery if indicated - Repeat 2-D echo shows mild improvement in EF to 35-40%.  - discuss with surgery, for now recommending close inpatient monitoring for response following drain placement and whether patient will need further surgery for his abscess. Will need repeat CT abdomen next week  Cough /probable HCAP - Suspect developing pna chest x-ray shows left basilar atelectasis versus pneumonia - Counseled RN to maintain strict aspiration precautions and pulmonary toilet, leukocytosis improving - on Imipenem, Lasix x 1 today  Protein calorie malnutrition, severe  - Lost 20 pounds in the past 2 months, albumin continues to decline - Per general surgery recommendation try postpyloric enteral nutrition prior to TPN. - Currently on both TPN and enteric feeding held because of ileus, now resumed - CORTRAK, in place  Atrial fibrillation, paroxysmal - Transient atrial fibrillation with heart rate 130s, back to sinus rhythm with IV/by mouth metoprolol. Continue oral metoprolol - TSH  2.319 - Cardiology has intermittently followed the patient during this hospitalization and during previous hospitalizations, defer anticoagulation to them   Type II Diabetes  - CBGs controlled 120-140s  Hypertension stable - Controlled - Continue home anti-hypertensive medications   Hyperlipidemia - Continue home statins  Acute on chronic systolic CHF in the setting of dilated Cardiomyopathy in view of recent sepsis - 2 D echo recently shows EF 30-35 % 11/10, repeat echo unchanged - Cautiously monitor fluid status, monitor weights, appears to have gained 1lb. H is up 17L, CXR with fluid overload, give Lasix 40 mg iv x 1 today - weight ~160 on admission, now 192, diurese  Encephalopathy - Minimize narcotics, resolved   DVT prophylaxis: SCDs Code Status: Full code Family Communication: no family at  bedside Disposition Plan: not ready for  D/C, per surgery  Consultants:   General surgery   Interventional Radiology  Cardiology  Procedures:   2D echo 05/08/16 Impressions: - Mild LVH with LVEF approximately 35-40%, diffuse hypokinesis. Grade 1 diastolic dysfunction. Mitral annular calcification with mild mitral regurgitation. Mildly sclerotic aortic valve. Trivial tricuspid regurgitation.  Peripancreatic fluid collection aspiration 11/18 >> growing E coli (resistant with Ampicillin and bactrim)  CT-guided drainage of large region of pancreatic necrosis and infected pseudocyst formation. A 14 French 11/28 >> preliminary growing Gram negative rods   Antimicrobials:  Imipenem 11/18 >>   Vancomycin 11/24 >>11/30  Fluconazole 11/20 >> 11/30  Subjective: - complains of left flank pain where drain is in place  Objective: Vitals:   05/18/16 0357 05/18/16 0400 05/18/16 0700 05/18/16 0800  BP:  138/66  137/69  Pulse:  77 81 85  Resp:  16 18 20   Temp:  98.7 F (37.1 C)  97.7 F (36.5 C)  TempSrc:  Oral  Oral  SpO2: 96% 99% 97% 97%  Weight:  87.1 kg (192 lb)    Height:        Intake/Output Summary (Last 24 hours) at 05/18/16 1029 Last data filed at 05/18/16 0943  Gross per 24 hour  Intake             2602 ml  Output             2865 ml  Net             -263 ml   Filed Weights   05/16/16 0300 05/17/16 0321 05/18/16 0400  Weight: 83.5 kg (184 lb) 84.8 kg (187 lb) 87.1 kg (192 lb)    Examination: Constitutional: NAD, ill appearing, NG tube in place Vitals:   05/18/16 0357 05/18/16 0400 05/18/16 0700 05/18/16 0800  BP:  138/66  137/69  Pulse:  77 81 85  Resp:  16 18 20   Temp:  98.7 F (37.1 C)  97.7 F (36.5 C)  TempSrc:  Oral  Oral  SpO2: 96% 99% 97% 97%  Weight:  87.1 kg (192 lb)    Height:       Eyes: PERRL, lids and conjunctivae normal ENMT: Mucous membranes are moist. Respiratory: clear to auscultation bilaterally, no wheezing, no crackles. Normal  respiratory effort.  Cardiovascular: Regular rate and rhythm, no murmurs / rubs / gallops. 1+ LE edema. 2+ pedal pulses. Abdomen: no tenderness. Bowel sounds positive. Left flank dressing c/d/i, drain in place Neurologic: non focal   Data Reviewed: I have personally reviewed following labs and imaging studies  CBC:  Recent Labs Lab 05/12/16 0541 05/14/16 0229 05/15/16 0912 05/16/16 0248 05/17/16 1002  WBC 16.0* 18.4* 18.6* 16.6* 14.2*  NEUTROABS 10.9*  --   --   --   --   HGB 10.5* 10.7* 11.0* 10.3* 9.8*  HCT 32.9* 32.8* 33.2* 31.2* 29.9*  MCV 96.5 95.6 94.3 94.8 93.7  PLT 192 213 263 245 259   Basic Metabolic Panel:  Recent Labs Lab 05/12/16 0541 05/14/16 0229 05/15/16 0600 05/16/16 0248  NA 134* 131* 131* 134*  K 3.8 4.5 4.0 3.9  CL 103 99* 100* 98*  CO2 28 28 26 30   GLUCOSE 154* 153* 158* 154*  BUN 21* 16 18 17   CREATININE 0.86 0.75 0.63 0.76  CALCIUM 8.1* 8.1* 8.0* 8.1*  MG 1.9  --  1.9  --   PHOS 3.0  --  2.9  --    GFR: Estimated Creatinine Clearance: 80.5 mL/min (by C-G formula based  on SCr of 0.76 mg/dL). Liver Function Tests:  Recent Labs Lab 05/12/16 0541 05/14/16 0229 05/15/16 0600 05/16/16 0248  AST 45* 35 25 33  ALT 39 32 23 25  ALKPHOS 299* 261* 209* 193*  BILITOT 0.7 0.9 0.6 0.5  PROT 5.4* 5.0* 5.1* 5.1*  ALBUMIN 1.5* 1.5* 1.4* 1.4*   No results for input(s): LIPASE, AMYLASE in the last 168 hours. No results for input(s): AMMONIA in the last 168 hours. Coagulation Profile:  Recent Labs Lab 05/12/16 1415  INR 1.28   CBG:  Recent Labs Lab 05/17/16 1826 05/17/16 2030 05/17/16 2333 05/18/16 0438 05/18/16 0613  GLUCAP 165* 154* 129* 172* 195*   Urine analysis:    Component Value Date/Time   COLORURINE AMBER (A) 05/02/2016 1149   APPEARANCEUR CLOUDY (A) 05/02/2016 1149   LABSPEC 1.024 05/02/2016 1149   PHURINE 6.0 05/02/2016 1149   GLUCOSEU NEGATIVE 05/02/2016 1149   HGBUR NEGATIVE 05/02/2016 1149   BILIRUBINUR SMALL  (A) 05/02/2016 1149   KETONESUR NEGATIVE 05/02/2016 1149   PROTEINUR NEGATIVE 05/02/2016 1149   UROBILINOGEN 0.2 03/08/2010 1026   NITRITE NEGATIVE 05/02/2016 1149   LEUKOCYTESUR SMALL (A) 05/02/2016 1149   Sepsis Labs: Invalid input(s): PROCALCITONIN, LACTICIDVEN  Recent Results (from the past 240 hour(s))  Aerobic/Anaerobic Culture (surgical/deep wound)     Status: None (Preliminary result)   Collection Time: 05/13/16  1:57 PM  Result Value Ref Range Status   Specimen Description ABSCESS  Final   Special Requests PANCREATIC PSEUDOCYST  Final   Gram Stain   Final    ABUNDANT WBC PRESENT,BOTH PMN AND MONONUCLEAR NO ORGANISMS SEEN    Culture FEW ESCHERICHIA COLI NO ANAEROBES ISOLATED   Final   Report Status PENDING  Incomplete   Organism ID, Bacteria ESCHERICHIA COLI  Final      Susceptibility   Escherichia coli - MIC*    AMPICILLIN >=32 RESISTANT Resistant     CEFAZOLIN 8 SENSITIVE Sensitive     CEFEPIME <=1 SENSITIVE Sensitive     CEFTAZIDIME <=1 SENSITIVE Sensitive     CEFTRIAXONE <=1 SENSITIVE Sensitive     CIPROFLOXACIN >=4 RESISTANT Resistant     GENTAMICIN <=1 SENSITIVE Sensitive     IMIPENEM 0.5 SENSITIVE Sensitive     TRIMETH/SULFA >=320 RESISTANT Resistant     AMPICILLIN/SULBACTAM >=32 RESISTANT Resistant     PIP/TAZO 8 SENSITIVE Sensitive     Extended ESBL NEGATIVE Sensitive     * FEW ESCHERICHIA COLI      Radiology Studies: Dg Chest Port 1 View  Result Date: 05/18/2016 CLINICAL DATA:  Shortness of breath. EXAM: PORTABLE CHEST 1 VIEW COMPARISON:  May 15, 2016 FINDINGS: Feeding tube terminates below today's film. Stable right PICC line. No pneumothorax. Low lung volumes on the right with no focal infiltrate. Layering effusion and underlying opacity on the left were not seen previously. Stable cardiomediastinal silhouette. IMPRESSION: New layering effusion and underlying opacity in the left base. Electronically Signed   By: Gerome Sam III M.D   On:  05/18/2016 07:32   Scheduled Meds: . atorvastatin  10 mg Oral q1800  . bisacodyl  5 mg Rectal Once  . chlorhexidine  15 mL Mouth Rinse BID  . docusate  50 mg Per Tube Daily  . guaiFENesin-dextromethorphan  5 mL Oral Q8H  . imipenem-cilastatin  500 mg Intravenous Q6H  . insulin aspart  0-15 Units Subcutaneous Q4H  . mouth rinse  15 mL Mouth Rinse q12n4p  . metoprolol  100 mg Oral BID  .  pantoprazole sodium  40 mg Per Tube BID  . polyethylene glycol  17 g Oral Daily  . sodium chloride flush  10-40 mL Intracatheter Q12H  . sodium chloride flush  3 mL Intravenous Q12H   Continuous Infusions: . sodium chloride 10 mL/hr at 05/18/16 0148  . feeding supplement (JEVITY 1.2 CAL) 1,000 mL (05/18/16 0143)   Pamella Pert, MD, PhD [facility] Pager [phone]  If 7PM-7AM, please contact night-coverage [email or web address] Password [email or web address] 05/18/2016, 10:29 AM

## 2016-05-18 NOTE — Evaluation (Signed)
Occupational Therapy Evaluation Patient Details Name: Colin West MRN: [id number] DOB: [birth year] Today's Date: 05/18/2016    History of Present Illness Pt readmitted with pancreatic abcessess. Pt with 2 recent admissions for hemorrhagic gallstone pancreatitis and sepsis. PMH - HTN, DM   Clinical Impression   Pt admitted with above. He demonstrates the below listed deficits and will benefit from continued OT to maximize safety and independence with BADLs.  Pt presents to OT with generalized weakness, decreased activity tolerance, impaired balance.  He requires max A, overall for ADLs.  He fatigues quickly with activity with HR 123 and RR 35.  Recommend SNF.       Follow Up Recommendations  SNF    Equipment Recommendations  None recommended by OT    Recommendations for Other Services       Precautions / Restrictions Precautions Precautions: Fall Restrictions Weight Bearing Restrictions: No      Mobility Bed Mobility                  Transfers Overall transfer level: Needs assistance Equipment used: Rolling walker (2 wheeled) Transfers: Sit to/from UGI CorporationStand;Stand Pivot Transfers Sit to Stand: Min assist Stand pivot transfers: Min assist       General transfer comment: assist to move into standing     Balance Overall balance assessment: Needs assistance Sitting-balance support: Feet supported Sitting balance-Leahy Scale: Fair   Postural control: Posterior lean Standing balance support: Bilateral upper extremity supported Standing balance-Leahy Scale: Poor                              ADL Overall ADL's : Needs assistance/impaired Eating/Feeding: NPO   Grooming: Wash/dry hands;Wash/dry face;Oral care;Brushing hair;Set up;Sitting   Upper Body Bathing: Minimal assitance;Sitting   Lower Body Bathing: Maximal assistance;Sit to/from stand   Upper Body Dressing : Moderate assistance;Sitting   Lower Body Dressing: Maximal assistance;Sit  to/from stand Lower Body Dressing Details (indicate cue type and reason): Pt unable to access feet  Toilet Transfer: Minimal assistance;Stand-pivot;BSC;RW   Toileting- Clothing Manipulation and Hygiene: Moderate assistance;Sit to/from stand       Functional mobility during ADLs: Minimal assistance General ADL Comments: fatigues quickly      Vision     Perception     Praxis      Pertinent Vitals/Pain Pain Assessment: Faces Pain Score: 2  Pain Location: "sinus" Pain Descriptors / Indicators: Pressure Pain Intervention(s): Monitored during session     Hand Dominance     Extremity/Trunk Assessment Upper Extremity Assessment Upper Extremity Assessment: Generalized weakness   Lower Extremity Assessment Lower Extremity Assessment: Defer to PT evaluation   Cervical / Trunk Assessment Cervical / Trunk Assessment: Kyphotic   Communication Communication Communication: No difficulties   Cognition Arousal/Alertness: Awake/alert Behavior During Therapy: WFL for tasks assessed/performed;Flat affect Overall Cognitive Status: Within Functional Limits for tasks assessed                     General Comments       Exercises Exercises: General Upper Extremity     Shoulder Instructions      Home Living Family/patient expects to be discharged to:: Skilled nursing facility                                 Additional Comments: Pt was residing [facility]  Prior Functioning/Environment Level of Independence: Needs assistance  Gait / Transfers Assistance Needed: Assist to amb with walker at SNF. Prior to 1 month ago pt was independent without assistive device. ADL's / Homemaking Assistance Needed: assist for ADLs - pt unable to elaborate             OT Problem List: Decreased strength;Decreased activity tolerance;Impaired balance (sitting and/or standing);Decreased cognition;Decreased safety awareness;Decreased knowledge of use of DME or  AE;Cardiopulmonary status limiting activity   OT Treatment/Interventions: Self-care/ADL training;Therapeutic exercise;Energy conservation;DME and/or AE instruction;Therapeutic activities;Patient/family education;Balance training    OT Goals(Current goals can be found in the care plan section) Acute Rehab OT Goals Patient Stated Goal: get better OT Goal Formulation: With patient Time For Goal Achievement: 06/15/16 Potential to Achieve Goals: Good ADL Goals Pt Will Perform Grooming: with min assist;standing Pt Will Perform Upper Body Bathing: with set-up;sitting Pt Will Perform Lower Body Bathing: with mod assist;with adaptive equipment;sit to/from stand Pt Will Perform Upper Body Dressing: with set-up;with supervision;sitting Pt Will Perform Lower Body Dressing: with mod assist;with adaptive equipment;sit to/from stand Pt Will Transfer to Toilet: with min assist;ambulating;regular height toilet;bedside commode;grab bars Pt Will Perform Toileting - Clothing Manipulation and hygiene: with min assist;sit to/from stand Pt/caregiver will Perform Home Exercise Program: Increased strength;Right Upper extremity;Left upper extremity;With Supervision;With written HEP provided  OT Frequency: Min 2X/week   Barriers to D/C: Decreased caregiver support          Co-evaluation              End of Session Equipment Utilized During Treatment: Rolling walker;Gait belt Nurse Communication: Mobility status  Activity Tolerance: Patient limited by fatigue Patient left: in chair;with call bell/phone within reach;with chair alarm set   Time: [id number] OT Time Calculation (min): 20 min Charges:  OT General Charges $OT Visit: 1 Procedure OT Evaluation $OT Eval Moderate Complexity: 1 Procedure G-Codes:    Caleyah Jr M 05/18/2016, 10:36 AM

## 2016-05-19 LAB — GLUCOSE, CAPILLARY
GLUCOSE-CAPILLARY: 125 mg/dL — AB (ref 65–99)
GLUCOSE-CAPILLARY: 156 mg/dL — AB (ref 65–99)
GLUCOSE-CAPILLARY: 182 mg/dL — AB (ref 65–99)
GLUCOSE-CAPILLARY: 95 mg/dL (ref 65–99)
Glucose-Capillary: 119 mg/dL — ABNORMAL HIGH (ref 65–99)
Glucose-Capillary: 153 mg/dL — ABNORMAL HIGH (ref 65–99)

## 2016-05-19 LAB — BASIC METABOLIC PANEL
Anion gap: 6 (ref 5–15)
BUN: 14 mg/dL (ref 6–20)
CALCIUM: 7.8 mg/dL — AB (ref 8.9–10.3)
CO2: 28 mmol/L (ref 22–32)
CREATININE: 0.61 mg/dL (ref 0.61–1.24)
Chloride: 100 mmol/L — ABNORMAL LOW (ref 101–111)
GFR calc non Af Amer: >60 mL/min (ref >60)
Glucose, Bld: 123 mg/dL — ABNORMAL HIGH (ref 65–99)
Potassium: 3.5 mmol/L (ref 3.5–5.1)
SODIUM: 134 mmol/L — AB (ref 135–145)

## 2016-05-19 LAB — CBC
HCT: 30.3 % — ABNORMAL LOW (ref 39.0–52.0)
Hemoglobin: 10 g/dL — ABNORMAL LOW (ref 13.0–17.0)
MCH: 30.9 pg (ref 26.0–34.0)
MCHC: 33 g/dL (ref 30.0–36.0)
MCV: 93.5 fL (ref 78.0–100.0)
PLATELETS: 216 10*3/uL (ref 150–400)
RBC: 3.24 MIL/uL — AB (ref 4.22–5.81)
RDW: 14.5 % (ref 11.5–15.5)
WBC: 12.2 10*3/uL — AB (ref 4.0–10.5)

## 2016-05-19 MED ORDER — HYDROCOD POLST-CPM POLST ER 10-8 MG/5ML PO SUER
5.0000 mL | Freq: Two times a day (BID) | ORAL | Status: DC | PRN
  Administered 2016-05-19 – 2016-05-21 (×4): 5 mL via ORAL
  Filled 2016-05-19 (×4): qty 5

## 2016-05-19 MED ORDER — IOPAMIDOL (ISOVUE-300) INJECTION 61%
INTRAVENOUS | Status: AC
  Administered 2016-05-19: 30 mL
  Filled 2016-05-19: qty 100

## 2016-05-19 MED ORDER — FUROSEMIDE 10 MG/ML IJ SOLN
40.0000 mg | Freq: Once | INTRAMUSCULAR | Status: AC
  Administered 2016-05-19: 40 mg via INTRAVENOUS
  Filled 2016-05-19: qty 4

## 2016-05-19 MED ORDER — SODIUM BICARBONATE 650 MG PO TABS: 650.0000 mg | ORAL_TABLET | Freq: Once | ORAL | Status: DC

## 2016-05-19 MED ORDER — IOPAMIDOL (ISOVUE-300) INJECTION 61%
INTRAVENOUS | Status: AC
  Administered 2016-05-19: 30 mL
  Filled 2016-05-19: qty 30

## 2016-05-19 MED ORDER — IOPAMIDOL (ISOVUE-300) INJECTION 61%
15.0000 mL | INTRAVENOUS | Status: AC
Start: 2016-05-19 — End: 2016-05-19

## 2016-05-19 MED ORDER — PANCRELIPASE (LIP-PROT-AMYL) 12000-38000 UNITS PO CPEP: 24000.0000 [IU] | ORAL_CAPSULE | Freq: Once | ORAL | Status: DC

## 2016-05-19 NOTE — Progress Notes (Signed)
Patient scheduled to have contrast for CT scan. Upon flushing- cortrak tube found to be occluded. Attempted to clear with no success. Ordered medicine from pharmacy to help break up blockage. Will discuss with night shift to see if it can be fixed. Discussed with CT that we are waiting on the blockage to clear before we can administer contrast. Will call CT scan when able to administer contrast.   Noe GensStefanie A Jameica Couts, RN

## 2016-05-19 NOTE — Care Management Important Message (Signed)
Important Message  Patient Details  Name: Colin West MRN: [id number] Date of Birth: November 21, 1933   Medicare Important Message Given:  Yes    Emanual Lamountain Abena 05/19/2016, 11:27 AM

## 2016-05-19 NOTE — Progress Notes (Signed)
PROGRESS NOTE  GAL FELDHAUS ZDG:[id number] DOB: [birth year] DOA: 05/02/2016 PCP: [name], MD   LOS: 17 days   Brief Narrative: Colin West a 82 y.o.malewith complex history recently hospitalized from 10/8-10/12 for hemorrhagic pancreatitis from gallblader source, and on 11/7-11/13 for Severe Sepsis due to E.Coli pyelonephritis, discharged to [facility] on oral antibiotics, brought to the emergency department with worsening generalized weakness, 11/17   with associated nausea and vomiting.   lost about 15 lbs in the last 2 months. CT scan shows multiple abscesses although not very well-developed, lots of air bubbles. Possible infected pancreatic necrosis. Patient found to have multiple peripancreatic abscesses, now scheduled for IR CT-guided drain placement, received drainage by IR x 2 on 11/18 and 11/28.  Assessment & Plan: Principal Problem:   Pancreatic abscess Active Problems:   Essential hypertension   Hyperbilirubinemia   Malnutrition of moderate degree   Leukocytosis   History of acute pancreatitis   Congestive dilated cardiomyopathy (HCC)   Tinea cruris   Sacral decubitus ulcer, stage II   Encounter for feeding tube placement   Protein-calorie malnutrition, severe   Multiple Pancreatic Abscesses / pancreatic necrosis/ileus with recent hemorrhagic pancreatitis precipitated by gallstones - Presented to the hospital with abdominal pain, nausea and loss of appetite 11/17. - CT scan showed multiple peripancreatic abscesses, was recently in the hospital from 10/8-10/12 for hemorrhagic pancreatitis from gallbladder source. - Pancreatic abscess aspiration now growing Escherichia coli, General surgery recommends to continue Primaxin - strict NPO with postpyloric feeding. - discuss with surgery, for now recommending close inpatient monitoring for response following drain placement and whether patient will need further surgery for his abscess.  - repeat CT  abdomen / pelvis pending today   Acute on chronic systolic CHF in the setting of dilated Cardiomyopathy in view of recent sepsis - Cardiology reconsulted 11/22 - if patient requires laparoscopic surgery - moderate risk, if patient requires open surgery, it would be considered high risk, at this point cannot rule out ischemia, though no further cardiac testing recommended. Continue beta blocker. Proceed with surgery if indicated - Repeat 2-D echo shows mild improvement in EF to 35-40%.  - Cautiously monitor fluid status, monitor weights, appears to have gained 1lb. H is up 17L, CXR with fluid overload, give Lasix 40 mg iv x 1 today - weight ~160 on admission, now 192 >> 184, continue IV Lasix today   Cough /probable HCAP - Suspect developing pna chest x-ray shows left basilar atelectasis versus pneumonia - Counseled RN to maintain strict aspiration precautions and pulmonary toilet, leukocytosis improving - on Imipenem,  Protein calorie malnutrition, severe  - CORTRAK in place, continue tube feeds  Atrial fibrillation, paroxysmal - Transient atrial fibrillation with heart rate 130s, back to sinus rhythm with IV/by mouth metoprolol. Continue oral metoprolol - TSH  2.319 - Cardiology has intermittently followed the patient during this hospitalization and during previous hospitalizations, defer anticoagulation to them   Type II Diabetes  - CBGs controlled 120-140s  Hypertension stable - Controlled - Continue home anti-hypertensive medications   Hyperlipidemia - Continue home statins  Encephalopathy - Minimize narcotics, resolved   DVT prophylaxis: SCDs Code Status: Full code Family Communication: no family at bedside Disposition Plan: not ready for D/C, per surgery  Consultants:   General surgery   Interventional Radiology  Cardiology  Procedures:   2D echo 05/08/16 Impressions: - Mild LVH with LVEF approximately 35-40%, diffuse hypokinesis. Grade 1 diastolic  dysfunction. Mitral annular calcification with mild mitral regurgitation. Mildly  sclerotic aortic valve. Trivial tricuspid regurgitation.  Peripancreatic fluid collection aspiration 11/18 >> growing E coli (resistant with Ampicillin and bactrim)  CT-guided drainage of large region of pancreatic necrosis and infected pseudocyst formation. A 14 French 11/28 >> preliminary growing Gram negative rods   Antimicrobials:  Imipenem 11/18 >>   Vancomycin 11/24 >>11/30  Fluconazole 11/20 >> 11/30  Subjective: - complains of left flank pain where drain is in place  Objective: Vitals:   05/19/16 0320 05/19/16 0800 05/19/16 1000 05/19/16 1037  BP: (!) 148/73 (!) 146/82 138/78 138/78  Pulse: 85 88 89 94  Resp: 19 16 18    Temp: 98.7 F (37.1 C) 98.3 F (36.8 C)    TempSrc: Oral Oral    SpO2: 98% 97% 94%   Weight:      Height:        Intake/Output Summary (Last 24 hours) at 05/19/16 1124 Last data filed at 05/19/16 1049  Gross per 24 hour  Intake          2346.34 ml  Output             3665 ml  Net         -1318.66 ml   Filed Weights   05/17/16 0321 05/18/16 0400 05/19/16 0305  Weight: 84.8 kg (187 lb) 87.1 kg (192 lb) 83.5 kg (184 lb)    Examination: Constitutional: NAD, ill appearing, NG tube in place Vitals:   05/19/16 0320 05/19/16 0800 05/19/16 1000 05/19/16 1037  BP: (!) 148/73 (!) 146/82 138/78 138/78  Pulse: 85 88 89 94  Resp: 19 16 18    Temp: 98.7 F (37.1 C) 98.3 F (36.8 C)    TempSrc: Oral Oral    SpO2: 98% 97% 94%   Weight:      Height:       Eyes: PERRL, lids and conjunctivae normal ENMT: Mucous membranes are moist. Respiratory: clear to auscultation bilaterally, no wheezing, no crackles. Normal respiratory effort.  Cardiovascular: Regular rate and rhythm, no murmurs / rubs / gallops. 1+ LE edema. 2+ pedal pulses. Abdomen: no tenderness. Bowel sounds positive. Left flank dressing c/d/i, drain in place Neurologic: non focal   Data Reviewed: I have  personally reviewed following labs and imaging studies  CBC:  Recent Labs Lab 05/14/16 0229 05/15/16 0912 05/16/16 0248 05/17/16 1002 05/19/16 0300  WBC 18.4* 18.6* 16.6* 14.2* 12.2*  HGB 10.7* 11.0* 10.3* 9.8* 10.0*  HCT 32.8* 33.2* 31.2* 29.9* 30.3*  MCV 95.6 94.3 94.8 93.7 93.5  PLT 213 263 245 259 216   Basic Metabolic Panel:  Recent Labs Lab 05/14/16 0229 05/15/16 0600 05/16/16 0248 05/19/16 0300  NA 131* 131* 134* 134*  K 4.5 4.0 3.9 3.5  CL 99* 100* 98* 100*  CO2 28 26 30 28   GLUCOSE 153* 158* 154* 123*  BUN 16 18 17 14   CREATININE 0.75 0.63 0.76 0.61  CALCIUM 8.1* 8.0* 8.1* 7.8*  MG  --  1.9  --   --   PHOS  --  2.9  --   --    GFR: Estimated Creatinine Clearance: 73.6 mL/min (by C-G formula based on SCr of 0.61 mg/dL). Liver Function Tests:  Recent Labs Lab 05/14/16 0229 05/15/16 0600 05/16/16 0248  AST 35 25 33  ALT 32 23 25  ALKPHOS 261* 209* 193*  BILITOT 0.9 0.6 0.5  PROT 5.0* 5.1* 5.1*  ALBUMIN 1.5* 1.4* 1.4*   No results for input(s): LIPASE, AMYLASE in the last 168 hours. No results for  input(s): AMMONIA in the last 168 hours. Coagulation Profile:  Recent Labs Lab 05/12/16 1415  INR 1.28   CBG:  Recent Labs Lab 05/18/16 1701 05/18/16 2103 05/19/16 0044 05/19/16 0306 05/19/16 0819  GLUCAP 162* 128* 153* 125* 156*   Urine analysis:    Component Value Date/Time   COLORURINE AMBER (A) 05/02/2016 1149   APPEARANCEUR CLOUDY (A) 05/02/2016 1149   LABSPEC 1.024 05/02/2016 1149   PHURINE 6.0 05/02/2016 1149   GLUCOSEU NEGATIVE 05/02/2016 1149   HGBUR NEGATIVE 05/02/2016 1149   BILIRUBINUR SMALL (A) 05/02/2016 1149   KETONESUR NEGATIVE 05/02/2016 1149   PROTEINUR NEGATIVE 05/02/2016 1149   UROBILINOGEN 0.2 03/08/2010 1026   NITRITE NEGATIVE 05/02/2016 1149   LEUKOCYTESUR SMALL (A) 05/02/2016 1149   Sepsis Labs: Invalid input(s): PROCALCITONIN, LACTICIDVEN  Recent Results (from the past 240 hour(s))  Aerobic/Anaerobic  Culture (surgical/deep wound)     Status: None   Collection Time: 05/13/16  1:57 PM  Result Value Ref Range Status   Specimen Description ABSCESS  Final   Special Requests PANCREATIC PSEUDOCYST  Final   Gram Stain   Final    ABUNDANT WBC PRESENT,BOTH PMN AND MONONUCLEAR NO ORGANISMS SEEN    Culture FEW ESCHERICHIA COLI NO ANAEROBES ISOLATED   Final   Report Status 05/18/2016 FINAL  Final   Organism ID, Bacteria ESCHERICHIA COLI  Final      Susceptibility   Escherichia coli - MIC*    AMPICILLIN >=32 RESISTANT Resistant     CEFAZOLIN 8 SENSITIVE Sensitive     CEFEPIME <=1 SENSITIVE Sensitive     CEFTAZIDIME <=1 SENSITIVE Sensitive     CEFTRIAXONE <=1 SENSITIVE Sensitive     CIPROFLOXACIN >=4 RESISTANT Resistant     GENTAMICIN <=1 SENSITIVE Sensitive     IMIPENEM 0.5 SENSITIVE Sensitive     TRIMETH/SULFA >=320 RESISTANT Resistant     AMPICILLIN/SULBACTAM >=32 RESISTANT Resistant     PIP/TAZO 8 SENSITIVE Sensitive     Extended ESBL NEGATIVE Sensitive     * FEW ESCHERICHIA COLI      Radiology Studies: Dg Chest Port 1 View  Result Date: 05/18/2016 CLINICAL DATA:  Shortness of breath. EXAM: PORTABLE CHEST 1 VIEW COMPARISON:  May 15, 2016 FINDINGS: Feeding tube terminates below today's film. Stable right PICC line. No pneumothorax. Low lung volumes on the right with no focal infiltrate. Layering effusion and underlying opacity on the left were not seen previously. Stable cardiomediastinal silhouette. IMPRESSION: New layering effusion and underlying opacity in the left base. Electronically Signed   By: Gerome Sam III M.D   On: 05/18/2016 07:32   Scheduled Meds: . atorvastatin  10 mg Oral q1800  . bisacodyl  5 mg Rectal Once  . chlorhexidine  15 mL Mouth Rinse BID  . docusate  50 mg Per Tube Daily  . imipenem-cilastatin  500 mg Intravenous Q6H  . insulin aspart  0-15 Units Subcutaneous Q4H  . mouth rinse  15 mL Mouth Rinse q12n4p  . metoprolol  100 mg Oral BID  .  pantoprazole sodium  40 mg Per Tube BID  . polyethylene glycol  17 g Oral Daily  . sodium chloride flush  10-40 mL Intracatheter Q12H  . sodium chloride flush  3 mL Intravenous Q12H   Continuous Infusions: . sodium chloride 10 mL/hr at 05/18/16 0148  . feeding supplement (JEVITY 1.2 CAL) 1,000 mL (05/18/16 2021)   Pamella Pert, MD, PhD [facility] Pager [phone]  If 7PM-7AM, please contact night-coverage [email or web address] Password [email or web address]  05/19/2016, 11:24 AM

## 2016-05-19 NOTE — Progress Notes (Signed)
Pharmacy Antibiotic Note Colin West is a 82 y.o. male admitted on 05/02/2016 with pancreatic abscesses that is currently on day 10 of Primaxin (restart). Culture from pancreatic pseudocyst has grown E.coli.   Plan: 1. Continue Primaxin 500mg  IV Q6 2. Consider deescalating from Primaxin if appropriate as PNA (if of concern) should be adequately treated   Height: 5\' 9"  (175.3 cm) Weight: 184 lb (83.5 kg) IBW/kg (Calculated) : 70.7  Temp (24hrs), Avg:98.3 F (36.8 C), Min:97.7 F (36.5 C), Max:98.8 F (37.1 C)   Recent Labs Lab 05/14/16 0229 05/15/16 0600 05/15/16 0912 05/16/16 0248 05/17/16 1002 05/19/16 0300  WBC 18.4*  --  18.6* 16.6* 14.2* 12.2*  CREATININE 0.75 0.63  --  0.76  --  0.61    Estimated Creatinine Clearance: 73.6 mL/min (by C-G formula based on SCr of 0.61 mg/dL).    No Known Allergies  Antimicrobials this admission:  Zosyn 11/17 >> 11/18 Fluc 11/17>>11/20 Ceftriaxone 11/24 >>11/25 Vancomycin 11/24 >>11/30  Primaxin 11/18>> 11/24, 11/25 >>  Dose adjustments this admission:  11/26 VT = 13 on 750mg  q12h: (Increased to 1g IV Q12)  Microbiology results:  11/17 BCx: ngF 11/17 UCx: ngF  11/17 MRSA PCR: neg 11/18 Abscess from drain: Ecoli (S- imipenem, cefazolin, CTX) 11/28 Abscess Cx: E.coli R to amp, amp/sulb, cipro, bactrim and S to all other agents    Thank you for allowing pharmacy to be a part of this patient's care.   Pollyann SamplesAndy Trixy Loyola, PharmD, BCPS 05/19/2016, 7:53 AM Pager: [phone]

## 2016-05-19 NOTE — Plan of Care (Signed)
Problem: Pain Managment: Goal: General experience of comfort will improve Outcome: Progressing Patient did not request pain medication on my shift.

## 2016-05-19 NOTE — Progress Notes (Signed)
Patient ID: Colin West, male   DOB: 07/24/1933, 82 y.o.   MRN: [id number]    Referring Physician(s):  Dr. Emelia LoronMatthew Wakefield  Supervising Physician: Oley BalmHassell, Daniel  [facility]  Chief Complaint:  Pancreatic pseudocyst  Subjective:  Patient states he has no pain today but complains of generalized weakness.   Remains afebrile and WBC continues to decrease.  Allergies: Patient has no known allergies.  Medications: Prior to Admission medications   Medication Sig Start Date End Date Taking? Authorizing Provider  amoxicillin-clavulanate (AUGMENTIN) 875-125 MG tablet Take 1 tablet by mouth every 12 (twelve) hours. 04/28/16  Yes Marinda ElkAbraham Feliz Ortiz, MD  atorvastatin (LIPITOR) 10 MG tablet Take 10 mg by mouth daily.   Yes Historical Provider, MD  famotidine (PEPCID) 20 MG tablet Take 1 tablet (20 mg total) by mouth 2 (two) times daily. 04/07/16  Yes Osvaldo ShipperGokul Krishnan, MD  feeding supplement, GLUCERNA SHAKE, (GLUCERNA SHAKE) LIQD Take 237 mLs by mouth 3 (three) times daily between meals. 04/07/16  Yes Osvaldo ShipperGokul Krishnan, MD  lipase/protease/amylase (CREON) 36000 UNITS CPEP capsule Take 1 capsule (36,000 Units total) by mouth 3 (three) times daily before meals. 04/07/16  Yes Osvaldo ShipperGokul Krishnan, MD  lisinopril (PRINIVIL,ZESTRIL) 2.5 MG tablet Take 1 tablet (2.5 mg total) by mouth daily. 04/29/16  Yes Marinda ElkAbraham Feliz Ortiz, MD  metFORMIN (GLUCOPHAGE-XR) 500 MG 24 hr tablet Take 500 mg by mouth daily with breakfast.   Yes Historical Provider, MD  metoCLOPramide (REGLAN) 5 MG tablet Take 5-10 mg by mouth every 8 (eight) hours as needed (for hiccups).   Yes Historical Provider, MD  metoprolol (LOPRESSOR) 100 MG tablet Take 1 tablet (100 mg total) by mouth 2 (two) times daily. 04/28/16  Yes Marinda ElkAbraham Feliz Ortiz, MD  ondansetron (ZOFRAN) 4 MG tablet Take 4 mg by mouth every 8 (eight) hours as needed for nausea or vomiting.   Yes Historical Provider, MD  potassium chloride SA (K-DUR,KLOR-CON) 20  MEQ tablet Take 1 tablet (20 mEq total) by mouth daily. 04/07/16  Yes Osvaldo ShipperGokul Krishnan, MD  traMADol (ULTRAM) 50 MG tablet Take 1 tablet (50 mg total) by mouth every 6 (six) hours as needed for moderate pain. 04/28/16  Yes Marinda ElkAbraham Feliz Ortiz, MD    Vital Signs: BP 125/87 (BP Location: Left Arm)   Pulse 94   Temp 98.1 F (36.7 C) (Oral)   Resp 18   Ht 5\' 9"  (1.753 m)   Wt 184 lb (83.5 kg)   SpO2 94%   BMI 27.17 kg/m   Physical Exam Alert, communicative, NAD Abd soft, non-tender to light palpation with grey fluid output. 40 mL output recorded over past 24 hrs.  Dressing is saturated with serosanguinous drainage from insertion site.  Skin intact without erythema.   Imaging: Dg Chest Port 1 View  Result Date: 05/18/2016 CLINICAL DATA:  Shortness of breath. EXAM: PORTABLE CHEST 1 VIEW COMPARISON:  May 15, 2016 FINDINGS: Feeding tube terminates below today's film. Stable right PICC line. No pneumothorax. Low lung volumes on the right with no focal infiltrate. Layering effusion and underlying opacity on the left were not seen previously. Stable cardiomediastinal silhouette. IMPRESSION: New layering effusion and underlying opacity in the left base. Electronically Signed   By: Gerome Samavid  Williams III M.D   On: 05/18/2016 07:32    Labs:  CBC:  Recent Labs  05/15/16 0912 05/16/16 0248 05/17/16 1002 05/19/16 0300  WBC 18.6* 16.6* 14.2* 12.2*  HGB 11.0* 10.3* 9.8* 10.0*  HCT 33.2* 31.2* 29.9* 30.3*  PLT 263 245 259 216    COAGS:  Recent Labs  03/27/16 0500 04/23/16 0507 05/03/16 0242 05/12/16 1415  INR 1.23 1.39 1.43 1.28    BMP:  Recent Labs  05/14/16 0229 05/15/16 0600 05/16/16 0248 05/19/16 0300  NA 131* 131* 134* 134*  K 4.5 4.0 3.9 3.5  CL 99* 100* 98* 100*  CO2 28 26 30 28   GLUCOSE 153* 158* 154* 123*  BUN 16 18 17 14   CALCIUM 8.1* 8.0* 8.1* 7.8*  CREATININE 0.75 0.63 0.76 0.61  GFRNONAA >60 >60 >60 >60  GFRAA >60 >60 >60 >60    LIVER FUNCTION  TESTS:  Recent Labs  05/12/16 0541 05/14/16 0229 05/15/16 0600 05/16/16 0248  BILITOT 0.7 0.9 0.6 0.5  AST 45* 35 25 33  ALT 39 32 23 25  ALKPHOS 299* 261* 209* 193*  PROT 5.4* 5.0* 5.1* 5.1*  ALBUMIN 1.5* 1.5* 1.4* 1.4*    Assessment and Plan:  Pancreatic pseudocyst s/p drainage 11/28 with pigtail drain in place.  Cultures finalized-  Few E Coli Continue drain irrigation.  RN to change dressings.  Will monitor and recheck tomorrow.  Following with [facility] for any additional plans.  Follow-up CT once drain output minimal  Electronically Signed: Hoyt KochKacie Sue-Ellen Matthews 05/19/2016, 2:51 PM   I spent a total of 15 Minutes at the the patient's bedside AND on the patient's hospital floor or unit, greater than 50% of which was counseling/coordinating care for pancreatic pseudocyst.

## 2016-05-19 NOTE — Progress Notes (Signed)
[facility] Progress Note     Subjective: Pt states he is feeling much better. No abdominal pain, nausea, vomiting. Cough has improved. Has had numerous BM's. Tolerating TF's.  Objective: Vital signs in last 24 hours: Temp:  [97.7 F (36.5 C)-98.8 F (37.1 C)] 98.3 F (36.8 C) (12/04 0800) Pulse Rate:  [83-100] 88 (12/04 0800) Resp:  [15-22] 16 (12/04 0800) BP: (112-159)/(72-82) 146/82 (12/04 0800) SpO2:  [96 %-98 %] 97 % (12/04 0800) Weight:  [184 lb (83.5 kg)] 184 lb (83.5 kg) (12/04 0305) Last BM Date: 05/19/16  Intake/Output from previous day: 12/03 0701 - 12/04 0700 In: 2490 [I.V.:250; NG/GT:1840; IV Piggyback:400] Out: 2915 [Urine:2875; Drains:40] Intake/Output this shift: Total I/O In: -  Out: 475 [Urine:475]  PE: Gen:  Alert, NAD, pleasant, elderly man lying in bed. Card:  RRR, no M/G/R heard, 2+ radial pulses Pulm:  CTA, no W/R/R, normal effort Abd: Soft, mildly distended,+BS, no TTP, drain with grey course drainage, drain site with serosanguinous drainage on the dressing, no surrounding erythema or signs of infection.    Lab Results:   Recent Labs  05/17/16 1002 05/19/16 0300  WBC 14.2* 12.2*  HGB 9.8* 10.0*  HCT 29.9* 30.3*  PLT 259 216   BMET  Recent Labs  05/19/16 0300  NA 134*  K 3.5  CL 100*  CO2 28  GLUCOSE 123*  BUN 14  CREATININE 0.61  CALCIUM 7.8*   PT/INR No results for input(s): LABPROT, INR in the last 72 hours. CMP     Component Value Date/Time   NA 134 (L) 05/19/2016 0300   NA 136 (A) 05/01/2016   K 3.5 05/19/2016 0300   CL 100 (L) 05/19/2016 0300   CO2 28 05/19/2016 0300   GLUCOSE 123 (H) 05/19/2016 0300   BUN 14 05/19/2016 0300   BUN 23 (A) 05/01/2016   CREATININE 0.61 05/19/2016 0300   CALCIUM 7.8 (L) 05/19/2016 0300   PROT 5.1 (L) 05/16/2016 0248   ALBUMIN 1.4 (L) 05/16/2016 0248   AST 33 05/16/2016 0248   ALT 25 05/16/2016 0248   ALKPHOS 193 (H) 05/16/2016 0248   BILITOT 0.5 05/16/2016 0248   GFRNONAA >60 05/19/2016 0300   GFRAA >60 05/19/2016 0300   Lipase     Component Value Date/Time   LIPASE 18 05/10/2016 0229       Studies/Results: Dg Chest Port 1 View  Result Date: 05/18/2016 CLINICAL DATA:  Shortness of breath. EXAM: PORTABLE CHEST 1 VIEW COMPARISON:  May 15, 2016 FINDINGS: Feeding tube terminates below today's film. Stable right PICC line. No pneumothorax. Low lung volumes on the right with no focal infiltrate. Layering effusion and underlying opacity on the left were not seen previously. Stable cardiomediastinal silhouette. IMPRESSION: New layering effusion and underlying opacity in the left base. Electronically Signed   By: Gerome Samavid  Williams III M.D   On: 05/18/2016 07:32    Anti-infectives: Anti-infectives    Start     Dose/Rate Route Frequency Ordered Stop   05/12/16 0000  vancomycin (VANCOCIN) IVPB 1000 mg/200 mL premix  Status:  Discontinued     1,000 mg 200 mL/hr over 60 Minutes Intravenous Every 12 hours 05/11/16 1322 05/15/16 1503   05/11/16 1315  vancomycin (VANCOCIN) 500 mg in sodium chloride 0.9 % 100 mL IVPB     500 mg 100 mL/hr over 60 Minutes Intravenous STAT 05/11/16 1304 05/11/16 1558   05/10/16 1200  imipenem-cilastatin (PRIMAXIN) 500 mg in sodium chloride 0.9 % 100 mL IVPB  500 mg 200 mL/hr over 30 Minutes Intravenous Every 6 hours 05/10/16 0846     05/09/16 1200  cefTRIAXone (ROCEPHIN) 2 g in dextrose 5 % 50 mL IVPB  Status:  Discontinued     2 g 100 mL/hr over 30 Minutes Intravenous Every 24 hours 05/09/16 0801 05/09/16 1050   05/09/16 1200  cefTRIAXone (ROCEPHIN) 1 g in dextrose 5 % 50 mL IVPB  Status:  Discontinued     1 g 100 mL/hr over 30 Minutes Intravenous Every 24 hours 05/09/16 1050 05/10/16 0833   05/09/16 1100  vancomycin (VANCOCIN) IVPB 750 mg/150 ml premix  Status:  Discontinued     750 mg 150 mL/hr over 60 Minutes Intravenous Every 12 hours 05/09/16 1051 05/11/16 1322   05/05/16 1230  fluconazole (DIFLUCAN) IVPB 100 mg   Status:  Discontinued     100 mg 50 mL/hr over 60 Minutes Intravenous Every 24 hours 05/05/16 1135 05/15/16 1503   05/03/16 1200  imipenem-cilastatin (PRIMAXIN) 500 mg in sodium chloride 0.9 % 100 mL IVPB  Status:  Discontinued     500 mg 200 mL/hr over 30 Minutes Intravenous Every 6 hours 05/03/16 1122 05/09/16 0801   05/03/16 0030  piperacillin-tazobactam (ZOSYN) IVPB 3.375 g  Status:  Discontinued     3.375 g 12.5 mL/hr over 240 Minutes Intravenous Every 8 hours 05/02/16 1647 05/03/16 1101   05/02/16 1930  fluconazole (DIFLUCAN) tablet 100 mg  Status:  Discontinued     100 mg Oral Daily 05/02/16 1816 05/05/16 1135   05/02/16 1600  piperacillin-tazobactam (ZOSYN) IVPB 3.375 g     3.375 g 100 mL/hr over 30 Minutes Intravenous  Once 05/02/16 1548 05/02/16 1720       Assessment/Plan  Gallstone pancreatitiswith infected pancreatic necrosis - NPO, Post pyloric tube feeds at goal rate of 70 ml/hr, tolerating well, d/c'd TPN  - Continue abx Primaxin 11/25>> - s/p pancreatic drainage per IR 11/28 with moderate amount of grey coarse drainage - WBC continues to trend down - Will continue for now with conservative mgt with drain, abx, tube feeds. Surgery via rp approach is still option moving forward as well.   Constipation: - improving - continue bowel regiment    Pulm:per primary, cough has improved - Suspect developing pna chest x-ray shows left basilar atelectasis versus pneumonia - Continue imipenem  - pulmonary toilet    LOS: 17 days    Jerre SimonJessica L Tyquan Carmickle , [facility] 05/19/2016, 9:23 AM Pager: [phone] Consults: [phone] Mon-Fri 7:00 am-4:30 pm Sat-Sun 7:00 am-11:30 am

## 2016-05-20 ENCOUNTER — Inpatient Hospital Stay (HOSPITAL_COMMUNITY): Payer: Medicare Other

## 2016-05-20 LAB — BASIC METABOLIC PANEL
Anion gap: 6 (ref 5–15)
BUN: 11 mg/dL (ref 6–20)
CALCIUM: 7.8 mg/dL — AB (ref 8.9–10.3)
CO2: 26 mmol/L (ref 22–32)
CREATININE: 0.62 mg/dL (ref 0.61–1.24)
Chloride: 101 mmol/L (ref 101–111)
GFR calc non Af Amer: >60 mL/min (ref >60)
Glucose, Bld: 101 mg/dL — ABNORMAL HIGH (ref 65–99)
Potassium: 3.5 mmol/L (ref 3.5–5.1)
Sodium: 133 mmol/L — ABNORMAL LOW (ref 135–145)

## 2016-05-20 LAB — CBC
HEMATOCRIT: 30.8 % — AB (ref 39.0–52.0)
Hemoglobin: 10.1 g/dL — ABNORMAL LOW (ref 13.0–17.0)
MCH: 30.4 pg (ref 26.0–34.0)
MCHC: 32.8 g/dL (ref 30.0–36.0)
MCV: 92.8 fL (ref 78.0–100.0)
Platelets: 222 10*3/uL (ref 150–400)
RBC: 3.32 MIL/uL — ABNORMAL LOW (ref 4.22–5.81)
RDW: 14.5 % (ref 11.5–15.5)
WBC: 13.7 10*3/uL — ABNORMAL HIGH (ref 4.0–10.5)

## 2016-05-20 LAB — GLUCOSE, CAPILLARY
GLUCOSE-CAPILLARY: 150 mg/dL — AB (ref 65–99)
GLUCOSE-CAPILLARY: 153 mg/dL — AB (ref 65–99)
GLUCOSE-CAPILLARY: 168 mg/dL — AB (ref 65–99)
Glucose-Capillary: 138 mg/dL — ABNORMAL HIGH (ref 65–99)
Glucose-Capillary: 150 mg/dL — ABNORMAL HIGH (ref 65–99)
Glucose-Capillary: 173 mg/dL — ABNORMAL HIGH (ref 65–99)
Glucose-Capillary: 173 mg/dL — ABNORMAL HIGH (ref 65–99)

## 2016-05-20 MED ORDER — FUROSEMIDE 10 MG/ML IJ SOLN
40.0000 mg | Freq: Once | INTRAMUSCULAR | Status: AC
  Administered 2016-05-20: 40 mg via INTRAVENOUS
  Filled 2016-05-20: qty 4

## 2016-05-20 MED ORDER — IOPAMIDOL (ISOVUE-300) INJECTION 61%
INTRAVENOUS | Status: AC
  Administered 2016-05-20: 100 mL
  Filled 2016-05-20: qty 100

## 2016-05-20 MED ORDER — FUROSEMIDE 10 MG/ML IJ SOLN
40.0000 mg | Freq: Every day | INTRAMUSCULAR | Status: DC
  Administered 2016-05-21: 40 mg via INTRAVENOUS
  Filled 2016-05-20: qty 4

## 2016-05-20 MED ORDER — GUAIFENESIN-DM 100-10 MG/5ML PO SYRP
5.0000 mL | ORAL_SOLUTION | ORAL | Status: DC | PRN
  Administered 2016-05-20 – 2016-05-21 (×2): 5 mL via ORAL
  Filled 2016-05-20 (×2): qty 5

## 2016-05-20 NOTE — Progress Notes (Signed)
PROGRESS NOTE  Colin West ZOX:[id number] DOB: Dec 07, 1933 DOA: 05/02/2016 PCP: [name], MD   LOS: 18 days   Brief Narrative: Colin West a 82 y.o.malewith complex history recently hospitalized from 10/8-10/12 for hemorrhagic pancreatitis from gallblader source, and on 11/7-11/13 for Severe Sepsis due to E.Coli pyelonephritis, discharged to [facility] on oral antibiotics, brought to the emergency department with worsening generalized weakness, 11/17 with associated nausea and vomiting.   lost about 15 lbs in the last 2 months. CT scan shows multiple abscesses although not very well-developed, lots of air bubbles. Possible infected pancreatic necrosis. Patient found to have multiple peripancreatic abscesses, now scheduled for IR CT-guided drain placement, received drainage by IR x 2 on 11/18 and 11/28. Discussed with general surgery on 12/5, will need continued conservative management with IV antibiotics and repeat scans probably as early as next week. We'll transfer patient out of stepdown on 12/5, I've contacted the social worker for disposition placement, patient is interested in going to SNF, expect to be able to in couple of days  Assessment & Plan: Principal Problem:   Pancreatic abscess Active Problems:   Essential hypertension   Hyperbilirubinemia   Malnutrition of moderate degree   Leukocytosis   History of acute pancreatitis   Congestive dilated cardiomyopathy (HCC)   Tinea cruris   Sacral decubitus ulcer, stage II   Encounter for feeding tube placement   Protein-calorie malnutrition, severe   Multiple Pancreatic Abscesses / pancreatic necrosis/ileus with recent hemorrhagic pancreatitis precipitated by gallstones - CT scan showed multiple peripancreatic abscesses, was recently in the hospital from 10/8-10/12 for hemorrhagic pancreatitis from gallbladder source. - Pancreatic abscess aspiration now growing Escherichia coli, General surgery  recommends to continue Primaxin - strict NPO with postpyloric feeding. - repeat CT abdomen / pelvis today with persistent multifocal abscess and pancreatic necrosis, looking somewhat improved.  Acute on chronic systolic CHF in the setting of dilated Cardiomyopathy in view of recent sepsis - Cardiology reconsulted 11/22 - if patient requires laparoscopic surgery - moderate risk, if patient requires open surgery, it would be considered high risk, at this point cannot rule out ischemia, though no further cardiac testing recommended. Continue beta blocker. Proceed with surgery if indicated - Repeat 2-D echo shows mild improvement in EF to 35-40%.  - Cautiously monitor fluid status, monitor weights, appears to have gained 1lb. H is up 17L, CXR with fluid overload, give Lasix 40 mg iv x 1 today - weight ~160 on admission, now 192 >> 184 >> 179 continue IV Lasix today   Cough /probable HCAP - Suspect developing pna chest x-ray shows left basilar atelectasis versus pneumonia - Counseled RN to maintain strict aspiration precautions and pulmonary toilet, leukocytosis improving - on Imipenem as above  Protein calorie malnutrition, severe  - CORTRAK in place, continue tube feeds  Atrial fibrillation, paroxysmal - Transient atrial fibrillation with heart rate 130s, back to sinus rhythm with IV/by mouth metoprolol. Continue oral metoprolol - TSH  2.319 - Cardiology has intermittently followed the patient during this hospitalization and during previous hospitalizations, defer anticoagulation to them   Type II Diabetes  - CBGs controlled 120-140s  Hypertension stable - Controlled - Continue home anti-hypertensive medications   Hyperlipidemia - Continue home statins  Encephalopathy - Minimize narcotics, resolved   DVT prophylaxis: SCDs Code Status: Full code Family Communication: no family at bedside Disposition Plan: Transfer out of stepdown today, SNF likely 2-3 days once approaching his  dry weight  Consultants:   General surgery  Interventional Radiology  Cardiology  Procedures:   2D echo 05/08/16 Impressions: - Mild LVH with LVEF approximately 35-40%, diffuse hypokinesis. Grade 1 diastolic dysfunction. Mitral annular calcification with mild mitral regurgitation. Mildly sclerotic aortic valve. Trivial tricuspid regurgitation.  Peripancreatic fluid collection aspiration 11/18 >> growing E coli (resistant with Ampicillin and bactrim)  CT-guided drainage of large region of pancreatic necrosis and infected pseudocyst formation. A 14 [place] 11/28 >> preliminary growing Gram negative rods   Antimicrobials:  Imipenem 11/18 >>   Vancomycin 11/24 >>11/30  Fluconazole 11/20 >> 11/30  Subjective: - feeling improved, less dyspnea, asking for additional Lasix today as he feels like its helping his breathing   Objective: Vitals:   05/20/16 0800 05/20/16 0811 05/20/16 0918 05/20/16 1000  BP: 135/67   (!) 143/64  Pulse: 82  90 80  Resp: 20   10  Temp:  98.5 F (36.9 C)    TempSrc:  Oral    SpO2: 97%   97%  Weight:      Height:        Intake/Output Summary (Last 24 hours) at 05/20/16 1026 Last data filed at 05/20/16 1000  Gross per 24 hour  Intake             2406 ml  Output             3028 ml  Net             -622 ml   Filed Weights   05/18/16 0400 05/19/16 0305 05/20/16 0400  Weight: 87.1 kg (192 lb) 83.5 kg (184 lb) 81.2 kg (179 lb)    Examination: Constitutional: NAD, ill appearing, NG tube in place Vitals:   05/20/16 0800 05/20/16 0811 05/20/16 0918 05/20/16 1000  BP: 135/67   (!) 143/64  Pulse: 82  90 80  Resp: 20   10  Temp:  98.5 F (36.9 C)    TempSrc:  Oral    SpO2: 97%   97%  Weight:      Height:       Eyes: PERRL, lids and conjunctivae normal ENMT: Mucous membranes are moist. Respiratory: clear to auscultation bilaterally, no wheezing, no crackles. Normal respiratory effort.  Cardiovascular: Regular rate and rhythm, no murmurs /  rubs / gallops. 1+ LE edema. 2+ pedal pulses. Abdomen: no tenderness. Bowel sounds positive. Left flank dressing c/d/i, drain in place Neurologic: non focal   Data Reviewed: I have personally reviewed following labs and imaging studies  CBC:  Recent Labs Lab 05/15/16 0912 05/16/16 0248 05/17/16 1002 05/19/16 0300 05/20/16 0202  WBC 18.6* 16.6* 14.2* 12.2* 13.7*  HGB 11.0* 10.3* 9.8* 10.0* 10.1*  HCT 33.2* 31.2* 29.9* 30.3* 30.8*  MCV 94.3 94.8 93.7 93.5 92.8  PLT 263 245 259 216 222   Basic Metabolic Panel:  Recent Labs Lab 05/14/16 0229 05/15/16 0600 05/16/16 0248 05/19/16 0300 05/20/16 0202  NA 131* 131* 134* 134* 133*  K 4.5 4.0 3.9 3.5 3.5  CL 99* 100* 98* 100* 101  CO2 28 26 30 28 26   GLUCOSE 153* 158* 154* 123* 101*  BUN 16 18 17 14 11   CREATININE 0.75 0.63 0.76 0.61 0.62  CALCIUM 8.1* 8.0* 8.1* 7.8* 7.8*  MG  --  1.9  --   --   --   PHOS  --  2.9  --   --   --    GFR: Estimated Creatinine Clearance: 73.6 mL/min (by C-G formula based on SCr of 0.62 mg/dL). Liver Function Tests:  Recent Labs Lab 05/14/16 0229 05/15/16 0600 05/16/16 0248  AST 35 25 33  ALT 32 23 25  ALKPHOS 261* 209* 193*  BILITOT 0.9 0.6 0.5  PROT 5.0* 5.1* 5.1*  ALBUMIN 1.5* 1.4* 1.4*   No results for input(s): LIPASE, AMYLASE in the last 168 hours. No results for input(s): AMMONIA in the last 168 hours. Coagulation Profile: No results for input(s): INR, PROTIME in the last 168 hours. CBG:  Recent Labs Lab 05/19/16 1806 05/19/16 2107 05/20/16 0000 05/20/16 0434 05/20/16 0810  GLUCAP 95 119* 150* 138* 168*   Urine analysis:    Component Value Date/Time   COLORURINE AMBER (A) 05/02/2016 1149   APPEARANCEUR CLOUDY (A) 05/02/2016 1149   LABSPEC 1.024 05/02/2016 1149   PHURINE 6.0 05/02/2016 1149   GLUCOSEU NEGATIVE 05/02/2016 1149   HGBUR NEGATIVE 05/02/2016 1149   BILIRUBINUR SMALL (A) 05/02/2016 1149   KETONESUR NEGATIVE 05/02/2016 1149   PROTEINUR NEGATIVE  05/02/2016 1149   UROBILINOGEN 0.2 03/08/2010 1026   NITRITE NEGATIVE 05/02/2016 1149   LEUKOCYTESUR SMALL (A) 05/02/2016 1149   Sepsis Labs: Invalid input(s): PROCALCITONIN, LACTICIDVEN  Recent Results (from the past 240 hour(s))  Aerobic/Anaerobic Culture (surgical/deep wound)     Status: None   Collection Time: 05/13/16  1:57 PM  Result Value Ref Range Status   Specimen Description ABSCESS  Final   Special Requests PANCREATIC PSEUDOCYST  Final   Gram Stain   Final    ABUNDANT WBC PRESENT,BOTH PMN AND MONONUCLEAR NO ORGANISMS SEEN    Culture FEW ESCHERICHIA COLI NO ANAEROBES ISOLATED   Final   Report Status 05/18/2016 FINAL  Final   Organism ID, Bacteria ESCHERICHIA COLI  Final      Susceptibility   Escherichia coli - MIC*    AMPICILLIN >=32 RESISTANT Resistant     CEFAZOLIN 8 SENSITIVE Sensitive     CEFEPIME <=1 SENSITIVE Sensitive     CEFTAZIDIME <=1 SENSITIVE Sensitive     CEFTRIAXONE <=1 SENSITIVE Sensitive     CIPROFLOXACIN >=4 RESISTANT Resistant     GENTAMICIN <=1 SENSITIVE Sensitive     IMIPENEM 0.5 SENSITIVE Sensitive     TRIMETH/SULFA >=320 RESISTANT Resistant     AMPICILLIN/SULBACTAM >=32 RESISTANT Resistant     PIP/TAZO 8 SENSITIVE Sensitive     Extended ESBL NEGATIVE Sensitive     * FEW ESCHERICHIA COLI      Radiology Studies: Ct Abdomen Pelvis W Contrast  Result Date: 05/20/2016 CLINICAL DATA:  Pancreatitis, evaluate for healing of pancreatic abscess. EXAM: CT ABDOMEN AND PELVIS WITH CONTRAST TECHNIQUE: Multidetector CT imaging of the abdomen and pelvis was performed using the standard protocol following bolus administration of intravenous contrast. CONTRAST:  ISOVUE-300 IOPAMIDOL (ISOVUE-300) INJECTION 61% COMPARISON:  May 13, 2016, May 09, 2016 FINDINGS: Lower chest: There is a small right pleural effusion and small to moderate left pleural effusion. Mild dependent atelectasis of posterior lung bases are noted. The heart size is normal.  Hepatobiliary: There is diffuse fatty infiltration of liver. No focal liver lesion is identified. The gallbladder is normal. Pancreas: Pancreatic necrosis with multi focal abscesses are identified throughout the pancreas with a drainage catheter in place. The fluid volume within the multi focal abscesses are slightly smaller compared to prior exam. Spleen: Normal in size without focal abnormality. Adrenals/Urinary Tract: Stable left adrenal enlargement is unchanged. The right adrenal gland is normal. Right nephrolithiasis is noted. Small cysts are identified in both kidneys unchanged. There is no hydronephrosis. Stomach/Bowel: A feeding tube is identified  with distal tip in the proximal jejunum. There are mild thickened walled proximal small bowel loops likely due to inflammation from adjacent pancreatitis. There is no small bowel obstruction. There is diverticulosis of colon without diverticulitis. Vascular/Lymphatic: Aortic atherosclerosis. No enlarged abdominal or pelvic lymph nodes. Reproductive: Prostate calcifications are identified. Other: Ascites is identified in the abdomen pelvis not significantly changed. Musculoskeletal: Extensive degenerative joint changes of the spine are noted. IMPRESSION: Pancreatic necrosis with multi focal abscesses are identified throughout the pancreas with a drainage catheter in place. The fluid volume within the multi focal abscesses are slightly smaller compared to prior exam. Persistent ascites. Mild thickened walled proximal small bowel loops likely due to inflammation from adjacent pancreatitis. There is no evidence of small bowel obstruction. Electronically Signed   By: Sherian ReinWei-Chen  Lin M.D.   On: 05/20/2016 07:29   Scheduled Meds: . atorvastatin  10 mg Oral q1800  . bisacodyl  5 mg Rectal Once  . chlorhexidine  15 mL Mouth Rinse BID  . docusate  50 mg Per Tube Daily  . imipenem-cilastatin  500 mg Intravenous Q6H  . insulin aspart  0-15 Units Subcutaneous Q4H  .  lipase/protease/amylase  24,000 Units Oral Once   And  . sodium bicarbonate  650 mg Oral Once  . mouth rinse  15 mL Mouth Rinse q12n4p  . metoprolol  100 mg Oral BID  . pantoprazole sodium  40 mg Per Tube BID  . polyethylene glycol  17 g Oral Daily  . sodium chloride flush  10-40 mL Intracatheter Q12H  . sodium chloride flush  3 mL Intravenous Q12H   Continuous Infusions: . sodium chloride 10 mL/hr at 05/18/16 0148  . feeding supplement (JEVITY 1.2 CAL) 70 mL (05/19/16 2100)    Colin Pertostin Star Cheese, MD, PhD [facility] Pager [phone]  If 7PM-7AM, please contact night-coverage [email or web address] Password [email or web address] 05/20/2016, 10:26 AM

## 2016-05-20 NOTE — Plan of Care (Signed)
Problem: Activity: Goal: Risk for activity intolerance will decrease Outcome: Progressing Patient ambulated in hallway with PT today. He used a front wheel walker and was able to ambulate 400 feet, rest, ambulate another 400 feet and then sat up in chair for 2 hours. This is an improvement. Continue to encourage patient to sit up and take deep breaths using the IS.

## 2016-05-20 NOTE — Progress Notes (Signed)
Patient received the last bottle of the contrast and tolerated well. CT was called and notified that patient would be ready to go in 10 minutes.

## 2016-05-20 NOTE — Progress Notes (Signed)
Nutrition Follow-up  DOCUMENTATION CODES:   Severe malnutrition in context of acute illness/injury  INTERVENTION:    Continue Jevity 1.2 formula at 70 ml/hr to provide 2016 kcals, 93 gm protein, 1356 ml of free water  NUTRITION DIAGNOSIS:   Malnutrition related to acute illness as evidenced by severe depletion of body fat, severe depletion of muscle mass, energy intake < or equal to 50% for > or equal to 5 days, ongoing  GOAL:   Patient will meet greater than or equal to 90% of their needs, met  MONITOR:   TF tolerance, Labs, Weight trends, Skin, I & O's  ASSESSMENT:   82 year old male with a history of HTN, DM diet-controlled, hyperlipidemia. Patient was recently admitted with hemorrhagic pancreatitis secondary to gallbladder from 10/8-10/12. Patient was readmitted on 11/7-11/13 due to severe sepsis secondary to E coli and was discharged to [facility] on Augmentin. He's been continuing to become weak with nausea and vomiting and mild abdominal pain.  CORTRAK small bore feeding tube placed 11/18 (tip at duodenum). Pt with significant nausea and bilious emesis >> TPN started 11/20. Vital AF 1.2 formula initiated per RD at trickle rate of 10 ml/hr 11/21.  Vital AF 1.2 formula held 11/24 and restarted at 20 ml/hr 11/25. New formula of Jevity 1.2 formula initiated at 30 ml/hr 11/26. CT guided drainage of infected pancreatic pseudocyst 11/28. TPN discontinued 11/30.  Pt walking around halls with PT. Jevity 1.2 formula infusing at goal rate of 70 ml/hr. Tolerating his TF well; no abdominal pain. Perc drain remains in place. CBG's [phone].  Diet Order:  Diet NPO time specified  Skin:  MASD to groin, scrotum, and sacrum, dermatitis to groin and buttocks, excoriated sacrum, rash to groin/thigh  Last BM:  12/5  Height:  Ht Readings from Last 1 Encounters:  05/02/16 '5\' 9"'$  (1.753 m)   Weight:  Wt Readings from Last 1 Encounters:  05/20/16 179 lb (81.2 kg)   Ideal  Body Weight:  72.7 kg  BMI:  Body mass index is 26.43 kg/m.  Estimated Nutritional Needs:   Kcal:  1900-2100   Protein:  100-125 grams   Fluid:  > 1.9 L/day  EDUCATION NEEDS:   No education needs identified at this time  Arthur Holms, RD, LDN Pager #: [phone] After-Hours Pager #: [phone]

## 2016-05-20 NOTE — Progress Notes (Signed)
[facility] receiving patient to  [id number]. Patient belongings sent with him including clothing, glasses, Neurosurgeonelectric razor, phone charger, cell phone and prevalon boots- of note. CCMD notified of transfer as patient will be on telemetry. Family aware of transfer.  Noe GensStefanie A Vestal Crandall, RN

## 2016-05-20 NOTE — Progress Notes (Signed)
Physical Therapy Treatment Patient Details Name: Colin West MRN: [id number] DOB: 1934/03/23 Today's Date: 05/20/2016    History of Present Illness Colin West readmitted with pancreatic abcessess. Colin West with 2 recent admissions for hemorrhagic gallstone pancreatitis and sepsis. PMH - HTN, DM    Colin West Comments    Colin West continues to make steady progress and gain strength.  Follow Up Recommendations  SNF     Equipment Recommendations  None recommended by Colin West    Recommendations for Other Services       Precautions / Restrictions Precautions Precautions: Fall Restrictions Weight Bearing Restrictions: No    Mobility  Bed Mobility Overal bed mobility: Needs Assistance Bed Mobility: Supine to Sit     Supine to sit: Min assist;HOB elevated     General bed mobility comments: Assist to elevate trunk into sitting  Transfers Overall transfer level: Needs assistance Equipment used: Rolling walker (2 wheeled) Transfers: Sit to/from Stand Sit to Stand: Min assist;+2 safety/equipment         General transfer comment: Assist to bring hips up and for balance  Ambulation/Gait Ambulation/Gait assistance: Min assist Ambulation Distance (Feet): 225 Feet (125' x 1, 225' x 1) Assistive device: Rolling walker (2 wheeled) Gait Pattern/deviations: Step-through pattern;Trunk flexed     General Gait Details: Assist for balance and support. Verbal cues to stand more erect and look up.   Stairs            Wheelchair Mobility    Modified Rankin (Stroke Patients Only)       Balance Overall balance assessment: Needs assistance Sitting-balance support: No upper extremity supported;Feet supported Sitting balance-Leahy Scale: Fair     Standing balance support: Bilateral upper extremity supported Standing balance-Leahy Scale: Poor Standing balance comment: walker and min guard for static standing                    Cognition Arousal/Alertness: Awake/alert Behavior During  Therapy: WFL for tasks assessed/performed;Flat affect Overall Cognitive Status: Within Functional Limits for tasks assessed                      Exercises      General Comments        Pertinent Vitals/Pain Pain Assessment: No/denies pain    Home Living                      Prior Function            Colin West Goals (current goals can now be found in the care plan section) Progress towards Colin West goals: Progressing toward goals    Frequency    Min 2X/week      Colin West Plan Current plan remains appropriate    Co-evaluation             End of Session Equipment Utilized During Treatment: Gait belt Activity Tolerance: Patient tolerated treatment well Patient left: in chair;with call bell/phone within reach;with chair alarm set     Time: [id number] Colin West Time Calculation (min) (ACUTE ONLY): 24 min  Charges:  $Gait Training: 23-37 mins                    G CodesAngelina West:      Colin West 05/20/2016, 12:58 PM Fluor CorporationCary Maneh West Colin West [phone]

## 2016-05-20 NOTE — Progress Notes (Signed)
Patient arrived from 4E, report receieved over the phone. Patient had Cotrek tube to R Nare with Jevity running. PICC line to R upper arm [facility], and IR drain to [facility]. Patient's family at bedside. Oriented to room, only complaint at moment is a bad headache.

## 2016-05-20 NOTE — Progress Notes (Signed)
Patient ID: Colin West, male   DOB: Mar 13, 1934, 82 y.o.   MRN: [id number]    Referring Physician(s): Dr. Matthew Wakefield  Supervising Physician: Hassell, Daniel  Patient Status: [facility] - In-pt  Chief Complaint: Infected pseudocyst   Subjective: Patient doing well and states he has no abdominal pain.  Tolerating TFs through his PANDA tube.  Allergies: Patient has no known allergies.  Medications: Prior to Admission medications   Medication Sig Start Date End Date Taking? Authorizing Provider  amoxicillin-clavulanate (AUGMENTIN) 875-125 MG tablet Take 1 tablet by mouth every 12 (twelve) hours. 04/28/16  Yes Abraham Feliz Ortiz, MD  atorvastatin (LIPITOR) 10 MG tablet Take 10 mg by mouth daily.   Yes Historical Provider, MD  famotidine (PEPCID) 20 MG tablet Take 1 tablet (20 mg total) by mouth 2 (two) times daily. 04/07/16  Yes Gokul Krishnan, MD  feeding supplement, GLUCERNA SHAKE, (GLUCERNA SHAKE) LIQD Take 237 mLs by mouth 3 (three) times daily between meals. 04/07/16  Yes Gokul Krishnan, MD  lipase/protease/amylase (CREON) 36000 UNITS CPEP capsule Take 1 capsule (36,000 Units total) by mouth 3 (three) times daily before meals. 04/07/16  Yes Gokul Krishnan, MD  lisinopril (PRINIVIL,ZESTRIL) 2.5 MG tablet Take 1 tablet (2.5 mg total) by mouth daily. 04/29/16  Yes Abraham Feliz Ortiz, MD  metFORMIN (GLUCOPHAGE-XR) 500 MG 24 hr tablet Take 500 mg by mouth daily with breakfast.   Yes Historical Provider, MD  metoCLOPramide (REGLAN) 5 MG tablet Take 5-10 mg by mouth every 8 (eight) hours as needed (for hiccups).   Yes Historical Provider, MD  metoprolol (LOPRESSOR) 100 MG tablet Take 1 tablet (100 mg total) by mouth 2 (two) times daily. 04/28/16  Yes Abraham Feliz Ortiz, MD  ondansetron (ZOFRAN) 4 MG tablet Take 4 mg by mouth every 8 (eight) hours as needed for nausea or vomiting.   Yes Historical Provider, MD  potassium chloride SA (K-DUR,KLOR-CON) 20 MEQ tablet Take 1 tablet (20 mEq  total) by mouth daily. 04/07/16  Yes Gokul Krishnan, MD  traMADol (ULTRAM) 50 MG tablet Take 1 tablet (50 mg total) by mouth every 6 (six) hours as needed for moderate pain. 04/28/16  Yes Abraham Feliz Ortiz, MD    Vital Signs: BP (!) 143/64   Pulse 80   Temp 98.5 F (36.9 C) (Oral)   Resp 10   Ht 5\' 9"  (1.753 m)   Wt 179 lb (81.2 kg)   SpO2 97%   BMI 26.43 kg/m   Physical Exam: Abd: soft, NT, LUQ drain with chocolate milk, pancreatic type output. 70cc of output yesterday.    Imaging: Ct Abdomen Pelvis W Contrast  Result Date: 05/20/2016 CLINICAL DATA:  Pancreatitis, evaluate for healing of pancreatic abscess. EXAM: CT ABDOMEN AND PELVIS WITH CONTRAST TECHNIQUE: Multidetector CT imaging of the abdomen and pelvis was performed using the standard protocol following bolus administration of intravenous contrast. CONTRAST:  <MEASUREMENT[facility] HoHolly H56[place] -[facility] Danb44[place] -[facility] HoLake Nacimie21[place] -[facility] HoAsbury L57[place] -[facility] FarmingtonWathK[facility]uck 6[place] HoPentwa61[place] -[facility] DLen53[place] aRidgeline SurgicentSwansb91[place] -[place]ib[place] HoBala67[place] -[facility] [place](30[place] )[facility]toMoorefi26[facility] -[facility] Mornings57[place] -[facility] CenCarroll31[place] -[facility] L(351[place] [facility] HoSen40[place] -[facility](20[place] )[facility](81[place] )[facility] HoEast Honol60[place]7-526Jon Gills IOPAMIDOL (ISOVUE-300) INJECTION 61% COMPARISON:  May 13, 2016, May 09, 2016 FINDINGS: Lower chest: There is a small right pleural effusion and small to moderate left pleural effusion. Mild dependent atelectasis of posterior lung bases are noted. The heart size is normal. Hepatobiliary: There is diffuse fatty infiltration of liver. No focal liver lesion is identified. The gallbladder is normal. Pancreas: Pancreatic necrosis with multi focal abscesses are identified throughout the pancreas with a drainage catheter in place. The fluid volume within the multi focal abscesses are slightly smaller compared to prior exam. Spleen: Normal in size without focal abnormality. Adrenals/Urinary Tract: Stable  left adrenal enlargement is unchanged. The right adrenal gland is normal. Right nephrolithiasis is noted. Small cysts are identified in both kidneys unchanged. There is no hydronephrosis. Stomach/Bowel: A feeding tube is identified with distal tip in the proximal jejunum. There are mild thickened walled proximal small bowel loops likely due to inflammation from  adjacent pancreatitis. There is no small bowel obstruction. There is diverticulosis of colon without diverticulitis. Vascular/Lymphatic: Aortic atherosclerosis. No enlarged abdominal or pelvic lymph nodes. Reproductive: Prostate calcifications are identified. Other: Ascites is identified in the abdomen pelvis not significantly changed. Musculoskeletal: Extensive degenerative joint changes of the spine are noted. IMPRESSION: Pancreatic necrosis with multi focal abscesses are identified throughout the pancreas with a drainage catheter in place. The fluid volume within the multi focal abscesses are slightly smaller compared to prior exam. Persistent ascites. Mild thickened walled proximal small bowel loops likely due to inflammation from adjacent pancreatitis. There is no evidence of small bowel obstruction. Electronically Signed   By: Sherian ReinWei-Chen  Lin M.D.   On: 05/20/2016 07:29   Dg Chest Port 1 View  Result Date: 05/18/2016 CLINICAL DATA:  Shortness of breath. EXAM: PORTABLE CHEST 1 VIEW COMPARISON:  May 15, 2016 FINDINGS: Feeding tube terminates below today's film. Stable right PICC line. No pneumothorax. Low lung volumes on the right with no focal infiltrate. Layering effusion and underlying opacity on the left were not seen previously. Stable cardiomediastinal silhouette. IMPRESSION: New layering effusion and underlying opacity in the left base. Electronically Signed   By: Gerome Samavid  Williams III M.D   On: 05/18/2016 07:32    Labs:  CBC:  Recent Labs  05/16/16 0248 05/17/16 1002 05/19/16 0300 05/20/16 0202  WBC 16.6* 14.2* 12.2* 13.7*  HGB 10.3* 9.8* 10.0* 10.1*  HCT 31.2* 29.9* 30.3* 30.8*  PLT 245 259 216 222    COAGS:  Recent Labs  03/27/16 0500 04/23/16 0507 05/03/16 0242 05/12/16 1415  INR 1.23 1.39 1.43 1.28    BMP:  Recent Labs  05/15/16 0600 05/16/16 0248 05/19/16 0300 05/20/16 0202  NA 131* 134* 134* 133*  K 4.0 3.9 3.5 3.5  CL 100* 98* 100* 101  CO2 26 30 28 26    GLUCOSE 158* 154* 123* 101*  BUN 18 17 14 11   CALCIUM 8.0* 8.1* 7.8* 7.8*  CREATININE 0.63 0.76 0.61 0.62  GFRNONAA >60 >60 >60 >60  GFRAA >60 >60 >60 >60    LIVER FUNCTION TESTS:  Recent Labs  05/12/16 0541 05/14/16 0229 05/15/16 0600 05/16/16 0248  BILITOT 0.7 0.9 0.6 0.5  AST 45* 35 25 33  ALT 39 32 23 25  ALKPHOS 299* 261* 209* 193*  PROT 5.4* 5.0* 5.1* 5.1*  ALBUMIN 1.5* 1.5* 1.4* 1.4*    Assessment and Plan: 1. Infected pancreatic pseudocyst, s/p perc drain on 11/28 -CX reveal E. Coli.  abx per primary service -drain still with a good amount of output.  Cont drain placement for nwo -cont with routine drain care and irrigations -will need repeat CT scan once drain output less than 10-15cc/24hrs  Electronically Signed: Zerah Hilyer E 05/20/2016, 11:02 AM   I spent a total of 15 Minutes at the the patient's bedside AND on the patient's hospital floor or unit, greater than 50% of which was counseling/coordinating care for infected pancreatic pseudocyst

## 2016-05-20 NOTE — Progress Notes (Signed)
[facility] Progress Note     Subjective: Pt with no new complaints. States he is feeling better. No abdominal pain. Still having BM's, no nausea or vomiting. I discussed results of the CT scan with him and his wife on the phone. Pt is asking how long he will be here. I told him I did not know how long.  Objective: Vital signs in last 24 hours: Temp:  [98.1 F (36.7 C)-98.8 F (37.1 C)] 98.3 F (36.8 C) (12/05 0400) Pulse Rate:  [74-94] 81 (12/05 0400) Resp:  [14-21] 14 (12/05 0400) BP: (114-149)/(64-87) 114/64 (12/05 0400) SpO2:  [94 %-97 %] 96 % (12/05 0400) Weight:  [179 lb (81.2 kg)] 179 lb (81.2 kg) (12/05 0400) Last BM Date: 05/19/16  Intake/Output from previous day: 12/04 0701 - 12/05 0700 In: 2102.3 [I.V.:246.7; NG/GT:1535.7; IV Piggyback:300] Out: 3546 [Urine:3475; Drains:70; Stool:1] Intake/Output this shift: No intake/output data recorded.  PE: Gen:  Alert, NAD, pleasant, elderly man lying in bed. Card:  RRR, no M/G/R heard Pulm:  CTA, no W/R/R, normal effort Abd: Soft, mildly distended,+BS, no TTP, drain with grey course drainage  Lab Results:   Recent Labs  05/19/16 0300 05/20/16 0202  WBC 12.2* 13.7*  HGB 10.0* 10.1*  HCT 30.3* 30.8*  PLT 216 222   BMET  Recent Labs  05/19/16 0300 05/20/16 0202  NA 134* 133*  K 3.5 3.5  CL 100* 101  CO2 28 26  GLUCOSE 123* 101*  BUN 14 11  CREATININE 0.61 0.62  CALCIUM 7.8* 7.8*   PT/INR No results for input(s): LABPROT, INR in the last 72 hours. CMP     Component Value Date/Time   NA 133 (L) 05/20/2016 0202   NA 136 (A) 05/01/2016   K 3.5 05/20/2016 0202   CL 101 05/20/2016 0202   CO2 26 05/20/2016 0202   GLUCOSE 101 (H) 05/20/2016 0202   BUN 11 05/20/2016 0202   BUN 23 (A) 05/01/2016   CREATININE 0.62 05/20/2016 0202   CALCIUM 7.8 (L) 05/20/2016 0202   PROT 5.1 (L) 05/16/2016 0248   ALBUMIN 1.4 (L) 05/16/2016 0248   AST 33 05/16/2016 0248   ALT 25 05/16/2016 0248   ALKPHOS 193  (H) 05/16/2016 0248   BILITOT 0.5 05/16/2016 0248   GFRNONAA >60 05/20/2016 0202   GFRAA >60 05/20/2016 0202   Lipase     Component Value Date/Time   LIPASE 18 05/10/2016 0229       Studies/Results: Ct Abdomen Pelvis W Contrast  Result Date: 05/20/2016 CLINICAL DATA:  Pancreatitis, evaluate for healing of pancreatic abscess. EXAM: CT ABDOMEN AND PELVIS WITH CONTRAST TECHNIQUE: Multidetector CT imaging of the abdomen and pelvis was performed using the standard protocol following bolus administration of intravenous contrast. CONTRAST:  100mL ISOVUE-300 IOPAMIDOL (ISOVUE-300) INJECTION 61% COMPARISON:  May 13, 2016, May 09, 2016 FINDINGS: Lower chest: There is a small right pleural effusion and small to moderate left pleural effusion. Mild dependent atelectasis of posterior lung bases are noted. The heart size is normal. Hepatobiliary: There is diffuse fatty infiltration of liver. No focal liver lesion is identified. The gallbladder is normal. Pancreas: Pancreatic necrosis with multi focal abscesses are identified throughout the pancreas with a drainage catheter in place. The fluid volume within the multi focal abscesses are slightly smaller compared to prior exam. Spleen: Normal in size without focal abnormality. Adrenals/Urinary Tract: Stable left adrenal enlargement is unchanged. The right adrenal gland is normal. Right nephrolithiasis is noted. Small cysts are identified in both  kidneys unchanged. There is no hydronephrosis. Stomach/Bowel: A feeding tube is identified with distal tip in the proximal jejunum. There are mild thickened walled proximal small bowel loops likely due to inflammation from adjacent pancreatitis. There is no small bowel obstruction. There is diverticulosis of colon without diverticulitis. Vascular/Lymphatic: Aortic atherosclerosis. No enlarged abdominal or pelvic lymph nodes. Reproductive: Prostate calcifications are identified. Other: Ascites is identified in the  abdomen pelvis not significantly changed. Musculoskeletal: Extensive degenerative joint changes of the spine are noted. IMPRESSION: Pancreatic necrosis with multi focal abscesses are identified throughout the pancreas with a drainage catheter in place. The fluid volume within the multi focal abscesses are slightly smaller compared to prior exam. Persistent ascites. Mild thickened walled proximal small bowel loops likely due to inflammation from adjacent pancreatitis. There is no evidence of small bowel obstruction. Electronically Signed   By: Sherian Rein M.D.   On: 05/20/2016 07:29    Anti-infectives: Anti-infectives    Start     Dose/Rate Route Frequency Ordered Stop   05/12/16 0000  vancomycin (VANCOCIN) IVPB 1000 mg/200 mL premix  Status:  Discontinued     1,000 mg 200 mL/hr over 60 Minutes Intravenous Every 12 hours 05/11/16 1322 05/15/16 1503   05/11/16 1315  vancomycin (VANCOCIN) 500 mg in sodium chloride 0.9 % 100 mL IVPB     500 mg 100 mL/hr over 60 Minutes Intravenous STAT 05/11/16 1304 05/11/16 1558   05/10/16 1200  imipenem-cilastatin (PRIMAXIN) 500 mg in sodium chloride 0.9 % 100 mL IVPB     500 mg 200 mL/hr over 30 Minutes Intravenous Every 6 hours 05/10/16 0846     05/09/16 1200  cefTRIAXone (ROCEPHIN) 2 g in dextrose 5 % 50 mL IVPB  Status:  Discontinued     2 g 100 mL/hr over 30 Minutes Intravenous Every 24 hours 05/09/16 0801 05/09/16 1050   05/09/16 1200  cefTRIAXone (ROCEPHIN) 1 g in dextrose 5 % 50 mL IVPB  Status:  Discontinued     1 g 100 mL/hr over 30 Minutes Intravenous Every 24 hours 05/09/16 1050 05/10/16 0833   05/09/16 1100  vancomycin (VANCOCIN) IVPB 750 mg/150 ml premix  Status:  Discontinued     750 mg 150 mL/hr over 60 Minutes Intravenous Every 12 hours 05/09/16 1051 05/11/16 1322   05/05/16 1230  fluconazole (DIFLUCAN) IVPB 100 mg  Status:  Discontinued     100 mg 50 mL/hr over 60 Minutes Intravenous Every 24 hours 05/05/16 1135 05/15/16 1503   05/03/16  1200  imipenem-cilastatin (PRIMAXIN) 500 mg in sodium chloride 0.9 % 100 mL IVPB  Status:  Discontinued     500 mg 200 mL/hr over 30 Minutes Intravenous Every 6 hours 05/03/16 1122 05/09/16 0801   05/03/16 0030  piperacillin-tazobactam (ZOSYN) IVPB 3.375 g  Status:  Discontinued     3.375 g 12.5 mL/hr over 240 Minutes Intravenous Every 8 hours 05/02/16 1647 05/03/16 1101   05/02/16 1930  fluconazole (DIFLUCAN) tablet 100 mg  Status:  Discontinued     100 mg Oral Daily 05/02/16 1816 05/05/16 1135   05/02/16 1600  piperacillin-tazobactam (ZOSYN) IVPB 3.375 g     3.375 g 100 mL/hr over 30 Minutes Intravenous  Once 05/02/16 1548 05/02/16 1720       Assessment/Plan  Gallstone pancreatitiswith infected pancreatic necrosis - NPO, Post pyloric tube feeds at goal rate of 70 ml/hr, tolerating well, d/c'd TPN  - Continue abx Primaxin 11/25>> - s/p pancreatic drainage per IR 11/28 with small amount of grey  drainage - WBC was trending down, slightly up today from yesterday - Will continue for now with conservative mgt with drain, abx, tube feeds. Surgery via rp approach is still option moving forward as well.   Constipation: - improving - continue bowel regiment    Pulm:per primary, cough has improved - Suspect developing pna chest x-ray shows left basilar atelectasis versus pneumonia - Continue imipenem  - pulmonary toilet   FEN: TF's, bowel regiment VTE: SCD's  Plan: Continue to monitor drain. CT abd/pelvis today showed abscess are slightly smaller than previous study. NPO & TF's. Ambulate. Continue antibiotics. Monitor WBC.     LOS: 18 days    Jerre SimonJessica L Chanz Cahall , [facility] 05/20/2016, 7:58 AM Pager: [phone] Consults: [phone] Mon-Fri 7:00 am-4:30 pm Sat-Sun 7:00 am-11:30 am

## 2016-05-21 ENCOUNTER — Other Ambulatory Visit (HOSPITAL_COMMUNITY): Payer: Medicare Other

## 2016-05-21 ENCOUNTER — Inpatient Hospital Stay (HOSPITAL_COMMUNITY): Payer: Medicare Other

## 2016-05-21 ENCOUNTER — Inpatient Hospital Stay
Admission: RE | Admit: 2016-05-21 | Discharge: 2016-06-26 | Disposition: A | Discharge status: Skilled Nursing Facility | Payer: Medicare Other | Source: Ambulatory Visit | Attending: Internal Medicine | Admitting: Internal Medicine

## 2016-05-21 DIAGNOSIS — Z4689 Encounter for fitting and adjustment of other specified devices: Secondary | ICD-10-CM

## 2016-05-21 DIAGNOSIS — T85698A Other mechanical complication of other specified internal prosthetic devices, implants and grafts, initial encounter: Secondary | ICD-10-CM

## 2016-05-21 DIAGNOSIS — Z0189 Encounter for other specified special examinations: Secondary | ICD-10-CM

## 2016-05-21 DIAGNOSIS — Z4803 Encounter for change or removal of drains: Secondary | ICD-10-CM

## 2016-05-21 DIAGNOSIS — Z4659 Encounter for fitting and adjustment of other gastrointestinal appliance and device: Secondary | ICD-10-CM

## 2016-05-21 DIAGNOSIS — K859 Acute pancreatitis without necrosis or infection, unspecified: Secondary | ICD-10-CM

## 2016-05-21 DIAGNOSIS — K863 Pseudocyst of pancreas: Secondary | ICD-10-CM

## 2016-05-21 DIAGNOSIS — Q268 Other congenital malformations of great veins: Secondary | ICD-10-CM

## 2016-05-21 DIAGNOSIS — Z931 Gastrostomy status: Secondary | ICD-10-CM

## 2016-05-21 DIAGNOSIS — L0291 Cutaneous abscess, unspecified: Secondary | ICD-10-CM

## 2016-05-21 LAB — GLUCOSE, CAPILLARY
GLUCOSE-CAPILLARY: 120 mg/dL — AB (ref 65–99)
GLUCOSE-CAPILLARY: 130 mg/dL — AB (ref 65–99)
Glucose-Capillary: 174 mg/dL — ABNORMAL HIGH (ref 65–99)
Glucose-Capillary: 117 mg/dL — ABNORMAL HIGH (ref 65–99)
Glucose-Capillary: 107 mg/dL — ABNORMAL HIGH (ref 65–99)

## 2016-05-21 LAB — CBC
HEMATOCRIT: 32.3 % — AB (ref 39.0–52.0)
Hemoglobin: 10.6 g/dL — ABNORMAL LOW (ref 13.0–17.0)
MCH: 30.7 pg (ref 26.0–34.0)
MCHC: 32.8 g/dL (ref 30.0–36.0)
MCV: 93.6 fL (ref 78.0–100.0)
PLATELETS: 252 10*3/uL (ref 150–400)
RBC: 3.45 MIL/uL — ABNORMAL LOW (ref 4.22–5.81)
RDW: 14.6 % (ref 11.5–15.5)
WBC: 15.9 10*3/uL — ABNORMAL HIGH (ref 4.0–10.5)

## 2016-05-21 MED ORDER — GUAIFENESIN-DM 100-10 MG/5ML PO SYRP: 5.0000 mL | ORAL_SOLUTION | Freq: Four times a day (QID) | ORAL | 0 refills | Status: AC

## 2016-05-21 MED ORDER — SODIUM BICARBONATE 650 MG PO TABS: 650.0000 mg | ORAL_TABLET | Freq: Once | ORAL | 0 refills | Status: AC

## 2016-05-21 MED ORDER — PANTOPRAZOLE SODIUM 40 MG PO PACK: 40.0000 mg | PACK | Freq: Two times a day (BID) | ORAL | 0 refills | Status: AC

## 2016-05-21 MED ORDER — JEVITY 1.2 CAL PO LIQD: 1000.0000 mL | ORAL | 0 refills | Status: AC

## 2016-05-21 MED ORDER — METOPROLOL TARTRATE 25 MG/10 ML ORAL SUSPENSION
100.0000 mg | Freq: Two times a day (BID) | ORAL | Status: AC
Start: 2016-05-21 — End: ?

## 2016-05-21 MED ORDER — SODIUM CHLORIDE 0.9 % IV SOLN: 500.0000 mg | Freq: Four times a day (QID) | INTRAVENOUS | Status: AC

## 2016-05-21 MED ORDER — PANCRELIPASE (LIP-PROT-AMYL) 12000-38000 UNITS PO CPEP
24000.0000 [IU] | ORAL_CAPSULE | Freq: Once | ORAL | Status: AC
  Administered 2016-05-21: 24000 [IU] via ORAL
  Filled 2016-05-21: qty 2

## 2016-05-21 MED ORDER — HYDROCOD POLST-CPM POLST ER 10-8 MG/5ML PO SUER: 5.0000 mL | Freq: Two times a day (BID) | ORAL | 0 refills | Status: AC | PRN

## 2016-05-21 MED ORDER — GUAIFENESIN-DM 100-10 MG/5ML PO SYRP
5.0000 mL | ORAL_SOLUTION | Freq: Four times a day (QID) | ORAL | Status: DC
  Administered 2016-05-21: 5 mL via ORAL
  Filled 2016-05-21 (×2): qty 5

## 2016-05-21 MED ORDER — METOPROLOL TARTRATE 25 MG/10 ML ORAL SUSPENSION
100.0000 mg | Freq: Two times a day (BID) | ORAL | Status: DC
  Administered 2016-05-21: 100 mg
  Filled 2016-05-21 (×2): qty 40

## 2016-05-21 MED ORDER — DOCUSATE SODIUM 50 MG/5ML PO LIQD: 50.0000 mg | Freq: Every day | ORAL | 0 refills | Status: AC

## 2016-05-21 MED ORDER — PANCRELIPASE (LIP-PROT-AMYL) 24000-76000 UNITS PO CPEP: 24000.0000 [IU] | ORAL_CAPSULE | Freq: Once | ORAL | 0 refills | Status: AC

## 2016-05-21 MED ORDER — POLYETHYLENE GLYCOL 3350 17 G PO PACK: 17.0000 g | PACK | Freq: Every day | ORAL | 0 refills | Status: AC

## 2016-05-21 MED ORDER — HEPARIN SOD (PORK) LOCK FLUSH 100 UNIT/ML IV SOLN: 250.0000 [IU] | INTRAVENOUS | Status: DC | PRN

## 2016-05-21 MED ORDER — SODIUM BICARBONATE 650 MG PO TABS
650.0000 mg | ORAL_TABLET | Freq: Once | ORAL | Status: AC
  Administered 2016-05-21: 650 mg via ORAL
  Filled 2016-05-21: qty 1

## 2016-05-21 MED ORDER — FUROSEMIDE 40 MG PO TABS: 40.0000 mg | ORAL_TABLET | Freq: Every day | ORAL | 0 refills | Status: AC

## 2016-05-21 NOTE — Progress Notes (Signed)
Brooke, GeorgiaPA made aware of clogged feeding tube. Verbal order for orderset to unclogg tube.

## 2016-05-21 NOTE — Progress Notes (Signed)
Referring Physician(s): Dr Emelia LoronMatthew Wakefield  Supervising Physician: Malachy MoanMcCullough, Heath  [facility]  Chief Complaint:  Infected pancreatic pseudocyst drain placed 11/28  Subjective:  Getting some better In bed Resting No pain No complaints Output significant + ecoli   Allergies: Patient has no known allergies.  Medications: Prior to Admission medications   Medication Sig Start Date End Date Taking? Authorizing Provider  amoxicillin-clavulanate (AUGMENTIN) 875-125 MG tablet Take 1 tablet by mouth every 12 (twelve) hours. 04/28/16  Yes Marinda ElkAbraham Feliz Ortiz, MD  atorvastatin (LIPITOR) 10 MG tablet Take 10 mg by mouth daily.   Yes Historical Provider, MD  famotidine (PEPCID) 20 MG tablet Take 1 tablet (20 mg total) by mouth 2 (two) times daily. 04/07/16  Yes Osvaldo ShipperGokul Krishnan, MD  feeding supplement, GLUCERNA SHAKE, (GLUCERNA SHAKE) LIQD Take 237 mLs by mouth 3 (three) times daily between meals. 04/07/16  Yes Osvaldo ShipperGokul Krishnan, MD  lipase/protease/amylase (CREON) 36000 UNITS CPEP capsule Take 1 capsule (36,000 Units total) by mouth 3 (three) times daily before meals. 04/07/16  Yes Osvaldo ShipperGokul Krishnan, MD  lisinopril (PRINIVIL,ZESTRIL) 2.5 MG tablet Take 1 tablet (2.5 mg total) by mouth daily. 04/29/16  Yes Marinda ElkAbraham Feliz Ortiz, MD  metFORMIN (GLUCOPHAGE-XR) 500 MG 24 hr tablet Take 500 mg by mouth daily with breakfast.   Yes Historical Provider, MD  metoCLOPramide (REGLAN) 5 MG tablet Take 5-10 mg by mouth every 8 (eight) hours as needed (for hiccups).   Yes Historical Provider, MD  metoprolol (LOPRESSOR) 100 MG tablet Take 1 tablet (100 mg total) by mouth 2 (two) times daily. 04/28/16  Yes Marinda ElkAbraham Feliz Ortiz, MD  ondansetron (ZOFRAN) 4 MG tablet Take 4 mg by mouth every 8 (eight) hours as needed for nausea or vomiting.   Yes Historical Provider, MD  potassium chloride SA (K-DUR,KLOR-CON) 20 MEQ tablet Take 1 tablet (20 mEq total) by mouth daily. 04/07/16  Yes Osvaldo ShipperGokul  Krishnan, MD  traMADol (ULTRAM) 50 MG tablet Take 1 tablet (50 mg total) by mouth every 6 (six) hours as needed for moderate pain. 04/28/16  Yes Marinda ElkAbraham Feliz Ortiz, MD     Vital Signs: BP (!) 144/73 (BP Location: Left Arm)   Pulse 85   Temp 98.6 F (37 C)   Resp 16   Ht 5\' 9"  (1.753 m)   Wt 173 lb (78.5 kg)   SpO2 98%   BMI 25.55 kg/m   Physical Exam  Abdominal: Soft. Bowel sounds are normal. There is no tenderness.  Musculoskeletal: Normal range of motion.  Neurological: He is alert.  Skin: Skin is warm and dry.  Site if drain is clean NT no bleeding Output brown milky fluid 70 cc daily + Ecoli  Nursing note and vitals reviewed.   Imaging: Ct Abdomen Pelvis W Contrast  Result Date: 05/20/2016 CLINICAL DATA:  Pancreatitis, evaluate for healing of pancreatic abscess. EXAM: CT ABDOMEN AND PELVIS WITH CONTRAST TECHNIQUE: Multidetector CT imaging of the abdomen and pelvis was performed using the standard protocol following bolus administration of intravenous contrast. CONTRAST:  100mL ISOVUE-300 IOPAMIDOL (ISOVUE-300) INJECTION 61% COMPARISON:  May 13, 2016, May 09, 2016 FINDINGS: Lower chest: There is a small right pleural effusion and small to moderate left pleural effusion. Mild dependent atelectasis of posterior lung bases are noted. The heart size is normal. Hepatobiliary: There is diffuse fatty infiltration of liver. No focal liver lesion is identified. The gallbladder is normal. Pancreas: Pancreatic necrosis with multi focal abscesses are identified throughout the pancreas with a drainage  catheter in place. The fluid volume within the multi focal abscesses are slightly smaller compared to prior exam. Spleen: Normal in size without focal abnormality. Adrenals/Urinary Tract: Stable left adrenal enlargement is unchanged. The right adrenal gland is normal. Right nephrolithiasis is noted. Small cysts are identified in both kidneys unchanged. There is no hydronephrosis.  Stomach/Bowel: A feeding tube is identified with distal tip in the proximal jejunum. There are mild thickened walled proximal small bowel loops likely due to inflammation from adjacent pancreatitis. There is no small bowel obstruction. There is diverticulosis of colon without diverticulitis. Vascular/Lymphatic: Aortic atherosclerosis. No enlarged abdominal or pelvic lymph nodes. Reproductive: Prostate calcifications are identified. Other: Ascites is identified in the abdomen pelvis not significantly changed. Musculoskeletal: Extensive degenerative joint changes of the spine are noted. IMPRESSION: Pancreatic necrosis with multi focal abscesses are identified throughout the pancreas with a drainage catheter in place. The fluid volume within the multi focal abscesses are slightly smaller compared to prior exam. Persistent ascites. Mild thickened walled proximal small bowel loops likely due to inflammation from adjacent pancreatitis. There is no evidence of small bowel obstruction. Electronically Signed   By: Sherian ReinWei-Chen  Lin M.D.   On: 05/20/2016 07:29   Dg Chest Port 1 View  Result Date: 05/18/2016 CLINICAL DATA:  Shortness of breath. EXAM: PORTABLE CHEST 1 VIEW COMPARISON:  May 15, 2016 FINDINGS: Feeding tube terminates below today's film. Stable right PICC line. No pneumothorax. Low lung volumes on the right with no focal infiltrate. Layering effusion and underlying opacity on the left were not seen previously. Stable cardiomediastinal silhouette. IMPRESSION: New layering effusion and underlying opacity in the left base. Electronically Signed   By: Gerome Samavid  Williams III M.D   On: 05/18/2016 07:32    Labs:  CBC:  Recent Labs  05/16/16 0248 05/17/16 1002 05/19/16 0300 05/20/16 0202  WBC 16.6* 14.2* 12.2* 13.7*  HGB 10.3* 9.8* 10.0* 10.1*  HCT 31.2* 29.9* 30.3* 30.8*  PLT 245 259 216 222    COAGS:  Recent Labs  03/27/16 0500 04/23/16 0507 05/03/16 0242 05/12/16 1415  INR 1.23 1.39 1.43  1.28    BMP:  Recent Labs  05/15/16 0600 05/16/16 0248 05/19/16 0300 05/20/16 0202  NA 131* 134* 134* 133*  K 4.0 3.9 3.5 3.5  CL 100* 98* 100* 101  CO2 26 30 28 26   GLUCOSE 158* 154* 123* 101*  BUN 18 17 14 11   CALCIUM 8.0* 8.1* 7.8* 7.8*  CREATININE 0.63 0.76 0.61 0.62  GFRNONAA >60 >60 >60 >60  GFRAA >60 >60 >60 >60    LIVER FUNCTION TESTS:  Recent Labs  05/12/16 0541 05/14/16 0229 05/15/16 0600 05/16/16 0248  BILITOT 0.7 0.9 0.6 0.5  AST 45* 35 25 33  ALT 39 32 23 25  ALKPHOS 299* 261* 209* 193*  PROT 5.4* 5.0* 5.1* 5.1*  ALBUMIN 1.5* 1.5* 1.4* 1.4*    Assessment and Plan:  Infected pancreatic pseudocyst Drain placed 05/13/16 draining well Will follow  Electronically Signed: Pauline Trainer A 05/21/2016, 10:45 AM   I spent a total of 15 Minutes at the the patient's bedside AND on the patient's hospital floor or unit, greater than 50% of which was counseling/coordinating care for pseudocyst drain

## 2016-05-21 NOTE — Care Management Note (Addendum)
Case Management Note  Patient Details  Name: Farris HasGraham N Appenzeller MRN: [id number] Date of Birth: 07-23-1933  Subjective/Objective:                    Action/Plan:  Patient can go to SNF for Nursing needs , however see SW note regarding tube feeding  Documented patient ambulated 400 feet twice yesterday . Called PT office spoke to Toniann FailWendy to have patient reassessed by PT for recommendations.   SW concern insurance will not cover SNF if patient ambulating 400 feet.   Will await recommendations Expected Discharge Date:                  Expected Discharge Plan:     In-House Referral:  Clinical Social Work  Discharge planning Services  CM Consult  Post Acute Care Choice:  NA Choice offered to:  NA  DME Arranged:  N/A DME Agency:  NA  HH Arranged:  NA HH Agency:  NA  Status of Service:  In process, will continue to follow  If discussed at Long Length of Stay Meetings, dates discussed:    Additional Comments:  Kingsley PlanWile, Noell Shular Marie, RN 05/21/2016, 10:15 AM

## 2016-05-21 NOTE — Discharge Summary (Signed)
[facility] Discharge Summary   Patient: Colin West ZOX:[id number]   PCP: Verl BangsADIONTCHENKO, ALEXEI, MD DOB: 1933-07-25   Date of admission: 05/02/2016   Date of discharge:  05/21/2016    Discharge Diagnoses:  Principal Problem:   Pancreatic abscess Active Problems:   Essential hypertension   Hyperbilirubinemia   Malnutrition of moderate degree   Leukocytosis   History of acute pancreatitis   Congestive dilated cardiomyopathy (HCC)   Tinea cruris   Sacral decubitus ulcer, stage II   Encounter for feeding tube placement   Protein-calorie malnutrition, severe   Admitted From: home Disposition:  LTAC  Recommendations for Outpatient Follow-up:  1. Please follow up with PCP in 1 week with CBC and BMP 2. Please follow up with surgery in 2 week.   Follow-up Information    NEWMAN,DAVID H, MD. Schedule an appointment as soon as possible for a visit in 2 week(s).   Specialty:  General Surgery Contact information: [address] [place] [address] [phone]        Verl BangsADIONTCHENKO, ALEXEI, MD. Schedule an appointment as soon as possible for a visit in 1 week(s).   Specialty:  Family Medicine Contact information: [address] [place] [address] [phone]          Diet recommendation: via post-pyloric tube.  Activity: The patient is advised to gradually reintroduce usual activities.  Discharge Condition: stable  Code Status: full code  History of present illness: As per the H and P dictated on admission, "Colin HasGraham N West is a 82 y.o. male  with complex history recently hospitalized from 10/8-10/12 for hemorrhagic pancreatitis from gallblader source, and on  11/7-11/13 for Severe Sepsis due to E.Coli pyelonephritis, discharged to  [facility] on oral antibiotics, brought to the emergency department with worsening generalized weakness, today  evere enough with  associated nausea and vomiting. He denies shortness of breath or productive cough.  He has been unable to ambulate due to weakness. He denies any abdominal pain, he has occasional nausea without vomiting, he denies any diarrhea or constipation. He has not been able to eat a normal meal, lost about 15 lbs in the last 2 months.  He reports frequent hiccups. He denies any lower extremity swelling. Due to prolonged bed rest, he has developed some the ulcers in the sacrum and gluteal  areas as well as some minimal heel blanching hese is being cared at the facility as well. No bleeding issues are reported such as hemoptysis, cemetery messages, hematuria, or hematochezia"  Hospital Course:  Summary of his active problems in the hospital is as following.  Multiple Pancreatic Abscesses / pancreatic necrosis/ileus with recent hemorrhagic pancreatitis precipitated by gallstones - CT scan showed multiple peripancreatic abscesses, was recently in the hospital from 10/8-10/12 for hemorrhagic pancreatitis from gallbladder source. - Pancreatic abscess aspiration now growing Escherichia coli, [facility] recommends to continue Primaxin for next few weeks.  - strict NPO with postpyloric feeding. - repeat CT abdomen / pelvis with persistent multifocal abscess and pancreatic necrosis, looking somewhat improved.  Acute on chronic systolic CHF in the setting of dilated Cardiomyopathy in view of recent sepsis - Cardiology reconsulted 11/22 - if patient requires laparoscopic surgery - moderate risk, if patient requires open surgery, it would be considered high risk, at this point cannot rule out ischemia, though no further cardiac testing recommended. Continue beta blocker. Proceed with surgery if indicated - Repeat 2-D echo shows mild improvement in EF to 35-40%.  - Cautiously monitor fluid status,  monitor weights. Pt was up 17L, CXR with fluid overload, given Lasix 40 mg iv. - weight ~160 on admission, now 192 >> 184 >> 173 continue oral lasix Monitor bmp  Cough /probable HCAP - Suspect  developing pna chest x-ray shows left basilar atelectasis versus pneumonia - Counseled RN to maintain strict aspiration precautions and pulmonary toilet, leukocytosis improving - on Imipenem as above Mild leucocytosis is present, but need to monitor.   Protein calorie malnutrition, severe  - CORTRAK in place, continue tube feeds cortrak was unclogged prior to discharge.   Atrial fibrillation, paroxysmal - Transient atrial fibrillation with heart rate 130s, back to sinus rhythm with IV/by mouth metoprolol. Continue oral metoprolol - TSH 2.319 - Cardiology has intermittently followed the patient during this hospitalization and during previous hospitalizations, defer anticoagulation once pt is stable from surgical point of view.   Type II Diabetes  - CBGs controlled 120-140s Continue sliding scale.   Hypertensionstable - Controlled - Continue home anti-hypertensive medications   Hyperlipidemia - Continue home statins  Encephalopathy -Minimize narcotics, resolved  Antimicrobials:  Imipenem 11/18 >>   Vancomycin 11/24 >>11/30  Fluconazole 11/20 >> 11/30  All other chronic medical condition were stable during the hospitalization.  Patient was seen by physical therapy, LTAC was arranged by Child psychotherapist and case Production designer, theatre/television/film. On the day of the discharge the patient's vitals were stable, and no other acute medical condition were reported by patient. the patient was felt safe to be discharge at [facility] with therapy.  Procedures and Results:  Echocardiography Study Conclusions  - Procedure narrative: Transthoracic echocardiography. Image   quality was adequate. The study was technically difficult. - Left ventricle: The cavity size was normal. Wall thickness was   increased in a pattern of mild LVH. Systolic function was   moderately reduced. The estimated ejection fraction was in the   range of 35% to 40%. Diffuse hypokinesis. Doppler parameters are   consistent with  abnormal left ventricular relaxation (grade 1   diastolic dysfunction). - Aortic valve: Moderately calcified annulus. Trileaflet; mildly   calcified leaflets. - Mitral valve: Calcified annulus. There was mild regurgitation. - Tricuspid valve: There was trivial regurgitation. - Pulmonary arteries: Systolic pressure could not be accurately   estimated. - Pericardium, extracardiac: There was no pericardial effusion.  Impressions:  - Mild LVH with LVEF approximately 35-40%, diffuse hypokinesis.   Grade 1 diastolic dysfunction. Mitral annular calcification with   mild mitral regurgitation. Mildly sclerotic aortic valve. Trivial   tricuspid regurgitation.   CT guided drainage of infected pancreatic pseudocyst 05/13/2016  aspiration of peripancreatic fluid 05/03/2016  LAPAROSCOPIC EXPLORATION BILATERAL INGUINAL HERNIA REPAIR LAPAROSCOPIC LYSIS OF ADHESIONS x 30 min (1/2 case) INSERTION OF MESH  Consultations:  General surgery   Interventional Radiology  Cardiology  DISCHARGE MEDICATION: Current Discharge Medication List    START taking these medications   Details  chlorpheniramine-HYDROcodone (TUSSIONEX) 10-8 MG/5ML SUER Take 5 mLs by mouth every 12 (twelve) hours as needed for cough. Qty: 140 mL, Refills: 0    docusate (COLACE) 50 MG/5ML liquid Place 5 mLs (50 mg total) into feeding tube daily. Qty: 100 mL, Refills: 0    furosemide (LASIX) 40 MG tablet Take 1 tablet (40 mg total) by mouth daily. Qty: 30 tablet, Refills: 0    guaiFENesin-dextromethorphan (ROBITUSSIN DM) 100-10 MG/5ML syrup Take 5 mLs by mouth 4 (four) times daily. Qty: 118 mL, Refills: 0    imipenem-cilastatin 500 mg in sodium chloride 0.9 % 100 mL Inject 500 mg into the  vein every 6 (six) hours.    metoprolol tartrate (LOPRESSOR) 25 mg/10 mL SUSP Place 40 mLs (100 mg total) into feeding tube 2 (two) times daily.    pantoprazole sodium (PROTONIX) 40 mg/20 mL PACK Place 20 mLs (40 mg total) into  feeding tube 2 (two) times daily. Qty: 30 each, Refills: 0    polyethylene glycol (MIRALAX / GLYCOLAX) packet Take 17 g by mouth daily. Qty: 14 each, Refills: 0    sodium bicarbonate 650 MG tablet Take 1 tablet (650 mg total) by mouth once. Qty: 1 tablet, Refills: 0      CONTINUE these medications which have CHANGED   Details  lipase/protease/amylase 24000-76000 units CPEP Take 1 capsule (24,000 Units total) by mouth once. Qty: 1 capsule, Refills: 0    Nutritional Supplements (FEEDING SUPPLEMENT, JEVITY 1.2 CAL,) LIQD Place 1,000 mLs into feeding tube continuous. Qty: 237 mL, Refills: 0      CONTINUE these medications which have NOT CHANGED   Details  metoCLOPramide (REGLAN) 5 MG tablet Take 5-10 mg by mouth every 8 (eight) hours as needed (for hiccups).    ondansetron (ZOFRAN) 4 MG tablet Take 4 mg by mouth every 8 (eight) hours as needed for nausea or vomiting.      STOP taking these medications     amoxicillin-clavulanate (AUGMENTIN) 875-125 MG tablet      atorvastatin (LIPITOR) 10 MG tablet      famotidine (PEPCID) 20 MG tablet      lisinopril (PRINIVIL,ZESTRIL) 2.5 MG tablet      metFORMIN (GLUCOPHAGE-XR) 500 MG 24 hr tablet      metoprolol (LOPRESSOR) 100 MG tablet      potassium chloride SA (K-DUR,KLOR-CON) 20 MEQ tablet      traMADol (ULTRAM) 50 MG tablet        No Known Allergies Discharge Instructions    Increase activity slowly    Complete by:  As directed      Discharge Exam: Filed Weights   05/19/16 0305 05/20/16 0400 05/21/16 0453  Weight: 83.5 kg (184 lb) 81.2 kg (179 lb) 78.5 kg (173 lb)   Vitals:   05/21/16 0900 05/21/16 1339  BP: (!) 144/73 114/81  Pulse: 85 (!) 108  Resp:    Temp:  98.6 F (37 C)   General: Appear in mild distress, no Rash; Oral Mucosa moist. Cardiovascular: S1 and S2 Present, no Murmur, no JVD Respiratory: Bilateral Air entry present and Clear to Auscultation, no Crackles, no wheezes Abdomen: Bowel Sound present,  Soft and mild tenderness Extremities: no Pedal edema, no calf tenderness Neurology: Grossly no focal neuro deficit.  The results of significant diagnostics from this hospitalization (including imaging, microbiology, ancillary and laboratory) are listed below for reference.    Significant Diagnostic Studies: Ct Abdomen Pelvis Wo Contrast  Result Date: 04/22/2016 CLINICAL DATA:  Leukocytosis, sepsis. History of recent pancreatitis. EXAM: CT ABDOMEN AND PELVIS WITHOUT CONTRAST TECHNIQUE: Multidetector CT imaging of the abdomen and pelvis was performed following the standard protocol without IV contrast. COMPARISON:  CT from 04/03/2016 FINDINGS: Lower chest: Bibasilar atelectasis. Stable scarring at the right base. No pneumonic consolidation or pneumothorax. Old right sixth and seventh rib fractures. Normal size cardiac chambers without significant pericardial effusion. Coronary arteriosclerosis is seen. Hepatobiliary: Hepatic steatosis. No space-occupying mass of the liver though assessment is limited by lack of IV contrast. Uncomplicated cholelithiasis. No biliary dilatation. Pancreas: The pancreas line is difficult to discretely identify due to the surrounding peripancreatic fatty inflammation and edema. This appears overall  stable without apparent findings of pancreatic necrosis nor pseudocyst formation. Spleen: Normal in size without focal abnormality. Adrenals/Urinary Tract: Normal normal bilateral adrenal glands. Two punctate interpolar right renal calculi without obstructive uropathy. Apparent passage of the left UVJ stone since prior exam. No hydroureteronephrosis is noted. Stable bilateral perinephric fat stranding. Stomach/Bowel: Stomach is moderately distended with enteric contrast. No bowel obstruction or definite inflammation. Extensive colonic diverticulosis is noted along the colon, in particular involving the sigmoid colon. No findings of acute diverticulitis. No definite abscess. Colonic  interposition of the liver seen. There is a normal-appearing appendix. Vascular/Lymphatic: Aortic and branch vessel atherosclerosis. No intraperitoneal, retroperitoneal, pelvic sidewall or inguinal lymphadenopathy. Reproductive: The prostate is enlarged with calcifications as before at the junction of the central and peripheral zone. Other: No abdominal wall hernia or abnormality. No abdominopelvic ascites. Musculoskeletal: No worrisome lytic or sclerotic osseous lesions. IMPRESSION: 1. Stable sequela of pancreatitis without pancreatic necrosis, definite abscess on this unenhanced study nor pseudocyst formation. 2. Uncomplicated cholelithiasis. 3. Passage of left UVJ stone since prior exam. 4. Stable hepatic steatosis. 5. Abdominal aortic atherosclerosis. Electronically Signed   By: Tollie Eth M.D.   On: 04/22/2016 18:07   Dg Chest 1 View  Result Date: 05/08/2016 CLINICAL DATA:  Coughing episode EXAM: CHEST 1 VIEW COMPARISON:  05/03/2016 FINDINGS: Semi-erect portable view chest. A right upper extremity catheter tip overlies the cavoatrial junction. Esophageal tube tip is not included but probably positioned over duodenum. Markedly low lung volumes. Probable tiny effusions. Mild bibasilar atelectasis. No focal consolidations. Stable cardiomediastinal silhouette. No pneumothorax. Postsurgical changes in the right lung apex. Old right-sided rib deformities. IMPRESSION: 1. Markedly low lung volumes with suspected tiny effusions. 2. Mild bibasilar atelectasis. Electronically Signed   By: Jasmine Pang M.D.   On: 05/08/2016 16:33   Dg Chest 1 View  Result Date: 04/26/2016 CLINICAL DATA:  Productive cough, diabetes, hypertension EXAM: CHEST 1 VIEW COMPARISON:  He 04/24/2016 FINDINGS: Right PICC line tip remains at the SVC RA junction level. Low lung volumes persist with slight interstitial prominence as before. Postop changes in the right lung. No developing focal pneumonia, collapse or consolidation. Negative  for effusion or pneumothorax. Trachea is midline. IMPRESSION: Stable low lung volumes and slight interstitial prominence, nonspecific. Stable postop changes in the right lung No significant interval change. Electronically Signed   By: Judie Petit.  Shick M.D.   On: 04/26/2016 09:16   X-ray Chest Pa And Lateral  Result Date: 05/03/2016 CLINICAL DATA:  Preoperative examination.  PICC placement. EXAM: CHEST  2 VIEW COMPARISON:  PA and lateral chest 05/02/2016. FINDINGS: New right PICC is in place with the tip near the superior cavoatrial junction. Lung volumes are low but the lungs are clear. Heart size is normal. No pneumothorax or pleural effusion. Remote right rib fractures are seen. Postoperative change right lung apex and right shoulder noted. IMPRESSION: No active cardiopulmonary disease. Electronically Signed   By: Drusilla Kanner M.D.   On: 05/03/2016 09:26   Dg Chest 2 View  Result Date: 05/02/2016 CLINICAL DATA:  Weakness and fever EXAM: CHEST  2 VIEW COMPARISON:  04/26/2016; 03/28/2016; 05/31/2013 ; CT abdomen pelvis - 04/22/2016 FINDINGS: Grossly unchanged cardiac silhouette and mediastinal contours with atherosclerotic plaque within thoracic aorta. Lung volumes were reduced. Stable postsurgical change of the right upper lung with volume loss and elevation involving the anterior aspect the right hemidiaphragm. Note is again made of a interposed loop of hepatic flexure of the colon below the right hemidiaphragm as seen on  prior abdominal CT. Grossly unchanged mild diffuse slightly nodular thickening of the pulmonary interstitium. No discrete focal airspace opacities. No pleural effusion or pneumothorax. No evidence of edema. No acute osseus abnormalities. Post right rotator cuff repair. Degenerative change within the lower thoracic and upper lumbar spine. IMPRESSION: 1. Similar findings of decreased lung volumes without acute cardiopulmonary disease. No new focal airspace opacities to suggest pneumonia. 2.  Stable postsurgical change of the right upper lung with associated volume loss. Electronically Signed   By: Simonne Come M.D.   On: 05/02/2016 13:27   Dg Chest 2 View  Result Date: 04/22/2016 CLINICAL DATA:  Strain fatigue and left G which began earlier today. History of diabetes and hypertension and unspecified lung surgery. Former smoker. EXAM: CHEST  2 VIEW COMPARISON:  Portable chest x-ray of March 28, 2016 FINDINGS: There is mild chronic volume loss on the right with elevation of the hemidiaphragm. The aerated portion of the right lung is clear. The left lung is clear. The heart and pulmonary vascularity are normal. There is old deformity of the posterior aspect of the right sixth and seventh ribs. There is degenerative change of the left shoulder. The thoracic spine exhibits mild multilevel degenerative disc space narrowing. IMPRESSION: Chronic volume loss on the right with mild elevation of the hemidiaphragm. No evidence of pneumonia, CHF, nor other acute cardiopulmonary abnormality. Electronically Signed   By: David  [place] M.D.   On: 04/22/2016 15:39   Dg Abd 1 View  Result Date: 05/08/2016 CLINICAL DATA:  NG tube placement EXAM: ABDOMEN - 1 VIEW COMPARISON:  05/06/2016 FINDINGS: Motion artifact limits the examination. Esophageal tube tip overlies the second portion of the duodenum. Some gaseous dilatation of bowel could relate to a mild ileus. IMPRESSION: Esophageal tube tip overlies the second portion of the duodenum. Mild gaseous dilatation of bowel could relate to a mild ileus. Electronically Signed   By: Jasmine Pang M.D.   On: 05/08/2016 16:35   Ct Abdomen Pelvis W Contrast  Result Date: 05/20/2016 CLINICAL DATA:  Pancreatitis, evaluate for healing of pancreatic abscess. EXAM: CT ABDOMEN AND PELVIS WITH CONTRAST TECHNIQUE: Multidetector CT imaging of the abdomen and pelvis was performed using the standard protocol following bolus administration of intravenous contrast. CONTRAST:   ISOVUE-300 IOPAMIDOL (ISOVUE-300) INJECTION 61% COMPARISON:  May 13, 2016, May 09, 2016 FINDINGS: Lower chest: There is a small right pleural effusion and small to moderate left pleural effusion. Mild dependent atelectasis of posterior lung bases are noted. The heart size is normal. Hepatobiliary: There is diffuse fatty infiltration of liver. No focal liver lesion is identified. The gallbladder is normal. Pancreas: Pancreatic necrosis with multi focal abscesses are identified throughout the pancreas with a drainage catheter in place. The fluid volume within the multi focal abscesses are slightly smaller compared to prior exam. Spleen: Normal in size without focal abnormality. Adrenals/Urinary Tract: Stable left adrenal enlargement is unchanged. The right adrenal gland is normal. Right nephrolithiasis is noted. Small cysts are identified in both kidneys unchanged. There is no hydronephrosis. Stomach/Bowel: A feeding tube is identified with distal tip in the proximal jejunum. There are mild thickened walled proximal small bowel loops likely due to inflammation from adjacent pancreatitis. There is no small bowel obstruction. There is diverticulosis of colon without diverticulitis. Vascular/Lymphatic: Aortic atherosclerosis. No enlarged abdominal or pelvic lymph nodes. Reproductive: Prostate calcifications are identified. Other: Ascites is identified in the abdomen pelvis not significantly changed. Musculoskeletal: Extensive degenerative joint changes of the spine are noted. IMPRESSION: Pancreatic  necrosis with multi focal abscesses are identified throughout the pancreas with a drainage catheter in place. The fluid volume within the multi focal abscesses are slightly smaller compared to prior exam. Persistent ascites. Mild thickened walled proximal small bowel loops likely due to inflammation from adjacent pancreatitis. There is no evidence of small bowel obstruction. Electronically Signed   By: Sherian Rein  M.D.   On: 05/20/2016 07:29   Ct Abdomen Pelvis W Contrast  Result Date: 05/09/2016 CLINICAL DATA:  Hemorrhagic pancreatitis, nausea, vomiting. EXAM: CT ABDOMEN AND PELVIS WITH CONTRAST TECHNIQUE: Multidetector CT imaging of the abdomen and pelvis was performed using the standard protocol following bolus administration of intravenous contrast. CONTRAST:  ISOVUE-300 IOPAMIDOL (ISOVUE-300) INJECTION 61% COMPARISON:  CT scan of May 02, 2016. FINDINGS: Lower chest: Mild left pleural effusion is noted with adjacent subsegmental atelectasis. Hepatobiliary: No gallstones are noted. Mild amount of fluid is noted around the liver. No focal hepatic lesion is noted. Pancreas: There is again noted multifocal abscesses within the retroperitoneum surrounding atretic pancreas. Increased amount of gas is present within the larger loculations. Overall, does not appear to be significantly changed in size. Spleen: Normal. Adrenals/Urinary Tract: Right adrenal gland appears normal. Stable left adrenal gland enlargement is noted. Right nephrolithiasis is noted. No hydronephrosis or renal obstruction is noted. Urinary bladder appears normal. Stomach/Bowel: Feeding tube is seen with distal tip in third portion of duodenum. There is no evidence of bowel obstruction. Normal appendix. Diverticulosis of descending and sigmoid colon is noted without inflammation. Vascular/Lymphatic: Atherosclerosis of abdominal aorta is noted without aneurysm formation. Celiac, superior mesenteric and and inferior mesenteric arteries are patent. Reproductive: Stable mild prostatic enlargement with calcifications is noted. Other: Moderate anasarca is noted. Musculoskeletal: Severe multifocal degenerative disc disease is noted. IMPRESSION: Mild left pleural effusion with adjacent subsegmental atelectasis. Aortic atherosclerosis. Feeding tube seen with distal tip in third portion of duodenum. Diverticulosis of descending and sigmoid colon is noted  without inflammation. Stable mild prostatic enlargement. Moderate anasarca. Continued presence of multifocal abscesses within the retroperitoneal surrounding atretic pancreas, which does not appear to be significantly is changed in size, although there is an increased amount of gas present within the loculations consistent with infection. Electronically Signed   By: Lupita Raider, M.D.   On: 05/09/2016 17:15   Ct Abdomen Pelvis W Contrast  Result Date: 05/02/2016 CLINICAL DATA:  Hemorrhagic pancreatitis with periumbilical pain. EXAM: CT ABDOMEN AND PELVIS WITH CONTRAST TECHNIQUE: Multidetector CT imaging of the abdomen and pelvis was performed using the standard protocol following bolus administration of intravenous contrast. CONTRAST:  75 cc of Isovue-300 IV COMPARISON:  04/22/2016 FINDINGS: Lower chest: There is bibasilar dependent atelectasis. Three-vessel coronary arteriosclerosis. No pericardial effusion. Heart size is within normal to the extent visible. There is aortic atherosclerosis of the descending aorta. There is a small hiatal hernia. Hepatobiliary: No space-occupying mass or abscess of the liver. Slight surface nodularity of the liver consistent cirrhosis. Gallstones are seen within a physiologically distended gallbladder. There is colonic interposition large bowel between the liver and ventral abdominal wall. Pancreas: There are multifocal abscesses within the phlegmonous inflammatory soft tissue mass outlining the pancreas which have developed since the interim. There is an approximately 5.8 x 3.5 x 5.6 cm abscess immediately anterior to the third portion of the duodenum interposed between the duodenum and SMA, series 2, image 46, series 5, image 52. There is an approximately 5.9 x 4.2 x 4.2 cm right upper quadrant abscess just anterior to the junction  of the second and third portion of the duodenum, series 2 image 40 all, series 5, image 44. There is an approximately 6.1 x 3.9 x 3.3 cm  abscess with air just anterior to the distal third portion of the duodenum and overlying the SMA, series 2 image 35 and series 5 image 44. Smaller foci of air seen within the phlegmonous mass further cephalad anterior to the pancreatic head and midbody, series 2, image 30. Faint enhancement of the atretic pancreatic gland is seen within the phlegmonous mass. There appears to have been some necrosis of portions of the pancreatic body and head since initial study from 04/03/2016. The portal and splenic veins remain patent with slight narrowing or mass-effect on the mid to distal splenic vein, series 2, image 32 . Spleen: Normal in size without focal abnormality. Adrenals/Urinary Tract: Stable appearance of the adrenal glands and kidneys without obstructive uropathy. Bladder is partially distended. Stomach/Bowel: There is mild sympathetic thickening of small intestine in the region of the pancreas. No bowel obstruction is noted. Vascular/Lymphatic: Aortoiliac and branch vessel atherosclerosis. The celiac vessels, SMA and IMA appear patent. Reproductive: Prostate is mildly enlarged up to 5.3 cm with peripheral zone calcifications. Other: Small amount of fluid is seen in the pelvis with trace edema along the pericolic gutters and adjacent to the liver. Musculoskeletal: Thoracolumbar degenerative disc disease consistent with spondylosis. No acute osseous abnormality. 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 Decrease in pancreatic gland enhancement consistent with probable areas of necrosis since the initial study. Patent splenic and portal veins and SMA. Splenic vein is slightly narrowed along its midportion from the phlegmonous mass. Critical Value/emergent results were called by telephone at the time of interpretation on 05/02/2016 at 3:22 pm to Dr. Raeford RazorSTEPHEN KOHUT , who verbally acknowledged these results.  Electronically Signed   By: Tollie Ethavid  Kwon M.D.   On: 05/02/2016 15:22   Ct Aspiration  Result Date: 05/03/2016 INDICATION: Pancreatitis with peripancreatic abscess/pseudocyst. Please perform CT-guided aspiration for the acquisition of a sample for laboratory analysis. EXAM: CT GUIDANCE NEEDLE PLACEMENT COMPARISON:  CT abdomen pelvis - 05/02/2016 ; 04/22/2016 MEDICATIONS: The patient is currently admitted to the hospital and receiving intravenous antibiotics. The antibiotics were administered within an appropriate time frame prior to the initiation of the procedure. ANESTHESIA/SEDATION: Moderate (conscious) sedation was employed during this procedure. A total of Versed 1 mg and Fentanyl 75 mcg was administered intravenously. Moderate Sedation Time: 15 minutes. The patient's level of consciousness and vital signs were monitored continuously by radiology nursing throughout the procedure under my direct supervision. CONTRAST:  None COMPLICATIONS: None immediate. PROCEDURE: Informed written consent was obtained from the patient after a discussion of the risks, benefits and alternatives to treatment. The patient was placed supine, slightly RPO on the CT gantry and a pre procedural CT was performed re-demonstrating the known ill-defined abscess/fluid collection about the pancreas. The procedure was planned. A timeout was performed prior to the initiation of the procedure. The skin overlying the left lateral abdomen was prepped and draped in the usual sterile fashion. The overlying soft tissues were anesthetized with 1% lidocaine with epinephrine. Appropriate trajectory was planned with the use of a 22 gauge spinal needle. An 18 gauge trocar needle was advanced into the abscess/fluid collection and a short Amplatz super stiff wire was coiled within the collection. Appropriate positioning was confirmed with a limited CT scan. The tract was dilated allowing placement of a temporary 8 [place]French all-purpose  drainage catheter.  Approximately 75 cc of purulent fluid was aspirated as the 8 French drainage catheter was slowly retracted. A dressing was placed. The patient tolerated the procedure well without immediate post procedural complication. IMPRESSION: Successful CT guided aspiration of approximately 75 cc of purulent fluid from the peripancreatic fluid collection/pseudocyst. Samples were sent to the laboratory as requested by the ordering clinical team. Electronically Signed   By: Simonne Come M.D.   On: 05/03/2016 11:50   Dg Chest Port 1 View  Result Date: 05/18/2016 CLINICAL DATA:  Shortness of breath. EXAM: PORTABLE CHEST 1 VIEW COMPARISON:  May 15, 2016 FINDINGS: Feeding tube terminates below today's film. Stable right PICC line. No pneumothorax. Low lung volumes on the right with no focal infiltrate. Layering effusion and underlying opacity on the left were not seen previously. Stable cardiomediastinal silhouette. IMPRESSION: New layering effusion and underlying opacity in the left base. Electronically Signed   By: Gerome Sam III M.D   On: 05/18/2016 07:32   Dg Chest Port 1 View  Result Date: 05/15/2016 CLINICAL DATA:  Dyspnea. History of hypertension and diabetes, remote history of smoking. EXAM: PORTABLE CHEST 1 VIEW COMPARISON:  Portable chest x-ray of May 12, 2016 FINDINGS: The lungs remain hypoinflated. The appearance is accentuated by the lordotic positioning. The interstitial markings are coarse. There is no alveolar infiltrate. There is no pleural effusion. The heart and pulmonary vascularity are normal. There is tortuosity of the ascending and descending thoracic aorta. The feeding tube tip projects below the inferior margin of the image. A pigtail catheter projects over the mid upper abdomen in the midline. IMPRESSION: Bilateral hypoinflation. Mild interstitial prominence in part due to vascular crowding but mild interstitial edema or infiltrate may be developing. No alveolar pneumonia.  Electronically Signed   By: David  [place] M.D.   On: 05/15/2016 08:07   Dg Chest Port 1 View  Result Date: 05/12/2016 CLINICAL DATA:  Cough, history of hypertension, hyperlipidemia, and diabetes. EXAM: PORTABLE CHEST 1 VIEW COMPARISON:  Portable chest x-ray of May 08, 2016 FINDINGS: The lung volumes remain low. There is increased density at the left lung base with new obscuration of the hemidiaphragm. The heart and pulmonary vascularity are normal. There is tortuosity of the ascending and descending thoracic aorta. The PICC line tip on the right projects over the midportion of the SVC. The feeding tube tip projects below the inferior margin of the image beyond the second portion of the duodenum. IMPRESSION: Increased density at the left lung base consistent with atelectasis or pneumonia with small left pleural effusion. Stable bilateral hypo nflation. Electronically Signed   By: David  [place] M.D.   On: 05/12/2016 09:27   Dg Chest Port 1 View  Result Date: 04/24/2016 CLINICAL DATA:  Central line placement EXAM: PORTABLE CHEST 1 VIEW COMPARISON:  04/24/2016 400 hours FINDINGS: Right upper extremity PICC placed. Tip is at the cavoatrial junction. Low volumes. Stable ovoid density towards the right apex. Normal heart size. No pneumothorax. IMPRESSION: Right upper extremity PICC placed. Tip is at the cavoatrial junction. Electronically Signed   By: Jolaine Click M.D.   On: 04/24/2016 13:31   Dg Chest Port 1 View  Result Date: 04/24/2016 CLINICAL DATA:  Short of breath EXAM: PORTABLE CHEST 1 VIEW COMPARISON:  04/22/2016 FINDINGS: Normal heart size. Ovoid density at the right apex is stable. Lungs are very under aerated but otherwise grossly clear. Chronic right rib deformities. No pneumothorax. IMPRESSION: Stable examination.  Stable ovoid density at the right apex.  Electronically Signed   By: Jolaine Click M.D.   On: 04/24/2016 07:56   Dg Abd Portable 1v  Result Date: 05/10/2016 CLINICAL DATA:  NG  feeding tube placement EXAM: PORTABLE ABDOMEN - 1 VIEW COMPARISON:  None. FINDINGS: Mild gaseous distended small bowel loops in mid abdomen probable mild ileus. There is NG feeding tube with tip in proximal jejunum. IMPRESSION: NG feeding tube with tip in proximal jejunum. Electronically Signed   By: Natasha Mead M.D.   On: 05/10/2016 13:28   Dg Abd Portable 1v  Result Date: 05/06/2016 CLINICAL DATA:  Feeding tube placement EXAM: PORTABLE ABDOMEN - 1 VIEW COMPARISON:  05/03/2016 FINDINGS: Upper abdomen is non included. Nonobstructed gas pattern. Esophageal tube tip overlies the junction of second and third portion of duodenum. IMPRESSION: Esophageal tube tip overlies the junction of second and third portion of duodenum. Electronically Signed   By: Jasmine Pang M.D.   On: 05/06/2016 19:09   Dg Abd Portable 1v  Result Date: 05/03/2016 CLINICAL DATA:  Enteric tube placement. EXAM: PORTABLE ABDOMEN - 1 VIEW COMPARISON:  05/03/2016 CT FINDINGS: A small bore feeding tube is identified with tip overlying the descending duodenum. The bowel gas pattern is unremarkable. No acute bony abnormalities are identified. IMPRESSION: Small bore feeding tube with tip overlying the descending duodenum. Electronically Signed   By: Harmon Pier M.D.   On: 05/03/2016 17:39   Dg Abd Portable 2v  Result Date: 05/15/2016 CLINICAL DATA:  Constipation EXAM: PORTABLE ABDOMEN - 2 VIEW COMPARISON:  CT abdomen 05/13/2016 FINDINGS: Feeding tube tip in the proximal jejunum. Pigtail drainage catheter in the epigastric region in the region of the pancreas. Mild to moderate amount of stool in the colon. Negative for bowel obstruction. No free air on the decubitus view. IMPRESSION: Mild moderate stool in the colon without bowel obstruction or perforation Feeding tube tip in the jejunum Pigtail drainage catheter in the pancreas region. Electronically Signed   By: Marlan Palau M.D.   On: 05/15/2016 10:11   Ct Image Guided Drainage By  Percutaneous Catheter  Result Date: 05/13/2016 CLINICAL DATA:  Severe pancreatitis with infected necrosis of the entire pancreas and formation of infected pseudocyst. Referral for percutaneous drainage of infected pseudocyst given high risk currently for surgical debridement. EXAM: CT GUIDED CATHETER DRAINAGE OF RETROPERITONEAL PANCREATIC ABSCESS ANESTHESIA/SEDATION: 1.0 mg IV Versed 25 mcg IV Fentanyl Total Moderate Sedation Time:  25 minutes The patient's level of consciousness and physiologic status were continuously monitored during the procedure by Radiology nursing. PROCEDURE: The procedure, risks, benefits, and alternatives were explained to the patient. Questions regarding the procedure were encouraged and answered. The patient understands and consents to the procedure. A time out was performed prior to initiating the procedure. The left lateral abdominal wall was prepped with chlorhexidine in a sterile fashion, and a sterile drape was applied covering the operative field. A sterile gown and sterile gloves were used for the procedure. Local anesthesia was provided with 1% Lidocaine. CT was performed. After localizing a far left lateral retroperitoneal approach to the pancreas, an 18 gauge trocar needle was advanced under CT fluoroscopic guidance to the level of the infected pancreatic pseudocyst. After confirming needle tip position, a guidewire was advanced. The tract was dilated and a 14 French pigtail drainage catheter advanced. The catheter was formed and aspirated. A fluid sample was sent for culture analysis. The catheter was secured at the skin with a Prolene retention suture and StatLock device. It was connected to suction bulb drainage.  COMPLICATIONS: None FINDINGS: Drainage catheter course was along the rough course of the tail of the pancreas with the drain terminating in the region of the body of the pancreas. The necrotic pancreas and infected pseudocyst yielded blood tinged, purulent fluid  with debris. A sample was sent for culture analysis. IMPRESSION: CT-guided drainage of large region of pancreatic necrosis and infected pseudocyst formation. A 14 French percutaneous drain was placed from a left lateral retroperitoneal approach. The drain was attached to suction bulb drainage. A fluid sample was sent for culture analysis. Electronically Signed   By: Irish Lack M.D.   On: 05/13/2016 16:55    Microbiology: Recent Results (from the past 240 hour(s))  Aerobic/Anaerobic Culture (surgical/deep wound)     Status: None   Collection Time: 05/13/16  1:57 PM  Result Value Ref Range Status   Specimen Description ABSCESS  Final   Special Requests PANCREATIC PSEUDOCYST  Final   Gram Stain   Final    ABUNDANT WBC PRESENT,BOTH PMN AND MONONUCLEAR NO ORGANISMS SEEN    Culture FEW ESCHERICHIA COLI NO ANAEROBES ISOLATED   Final   Report Status 05/18/2016 FINAL  Final   Organism ID, Bacteria ESCHERICHIA COLI  Final      Susceptibility   Escherichia coli - MIC*    AMPICILLIN >=32 RESISTANT Resistant     CEFAZOLIN 8 SENSITIVE Sensitive     CEFEPIME <=1 SENSITIVE Sensitive     CEFTAZIDIME <=1 SENSITIVE Sensitive     CEFTRIAXONE <=1 SENSITIVE Sensitive     CIPROFLOXACIN >=4 RESISTANT Resistant     GENTAMICIN <=1 SENSITIVE Sensitive     IMIPENEM 0.5 SENSITIVE Sensitive     TRIMETH/SULFA >=320 RESISTANT Resistant     AMPICILLIN/SULBACTAM >=32 RESISTANT Resistant     PIP/TAZO 8 SENSITIVE Sensitive     Extended ESBL NEGATIVE Sensitive     * FEW ESCHERICHIA COLI     Labs: CBC:  Recent Labs Lab 05/16/16 0248 05/17/16 1002 05/19/16 0300 05/20/16 0202 05/21/16 1238  WBC 16.6* 14.2* 12.2* 13.7* 15.9*  HGB 10.3* 9.8* 10.0* 10.1* 10.6*  HCT 31.2* 29.9* 30.3* 30.8* 32.3*  MCV 94.8 93.7 93.5 92.8 93.6  PLT 245 259 216 222 252   Basic Metabolic Panel:  Recent Labs Lab 05/15/16 0600 05/16/16 0248 05/19/16 0300 05/20/16 0202  NA 131* 134* 134* 133*  K 4.0 3.9 3.5 3.5  CL  100* 98* 100* 101  CO2 26 30 28 26   GLUCOSE 158* 154* 123* 101*  BUN 18 17 14 11   CREATININE 0.63 0.76 0.61 0.62  CALCIUM 8.0* 8.1* 7.8* 7.8*  MG 1.9  --   --   --   PHOS 2.9  --   --   --    Liver Function Tests:  Recent Labs Lab 05/15/16 0600 05/16/16 0248  AST 25 33  ALT 23 25  ALKPHOS 209* 193*  BILITOT 0.6 0.5  PROT 5.1* 5.1*  ALBUMIN 1.4* 1.4*   BNP (last 3 results)  Recent Labs  04/05/16 0647  BNP 170.5*   CBG:  Recent Labs Lab 05/20/16 2043 05/21/16 0008 05/21/16 0409 05/21/16 0751 05/21/16 1217  GLUCAP 153* 130* 120* 174* 117*   Time spent: 30 minutes  Signed:  Cambria Osten  [facility]  05/21/2016  , 3:22 PM

## 2016-05-21 NOTE — Progress Notes (Signed)
Physical Therapy Treatment Patient Details Name: Colin West N Paden MRN: [id number] DOB: 1933/12/08 Today's Date: 05/21/2016    History of Present Illness Pt readmitted with pancreatic abcessess. Pt with 2 recent admissions for hemorrhagic gallstone pancreatitis and sepsis. PMH - HTN, DM    PT Comments    Pt admitted with above diagnosis. Pt currently with functional limitations due to balance and endurance deficits. Pt was able to ambulate with RW however needed mod assist at times due to cues and assist for RW safety.  Continues to need SNF for therapy as pt has poor balance and poor endurance.   Pt will benefit from skilled PT to increase their independence and safety with mobility to allow discharge to the venue listed below.    Follow Up Recommendations  SNF     Equipment Recommendations  Other (comment) (TBA)    Recommendations for Other Services       Precautions / Restrictions Precautions Precautions: Fall Precaution Comments: JP drain left side, NG tube Restrictions Weight Bearing Restrictions: No    Mobility  Bed Mobility Overal bed mobility: Needs Assistance Bed Mobility: Supine to Sit     Supine to sit: Min assist;HOB elevated     General bed mobility comments: Assist to elevate trunk into sitting  Transfers Overall transfer level: Needs assistance Equipment used: Rolling walker (2 wheeled) Transfers: Sit to/from Stand Sit to Stand: Min assist         General transfer comment: Assist to bring hips up and for balance once up due to pt with posterior lean and would have sat back down if PT had not steadied him.    Ambulation/Gait Ambulation/Gait assistance: Min assist;Mod assist Ambulation Distance (Feet): 110 Feet Assistive device: Rolling walker (2 wheeled) Gait Pattern/deviations: Step-through pattern;Trunk flexed;Wide base of support;Drifts right/left Gait velocity: decr Gait velocity interpretation: Below normal speed for age/gender General Gait  Details: Assist for balance and support. Verbal cues to stand more erect and look up as pt with flexed posture throughout that worsened as pt fatigued.  Pt needed assist to guide RW as well as to sequence steps and RW.   Also needed cues and assist to stay close to RW.  At times mod assist due to unsteady gait and heavy cuing. Pt needed incr assist and cues to back up to chair as well and to get legs against chair prior to sitting down.    Stairs            Wheelchair Mobility    Modified Rankin (Stroke Patients Only)       Balance Overall balance assessment: Needs assistance;History of Falls Sitting-balance support: No upper extremity supported;Feet supported Sitting balance-Leahy Scale: Fair Sitting balance - Comments: pt required at least one UE support to sit EOB Postural control: Posterior lean Standing balance support: Bilateral upper extremity supported;During functional activity Standing balance-Leahy Scale: Poor Standing balance comment: Walker and min assisst for static stance with pt with posterior lean.             High level balance activites: Direction changes;Turns;Sudden stops High Level Balance Comments: min to mod assist for challenges to balance and incr assist to for walker management.     Cognition Arousal/Alertness: Awake/alert Behavior During Therapy: WFL for tasks assessed/performed;Flat affect Overall Cognitive Status: Impaired/Different from baseline Area of Impairment: Following commands;Safety/judgement;Problem solving       Following Commands: Follows one step commands with increased time Safety/Judgement: Decreased awareness of safety;Decreased awareness of deficits   Problem Solving: Difficulty sequencing;Requires  verbal cues;Requires tactile cues      Exercises General Exercises - Lower Extremity Ankle Circles/Pumps: AROM;Both;10 reps;Supine Quad Sets: AROM;Both;10 reps;Supine Long Arc Quad: AROM;Both;5 reps;Seated    General  Comments        Pertinent Vitals/Pain Pain Assessment: Faces Faces Pain Scale: Hurts little more Pain Location: generalized Pain Descriptors / Indicators: Aching;Sore Pain Intervention(s): Limited activity within patient's tolerance;Monitored during session;Repositioned;Premedicated before session  VSS    Home Living                      Prior Function            PT Goals (current goals can now be found in the care plan section) Acute Rehab PT Goals Patient Stated Goal: get better Progress towards PT goals: Progressing toward goals    Frequency    Min 2X/week      PT Plan Current plan remains appropriate    Co-evaluation             End of Session Equipment Utilized During Treatment: Gait belt Activity Tolerance: Patient limited by fatigue Patient left: in chair;with call bell/phone within reach;with nursing/sitter in room     Time: [id number] PT Time Calculation (min) (ACUTE ONLY): 24 min  Charges:  $Gait Training: 8-22 mins $Therapeutic Exercise: 8-22 mins                    G Codes:      Amadeo GarnetDawn F Tyric Rodeheaver 05/21/2016, 12:01 PM Entergy CorporationDawn Lilyan Prete,PT Acute Rehabilitation [phone] (pager)

## 2016-05-21 NOTE — Significant Event (Addendum)
Called by patient's RN Marissa to unclog existing cortrak tube. Patient's RN stated they have tried other alternatives to unclog existing tube. RN attempted to unclog using stylus. Was able to unclog the top half of the tube, unable to unclog the bottom of the tube. RN removed tube and placed a new tube at the requests of patient' team. RN replaced via left nare-marked at 109cm at the nare-secured with pink tapes. Patient tolerated procedure. KUB ordered.

## 2016-05-21 NOTE — Progress Notes (Addendum)
LCSW following for disposition of needs and potential placement in SNF.    Barrier at this time for SNF:   NG Tube.  Will need understanding of long term feeding and if possible and wanting to go [facility]: [facility]. SNFs are unlikely to accept patient due to risk of the NG TUBE and most have asked if possible to convert to PEG placement. LCSW can staff with medical director and CM regarding other options if necessary. Also discussed with CM possibly LTACH referral due to NG Tube. (still assessing and will follow up with disposition to next level of care)  [facility] is prepared to take him once feeding issues are resolved and determined. [facility] updated on patient condition and have also been in contact with patient's ex-wife who is involved.  Will continue to follow.   CM was updated. Patient will be skilled due to his feeding/speech and IV antibiotics rather than PT due to his ability to ambulate more than 400 feet.   Deretha EmoryHannah Marthann Abshier LCSW, MSW Clinical Social Work: Optician, dispensingystem [employer] Coverage for :  [phone]

## 2016-05-21 NOTE — Progress Notes (Signed)
Patient ID: Colin West, male   DOB: 1933-12-16, 82 y.o.   MRN: [id number]  [facility]     Subjective: Feeling a little better today than yesterday.  BM yesterday. Nurse concerned that TF is clogged, tried to flush with NS but it is not working.  Objective: Vital signs in last 24 hours: Temp:  [97.8 F (36.6 C)-98.6 F (37 C)] 98.6 F (37 C) (12/06 0549) Pulse Rate:  [79-108] 85 (12/06 0900) Resp:  [16-19] 16 (12/06 0549) BP: (96-146)/(59-78) 144/73 (12/06 0900) SpO2:  [95 %-98 %] 98 % (12/06 0900) Weight:  [173 lb (78.5 kg)] 173 lb (78.5 kg) (12/06 0453) Last BM Date: 05/20/16  Intake/Output from previous day: 12/05 0701 - 12/06 0700 In: 1882.2 [I.V.:189; NW/GN:5621.3G/GT:1363.2; IV Piggyback:300] Out: 1817 [Urine:1750; Drains:67] Intake/Output this shift: No intake/output data recorded.  PE: Gen: Alert, NAD, cooperative Card: RRR, no M/G/R heard Pulm: CTAB, no wheezes, few scattered rhonchi, normal effort Abd: Soft, mildly distended,+BS, mild epigastric TTP, drain with thick/grey drainage  Lab Results:   Recent Labs  05/19/16 0300 05/20/16 0202  WBC 12.2* 13.7*  HGB 10.0* 10.1*  HCT 30.3* 30.8*  PLT 216 222   BMET  Recent Labs  05/19/16 0300 05/20/16 0202  NA 134* 133*  K 3.5 3.5  CL 100* 101  CO2 28 26  GLUCOSE 123* 101*  BUN 14 11  CREATININE 0.61 0.62  CALCIUM 7.8* 7.8*   PT/INR No results for input(s): LABPROT, INR in the last 72 hours. CMP     Component Value Date/Time   NA 133 (L) 05/20/2016 0202   NA 136 (A) 05/01/2016   K 3.5 05/20/2016 0202   CL 101 05/20/2016 0202   CO2 26 05/20/2016 0202   GLUCOSE 101 (H) 05/20/2016 0202   BUN 11 05/20/2016 0202   BUN 23 (A) 05/01/2016   CREATININE 0.62 05/20/2016 0202   CALCIUM 7.8 (L) 05/20/2016 0202   PROT 5.1 (L) 05/16/2016 0248   ALBUMIN 1.4 (L) 05/16/2016 0248   AST 33 05/16/2016 0248   ALT 25 05/16/2016 0248   ALKPHOS 193 (H) 05/16/2016 0248   BILITOT 0.5  05/16/2016 0248   GFRNONAA >60 05/20/2016 0202   GFRAA >60 05/20/2016 0202   Lipase     Component Value Date/Time   LIPASE 18 05/10/2016 0229       Studies/Results: Ct Abdomen Pelvis W Contrast  Result Date: 05/20/2016 CLINICAL DATA:  Pancreatitis, evaluate for healing of pancreatic abscess. EXAM: CT ABDOMEN AND PELVIS WITH CONTRAST TECHNIQUE: Multidetector CT imaging of the abdomen and pelvis was performed using the standard protocol following bolus administration of intravenous contrast. CONTRAST:  100mL ISOVUE-300 IOPAMIDOL (ISOVUE-300) INJECTION 61% COMPARISON:  May 13, 2016, May 09, 2016 FINDINGS: Lower chest: There is a small right pleural effusion and small to moderate left pleural effusion. Mild dependent atelectasis of posterior lung bases are noted. The heart size is normal. Hepatobiliary: There is diffuse fatty infiltration of liver. No focal liver lesion is identified. The gallbladder is normal. Pancreas: Pancreatic necrosis with multi focal abscesses are identified throughout the pancreas with a drainage catheter in place. The fluid volume within the multi focal abscesses are slightly smaller compared to prior exam. Spleen: Normal in size without focal abnormality. Adrenals/Urinary Tract: Stable left adrenal enlargement is unchanged. The right adrenal gland is normal. Right nephrolithiasis is noted. Small cysts are identified in both kidneys unchanged. There is no hydronephrosis. Stomach/Bowel: A feeding tube is identified with distal tip in  the proximal jejunum. There are mild thickened walled proximal small bowel loops likely due to inflammation from adjacent pancreatitis. There is no small bowel obstruction. There is diverticulosis of colon without diverticulitis. Vascular/Lymphatic: Aortic atherosclerosis. No enlarged abdominal or pelvic lymph nodes. Reproductive: Prostate calcifications are identified. Other: Ascites is identified in the abdomen pelvis not significantly  changed. Musculoskeletal: Extensive degenerative joint changes of the spine are noted. IMPRESSION: Pancreatic necrosis with multi focal abscesses are identified throughout the pancreas with a drainage catheter in place. The fluid volume within the multi focal abscesses are slightly smaller compared to prior exam. Persistent ascites. Mild thickened walled proximal small bowel loops likely due to inflammation from adjacent pancreatitis. There is no evidence of small bowel obstruction. Electronically Signed   By: Sherian Rein M.D.   On: 05/20/2016 07:29    Anti-infectives: Anti-infectives    Start     Dose/Rate Route Frequency Ordered Stop   05/12/16 0000  vancomycin (VANCOCIN) IVPB 1000 mg/200 mL premix  Status:  Discontinued     1,000 mg 200 mL/hr over 60 Minutes Intravenous Every 12 hours 05/11/16 1322 05/15/16 1503   05/11/16 1315  vancomycin (VANCOCIN) 500 mg in sodium chloride 0.9 % 100 mL IVPB     500 mg 100 mL/hr over 60 Minutes Intravenous STAT 05/11/16 1304 05/11/16 1558   05/10/16 1200  imipenem-cilastatin (PRIMAXIN) 500 mg in sodium chloride 0.9 % 100 mL IVPB     500 mg 200 mL/hr over 30 Minutes Intravenous Every 6 hours 05/10/16 0846     05/09/16 1200  cefTRIAXone (ROCEPHIN) 2 g in dextrose 5 % 50 mL IVPB  Status:  Discontinued     2 g 100 mL/hr over 30 Minutes Intravenous Every 24 hours 05/09/16 0801 05/09/16 1050   05/09/16 1200  cefTRIAXone (ROCEPHIN) 1 g in dextrose 5 % 50 mL IVPB  Status:  Discontinued     1 g 100 mL/hr over 30 Minutes Intravenous Every 24 hours 05/09/16 1050 05/10/16 0833   05/09/16 1100  vancomycin (VANCOCIN) IVPB 750 mg/150 ml premix  Status:  Discontinued     750 mg 150 mL/hr over 60 Minutes Intravenous Every 12 hours 05/09/16 1051 05/11/16 1322   05/05/16 1230  fluconazole (DIFLUCAN) IVPB 100 mg  Status:  Discontinued     100 mg 50 mL/hr over 60 Minutes Intravenous Every 24 hours 05/05/16 1135 05/15/16 1503   05/03/16 1200  imipenem-cilastatin  (PRIMAXIN) 500 mg in sodium chloride 0.9 % 100 mL IVPB  Status:  Discontinued     500 mg 200 mL/hr over 30 Minutes Intravenous Every 6 hours 05/03/16 1122 05/09/16 0801   05/03/16 0030  piperacillin-tazobactam (ZOSYN) IVPB 3.375 g  Status:  Discontinued     3.375 g 12.5 mL/hr over 240 Minutes Intravenous Every 8 hours 05/02/16 1647 05/03/16 1101   05/02/16 1930  fluconazole (DIFLUCAN) tablet 100 mg  Status:  Discontinued     100 mg Oral Daily 05/02/16 1816 05/05/16 1135   05/02/16 1600  piperacillin-tazobactam (ZOSYN) IVPB 3.375 g     3.375 g 100 mL/hr over 30 Minutes Intravenous  Once 05/02/16 1548 05/02/16 1720       Assessment/Plan Gallstone pancreatitiswith infected pancreatic necrosis - CT scan 11/24 showed multiple peripancreatic abscesses, was recently in the hospital from 10/8-10/12 for hemorrhagic pancreatitis from gallbladder source - s/p pancreatic drainage per IR 11/28 with grey drainage. Culture growing E coli - Repeat CT scan 12/5 with persistent multifocal abscess and pancreatic necrosis, looking somewhat improved -  Drain 67cc/24hr gray/thick fluid - Surgery via rp approach is still option moving forward as well  Constipation: - continue bowel regiment   Cough /probable HCAP:per primary, cough has improved - Suspect developing pna chest x-ray shows left basilar atelectasis versus pneumonia - Continue imipenem  - pulmonary toilet  Protein calorie malnutrition, severe  - CORTRAK in place, continue tube feeds. If unable to flush let me know and I'll contact [facility] to eval  ID: Primaxin 11/25>> FEN: Postpyloric TF's at goal 2770mL/hr, bowel regiment VTE: SCD's  Plan: CBC pending. Continue drain and antibiotics. NPO & TF's (if unable to unclog let me know and I'll contact Cort[facility]). Ambulate. Looking into rehab.   LOS: 19 days    Edson SnowballBROOKE A MILLER , [facility] 05/21/2016, 10:24 AM Pager: [phone] Consults: [phone] Mon-Fri 7:00  am-4:30 pm Sat-Sun 7:00 am-11:30 am

## 2016-05-21 NOTE — Care Management Note (Addendum)
Case Management Note  Patient Details  Name: Farris HasGraham N Fetters MRN: [id number] Date of Birth: 08-05-1933  Subjective/Objective:                    Action/Plan:  [facility] room number 5e09 , Cathy aware   Lynden AngCathy would like [facility] . Patient in agreement . Accepting doctor will be Dr Sharyon MedicusHijazi . After Discharge summary completed bedside nurse to call report to [phone]. Corrina with [facility] will call back with room number . [facility] for [facility] , CCS and Dr Allena KatzPatel in agreement. Offered choice to patient , he asked CM to call his wife Nickie RetortCathy Paras [phone] and ask her which one.     Spoke with Lynden AngCathy , she explained she is his ex wife but they are still friends and she is a Teacher, musicclinical pharmacist so Mr Etheleen MayhewFuquay likes her to be involved. Cathy requesting [facility]CS call her before she makes her decision. Brooke [employer] ProductsMiller called Cathy. Lynden AngCathy was agreeable to LTAC . CM called Cathy back for choice . Await call back.    Explained LTACH to patient and multiple visitors . Patient in agreement to refer to [facility] . Same done Expected Discharge Date:                  Expected Discharge Plan:  Long Term Acute Care (LTAC)  In-House Referral:     Discharge planning Services  CM Consult  Post Acute Care Choice:  Long Term Acute Care (LTAC) Choice offered to:  Patient  DME Arranged:    DME Agency:     HH Arranged:    HH Agency:     Status of Service:  In process, will continue to follow  If discussed at Long Length of Stay Meetings, dates discussed:    Additional Comments:  Kingsley PlanWile, Ragen Laver Marie, RN 05/21/2016, 12:09 PM

## 2016-05-21 NOTE — Progress Notes (Addendum)
1000 Unable to flush Cortrak tube this morning. Tried unclogging protocol with no success. [facility] RN notified. [facility] PA Evangeline GulaBrooke Miller aware.  New Cortrak tube was placed. ABD XR was done and completed. OK to resume tube feeding per [facility] RN, Hyui. Pt with discharge order to go [facility].  Pt and wife was aware and agreed.  Discharge papers was completed, EMTALA form completed. [facility].  1910 Discharged pt to [facility] via bed. Pt's family was aware of the discharge.

## 2016-05-22 LAB — COMPREHENSIVE METABOLIC PANEL
ALBUMIN: 1.6 g/dL — AB (ref 3.5–5.0)
ALT: 28 U/L (ref 17–63)
ANION GAP: 4 — AB (ref 5–15)
AST: 26 U/L (ref 15–41)
Alkaline Phosphatase: 147 U/L — ABNORMAL HIGH (ref 38–126)
BUN: 15 mg/dL (ref 6–20)
CHLORIDE: 102 mmol/L (ref 101–111)
CO2: 30 mmol/L (ref 22–32)
Calcium: 7.8 mg/dL — ABNORMAL LOW (ref 8.9–10.3)
Creatinine, Ser: 0.69 mg/dL (ref 0.61–1.24)
GFR calc Af Amer: >60 mL/min (ref >60)
Glucose, Bld: 141 mg/dL — ABNORMAL HIGH (ref 65–99)
POTASSIUM: 3.2 mmol/L — AB (ref 3.5–5.1)
Sodium: 136 mmol/L (ref 135–145)
TOTAL PROTEIN: 5.5 g/dL — AB (ref 6.5–8.1)
Total Bilirubin: 1 mg/dL (ref 0.3–1.2)

## 2016-05-22 LAB — CBC WITH DIFFERENTIAL/PLATELET
BASOS ABS: 0.1 10*3/uL (ref 0.0–0.1)
Basophils Relative: 1 %
EOS PCT: 4 %
Eosinophils Absolute: 0.4 10*3/uL (ref 0.0–0.7)
HEMATOCRIT: 30.8 % — AB (ref 39.0–52.0)
HEMOGLOBIN: 10.1 g/dL — AB (ref 13.0–17.0)
LYMPHS ABS: 1.7 10*3/uL (ref 0.7–4.0)
LYMPHS PCT: 17 %
MCH: 30.8 pg (ref 26.0–34.0)
MCHC: 32.8 g/dL (ref 30.0–36.0)
MCV: 93.9 fL (ref 78.0–100.0)
Monocytes Absolute: 1 10*3/uL (ref 0.1–1.0)
Monocytes Relative: 10 %
NEUTROS ABS: 7.2 10*3/uL (ref 1.7–7.7)
NEUTROS PCT: 68 %
Platelets: 236 10*3/uL (ref 150–400)
RBC: 3.28 MIL/uL — AB (ref 4.22–5.81)
RDW: 14.7 % (ref 11.5–15.5)
WBC: 10.3 10*3/uL (ref 4.0–10.5)

## 2016-05-24 LAB — CBC
HCT: 31 % — ABNORMAL LOW (ref 39.0–52.0)
Hemoglobin: 10.1 g/dL — ABNORMAL LOW (ref 13.0–17.0)
MCH: 30.5 pg (ref 26.0–34.0)
MCHC: 32.6 g/dL (ref 30.0–36.0)
MCV: 93.7 fL (ref 78.0–100.0)
PLATELETS: 264 10*3/uL (ref 150–400)
RBC: 3.31 MIL/uL — AB (ref 4.22–5.81)
RDW: 14.8 % (ref 11.5–15.5)
WBC: 11 10*3/uL — AB (ref 4.0–10.5)

## 2016-05-24 LAB — BASIC METABOLIC PANEL
ANION GAP: 6 (ref 5–15)
BUN: 13 mg/dL (ref 6–20)
CALCIUM: 8 mg/dL — AB (ref 8.9–10.3)
CO2: 32 mmol/L (ref 22–32)
Chloride: 100 mmol/L — ABNORMAL LOW (ref 101–111)
Creatinine, Ser: 0.64 mg/dL (ref 0.61–1.24)
GLUCOSE: 136 mg/dL — AB (ref 65–99)
POTASSIUM: 3.2 mmol/L — AB (ref 3.5–5.1)
SODIUM: 138 mmol/L (ref 135–145)

## 2016-05-26 ENCOUNTER — Other Ambulatory Visit (HOSPITAL_COMMUNITY): Payer: Medicare Other

## 2016-05-27 NOTE — Progress Notes (Signed)
Patient ID: Colin West, male   DOB: 1933/11/26, 82 y.o.   MRN: [id number]   Request for drain evaluation Infected pseudocyst drain placed 11/28 in IR  Transfer to [facility] Output of drain is milky brown Copious; odorous RN noted leakage into bandage this am.  I have flushed drain with 8 cc sterile saline x 2 Aspirated  No leakage at drain site No tubing compromise Pt denies pain  Redressed  Discussed with RN importance to flush drain 3 x /day especially due to thickness of output. This may be playing role in "?leakage" into bandage.  Call us if need us

## 2016-05-29 LAB — CBC
HEMATOCRIT: 31.5 % — AB (ref 39.0–52.0)
Hemoglobin: 10.2 g/dL — ABNORMAL LOW (ref 13.0–17.0)
MCH: 30.5 pg (ref 26.0–34.0)
MCHC: 32.4 g/dL (ref 30.0–36.0)
MCV: 94.3 fL (ref 78.0–100.0)
PLATELETS: 277 10*3/uL (ref 150–400)
RBC: 3.34 MIL/uL — ABNORMAL LOW (ref 4.22–5.81)
RDW: 14.8 % (ref 11.5–15.5)
WBC: 10.6 10*3/uL — AB (ref 4.0–10.5)

## 2016-05-29 LAB — COMPREHENSIVE METABOLIC PANEL
ALBUMIN: 1.7 g/dL — AB (ref 3.5–5.0)
ALT: 42 U/L (ref 17–63)
AST: 34 U/L (ref 15–41)
Alkaline Phosphatase: 104 U/L (ref 38–126)
Anion gap: 6 (ref 5–15)
BILIRUBIN TOTAL: 0.5 mg/dL (ref 0.3–1.2)
BUN: 15 mg/dL (ref 6–20)
CHLORIDE: 98 mmol/L — AB (ref 101–111)
CO2: 31 mmol/L (ref 22–32)
CREATININE: 0.68 mg/dL (ref 0.61–1.24)
Calcium: 8.1 mg/dL — ABNORMAL LOW (ref 8.9–10.3)
GFR calc Af Amer: >60 mL/min (ref >60)
GFR calc non Af Amer: >60 mL/min (ref >60)
GLUCOSE: 181 mg/dL — AB (ref 65–99)
POTASSIUM: 3.5 mmol/L (ref 3.5–5.1)
Sodium: 135 mmol/L (ref 135–145)
Total Protein: 5.8 g/dL — ABNORMAL LOW (ref 6.5–8.1)

## 2016-05-29 LAB — LIPASE, BLOOD: LIPASE: 14 U/L (ref 11–51)

## 2016-05-30 ENCOUNTER — Other Ambulatory Visit (HOSPITAL_COMMUNITY): Payer: Medicare Other

## 2016-05-30 MED ORDER — IOPAMIDOL (ISOVUE-300) INJECTION 61%
INTRAVENOUS | Status: AC
  Administered 2016-05-30: 20 mL
  Filled 2016-05-30: qty 50

## 2016-05-30 MED ORDER — LIDOCAINE VISCOUS 2 % MT SOLN
OROMUCOSAL | Status: AC
  Administered 2016-05-30: 5 mL via OROMUCOSAL
  Filled 2016-05-30: qty 15

## 2016-05-30 MED ORDER — IOPAMIDOL (ISOVUE-300) INJECTION 61%
50.0000 mL | Freq: Once | INTRAVENOUS | Status: AC | PRN
  Administered 2016-05-30: 20 mL

## 2016-05-30 MED ORDER — LIDOCAINE VISCOUS 2 % MT SOLN
15.0000 mL | Freq: Once | OROMUCOSAL | Status: AC
  Administered 2016-05-30: 5 mL via OROMUCOSAL

## 2016-06-02 ENCOUNTER — Other Ambulatory Visit (HOSPITAL_COMMUNITY): Payer: Medicare Other

## 2016-06-02 ENCOUNTER — Encounter: Payer: Self-pay | Admitting: Radiology

## 2016-06-02 MED ORDER — IOPAMIDOL (ISOVUE-300) INJECTION 61%
100.0000 mL | Freq: Once | INTRAVENOUS | Status: AC | PRN
  Administered 2016-06-02: 100 mL via INTRAVENOUS

## 2016-06-03 NOTE — Consult Note (Signed)
Chief Complaint: Patient was seen in consultation today for retrievable inferior vena cava filter placement at the request of Dr Ardeth Sportsman  Referring Physician(s): Dr Ardeth Sportsman  Supervising Physician: Ruel Favors  Patient Status: [facility] - In-pt  History of Present Illness: Colin West is a 82 y.o. male   Pt with Hx HTN; DM; HLD Hemorrhagic pancreatitis 03/2016 Sepsis secondary infected pancreatic pseudocyst Drain placed 05/13/2016  Recent CT scan of Abd /Pel incidentally found BLE DVT Filling defects in bilateral common femoral veins consistent with DVT.  No noted swelling in legs Denies pain  Cannot anticoagulate secondary recent hemorrhagic pancreatitis Very little mobilization Request for retrievable inferior vena cava filter placement  Dr Miles Costain to evaluate imaging Possibly will need doppler study of B LE Tentatively scheduled for filter 12/20 in IR   Past Medical History:  Diagnosis Date  . Anxiety   . Carotid artery stenosis    a. < 40% bilateral stenosis by [place] in 2014  . Depression    denies  . Diabetes mellitus    denies  . History of kidney stones   . Hyperlipidemia   . Hypertension     Past Surgical History:  Procedure Laterality Date  . ELBOW ARTHROSCOPY Right   . INGUINAL HERNIA REPAIR Left Sept 2006   Dr Corliss Skains  . INGUINAL HERNIA REPAIR Bilateral 06/01/2013   Procedure: LAPAROSCOPIC EXPLORATION BILATERAL INGUINAL HERNIA REPAIR;  Surgeon: Ardeth Sportsman, MD;  Location: [facility] OR;  Service: General;  Laterality: Bilateral;  Left Femoral, Left Recurrent inguinal, Right Inguinal  . INSERTION OF MESH Bilateral 06/01/2013   Procedure: INSERTION OF MESH;  Surgeon: Ardeth Sportsman, MD;  Location: [facility] OR;  Service: General;  Laterality: Bilateral;  Left Femoral, Left Recurrent inguinal, Right Inguinal  . LAPAROSCOPIC LYSIS OF ADHESIONS Bilateral 06/01/2013   Procedure: LAPAROSCOPIC LYSIS OF ADHESIONS;  Surgeon: Ardeth Sportsman, MD;  Location: [facility] OR;   Service: General;  Laterality: Bilateral;  . LUNG REMOVAL, PARTIAL  Sept. 2011    Dr. Edwyna Shell  . ROTATOR CUFF REPAIR     Bilateral repair    Allergies: Patient has no known allergies.  Medications: Prior to Admission medications   Medication Sig Start Date End Date Taking? Authorizing Provider  chlorpheniramine-HYDROcodone (TUSSIONEX) 10-8 MG/5ML SUER Take 5 mLs by mouth every 12 (twelve) hours as needed for cough. 05/21/16   Rolly Salter, MD  docusate (COLACE) 50 MG/5ML liquid Place 5 mLs (50 mg total) into feeding tube daily. 05/22/16   Rolly Salter, MD  furosemide (LASIX) 40 MG tablet Take 1 tablet (40 mg total) by mouth daily. 05/21/16   Rolly Salter, MD  guaiFENesin-dextromethorphan (ROBITUSSIN DM) 100-10 MG/5ML syrup Take 5 mLs by mouth 4 (four) times daily. 05/21/16   Rolly Salter, MD  imipenem-cilastatin 500 mg in sodium chloride 0.9 % 100 mL Inject 500 mg into the vein every 6 (six) hours. 05/21/16   Rolly Salter, MD  metoCLOPramide (REGLAN) 5 MG tablet Take 5-10 mg by mouth every 8 (eight) hours as needed (for hiccups).    Historical Provider, MD  metoprolol tartrate (LOPRESSOR) 25 mg/10 mL SUSP Place 40 mLs (100 mg total) into feeding tube 2 (two) times daily. 05/21/16   Rolly Salter, MD  Nutritional Supplements (FEEDING SUPPLEMENT, JEVITY 1.2 CAL,) LIQD Place 1,000 mLs into feeding tube continuous. 05/21/16   Rolly Salter, MD  ondansetron (ZOFRAN) 4 MG tablet Take 4 mg by mouth every 8 (eight) hours  as needed for nausea or vomiting.    Historical Provider, MD  pantoprazole sodium (PROTONIX) 40 mg/20 mL PACK Place 20 mLs (40 mg total) into feeding tube 2 (two) times daily. 05/21/16   Rolly SalterPranav M Patel, MD  polyethylene glycol (MIRALAX / Ethelene HalGLYCOLAX) packet Take 17 g by mouth daily. 05/22/16   Rolly SalterPranav M Patel, MD     Family History  Problem Relation Age of Onset  . Heart disease Brother     Heart Disease before age 80  . Heart attack Brother     Social History   Social  History  . Marital status: Divorced    Spouse name: N/A  . Number of children: N/A  . Years of education: N/A   Social History Main Topics  . Smoking status: Former Smoker    Quit date: 05/31/1973  . Smokeless tobacco: Never Used  . Alcohol use No  . Drug use: No  . Sexual activity: Not Asked   Other Topics Concern  . None   Social History Narrative  . None    Review of Systems: A 12 point ROS discussed and pertinent positives are indicated in the HPI above.  All other systems are negative.  Review of Systems  Constitutional: Positive for activity change. Negative for appetite change, fatigue and fever.  Respiratory: Negative for shortness of breath.   Cardiovascular: Negative for chest pain.  Gastrointestinal: Negative for abdominal pain.  Musculoskeletal: Positive for gait problem.  Neurological: Positive for weakness.  Psychiatric/Behavioral: Negative for behavioral problems and confusion.    Vital Signs: There were no vitals taken for this visit.  Physical Exam  Cardiovascular: Normal rate.   Abdominal: Soft.  Skin: Skin is warm.  Psychiatric: He has a normal mood and affect. His behavior is normal. Thought content normal.  Nursing note and vitals reviewed.   Mallampati Score:  MD Evaluation Airway: WNL Heart: WNL Abdomen: WNL Chest/ Lungs: WNL ASA  Classification: 3 Mallampati/Airway Score: One  Imaging: Dg Chest 1 View  Result Date: 05/08/2016 CLINICAL DATA:  Coughing episode EXAM: CHEST 1 VIEW COMPARISON:  05/03/2016 FINDINGS: Semi-erect portable view chest. A right upper extremity catheter tip overlies the cavoatrial junction. Esophageal tube tip is not included but probably positioned over duodenum. Markedly low lung volumes. Probable tiny effusions. Mild bibasilar atelectasis. No focal consolidations. Stable cardiomediastinal silhouette. No pneumothorax. Postsurgical changes in the right lung apex. Old right-sided rib deformities. IMPRESSION: 1.  Markedly low lung volumes with suspected tiny effusions. 2. Mild bibasilar atelectasis. Electronically Signed   By: Jasmine PangKim  Fujinaga M.D.   On: 05/08/2016 16:33   Dg Abd 1 View  Result Date: 05/30/2016 CLINICAL DATA:  Feeding catheter placement EXAM: ABDOMEN - 1 VIEW COMPARISON:  None. FINDINGS: Feeding catheter was placed under fluoroscopic guidance and advanced to the level of the ligament of Treitz. Contrast material was then injected to confirm catheter position. 5 minutes of 48 seconds of fluoroscopy was utilized. IMPRESSION: Successful placement of feeding catheter into small bowel at the level of the ligament of Treitz. Electronically Signed   By: Alcide CleverMark  Lukens M.D.   On: 05/30/2016 09:21   Dg Abd 1 View  Result Date: 05/08/2016 CLINICAL DATA:  NG tube placement EXAM: ABDOMEN - 1 VIEW COMPARISON:  05/06/2016 FINDINGS: Motion artifact limits the examination. Esophageal tube tip overlies the second portion of the duodenum. Some gaseous dilatation of bowel could relate to a mild ileus. IMPRESSION: Esophageal tube tip overlies the second portion of the duodenum. Mild gaseous dilatation of  bowel could relate to a mild ileus. Electronically Signed   By: Jasmine Pang M.D.   On: 05/08/2016 16:35   Ct Abdomen Pelvis W Contrast  Result Date: 06/02/2016 CLINICAL DATA:  Pancreatitis No oral, IV contrast only per MD Hx of DM, hernia repair EXAM: CT ABDOMEN AND PELVIS WITH CONTRAST TECHNIQUE: Multidetector CT imaging of the abdomen and pelvis was performed using the standard protocol following bolus administration of intravenous contrast. CONTRAST:  ISOVUE-300 IOPAMIDOL (ISOVUE-300) INJECTION 61% COMPARISON:  05/20/2016 FINDINGS: Lower chest: Small left pleural effusion, decreased since previous exam. The small right pleural effusion seen previously has resolved. There is some dependent atelectasis posteriorly in the visualized lung bases. Coronary and aortic calcifications. Hepatobiliary: No focal liver  abnormality is seen. No gallstones, gallbladder wall thickening, or biliary dilatation. Pancreas: Diffuse parenchymal atrophy. Extensive peripancreatic loculated fluid collections ; those anterior to the pancreatic body contain loculated gas bubbles as before. There has been no significant interval change in size of the collections since prior study. A percutaneous pigtail drain catheter is in stable position anterior to the pancreatic tail and body. Mild regional inflammatory/ edematous changes stable. Spleen: Normal in size without focal abnormality. Adrenals/Urinary Tract: Normal adrenal glands. Subcentimeter probable cysts in both kidneys. No solid renal mass or hydronephrosis. Hyperdense foci in the dependent aspect of the incompletely distended urinary bladder suggesting small bladder calculi. Stomach/Bowel: Stomach is nondilated. A feeding tube extends to the ligament of Treitz. Small bowel is nondistended. Normal appendix. The colon is nondilated. Scattered diverticula most numerous in the descending and sigmoid segments of the colon. Mild fecal distention of the rectum. Vascular/Lymphatic: Coarse aortoiliac arterial calcifications without aneurysm or stenosis. There is narrowing at the portal splenic confluence without definite occlusion. Intrahepatic portal vein branches appear normal. Filling defects in bilateral common femoral veins consistent with DVT. Reproductive: Moderate prostatic enlargement with central coarse calcifications. Other: Trace residual perihepatic ascites.  No free air. Musculoskeletal: Extensive spondylitic changes in the visualized lower thoracic and lumbar spine. No fracture or worrisome osseous lesion. IMPRESSION: 1. Bilateral lower extremity DVT. These results will be called to the ordering clinician or representative by the Radiologist Assistant, and communication documented in the PACS or zVision Dashboard. 2. Little interval change in numerous peripancreatic pseudocysts, with  stable position of pigtail drain catheter. 3. Improvement in ascites and pleural effusions, with a small residual pleural effusion on the left, and minimal residual perihepatic ascites. 4. Coronary and aortoiliac atheromatous calcified plaque. Electronically Signed   By: Corlis Leak M.D.   On: 06/02/2016 17:22   Ct Abdomen Pelvis W Contrast  Result Date: 05/20/2016 CLINICAL DATA:  Pancreatitis, evaluate for healing of pancreatic abscess. EXAM: CT ABDOMEN AND PELVIS WITH CONTRAST TECHNIQUE: Multidetector CT imaging of the abdomen and pelvis was performed using the standard protocol following bolus administration of intravenous contrast. CONTRAST:  ISOVUE-300 IOPAMIDOL (ISOVUE-300) INJECTION 61% COMPARISON:  May 13, 2016, May 09, 2016 FINDINGS: Lower chest: There is a small right pleural effusion and small to moderate left pleural effusion. Mild dependent atelectasis of posterior lung bases are noted. The heart size is normal. Hepatobiliary: There is diffuse fatty infiltration of liver. No focal liver lesion is identified. The gallbladder is normal. Pancreas: Pancreatic necrosis with multi focal abscesses are identified throughout the pancreas with a drainage catheter in place. The fluid volume within the multi focal abscesses are slightly smaller compared to prior exam. Spleen: Normal in size without focal abnormality. Adrenals/Urinary Tract: Stable left adrenal enlargement is unchanged.  The right adrenal gland is normal. Right nephrolithiasis is noted. Small cysts are identified in both kidneys unchanged. There is no hydronephrosis. Stomach/Bowel: A feeding tube is identified with distal tip in the proximal jejunum. There are mild thickened walled proximal small bowel loops likely due to inflammation from adjacent pancreatitis. There is no small bowel obstruction. There is diverticulosis of colon without diverticulitis. Vascular/Lymphatic: Aortic atherosclerosis. No enlarged abdominal or pelvic  lymph nodes. Reproductive: Prostate calcifications are identified. Other: Ascites is identified in the abdomen pelvis not significantly changed. Musculoskeletal: Extensive degenerative joint changes of the spine are noted. IMPRESSION: Pancreatic necrosis with multi focal abscesses are identified throughout the pancreas with a drainage catheter in place. The fluid volume within the multi focal abscesses are slightly smaller compared to prior exam. Persistent ascites. Mild thickened walled proximal small bowel loops likely due to inflammation from adjacent pancreatitis. There is no evidence of small bowel obstruction. Electronically Signed   By: Sherian ReinWei-Chen  Lin M.D.   On: 05/20/2016 07:29   Ct Abdomen Pelvis W Contrast  Result Date: 05/09/2016 CLINICAL DATA:  Hemorrhagic pancreatitis, nausea, vomiting. EXAM: CT ABDOMEN AND PELVIS WITH CONTRAST TECHNIQUE: Multidetector CT imaging of the abdomen and pelvis was performed using the standard protocol following bolus administration of intravenous contrast. CONTRAST:  100mL ISOVUE-300 IOPAMIDOL (ISOVUE-300) INJECTION 61% COMPARISON:  CT scan of May 02, 2016. FINDINGS: Lower chest: Mild left pleural effusion is noted with adjacent subsegmental atelectasis. Hepatobiliary: No gallstones are noted. Mild amount of fluid is noted around the liver. No focal hepatic lesion is noted. Pancreas: There is again noted multifocal abscesses within the retroperitoneum surrounding atretic pancreas. Increased amount of gas is present within the larger loculations. Overall, does not appear to be significantly changed in size. Spleen: Normal. Adrenals/Urinary Tract: Right adrenal gland appears normal. Stable left adrenal gland enlargement is noted. Right nephrolithiasis is noted. No hydronephrosis or renal obstruction is noted. Urinary bladder appears normal. Stomach/Bowel: Feeding tube is seen with distal tip in third portion of duodenum. There is no evidence of bowel obstruction.  Normal appendix. Diverticulosis of descending and sigmoid colon is noted without inflammation. Vascular/Lymphatic: Atherosclerosis of abdominal aorta is noted without aneurysm formation. Celiac, superior mesenteric and and inferior mesenteric arteries are patent. Reproductive: Stable mild prostatic enlargement with calcifications is noted. Other: Moderate anasarca is noted. Musculoskeletal: Severe multifocal degenerative disc disease is noted. IMPRESSION: Mild left pleural effusion with adjacent subsegmental atelectasis. Aortic atherosclerosis. Feeding tube seen with distal tip in third portion of duodenum. Diverticulosis of descending and sigmoid colon is noted without inflammation. Stable mild prostatic enlargement. Moderate anasarca. Continued presence of multifocal abscesses within the retroperitoneal surrounding atretic pancreas, which does not appear to be significantly is changed in size, although there is an increased amount of gas present within the loculations consistent with infection. Electronically Signed   By: Lupita RaiderJames  Green Jr, M.D.   On: 05/09/2016 17:15   Dg Chest Port 1 View  Result Date: 05/18/2016 CLINICAL DATA:  Shortness of breath. EXAM: PORTABLE CHEST 1 VIEW COMPARISON:  May 15, 2016 FINDINGS: Feeding tube terminates below today's film. Stable right PICC line. No pneumothorax. Low lung volumes on the right with no focal infiltrate. Layering effusion and underlying opacity on the left were not seen previously. Stable cardiomediastinal silhouette. IMPRESSION: New layering effusion and underlying opacity in the left base. Electronically Signed   By: Gerome Samavid  Williams III M.D   On: 05/18/2016 07:32   Dg Chest Port 1 View  Result Date: 05/15/2016 CLINICAL DATA:  Dyspnea. History of hypertension and diabetes, remote history of smoking. EXAM: PORTABLE CHEST 1 VIEW COMPARISON:  Portable chest x-ray of May 12, 2016 FINDINGS: The lungs remain hypoinflated. The appearance is accentuated by  the lordotic positioning. The interstitial markings are coarse. There is no alveolar infiltrate. There is no pleural effusion. The heart and pulmonary vascularity are normal. There is tortuosity of the ascending and descending thoracic aorta. The feeding tube tip projects below the inferior margin of the image. A pigtail catheter projects over the mid upper abdomen in the midline. IMPRESSION: Bilateral hypoinflation. Mild interstitial prominence in part due to vascular crowding but mild interstitial edema or infiltrate may be developing. No alveolar pneumonia. Electronically Signed   By: David  [place] M.D.   On: 05/15/2016 08:07   Dg Chest Port 1 View  Result Date: 05/12/2016 CLINICAL DATA:  Cough, history of hypertension, hyperlipidemia, and diabetes. EXAM: PORTABLE CHEST 1 VIEW COMPARISON:  Portable chest x-ray of May 08, 2016 FINDINGS: The lung volumes remain low. There is increased density at the left lung base with new obscuration of the hemidiaphragm. The heart and pulmonary vascularity are normal. There is tortuosity of the ascending and descending thoracic aorta. The PICC line tip on the right projects over the midportion of the SVC. The feeding tube tip projects below the inferior margin of the image beyond the second portion of the duodenum. IMPRESSION: Increased density at the left lung base consistent with atelectasis or pneumonia with small left pleural effusion. Stable bilateral hypo nflation. Electronically Signed   By: David  [place] M.D.   On: 05/12/2016 09:27   Dg Abd Portable 1v  Result Date: 05/26/2016 CLINICAL DATA:  Check feeding catheter placement EXAM: PORTABLE ABDOMEN - 1 VIEW COMPARISON:  05/21/2016 FINDINGS: Feeding catheter is again noted in the proximal jejunum. A pigtail catheter is noted within the stomach. Scattered large and small bowel gas is noted. Prostate calcifications are seen and stable. Degenerative change of the lumbar spine is noted. IMPRESSION: Feeding  catheter in the proximal jejunum similar to that seen on prior exam. Electronically Signed   By: Alcide Clever M.D.   On: 05/26/2016 15:13   Dg Abd Portable 1v  Result Date: 05/21/2016 CLINICAL DATA:  Initial evaluation for enteric tube placement. EXAM: PORTABLE ABDOMEN - 1 VIEW COMPARISON:  Prior radiograph from earlier the same day. FINDINGS: Core pack feeding tube in place with tip following the expected course of the stomach, duodenum, and proximal bowel. Tip overlies the left abdomen, likely in the proximal jejunum. This is been advanced from prior. Percutaneous G-tube in place. Visualized bowel gas pattern within normal limits. Blunting of left costophrenic angle similar to prior. IMPRESSION: Tip of feeding tube overlying the proximal jejunum. Electronically Signed   By: Rise Mu M.D.   On: 05/21/2016 22:42   Dg Abd Portable 1v  Result Date: 05/21/2016 CLINICAL DATA:  Feeding tube placement EXAM: PORTABLE ABDOMEN - 1 VIEW COMPARISON:  CT scan 05/20/2016 FINDINGS: Gaseous distended small bowel loops are noted consistent with ileus. There is NG feeding tube with tip within jejunum. A drain is noted in left upper abdomen. IMPRESSION: NG feeding tube with tip within jejunum. Electronically Signed   By: Natasha Mead M.D.   On: 05/21/2016 16:19   Dg Abd Portable 1v  Result Date: 05/10/2016 CLINICAL DATA:  NG feeding tube placement EXAM: PORTABLE ABDOMEN - 1 VIEW COMPARISON:  None. FINDINGS: Mild gaseous distended small bowel loops in mid abdomen probable mild ileus. There is NG  feeding tube with tip in proximal jejunum. IMPRESSION: NG feeding tube with tip in proximal jejunum. Electronically Signed   By: Natasha Mead M.D.   On: 05/10/2016 13:28   Dg Abd Portable 1v  Result Date: 05/06/2016 CLINICAL DATA:  Feeding tube placement EXAM: PORTABLE ABDOMEN - 1 VIEW COMPARISON:  05/03/2016 FINDINGS: Upper abdomen is non included. Nonobstructed gas pattern. Esophageal tube tip overlies the junction  of second and third portion of duodenum. IMPRESSION: Esophageal tube tip overlies the junction of second and third portion of duodenum. Electronically Signed   By: Jasmine Pang M.D.   On: 05/06/2016 19:09   Dg Abd Portable 2v  Result Date: 05/15/2016 CLINICAL DATA:  Constipation EXAM: PORTABLE ABDOMEN - 2 VIEW COMPARISON:  CT abdomen 05/13/2016 FINDINGS: Feeding tube tip in the proximal jejunum. Pigtail drainage catheter in the epigastric region in the region of the pancreas. Mild to moderate amount of stool in the colon. Negative for bowel obstruction. No free air on the decubitus view. IMPRESSION: Mild moderate stool in the colon without bowel obstruction or perforation Feeding tube tip in the jejunum Pigtail drainage catheter in the pancreas region. Electronically Signed   By: Marlan Palau M.D.   On: 05/15/2016 10:11   Ct Image Guided Drainage By Percutaneous Catheter  Result Date: 05/13/2016 CLINICAL DATA:  Severe pancreatitis with infected necrosis of the entire pancreas and formation of infected pseudocyst. Referral for percutaneous drainage of infected pseudocyst given high risk currently for surgical debridement. EXAM: CT GUIDED CATHETER DRAINAGE OF RETROPERITONEAL PANCREATIC ABSCESS ANESTHESIA/SEDATION: 1.0 mg IV Versed 25 mcg IV Fentanyl Total Moderate Sedation Time:  25 minutes The patient's level of consciousness and physiologic status were continuously monitored during the procedure by Radiology nursing. PROCEDURE: The procedure, risks, benefits, and alternatives were explained to the patient. Questions regarding the procedure were encouraged and answered. The patient understands and consents to the procedure. A time out was performed prior to initiating the procedure. The left lateral abdominal wall was prepped with chlorhexidine in a sterile fashion, and a sterile drape was applied covering the operative field. A sterile gown and sterile gloves were used for the procedure. Local  anesthesia was provided with 1% Lidocaine. CT was performed. After localizing a far left lateral retroperitoneal approach to the pancreas, an 18 gauge trocar needle was advanced under CT fluoroscopic guidance to the level of the infected pancreatic pseudocyst. After confirming needle tip position, a guidewire was advanced. The tract was dilated and a 14 French pigtail drainage catheter advanced. The catheter was formed and aspirated. A fluid sample was sent for culture analysis. The catheter was secured at the skin with a Prolene retention suture and StatLock device. It was connected to suction bulb drainage. COMPLICATIONS: None FINDINGS: Drainage catheter course was along the rough course of the tail of the pancreas with the drain terminating in the region of the body of the pancreas. The necrotic pancreas and infected pseudocyst yielded blood tinged, purulent fluid with debris. A sample was sent for culture analysis. IMPRESSION: CT-guided drainage of large region of pancreatic necrosis and infected pseudocyst formation. A 14 French percutaneous drain was placed from a left lateral retroperitoneal approach. The drain was attached to suction bulb drainage. A fluid sample was sent for culture analysis. Electronically Signed   By: Irish Lack M.D.   On: 05/13/2016 16:55    Labs:  CBC:  Recent Labs  05/21/16 1238 05/22/16 0635 05/24/16 0600 05/29/16 0545  WBC 15.9* 10.3 11.0* 10.6*  HGB 10.6*  10.1* 10.1* 10.2*  HCT 32.3* 30.8* 31.0* 31.5*  PLT 252 236 264 277    COAGS:  Recent Labs  03/27/16 0500 04/23/16 0507 05/03/16 0242 05/12/16 1415  INR 1.23 1.39 1.43 1.28    BMP:  Recent Labs  05/20/16 0202 05/22/16 0635 05/24/16 0600 05/29/16 0545  NA 133* 136 138 135  K 3.5 3.2* 3.2* 3.5  CL 101 102 100* 98*  CO2 26 30 32 31  GLUCOSE 101* 141* 136* 181*  BUN 11 15 13 15   CALCIUM 7.8* 7.8* 8.0* 8.1*  CREATININE 0.62 0.69 0.64 0.68  GFRNONAA >60 >60 >60 >60  GFRAA >60 >60 >60  >60    LIVER FUNCTION TESTS:  Recent Labs  05/15/16 0600 05/16/16 0248 05/22/16 0635 05/29/16 0545  BILITOT 0.6 0.5 1.0 0.5  AST 25 33 26 34  ALT 23 25 28  42  ALKPHOS 209* 193* 147* 104  PROT 5.1* 5.1* 5.5* 5.8*  ALBUMIN 1.4* 1.4* 1.6* 1.7*    TUMOR MARKERS: No results for input(s): AFPTM, CEA, CA199, CHROMGRNA in the last 8760 hours.  Assessment and Plan:  Admitted with hemorrhagic pancreatitis 03/2016 Infected pancreatic pseudocyst drain placed 11/28---in place Now with B LE DVT---cannot anticoagulate secondary recent hemorrhagic pancreatitis Tentatively scheduled for retrievable inferior vena cava filter placement  Risks and Benefits discussed with the patient including, but not limited to bleeding, infection, contrast induced renal failure, filter fracture or migration which can lead to emergency surgery or even death, strut penetration with damage or irritation to adjacent structures and caval thrombosis. All of the patient's questions were answered, patient is agreeable to proceed. Consent signed and in chart.   Thank you for this interesting consult.  I greatly enjoyed meeting Colin West and look forward to participating in their care.  A copy of this report was sent to the requesting provider on this date.  Electronically Signed: Javier Mamone A 06/03/2016, 12:44 PM   I spent a total of 40 Minutes    in face to face in clinical consultation, greater than 50% of which was counseling/coordinating care for retrievable IVC filter placement

## 2016-06-03 NOTE — Progress Notes (Signed)
Patient ID: Farris HasGraham N Frisinger, male   DOB: 1933/07/07, 82 y.o.   MRN: [id number]   Request for retrievable inferior vena cava filter placement See my note from today.  Dr Miles CostainShick has seen imaging Will need B LE doppler study prior to filter placement  Discussed with Dr Sharyon MedicusHijazi Aware and agreeable  Still tentatively scheduled for 12/20----await doppler imaging and report.

## 2016-06-04 ENCOUNTER — Encounter (HOSPITAL_COMMUNITY): Payer: Self-pay | Admitting: Interventional Radiology

## 2016-06-04 ENCOUNTER — Other Ambulatory Visit (HOSPITAL_COMMUNITY): Payer: Medicare Other

## 2016-06-04 ENCOUNTER — Encounter (HOSPITAL_BASED_OUTPATIENT_CLINIC_OR_DEPARTMENT_OTHER): Payer: Medicare Other

## 2016-06-04 DIAGNOSIS — Z86718 Personal history of other venous thrombosis and embolism: Secondary | ICD-10-CM

## 2016-06-04 HISTORY — PX: IR GENERIC HISTORICAL: IMG1180011

## 2016-06-04 LAB — BASIC METABOLIC PANEL
ANION GAP: 4 — AB (ref 5–15)
BUN: 19 mg/dL (ref 6–20)
CALCIUM: 8.9 mg/dL (ref 8.9–10.3)
CHLORIDE: 103 mmol/L (ref 101–111)
CO2: 33 mmol/L — AB (ref 22–32)
Creatinine, Ser: 0.75 mg/dL (ref 0.61–1.24)
GFR calc non Af Amer: >60 mL/min (ref >60)
GLUCOSE: 124 mg/dL — AB (ref 65–99)
Potassium: 3.7 mmol/L (ref 3.5–5.1)
Sodium: 140 mmol/L (ref 135–145)

## 2016-06-04 LAB — CBC
HEMATOCRIT: 35.5 % — AB (ref 39.0–52.0)
HEMOGLOBIN: 11.2 g/dL — AB (ref 13.0–17.0)
MCH: 30 pg (ref 26.0–34.0)
MCHC: 31.5 g/dL (ref 30.0–36.0)
MCV: 95.2 fL (ref 78.0–100.0)
Platelets: 328 10*3/uL (ref 150–400)
RBC: 3.73 MIL/uL — ABNORMAL LOW (ref 4.22–5.81)
RDW: 14.8 % (ref 11.5–15.5)
WBC: 11.3 10*3/uL — AB (ref 4.0–10.5)

## 2016-06-04 LAB — PROTIME-INR
INR: 1.15
PROTHROMBIN TIME: 14.8 s (ref 11.4–15.2)

## 2016-06-04 LAB — LIPASE, BLOOD: Lipase: 15 U/L (ref 11–51)

## 2016-06-04 MED ORDER — FENTANYL CITRATE (PF) 100 MCG/2ML IJ SOLN
INTRAMUSCULAR | Status: AC
  Filled 2016-06-04: qty 2

## 2016-06-04 MED ORDER — LIDOCAINE HCL 1 % IJ SOLN
INTRAMUSCULAR | Status: AC
  Administered 2016-06-04: 10 mL
  Filled 2016-06-04: qty 20

## 2016-06-04 MED ORDER — IOPAMIDOL (ISOVUE-300) INJECTION 61%
INTRAVENOUS | Status: AC
  Administered 2016-06-04: 50 mL
  Filled 2016-06-04: qty 100

## 2016-06-04 MED ORDER — LIDOCAINE HCL 1 % IJ SOLN
INTRAMUSCULAR | Status: AC | PRN
  Administered 2016-06-04 (×2): 10 mL

## 2016-06-04 MED ORDER — FENTANYL CITRATE (PF) 100 MCG/2ML IJ SOLN
INTRAMUSCULAR | Status: AC | PRN
  Administered 2016-06-04: 25 ug via INTRAVENOUS

## 2016-06-04 MED ORDER — MIDAZOLAM HCL 2 MG/2ML IJ SOLN
INTRAMUSCULAR | Status: AC
  Filled 2016-06-04: qty 2

## 2016-06-04 MED ORDER — SODIUM CHLORIDE 0.9 % IV SOLN
INTRAVENOUS | Status: AC | PRN
  Administered 2016-06-04: 30 mL/h via INTRAVENOUS

## 2016-06-04 MED ORDER — MIDAZOLAM HCL 2 MG/2ML IJ SOLN
INTRAMUSCULAR | Status: AC | PRN
  Administered 2016-06-04: 1 mg via INTRAVENOUS

## 2016-06-04 NOTE — Sedation Documentation (Signed)
O2 d/c'd 

## 2016-06-04 NOTE — Procedures (Signed)
IVCgram: negative IVC filter placed, infrarenal, retrievable No complication No blood loss. See complete dictation in [facility] PACS  

## 2016-06-04 NOTE — Progress Notes (Signed)
*  PRELIMINARY RESULTS* Vascular Ultrasound Bilateral lower extremity venous duplex has been completed.  Preliminary findings: small segments of non occlusive acute deep vein thrombosis noted in bilateral common femoral veins. Attempt was made to visualize right and left iliac veins as well as inferior vena cava- visualized portions appear patent.  Preliminary results given to RN, Gaye PollackCandris  Derrek Puff C Sashia Campas 06/04/2016, 10:43 AM

## 2016-06-04 NOTE — Sedation Documentation (Signed)
O2 2l/Chancellor started 

## 2016-06-09 LAB — CBC
HEMATOCRIT: 35.3 % — AB (ref 39.0–52.0)
HEMOGLOBIN: 11.2 g/dL — AB (ref 13.0–17.0)
MCH: 30.2 pg (ref 26.0–34.0)
MCHC: 31.7 g/dL (ref 30.0–36.0)
MCV: 95.1 fL (ref 78.0–100.0)
Platelets: 251 10*3/uL (ref 150–400)
RBC: 3.71 MIL/uL — ABNORMAL LOW (ref 4.22–5.81)
RDW: 14.9 % (ref 11.5–15.5)
WBC: 10.1 10*3/uL (ref 4.0–10.5)

## 2016-06-09 LAB — BASIC METABOLIC PANEL
ANION GAP: 6 (ref 5–15)
BUN: 18 mg/dL (ref 6–20)
CHLORIDE: 101 mmol/L (ref 101–111)
CO2: 29 mmol/L (ref 22–32)
Calcium: 8.7 mg/dL — ABNORMAL LOW (ref 8.9–10.3)
Creatinine, Ser: 0.68 mg/dL (ref 0.61–1.24)
GFR calc non Af Amer: >60 mL/min (ref >60)
Glucose, Bld: 179 mg/dL — ABNORMAL HIGH (ref 65–99)
POTASSIUM: 3.6 mmol/L (ref 3.5–5.1)
Sodium: 136 mmol/L (ref 135–145)

## 2016-06-09 LAB — LIPASE, BLOOD: Lipase: 16 U/L (ref 11–51)

## 2016-06-17 ENCOUNTER — Other Ambulatory Visit (HOSPITAL_COMMUNITY): Payer: Medicare Other

## 2016-06-17 LAB — CBC
HCT: 33.5 % — ABNORMAL LOW (ref 39.0–52.0)
Hemoglobin: 10.9 g/dL — ABNORMAL LOW (ref 13.0–17.0)
MCH: 29.9 pg (ref 26.0–34.0)
MCHC: 32.5 g/dL (ref 30.0–36.0)
MCV: 92 fL (ref 78.0–100.0)
PLATELETS: 252 10*3/uL (ref 150–400)
RBC: 3.64 MIL/uL — ABNORMAL LOW (ref 4.22–5.81)
RDW: 14.2 % (ref 11.5–15.5)
WBC: 9.7 10*3/uL (ref 4.0–10.5)

## 2016-06-17 LAB — COMPREHENSIVE METABOLIC PANEL
ALK PHOS: 74 U/L (ref 38–126)
ALT: 17 U/L (ref 17–63)
ANION GAP: 9 (ref 5–15)
AST: 20 U/L (ref 15–41)
Albumin: 2.2 g/dL — ABNORMAL LOW (ref 3.5–5.0)
BUN: 8 mg/dL (ref 6–20)
CALCIUM: 8.7 mg/dL — AB (ref 8.9–10.3)
CHLORIDE: 102 mmol/L (ref 101–111)
CO2: 28 mmol/L (ref 22–32)
Creatinine, Ser: 0.76 mg/dL (ref 0.61–1.24)
GFR calc non Af Amer: >60 mL/min (ref >60)
Glucose, Bld: 127 mg/dL — ABNORMAL HIGH (ref 65–99)
POTASSIUM: 2.9 mmol/L — AB (ref 3.5–5.1)
SODIUM: 139 mmol/L (ref 135–145)
Total Bilirubin: 1 mg/dL (ref 0.3–1.2)
Total Protein: 6.1 g/dL — ABNORMAL LOW (ref 6.5–8.1)

## 2016-06-17 LAB — LIPASE, BLOOD: Lipase: 15 U/L (ref 11–51)

## 2016-06-17 MED ORDER — IOPAMIDOL (ISOVUE-300) INJECTION 61%
50.0000 mL | Freq: Once | INTRAVENOUS | Status: AC | PRN
  Administered 2016-06-17: 15 mL

## 2016-06-17 MED ORDER — LIDOCAINE VISCOUS 2 % MT SOLN
15.0000 mL | Freq: Once | OROMUCOSAL | Status: AC
  Administered 2016-06-17: 5 mL via OROMUCOSAL

## 2016-06-18 ENCOUNTER — Institutional Professional Consult (permissible substitution) (HOSPITAL_COMMUNITY): Payer: Medicare Other

## 2016-06-18 LAB — POTASSIUM: POTASSIUM: 3.4 mmol/L — AB (ref 3.5–5.1)

## 2016-06-18 MED ORDER — IOPAMIDOL (ISOVUE-300) INJECTION 61%
100.0000 mL | Freq: Once | INTRAVENOUS | Status: AC | PRN
  Administered 2016-06-18: 100 mL via INTRAVENOUS

## 2016-06-19 LAB — POTASSIUM: POTASSIUM: 3.6 mmol/L (ref 3.5–5.1)

## 2016-06-19 NOTE — Progress Notes (Signed)
Patient ID: Colin West, male   DOB: 11-Oct-1933, 83 y.o.   MRN: [id number]    Referring Physician(s): Dr. Carron Curie  Supervising Physician: Richarda Overlie  Patient Status: [facility]  Chief Complaint: Pancreatic pseudocyst  Subjective: Patient doing well.  Plan to go home on Monday.  CT scan from yesterday discussed with surgery over the phone today and request made for drain to be removed.  He has not had his drain flushed, but is having about 10cc of brown output in his drain.  Allergies: Patient has no known allergies.  Medications: Prior to Admission medications   Medication Sig Start Date End Date Taking? Authorizing Provider  chlorpheniramine-HYDROcodone (TUSSIONEX) 10-8 MG/5ML SUER Take 5 mLs by mouth every 12 (twelve) hours as needed for cough. 05/21/16   Rolly Salter, MD  docusate (COLACE) 50 MG/5ML liquid Place 5 mLs (50 mg total) into feeding tube daily. 05/22/16   Rolly Salter, MD  furosemide (LASIX) 40 MG tablet Take 1 tablet (40 mg total) by mouth daily. 05/21/16   Rolly Salter, MD  guaiFENesin-dextromethorphan (ROBITUSSIN DM) 100-10 MG/5ML syrup Take 5 mLs by mouth 4 (four) times daily. 05/21/16   Rolly Salter, MD  imipenem-cilastatin 500 mg in sodium chloride 0.9 % 100 mL Inject 500 mg into the vein every 6 (six) hours. 05/21/16   Rolly Salter, MD  metoCLOPramide (REGLAN) 5 MG tablet Take 5-10 mg by mouth every 8 (eight) hours as needed (for hiccups).    Historical Provider, MD  metoprolol tartrate (LOPRESSOR) 25 mg/10 mL SUSP Place 40 mLs (100 mg total) into feeding tube 2 (two) times daily. 05/21/16   Rolly Salter, MD  Nutritional Supplements (FEEDING SUPPLEMENT, JEVITY 1.2 CAL,) LIQD Place 1,000 mLs into feeding tube continuous. 05/21/16   Rolly Salter, MD  ondansetron (ZOFRAN) 4 MG tablet Take 4 mg by mouth every 8 (eight) hours as needed for nausea or vomiting.    Historical Provider, MD  pantoprazole sodium (PROTONIX) 40 mg/20 mL PACK Place 20 mLs (40  mg total) into feeding tube 2 (two) times daily. 05/21/16   Rolly Salter, MD  polyethylene glycol (MIRALAX / Ethelene Hal) packet Take 17 g by mouth daily. 05/22/16   Rolly Salter, MD    Vital Signs: BP (!) 142/85   Pulse (!) 107   Resp 18   SpO2 95%   Physical Exam: Abd: drain removed from LUQ with no issues; however, after drain removed copious (approximately 300+cc) of serous/slightly cloudy serous yellow fluid drained from his site.  At times when speaking this fluid would shoot out under pressure in a stream.  A urostomy bag was placed over the site to control drainage for now  Imaging: Ct Abdomen Pelvis W Contrast  Result Date: 06/18/2016 CLINICAL DATA:  Pancreatitis, pancreatic abscess EXAM: CT ABDOMEN AND PELVIS WITH CONTRAST TECHNIQUE: Multidetector CT imaging of the abdomen and pelvis was performed using the standard protocol following bolus administration of intravenous contrast. CONTRAST:  ISOVUE-300 IOPAMIDOL (ISOVUE-300) INJECTION 61% COMPARISON:  CT from 06/02/2016 FINDINGS: Lower chest: Slightly larger left effusion with atelectasis. Subsegmental atelectasis and/or scarring at the right lung base is stable. Visualized cardiac chambers are within normal limits for size. There is coronary arteriosclerosis. There is aortic atherosclerosis of the thoracic aorta. Hepatobiliary: There is colonic interposition over the liver. Fatty infiltration of the liver parenchyma without space-occupying mass. Tiny dependent gallstones are seen, the largest approximately 6 mm. No biliary dilatation. Pancreas: Chronic stable  peripancreatic loculated fluid collections without significant change. Mottled gas noted within the main fluid collection anterior to the pancreatic body and head, unchanged in appearance. Percutaneous pigtail drainage catheter is situated anterior to the pancreatic body and tail. Stable mild regional areas of inflammation and edema. Spleen: Normal in size without focal  abnormality. Adrenals/Urinary Tract: Normal bilateral adrenal glands. Stable hypodense cysts in both kidneys. No solid-appearing renal abnormality. No obstructive uropathy. Hyperdensity along the dependent wall of the bladder consistent with layering stones. Stomach/Bowel: Feeding tube is seen to the stomach extending into the jejunum. The stomach is moderately distended fluid. No bowel obstruction or acute inflammation. There is scattered colonic diverticula along the descending and sigmoid colon. Normal-appearing appendix. Vascular/Lymphatic: Aortoiliac arterial calcifications without aneurysm or stenosis. Patent portal venous system. Reproductive: Mild prostatic enlargement with peripheral zone calcifications. Other: No abdominal wall hernia or abnormality. No abdominopelvic ascites. Musculoskeletal: Extensive spondylitic changes of the thoracolumbar spine. No worrisome osseous lesions. Chronic bilateral DVT in the common femoral veins partially 3 cannulated in appearance of the right and occluded in appearance on the left. IMPRESSION: 1. Slightly larger left pleural effusion with atelectasis. 2. No significant change in peripancreatic pseudocysts with stable position of pigtail drainage catheter. 3. Chronic bilateral DVT of the lower extremities 4. Coronary and aortoiliac atheromatous calcifications. Electronically Signed   By: Tollie Eth M.D.   On: 06/18/2016 19:54   Dg Abd 1 View  Result Date: 06/17/2016 CLINICAL DATA:  Encounter for feeding tube placement under fluoro. EXAM: ABDOMEN - 1 VIEW COMPARISON:  None. FINDINGS: Single spot fluoro graphic image demonstrates an enteric feeding tube that passes through the duodenum into the proximal jejunum. Contrast has been injected, filling a segment of the jejunum at the enteric tube tip. IMPRESSION: Well-positioned enteric tube extending into the proximal jejunum. Tube placed under fluoroscopic guidance. Electronically Signed   By: Amie Portland M.D.   On:  06/17/2016 15:32    Labs:  CBC:  Recent Labs  05/29/16 0545 06/04/16 0500 06/09/16 0515 06/17/16 0648  WBC 10.6* 11.3* 10.1 9.7  HGB 10.2* 11.2* 11.2* 10.9*  HCT 31.5* 35.5* 35.3* 33.5*  PLT 277 328 251 252    COAGS:  Recent Labs  04/23/16 0507 05/03/16 0242 05/12/16 1415 06/04/16 0500  INR 1.39 1.43 1.28 1.15    BMP:  Recent Labs  05/29/16 0545 06/04/16 0500 06/09/16 0515 06/17/16 0648 06/18/16 0600 06/19/16 0500  NA 135 140 136 139  --   --   K 3.5 3.7 3.6 2.9* 3.4* 3.6  CL 98* 103 101 102  --   --   CO2 31 33* 29 28  --   --   GLUCOSE 181* 124* 179* 127*  --   --   BUN 15 19 18 8   --   --   CALCIUM 8.1* 8.9 8.7* 8.7*  --   --   CREATININE 0.68 0.75 0.68 0.76  --   --   GFRNONAA >60 >60 >60 >60  --   --   GFRAA >60 >60 >60 >60  --   --     LIVER FUNCTION TESTS:  Recent Labs  05/16/16 0248 05/22/16 0635 05/29/16 0545 06/17/16 0648  BILITOT 0.5 1.0 0.5 1.0  AST 33 26 34 20  ALT 25 28 42 17  ALKPHOS 193* 147* 104 74  PROT 5.1* 5.5* 5.8* 6.1*  ALBUMIN 1.4* 1.6* 1.7* 2.2*    Assessment and Plan: 1. Infected pancreatic pseudocyst, s/p drain placement on 11/28 -drain  was removed however, copious amounts of liquid drained out of site after removal.  Dr. Lowella DandyHenn is in a procedure so I discussed this over the phone with Dr. Grace IsaacWatts.  I have placed a urostomy bag over the site right now to control leakage.  We will check how much output the patient is having in the morning.  If it is still a significant amount, he may need to return to IR at least for a tract injection vs placement of a new drain. -NPO p MN -will follow up in the morning to determine further plans.  Electronically Signed: Letha CapeSBORNE,Nysir Fergusson E 06/19/2016, 3:54 PM   I spent a total of 15 Minutes at the the patient's bedside AND on the patient's hospital floor or unit, greater than 50% of which was counseling/coordinating care for infected pancreatic pseudocyst

## 2016-06-20 DIAGNOSIS — I471 Supraventricular tachycardia: Secondary | ICD-10-CM

## 2016-06-20 DIAGNOSIS — R071 Chest pain on breathing: Secondary | ICD-10-CM

## 2016-06-20 DIAGNOSIS — I5022 Chronic systolic (congestive) heart failure: Secondary | ICD-10-CM

## 2016-06-20 NOTE — Progress Notes (Signed)
       Patient Name: Colin West Date of Encounter: 06/20/2016    SUBJECTIVE:Awake and alert. No complaints. Since yesterday he has had left lower chest/axillary discomfort with certain movements. Coughing and twisting aggravates the discomfort.  TELEMETRY:  NSR Vitals:   06/04/16 1605 06/04/16 1621 06/04/16 1625 06/04/16 1633  BP: (!) 128/97 (!) 126/98 (!) 144/85 (!) 142/85  Pulse: (!) 109 (!) 111 (!) 111 (!) 107  Resp: 14 (!) 23 20 18   SpO2: 98% 98% 96% 95%   No intake or output data in the 24 hours ending 06/20/16 1117 LABS: Basic Metabolic Panel:  Recent Labs  16/03/9600/03/18 0600 06/19/16 0500  K 3.4* 3.6   Cardiac Studies:  Echocardiogram 05/08/16: Study Conclusions  - Procedure narrative: Transthoracic echocardiography. Image   quality was adequate. The study was technically difficult. - Left ventricle: The cavity size was normal. Wall thickness was   increased in a pattern of mild LVH. Systolic function was   moderately reduced. The estimated ejection fraction was in the   range of 35% to 40%. Diffuse hypokinesis. Doppler parameters are   consistent with abnormal left ventricular relaxation (grade 1   diastolic dysfunction). - Aortic valve: Moderately calcified annulus. Trileaflet; mildly   calcified leaflets. - Mitral valve: Calcified annulus. There was mild regurgitation. - Tricuspid valve: There was trivial regurgitation. - Pulmonary arteries: Systolic pressure could not be accurately   estimated. - Pericardium, extracardiac: There was no pericardial effusion.  Impressions:  - Mild LVH with LVEF approximately 35-40%, diffuse hypokinesis.   Grade 1 diastolic dysfunction. Mitral annular calcification with   mild mitral regurgitation. Mildly sclerotic aortic valve. Trivial   tricuspid regurgitation.  Physical Exam: Blood pressure (!) 142/85, pulse (!) 107, resp. rate 18, SpO2 95 %. Weight change:   Wt Readings from Last 3 Encounters:  05/21/16 173 lb  (78.5 kg)  05/02/16 162 lb (73.5 kg)  04/30/16 162 lb (73.5 kg)   Frail, alert, and in no distress. Neck veins are flat Cardiac reveals rapid rate, but regular. Chest with decreased breath sounds. Mild left axillary tenderness. Alert and conversant.   ASSESSMENT:  1. Chest pain is not ischemic in nature. Has qualities of musculoskeletal or pleuritic pain. 2. Chronic systolic heart failure. EF 35%, and etiology unclear. Needs evaluation as OP with repeat echo and possible ischemic evaluation once stronger. 3. H/O SVT/MAT, currently in NSR. No atrial fibrillation identified.  Plan:  1. Please arrange OP follow-up once back at home and independent unless more acute symptoms occur.  Signed, Lyn RecordsHenry W Joleigh Mineau III 06/20/2016, 11:17 AM

## 2016-06-21 ENCOUNTER — Other Ambulatory Visit (HOSPITAL_COMMUNITY): Payer: Medicare Other

## 2016-06-21 ENCOUNTER — Encounter: Payer: Self-pay | Admitting: Radiology

## 2016-06-21 MED ORDER — IOPAMIDOL (ISOVUE-300) INJECTION 61%
INTRAVENOUS | Status: AC
  Administered 2016-06-21: 100 mL
  Filled 2016-06-21: qty 100

## 2016-06-21 MED ORDER — IOPAMIDOL (ISOVUE-300) INJECTION 61%
INTRAVENOUS | Status: AC
  Filled 2016-06-21: qty 30

## 2016-06-23 LAB — COMPREHENSIVE METABOLIC PANEL
ALK PHOS: 66 U/L (ref 38–126)
ALT: 12 U/L — ABNORMAL LOW (ref 17–63)
AST: 14 U/L — AB (ref 15–41)
Albumin: 2 g/dL — ABNORMAL LOW (ref 3.5–5.0)
Anion gap: 9 (ref 5–15)
BILIRUBIN TOTAL: 1.3 mg/dL — AB (ref 0.3–1.2)
BUN: 15 mg/dL (ref 6–20)
CHLORIDE: 99 mmol/L — AB (ref 101–111)
CO2: 24 mmol/L (ref 22–32)
CREATININE: 0.89 mg/dL (ref 0.61–1.24)
Calcium: 8.4 mg/dL — ABNORMAL LOW (ref 8.9–10.3)
Glucose, Bld: 123 mg/dL — ABNORMAL HIGH (ref 65–99)
Potassium: 3.4 mmol/L — ABNORMAL LOW (ref 3.5–5.1)
Sodium: 132 mmol/L — ABNORMAL LOW (ref 135–145)
Total Protein: 5.9 g/dL — ABNORMAL LOW (ref 6.5–8.1)

## 2016-06-23 LAB — CBC
HCT: 31 % — ABNORMAL LOW (ref 39.0–52.0)
HEMOGLOBIN: 10.6 g/dL — AB (ref 13.0–17.0)
MCH: 30.6 pg (ref 26.0–34.0)
MCHC: 34.2 g/dL (ref 30.0–36.0)
MCV: 89.6 fL (ref 78.0–100.0)
PLATELETS: 238 10*3/uL (ref 150–400)
RBC: 3.46 MIL/uL — AB (ref 4.22–5.81)
RDW: 14.6 % (ref 11.5–15.5)
WBC: 14.3 10*3/uL — AB (ref 4.0–10.5)

## 2016-06-23 LAB — PROTIME-INR
INR: 1.55
Prothrombin Time: 18.7 seconds — ABNORMAL HIGH (ref 11.4–15.2)

## 2016-06-23 LAB — LIPASE, BLOOD: LIPASE: 13 U/L (ref 11–51)

## 2016-06-23 NOTE — Progress Notes (Addendum)
Patient ID: Colin West, male   DOB: 03-31-1934, 83 y.o.   MRN: [id number]    Referring Physician(s): Dr. Carron Curie  Supervising Physician: Jolaine Click  Patient Status: [facility] inpatient  Chief Complaint: Infected pancreatic pseudocyst  Subjective: Patient is known to our service for a recently pulled infected pancreatic pseudocyst drain.  He was followed after the drain was removed secondary to copious slightly cloudy serous drainage.  By the following day the drainage had ceased and no further work up was felt indicated.  The patient currently feels well with no increase in abdominal pain, no further drainage from the drain site, or fevers.  We have been asked to see the patient to discuss possible drain replacement.   Allergies: Patient has no known allergies.  Medications: Prior to Admission medications   Medication Sig Start Date End Date Taking? Authorizing Provider  chlorpheniramine-HYDROcodone (TUSSIONEX) 10-8 MG/5ML SUER Take 5 mLs by mouth every 12 (twelve) hours as needed for cough. 05/21/16   Rolly Salter, MD  docusate (COLACE) 50 MG/5ML liquid Place 5 mLs (50 mg total) into feeding tube daily. 05/22/16   Rolly Salter, MD  furosemide (LASIX) 40 MG tablet Take 1 tablet (40 mg total) by mouth daily. 05/21/16   Rolly Salter, MD  guaiFENesin-dextromethorphan (ROBITUSSIN DM) 100-10 MG/5ML syrup Take 5 mLs by mouth 4 (four) times daily. 05/21/16   Rolly Salter, MD  imipenem-cilastatin 500 mg in sodium chloride 0.9 % 100 mL Inject 500 mg into the vein every 6 (six) hours. 05/21/16   Rolly Salter, MD  metoCLOPramide (REGLAN) 5 MG tablet Take 5-10 mg by mouth every 8 (eight) hours as needed (for hiccups).    Historical Provider, MD  metoprolol tartrate (LOPRESSOR) 25 mg/10 mL SUSP Place 40 mLs (100 mg total) into feeding tube 2 (two) times daily. 05/21/16   Rolly Salter, MD  Nutritional Supplements (FEEDING SUPPLEMENT, JEVITY 1.2 CAL,) LIQD Place 1,000 mLs into feeding tube  continuous. 05/21/16   Rolly Salter, MD  ondansetron (ZOFRAN) 4 MG tablet Take 4 mg by mouth every 8 (eight) hours as needed for nausea or vomiting.    Historical Provider, MD  pantoprazole sodium (PROTONIX) 40 mg/20 mL PACK Place 20 mLs (40 mg total) into feeding tube 2 (two) times daily. 05/21/16   Rolly Salter, MD  polyethylene glycol (MIRALAX / Ethelene Hal) packet Take 17 g by mouth daily. 05/22/16   Rolly Salter, MD    Vital Signs: BP (!) 142/85   Pulse (!) 107   Resp 18   SpO2 95%   Physical Exam: Heart: tachy, but regular Lungs: CTAB Abd: soft, NT, ND, drain site in left flank has completely sealed with no evidence of bleeding or infection.  Imaging: Ct Abdomen Pelvis W Contrast  Result Date: 06/21/2016 CLINICAL DATA:  83 year old male with history of peripancreatic abscess status post drain removal. EXAM: CT ABDOMEN AND PELVIS WITH CONTRAST TECHNIQUE: Multidetector CT imaging of the abdomen and pelvis was performed using the standard protocol following bolus administration of intravenous contrast. CONTRAST:  ISOVUE-300 IOPAMIDOL (ISOVUE-300) INJECTION 61% COMPARISON:  CT the abdomen and pelvis 06/18/2016. FINDINGS: Lower chest: Moderate-sized partially loculated left-sided pleural effusion with some associated passive subsegmental atelectasis in the left lower lobe. Scarring and/or subsegmental atelectasis in the periphery of the right lower lobe. Atherosclerotic calcifications in the left main, left anterior descending, left circumflex and right coronary arteries. Central venous catheter with tip terminating in the right atrium. Hepatobiliary:  Diffuse low attenuation throughout the hepatic parenchyma, compatible with a background of hepatic steatosis. No discrete cystic or solid hepatic lesions. No intra or extrahepatic biliary ductal dilatation. Small partially calcified gallstones lying dependently in the gallbladder. No findings to suggest an acute cholecystitis at this time.  Pancreas: Extensive chronic pancreatic and peripancreatic rim enhancing fluid collections are again noted, many of which have apparent feculent contents, compatible with infected pancreatic pseudocysts. These are regular in shape and therefore difficult to accurately measure, however, these appear grossly stable in size compared to the recent prior examination 06/18/2016. Previously noted drainage catheter has been removed. At this time, the bulkiest portion of these collections is in the anterior aspect of the pancreatic body and head, measuring up to 4.6 x 10.4 cm on axial image 32 of series 2. These collections appear infiltrative throughout the sub peritoneal spaces, extending into the root of the small bowel mesentery, extending into the splenic hilum, involving the gastrosplenic ligament, and extending cephalad at adjacent to the proximal superior mesenteric artery and celiac axis. Spleen: Unremarkable. Adrenals/Urinary Tract: Mild thickening of the left adrenal gland is unchanged. Right adrenal gland is normal in appearance. Multiple subcentimeter low-attenuation lesions are noted in the kidneys bilaterally, too small to definitively characterize, but likely to represent cysts. Tiny calcifications noted in both renal hila may represent nonobstructive calculi or vascular calcifications measuring 1-3 mm in size. No hydroureteronephrosis. Within the dependent portion of the urinary bladder there are 2 calculi, largest of which measures up to 8 mm in diameter (image 83 of series 2). Urinary bladder is otherwise unremarkable in appearance. Stomach/Bowel: The appearance of the stomach is normal. No pathologic dilatation of small bowel or colon. New compared to the prior examination is profound thickening of the terminal ileum which is somewhat mass-like in appearance immediately before the ileocecal valve, and more proximally is very narrowed. This is best appreciated on coronal image 50 of series 5 and axial image  50 of series 2. Numerous colonic diverticulae are noted, without surrounding inflammatory changes to suggest an acute diverticulitis at this time. The appendix is not confidently identified and may be surgically absent. Regardless, there are no inflammatory changes noted adjacent to the cecum to suggest the presence of an acute appendicitis at this time. Vascular/Lymphatic: Aortic atherosclerosis, without evidence of aneurysm or dissection in the abdominal or pelvic vasculature. IVC filter in place with tip at the inferior endplate of L1 below the level of the renal veins. Reproductive: Prostate gland and seminal vesicles are unremarkable in appearance. Other: No significant volume of ascites.  No pneumoperitoneum. Musculoskeletal: There are no aggressive appearing lytic or blastic lesions noted in the visualized portions of the skeleton. IMPRESSION: 1. Status post removal of drainage catheter from what appears to be infected pancreatic/peripancreatic pseudocysts. The pseudocysts are essentially unchanged in size on today's examination, as discussed above. 2. New compared to the prior study is marked narrowing of the distal ileum and profound thickening of the terminal ileum immediately before the ileocecal valve. Although this is somewhat mass-like in appearance, this is presumably infectious/inflammatory (rather than neoplastic) given the rapid development compared to the examination from 06/18/2016. Clinical correlation for signs and symptoms of ileitis is recommended. 3. Tiny calcifications measuring 1-3 mm in the renal hila bilaterally may be vascular or may represent nonobstructive calculi. There are no ureteral stones. However, there are 2 large bladder calculi measuring up to 8 mm. 4. Hepatic steatosis. 5. Cholelithiasis without evidence of acute cholecystitis at this time. 6.  Colonic diverticulosis without evidence of acute diverticulitis at this time. 7. Previously noted moderate left-sided pleural  effusion appears slightly larger and is now loculated. 8. Additional incidental findings, as above. Electronically Signed   By: Trudie Reed M.D.   On: 06/21/2016 16:18    Labs:  CBC:  Recent Labs  06/04/16 0500 06/09/16 0515 06/17/16 0648 06/23/16 0550  WBC 11.3* 10.1 9.7 14.3*  HGB 11.2* 11.2* 10.9* 10.6*  HCT 35.5* 35.3* 33.5* 31.0*  PLT 328 251 252 238    COAGS:  Recent Labs  05/03/16 0242 05/12/16 1415 06/04/16 0500 06/23/16 0550  INR 1.43 1.28 1.15 1.55    BMP:  Recent Labs  06/04/16 0500 06/09/16 0515 06/17/16 0648 06/18/16 0600 06/19/16 0500 06/23/16 0550  NA 140 136 139  --   --  132*  K 3.7 3.6 2.9* 3.4* 3.6 3.4*  CL 103 101 102  --   --  99*  CO2 33* 29 28  --   --  24  GLUCOSE 124* 179* 127*  --   --  123*  BUN 19 18 8   --   --  15  CALCIUM 8.9 8.7* 8.7*  --   --  8.4*  CREATININE 0.75 0.68 0.76  --   --  0.89  GFRNONAA >60 >60 >60  --   --  >60  GFRAA >60 >60 >60  --   --  >60    LIVER FUNCTION TESTS:  Recent Labs  05/22/16 0635 05/29/16 0545 06/17/16 0648 06/23/16 0550  BILITOT 1.0 0.5 1.0 1.3*  AST 26 34 20 14*  ALT 28 42 17 12*  ALKPHOS 147* 104 74 66  PROT 5.5* 5.8* 6.1* 5.9*  ALBUMIN 1.6* 1.7* 2.2* 2.0*   Mallampati: 2  Assessment and Plan: 1. Infected pancreatic pseudocyst, s/p perc drain on 11/28, s/p removal on 06/19/16. -the patient currently looks well.  He has had a repeat CT scan since his drain has been pulled that reveals no change in what remains of his pancreatic pseudocyst.  He is afebrile and has no abdominal pain consistent with pancreatitis. -his WBC has trended up slightly, but he has a new finding on his CT scan that reveals possible infectious/inflammatory changes to his TI that may be contributing to this.  His pancreatic process may as well, but he has no other clinical signs that a new drain is needed at this time. -if he were to develop worsening pain in his epigastrium, fevers over 101, erythema of  old drain site, N/V, or worsening WBC with CT evidence of worsening pancreatic changes, then he may need a replacement of his drain.  For now, we do not feel a new drain is necessary.  ADDENDUM: after further discussion with the primary physician and Dr. Bonnielee Haff, we WILL proceed with an attempt to replace a drain in this patient.  Apparently he has started running some low grade temps in the 100s and has now been placed on Cipro/Flagyl.  He has been prepared for tomorrow, 1/9.  Electronically Signed: Letha Cape 06/23/2016, 9:40 AM   I spent a total of 25 Minutes at the the patient's bedside AND on the patient's hospital floor or unit, greater than 50% of which was counseling/coordinating care for infected pancreatic pseudocyst

## 2016-06-24 ENCOUNTER — Other Ambulatory Visit (HOSPITAL_COMMUNITY): Payer: Medicare Other

## 2016-06-24 HISTORY — PX: IR GENERIC HISTORICAL: IMG1180011

## 2016-06-24 LAB — PROTIME-INR
INR: 1.67
Prothrombin Time: 19.9 seconds — ABNORMAL HIGH (ref 11.4–15.2)

## 2016-06-24 MED ORDER — MIDAZOLAM HCL 2 MG/2ML IJ SOLN
INTRAMUSCULAR | Status: AC
  Filled 2016-06-24: qty 2

## 2016-06-24 MED ORDER — FENTANYL CITRATE (PF) 100 MCG/2ML IJ SOLN
INTRAMUSCULAR | Status: AC | PRN
  Administered 2016-06-24 (×2): 50 ug via INTRAVENOUS

## 2016-06-24 MED ORDER — LIDOCAINE HCL 1 % IJ SOLN
INTRAMUSCULAR | Status: AC
  Filled 2016-06-24: qty 20

## 2016-06-24 MED ORDER — LIDOCAINE-EPINEPHRINE 1 %-1:100000 IJ SOLN
INTRAMUSCULAR | Status: AC
  Filled 2016-06-24: qty 1

## 2016-06-24 MED ORDER — FENTANYL CITRATE (PF) 100 MCG/2ML IJ SOLN
INTRAMUSCULAR | Status: AC
  Filled 2016-06-24: qty 2

## 2016-06-24 MED ORDER — IOPAMIDOL (ISOVUE-300) INJECTION 61%
INTRAVENOUS | Status: AC
  Administered 2016-06-24: 5 mL
  Filled 2016-06-24: qty 50

## 2016-06-24 MED ORDER — MIDAZOLAM HCL 2 MG/2ML IJ SOLN
INTRAMUSCULAR | Status: AC | PRN
  Administered 2016-06-24 (×2): 1 mg via INTRAVENOUS

## 2016-06-24 NOTE — Sedation Documentation (Signed)
Patient is resting comfortably. 

## 2016-06-24 NOTE — Procedures (Signed)
Technically successful CT guided placed of a 10 Fr drainage catheter placement into the pancreatic pseudocyst yielding 10 cc of purulent, slightly bloody fluid.   All aspirated samples sent to the laboratory for analysis.   EBL: None No immediate post procedural complications.   Katherina RightJay Martia Dalby, MD Pager #: [phone]

## 2016-06-24 NOTE — Sedation Documentation (Signed)
MD in room-  Starting in CT

## 2016-06-24 NOTE — Sedation Documentation (Signed)
Drain to JP bulb L side

## 2016-06-24 NOTE — Sedation Documentation (Signed)
Continue with procedure in CT per MD

## 2016-06-24 NOTE — Sedation Documentation (Signed)
In CT for rest of procedure

## 2016-06-25 ENCOUNTER — Encounter: Payer: Self-pay | Admitting: Geriatric Medicine

## 2016-06-25 LAB — CBC
HEMATOCRIT: 33.3 % — AB (ref 39.0–52.0)
HEMOGLOBIN: 9.9 g/dL — AB (ref 13.0–17.0)
MCH: 27.3 pg (ref 26.0–34.0)
MCHC: 29.7 g/dL — ABNORMAL LOW (ref 30.0–36.0)
MCV: 91.7 fL (ref 78.0–100.0)
PLATELETS: 193 10*3/uL (ref 150–400)
RBC: 3.63 MIL/uL — ABNORMAL LOW (ref 4.22–5.81)
RDW: 17.1 % — ABNORMAL HIGH (ref 11.5–15.5)
WBC: 8 10*3/uL (ref 4.0–10.5)

## 2016-06-25 LAB — BASIC METABOLIC PANEL
Anion gap: 7 (ref 5–15)
BUN: 12 mg/dL (ref 6–20)
CHLORIDE: 105 mmol/L (ref 101–111)
CO2: 22 mmol/L (ref 22–32)
Calcium: 8 mg/dL — ABNORMAL LOW (ref 8.9–10.3)
Creatinine, Ser: 0.89 mg/dL (ref 0.61–1.24)
GFR calc Af Amer: >60 mL/min (ref >60)
GFR calc non Af Amer: >60 mL/min (ref >60)
GLUCOSE: 126 mg/dL — AB (ref 65–99)
POTASSIUM: 4 mmol/L (ref 3.5–5.1)
Sodium: 134 mmol/L — ABNORMAL LOW (ref 135–145)

## 2016-06-25 LAB — MAGNESIUM: Magnesium: 1.4 mg/dL — ABNORMAL LOW (ref 1.7–2.4)

## 2016-06-25 NOTE — Progress Notes (Signed)
Patient ID: Colin West, male   DOB: January 26, 1934, 83 y.o.   MRN: [id number]    Referring Physician(s): Dr. Carron CurieAli Hijazi  Supervising Physician: Ruel FavorsShick, Trevor  [facility]  Chief Complaint: Pancreatic pseudocyst  Subjective: Patient feels weak today, but otherwise no complaints  Allergies: Patient has no known allergies.  Medications: Prior to Admission medications   Medication Sig Start Date End Date Taking? Authorizing Provider  chlorpheniramine-HYDROcodone (TUSSIONEX) 10-8 MG/5ML SUER Take 5 mLs by mouth every 12 (twelve) hours as needed for cough. 05/21/16   Rolly SalterPranav M Patel, MD  docusate (COLACE) 50 MG/5ML liquid Place 5 mLs (50 mg total) into feeding tube daily. 05/22/16   Rolly SalterPranav M Patel, MD  furosemide (LASIX) 40 MG tablet Take 1 tablet (40 mg total) by mouth daily. 05/21/16   Rolly SalterPranav M Patel, MD  guaiFENesin-dextromethorphan (ROBITUSSIN DM) 100-10 MG/5ML syrup Take 5 mLs by mouth 4 (four) times daily. 05/21/16   Rolly SalterPranav M Patel, MD  imipenem-cilastatin 500 mg in sodium chloride 0.9 % 100 mL Inject 500 mg into the vein every 6 (six) hours. 05/21/16   Rolly SalterPranav M Patel, MD  metoCLOPramide (REGLAN) 5 MG tablet Take 5-10 mg by mouth every 8 (eight) hours as needed (for hiccups).    Historical Provider, MD  metoprolol tartrate (LOPRESSOR) 25 mg/10 mL SUSP Place 40 mLs (100 mg total) into feeding tube 2 (two) times daily. 05/21/16   Rolly SalterPranav M Patel, MD  Nutritional Supplements (FEEDING SUPPLEMENT, JEVITY 1.2 CAL,) LIQD Place 1,000 mLs into feeding tube continuous. 05/21/16   Rolly SalterPranav M Patel, MD  ondansetron (ZOFRAN) 4 MG tablet Take 4 mg by mouth every 8 (eight) hours as needed for nausea or vomiting.    Historical Provider, MD  pantoprazole sodium (PROTONIX) 40 mg/20 mL PACK Place 20 mLs (40 mg total) into feeding tube 2 (two) times daily. 05/21/16   Rolly SalterPranav M Patel, MD  polyethylene glycol (MIRALAX / Ethelene HalGLYCOLAX) packet Take 17 g by mouth daily. 05/22/16   Rolly SalterPranav M Patel, MD     Vital Signs: BP (!) 102/52   Pulse 99   Resp 18   SpO2 95% Comment: R AIR  Physical Exam: Abd: left upper quadrant/flank drain in place again with chocolate milk type output.  Approximately 20cc of output in the drain right now.  Drain site is c/d/i  Imaging: Ct Abdomen Pelvis W Contrast  Result Date: 06/21/2016 CLINICAL DATA:  83 year old male with history of peripancreatic abscess status post drain removal. EXAM: CT ABDOMEN AND PELVIS WITH CONTRAST TECHNIQUE: Multidetector CT imaging of the abdomen and pelvis was performed using the standard protocol following bolus administration of intravenous contrast. CONTRAST:  100mL ISOVUE-300 IOPAMIDOL (ISOVUE-300) INJECTION 61% COMPARISON:  CT the abdomen and pelvis 06/18/2016. FINDINGS: Lower chest: Moderate-sized partially loculated left-sided pleural effusion with some associated passive subsegmental atelectasis in the left lower lobe. Scarring and/or subsegmental atelectasis in the periphery of the right lower lobe. Atherosclerotic calcifications in the left main, left anterior descending, left circumflex and right coronary arteries. Central venous catheter with tip terminating in the right atrium. Hepatobiliary: Diffuse low attenuation throughout the hepatic parenchyma, compatible with a background of hepatic steatosis. No discrete cystic or solid hepatic lesions. No intra or extrahepatic biliary ductal dilatation. Small partially calcified gallstones lying dependently in the gallbladder. No findings to suggest an acute cholecystitis at this time. Pancreas: Extensive chronic pancreatic and peripancreatic rim enhancing fluid collections are again noted, many of which have apparent feculent contents, compatible with infected pancreatic  pseudocysts. These are regular in shape and therefore difficult to accurately measure, however, these appear grossly stable in size compared to the recent prior examination 06/18/2016. Previously noted drainage catheter  has been removed. At this time, the bulkiest portion of these collections is in the anterior aspect of the pancreatic body and head, measuring up to 4.6 x 10.4 cm on axial image 32 of series 2. These collections appear infiltrative throughout the sub peritoneal spaces, extending into the root of the small bowel mesentery, extending into the splenic hilum, involving the gastrosplenic ligament, and extending cephalad at adjacent to the proximal superior mesenteric artery and celiac axis. Spleen: Unremarkable. Adrenals/Urinary Tract: Mild thickening of the left adrenal gland is unchanged. Right adrenal gland is normal in appearance. Multiple subcentimeter low-attenuation lesions are noted in the kidneys bilaterally, too small to definitively characterize, but likely to represent cysts. Tiny calcifications noted in both renal hila may represent nonobstructive calculi or vascular calcifications measuring 1-3 mm in size. No hydroureteronephrosis. Within the dependent portion of the urinary bladder there are 2 calculi, largest of which measures up to 8 mm in diameter (image 83 of series 2). Urinary bladder is otherwise unremarkable in appearance. Stomach/Bowel: The appearance of the stomach is normal. No pathologic dilatation of small bowel or colon. New compared to the prior examination is profound thickening of the terminal ileum which is somewhat mass-like in appearance immediately before the ileocecal valve, and more proximally is very narrowed. This is best appreciated on coronal image 50 of series 5 and axial image 50 of series 2. Numerous colonic diverticulae are noted, without surrounding inflammatory changes to suggest an acute diverticulitis at this time. The appendix is not confidently identified and may be surgically absent. Regardless, there are no inflammatory changes noted adjacent to the cecum to suggest the presence of an acute appendicitis at this time. Vascular/Lymphatic: Aortic atherosclerosis, without  evidence of aneurysm or dissection in the abdominal or pelvic vasculature. IVC filter in place with tip at the inferior endplate of L1 below the level of the renal veins. Reproductive: Prostate gland and seminal vesicles are unremarkable in appearance. Other: No significant volume of ascites.  No pneumoperitoneum. Musculoskeletal: There are no aggressive appearing lytic or blastic lesions noted in the visualized portions of the skeleton. IMPRESSION: 1. Status post removal of drainage catheter from what appears to be infected pancreatic/peripancreatic pseudocysts. The pseudocysts are essentially unchanged in size on today's examination, as discussed above. 2. New compared to the prior study is marked narrowing of the distal ileum and profound thickening of the terminal ileum immediately before the ileocecal valve. Although this is somewhat mass-like in appearance, this is presumably infectious/inflammatory (rather than neoplastic) given the rapid development compared to the examination from 06/18/2016. Clinical correlation for signs and symptoms of ileitis is recommended. 3. Tiny calcifications measuring 1-3 mm in the renal hila bilaterally may be vascular or may represent nonobstructive calculi. There are no ureteral stones. However, there are 2 large bladder calculi measuring up to 8 mm. 4. Hepatic steatosis. 5. Cholelithiasis without evidence of acute cholecystitis at this time. 6. Colonic diverticulosis without evidence of acute diverticulitis at this time. 7. Previously noted moderate left-sided pleural effusion appears slightly larger and is now loculated. 8. Additional incidental findings, as above. Electronically Signed   By: Trudie Reed M.D.   On: 06/21/2016 16:18   Ct Image Guided Drainage By Percutaneous Catheter  Result Date: 06/24/2016 INDICATION: History of pancreatic pseudocysts, post CT-guided percutaneous drainage catheter placement on 05/13/2016. The  percutaneous drainage catheter was  subsequently removed secondary to decreased output, however patient developed recurrent low-grade fevers following drainage catheter removal with repeat CT scan demonstrating a residual complex peripancreatic abscess on CT scan performed 04/22/2015. As such, request made for attempted CT-guided percutaneous drainage catheter placement. Note, initial attempt was made to recannulate the track of the percutaneous drainage catheter in the IR suite however this proved difficult and the patient was brought to CT for attempted CT-guided percutaneous drain placement. EXAM: CT IMAGE GUIDED DRAINAGE BY PERCUTANEOUS CATHETER COMPARISON:  CT abdomen pelvis - 04/21/2017; 01/03/20168 ; 05/09/2016; CT-guided percutaneous drainage catheter placement -05/13/2016 MEDICATIONS: The patient is currently admitted to the hospital and receiving intravenous antibiotics. The antibiotics were administered within an appropriate time frame prior to the initiation of the procedure. ANESTHESIA/SEDATION: Moderate (conscious) sedation was employed during this procedure. A total of Versed 2 mg and Fentanyl 100 mcg was administered intravenously. Moderate Sedation Time: 20 minutes. The patient's level of consciousness and vital signs were monitored continuously by radiology nursing throughout the procedure under my direct supervision. CONTRAST:  None COMPLICATIONS: None immediate. PROCEDURE: Informed written consent was obtained from the patient after a discussion of the risks, benefits and alternatives to treatment. The patient was placed supine on the CT gantry and a pre procedural CT was performed re-demonstrating the known abscess/fluid collection within the pancreatic bed with dominant ill-defined component about the pancreatic tail measuring approximately 4.1 x 2.6 cm (image 42, series 2). A small amount of contrast from attempted fluoroscopic guided percutaneous drainage catheter placement is seen within the loculated left-sided pleural  effusion (representative image 9, series 2). The procedure was planned. A timeout was performed prior to the initiation of the procedure. The skin overlying the left lateral abdomen was prepped and draped in the usual sterile fashion. The overlying soft tissues were anesthetized with 1% lidocaine with epinephrine. Appropriate trajectory was planned with the use of a 22 gauge spinal needle. An 18 gauge trocar needle was advanced into the abscess/fluid collection and a short Amplatz super stiff wire was coiled within the collection. Appropriate positioning was confirmed with a limited CT scan. The tract was serially dilated allowing placement of a 10 [place] all-purpose drainage catheter. Appropriate positioning was confirmed with a limited postprocedural CT scan. Approximately 10 cc of purulent, slightly blood tinged fluid was aspirated. The tube was connected to a JP bulb and sutured in place. A dressing was placed. The patient tolerated the procedure well without immediate post procedural complication. IMPRESSION: Successful CT guided placement of a 10 French all purpose drain catheter into the residual pancreatic pseudocysts with aspiration of 10 mL of purulent, slightly blood tinged fluid. Samples were sent to the laboratory as requested by the ordering clinical team. Electronically Signed   By: Simonne Come M.D.   On: 06/24/2016 16:53    Labs:  CBC:  Recent Labs  06/09/16 0515 06/17/16 0648 06/23/16 0550 06/25/16 0700  WBC 10.1 9.7 14.3* 8.0  HGB 11.2* 10.9* 10.6* 9.9*  HCT 35.3* 33.5* 31.0* 33.3*  PLT 251 252 238 193    COAGS:  Recent Labs  05/12/16 1415 06/04/16 0500 06/23/16 0550 06/24/16 0705  INR 1.28 1.15 1.55 1.67    BMP:  Recent Labs  06/04/16 0500 06/09/16 0515 06/17/16 0648 06/18/16 0600 06/19/16 0500 06/23/16 0550  NA 140 136 139  --   --  132*  K 3.7 3.6 2.9* 3.4* 3.6 3.4*  CL 103 101 102  --   --  99*  CO2 33* 29 28  --   --  24  GLUCOSE 124* 179* 127*  --    --  123*  BUN 19 18 8   --   --  15  CALCIUM 8.9 8.7* 8.7*  --   --  8.4*  CREATININE 0.75 0.68 0.76  --   --  0.89  GFRNONAA >60 >60 >60  --   --  >60  GFRAA >60 >60 >60  --   --  >60    LIVER FUNCTION TESTS:  Recent Labs  05/22/16 0635 05/29/16 0545 06/17/16 0648 06/23/16 0550  BILITOT 1.0 0.5 1.0 1.3*  AST 26 34 20 14*  ALT 28 42 17 12*  ALKPHOS 147* 104 74 66  PROT 5.5* 5.8* 6.1* 5.9*  ALBUMIN 1.6* 1.7* 2.2* 2.0*    Assessment and Plan: 1. Pancreatic pseudocyst, s/p drain replacement on 1/9 -Watts -drain is doing well and draining pancreatic fluid type output. -this drain needs to be flushed twice a day with 5cc of NS to avoid clogging like his last drain. -I have sent the order for his follow up in 2 weeks with drain clinic for repeat CT scan.  Electronically Signed: Letha Cape 06/25/2016, 9:44 AM   I spent a total of 15 Minutes at the the patient's bedside AND on the patient's hospital floor or unit, greater than 50% of which was counseling/coordinating care for pancreatic pseudocyst

## 2016-06-26 ENCOUNTER — Encounter (HOSPITAL_COMMUNITY): Payer: Self-pay | Admitting: Interventional Radiology

## 2016-06-26 ENCOUNTER — Other Ambulatory Visit (HOSPITAL_COMMUNITY): Payer: Medicare Other

## 2016-06-27 ENCOUNTER — Other Ambulatory Visit: Payer: Self-pay | Admitting: Surgery

## 2016-06-27 DIAGNOSIS — K859 Acute pancreatitis without necrosis or infection, unspecified: Secondary | ICD-10-CM

## 2016-06-29 LAB — AEROBIC/ANAEROBIC CULTURE W GRAM STAIN (SURGICAL/DEEP WOUND)

## 2016-06-29 LAB — AEROBIC/ANAEROBIC CULTURE (SURGICAL/DEEP WOUND)

## 2016-07-10 ENCOUNTER — Ambulatory Visit
Admission: RE | Admit: 2016-07-10 | Discharge: 2016-07-10 | Disposition: A | Discharge status: Home / Self Care | Payer: Medicare Other | Source: Ambulatory Visit | Attending: General Surgery | Admitting: General Surgery

## 2016-07-10 ENCOUNTER — Ambulatory Visit
Admission: RE | Admit: 2016-07-10 | Discharge: 2016-07-10 | Disposition: A | Discharge status: Home / Self Care | Payer: Medicare Other | Source: Ambulatory Visit | Attending: Surgery | Admitting: Surgery

## 2016-07-10 ENCOUNTER — Encounter: Payer: Self-pay | Admitting: *Deleted

## 2016-07-10 DIAGNOSIS — K859 Acute pancreatitis without necrosis or infection, unspecified: Secondary | ICD-10-CM

## 2016-07-10 HISTORY — PX: IR GENERIC HISTORICAL: IMG1180011

## 2016-07-10 MED ORDER — IOPAMIDOL (ISOVUE-300) INJECTION 61%
80.0000 mL | Freq: Once | INTRAVENOUS | Status: AC | PRN
  Administered 2016-07-10: 80 mL via INTRAVENOUS

## 2016-07-10 NOTE — Progress Notes (Signed)
Referring Physician(s): Hijazi,A/Newman,D  Chief Complaint: The patient is seen in follow up today s/p drainage of residual pancreatic pseudocyst on 06/24/16.  History of present illness: Colin West is an 83 year old male with history of hemorrhagic pancreatitis / pancreatic pseudocysts and prior CT-guided drainage on 05/13/16. The catheter was subsequently removed secondary to decreased output, however he developed recurrent low-grade fevers following drain  removal and repeat CT demonstrated residual complex peripancreatic abscess. Repeat drainage was ordered and initial attempts at recannulization of the previous tract via fluoroscopic guidance was unsuccessful. He was subsequently taken for CT-guided drain placement on 06/24/16 into the left lateral residual pancreatic pseudocyst collection. He presents again today for follow-up CT to assess adequacy of drainage. He currently reports no fevers, chills, headaches, chest pain, dyspnea, worsening abdominal or back pain, nausea ,vomiting. He does have diminished appetite. The catheter is currently being flushed 1-2 times per day. Output has been fairly insignificant. He is currently on oral antibiotic therapy. Prior cultures from the pseudocyst drain grew Escherichia coli.   Past Medical History:  Diagnosis Date  . Anxiety   . Carotid artery stenosis    a. < 40% bilateral stenosis by [place] in 2014  . Depression    denies  . Diabetes mellitus    denies  . History of kidney stones   . Hyperlipidemia   . Hypertension     Past Surgical History:  Procedure Laterality Date  . ELBOW ARTHROSCOPY Right   . INGUINAL HERNIA REPAIR Left Sept 2006   Dr Corliss Skains  . INGUINAL HERNIA REPAIR Bilateral 06/01/2013   Procedure: LAPAROSCOPIC EXPLORATION BILATERAL INGUINAL HERNIA REPAIR;  Surgeon: Ardeth Sportsman, MD;  Location: [facility] OR;  Service: General;  Laterality: Bilateral;  Left Femoral, Left Recurrent inguinal, Right Inguinal  . INSERTION OF MESH Bilateral  06/01/2013   Procedure: INSERTION OF MESH;  Surgeon: Ardeth Sportsman, MD;  Location: [facility] OR;  Service: General;  Laterality: Bilateral;  Left Femoral, Left Recurrent inguinal, Right Inguinal  . IR GENERIC HISTORICAL  06/04/2016   IR IVC FILTER PLMT / S&I Lenise Arena GUID/MOD SED 06/04/2016 Oley Balm, MD [facility]  . IR GENERIC HISTORICAL  06/24/2016   IR FLUORO RM 30-60 MIN 06/24/2016 Simonne Come, MD [facility]  . LAPAROSCOPIC LYSIS OF ADHESIONS Bilateral 06/01/2013   Procedure: LAPAROSCOPIC LYSIS OF ADHESIONS;  Surgeon: Ardeth Sportsman, MD;  Location: [facility] OR;  Service: General;  Laterality: Bilateral;  . LUNG REMOVAL, PARTIAL  Sept. 2011    Dr. Edwyna Shell  . ROTATOR CUFF REPAIR     Bilateral repair    Allergies: Patient has no known allergies.  Medications: Prior to Admission medications   Medication Sig Start Date End Date Taking? Authorizing Provider  chlorpheniramine-HYDROcodone (TUSSIONEX) 10-8 MG/5ML SUER Take 5 mLs by mouth every 12 (twelve) hours as needed for cough. 05/21/16   Rolly Salter, MD  docusate (COLACE) 50 MG/5ML liquid Place 5 mLs (50 mg total) into feeding tube daily. 05/22/16   Rolly Salter, MD  furosemide (LASIX) 40 MG tablet Take 1 tablet (40 mg total) by mouth daily. 05/21/16   Rolly Salter, MD  guaiFENesin-dextromethorphan (ROBITUSSIN DM) 100-10 MG/5ML syrup Take 5 mLs by mouth 4 (four) times daily. 05/21/16   Rolly Salter, MD  imipenem-cilastatin 500 mg in sodium chloride 0.9 % 100 mL Inject 500 mg into the vein every 6 (six) hours. 05/21/16   Rolly Salter, MD  metoCLOPramide (REGLAN) 5 MG tablet Take 5-10 mg by mouth every  8 (eight) hours as needed (for hiccups).    Historical Provider, MD  metoprolol tartrate (LOPRESSOR) 25 mg/10 mL SUSP Place 40 mLs (100 mg total) into feeding tube 2 (two) times daily. 05/21/16   Rolly Salter, MD  Nutritional Supplements (FEEDING SUPPLEMENT, JEVITY 1.2 CAL,) LIQD Place 1,000 mLs into feeding tube continuous. 05/21/16   Rolly Salter, MD  ondansetron (ZOFRAN) 4 MG tablet Take 4 mg by mouth every 8 (eight) hours as needed for nausea or vomiting.    Historical Provider, MD  pantoprazole sodium (PROTONIX) 40 mg/20 mL PACK Place 20 mLs (40 mg total) into feeding tube 2 (two) times daily. 05/21/16   Rolly Salter, MD  polyethylene glycol (MIRALAX / Ethelene Hal) packet Take 17 g by mouth daily. 05/22/16   Rolly Salter, MD     Family History  Problem Relation Age of Onset  . Heart disease Brother     Heart Disease before age 38  . Heart attack Brother     Social History   Social History  . Marital status: Divorced    Spouse name: N/A  . Number of children: N/A  . Years of education: N/A   Occupational History  .      Social History Main Topics  . Smoking status: Former Smoker    Quit date: 05/31/1973  . Smokeless tobacco: Never Used  . Alcohol use No  . Drug use: No  . Sexual activity: Not on file   Other Topics Concern  . Not on file   Social History Narrative  . No narrative on file     Vital Signs: BP 114/66 (BP Location: Left Arm, Patient Position: Sitting, Cuff Size: Normal)   Pulse 82   Temp 98 F (36.7 C) (Oral)   Resp 15   SpO2 96%   Physical Exam  Awake and alert. Heart is RRR. Lungs are clear to auscultation bilaterally. Abdomen is soft, nontender, and nondistended. Bowel sounds are intact. Left lateral abdominal drain is intact with minimal drainage. Drain site is clean, dry, and without tenderness.   Imaging: No results found.  Labs:  CBC:  Recent Labs  06/09/16 0515 06/17/16 0648 06/23/16 0550 06/25/16 0700  WBC 10.1 9.7 14.3* 8.0  HGB 11.2* 10.9* 10.6* 9.9*  HCT 35.3* 33.5* 31.0* 33.3*  PLT 251 252 238 193    COAGS:  Recent Labs  05/12/16 1415 06/04/16 0500 06/23/16 0550 06/24/16 0705  INR 1.28 1.15 1.55 1.67    BMP:  Recent Labs  06/09/16 0515 06/17/16 0648 06/18/16 0600 06/19/16 0500 06/23/16 0550 06/25/16 1927  NA 136 139  --   --  132* 134*    K 3.6 2.9* 3.4* 3.6 3.4* 4.0  CL 101 102  --   --  99* 105  CO2 29 28  --   --  24 22  GLUCOSE 179* 127*  --   --  123* 126*  BUN 18 8  --   --  15 12  CALCIUM 8.7* 8.7*  --   --  8.4* 8.0*  CREATININE 0.68 0.76  --   --  0.89 0.89  GFRNONAA >60 >60  --   --  >60 >60  GFRAA >60 >60  --   --  >60 >60    LIVER FUNCTION TESTS:  Recent Labs  05/22/16 0635 05/29/16 0545 06/17/16 0648 06/23/16 0550  BILITOT 1.0 0.5 1.0 1.3*  AST 26 34 20 14*  ALT 28 42 17 12*  ALKPHOS 147* 104 74 66  PROT 5.5* 5.8* 6.1* 5.9*  ALBUMIN 1.6* 1.7* 2.2* 2.0*    Assessment: Patient with history of hemorrhagic pancreatitis/pseudocysts formation, status post drainage of a left lateral pancreatic pseudocyst on 06/24/16. Drain fluid cultures grew Escherichia coli. Patient currently on antibiotic therapy. Drain output has been fairly minimal per patient. Drain is being flushed twice daily. He has been afebrile. Follow-up CT today reveals stable findings with no new fluid collections. Drain appears to be in stable location at this time. Plan at this time is for outpatient follow-up with Dr. Ezzard StandingNewman of [facility] for further management prior to any additional IR intervention. Recommend that patient continue with drain irrigation and daily recording of output. He was told to contact our service if he has any additional questions regarding drain management.   Signed: D. Jeananne RamaKevin Keasia Dubose 07/10/2016, 11:00 AM   Please refer to Dr. Kirt BoysWatt's attestation of this note for management and plan.      Patient ID: Colin West, male   DOB: 1934-04-15, 83 y.o.   MRN: [id number]

## 2016-07-17 DEATH — deceased

## 2016-09-03 ENCOUNTER — Other Ambulatory Visit (HOSPITAL_COMMUNITY): Payer: Self-pay | Admitting: Interventional Radiology

## 2016-09-03 DIAGNOSIS — O2230 Deep phlebothrombosis in pregnancy, unspecified trimester: Secondary | ICD-10-CM

## 2018-01-19 IMAGING — DX DG CHEST 2V
2 series · 2 of 2 positions shown · non-contrast
Comparison: 04/26/2016; 03/28/2016; 05/31/2013 ; CT abdomen pelvis
- 04/22/2016

CLINICAL DATA: Weakness and fever

EXAM:
CHEST  2 VIEW

[x chest ap]
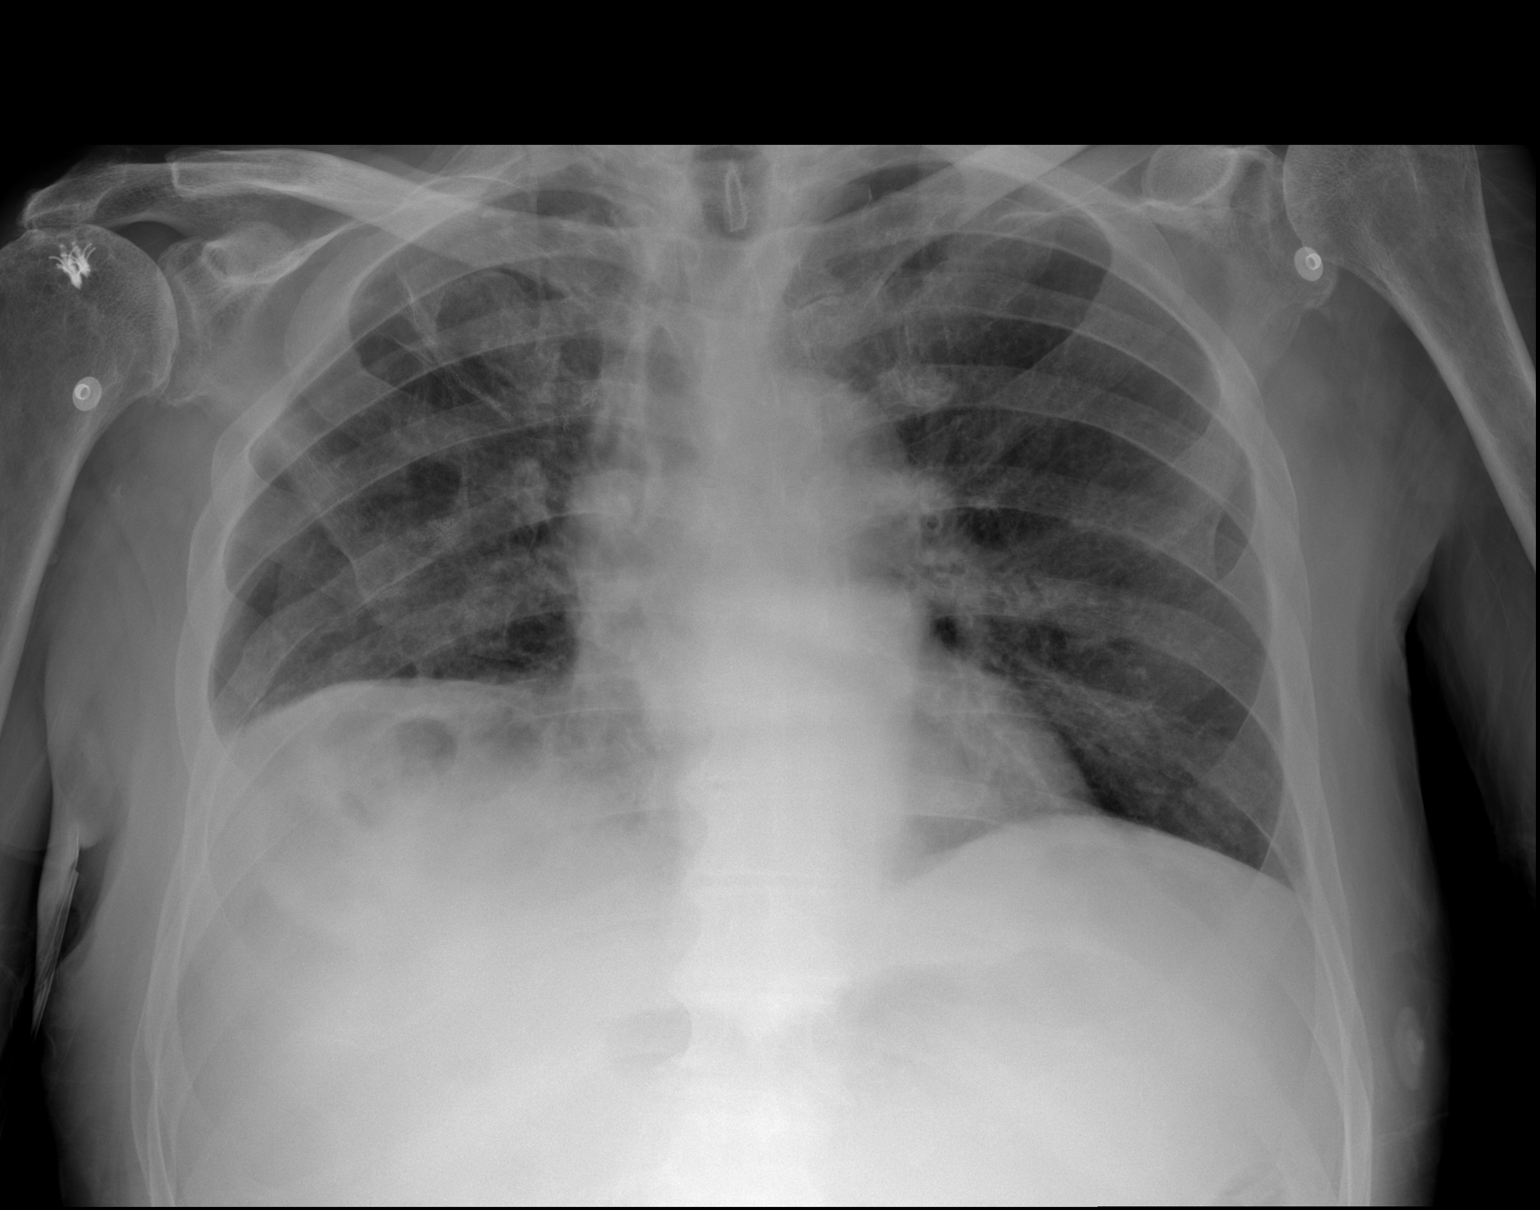

[w chest lat]
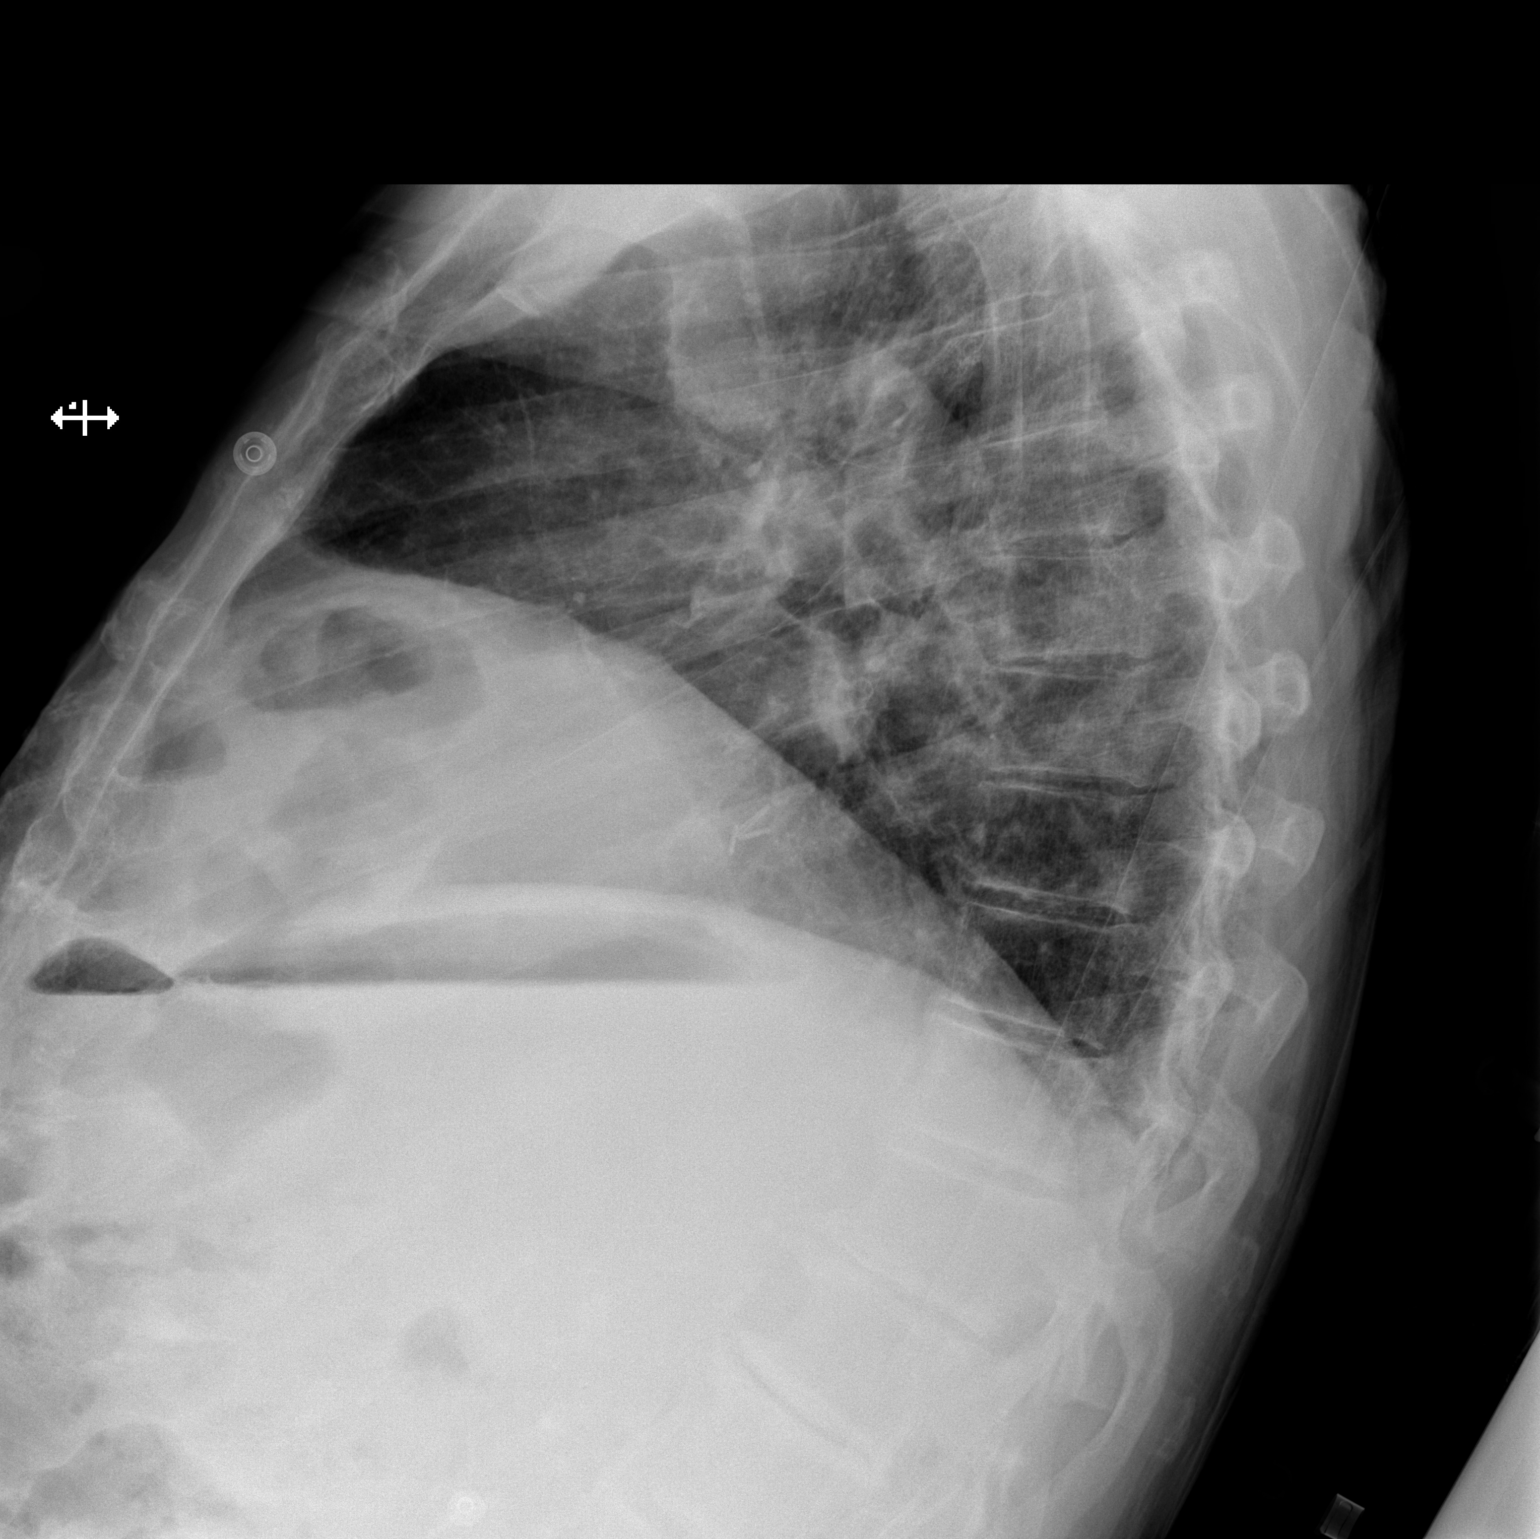

[2 of 2 positions shown; findings below may reference images not displayed]

FINDINGS: Grossly unchanged cardiac silhouette and mediastinal contours with
atherosclerotic plaque within thoracic aorta. Lung volumes were
reduced. Stable postsurgical change of the right upper lung with
volume loss and elevation involving the anterior aspect the right
hemidiaphragm. Note is again made of a interposed loop of hepatic
flexure of the colon below the right hemidiaphragm as seen on prior
abdominal CT. Grossly unchanged mild diffuse slightly nodular
thickening of the pulmonary interstitium. No discrete focal airspace
opacities. No pleural effusion or pneumothorax. No evidence of
edema. No acute osseus abnormalities. Post right rotator cuff
repair. Degenerative change within the lower thoracic and upper
lumbar spine.
IMPRESSION: 1. Similar findings of decreased lung volumes without acute
cardiopulmonary disease. No new focal airspace opacities to suggest
pneumonia.
2. Stable postsurgical change of the right upper lung with
associated volume loss.
